# Patient Record
Sex: Female | Born: 1935 | Race: White | Hispanic: No | Smoking: Former smoker
Health system: Southern US, Community
[De-identification: ages and names within clinical notes are randomized; demographics above are authoritative.]

## PROBLEM LIST (undated history)

## (undated) DIAGNOSIS — R42 Dizziness and giddiness: Secondary | ICD-10-CM

## (undated) DIAGNOSIS — B019 Varicella without complication: Secondary | ICD-10-CM

## (undated) DIAGNOSIS — E079 Disorder of thyroid, unspecified: Secondary | ICD-10-CM

## (undated) DIAGNOSIS — M5136 Other intervertebral disc degeneration, lumbar region: Secondary | ICD-10-CM

## (undated) DIAGNOSIS — R011 Cardiac murmur, unspecified: Secondary | ICD-10-CM

## (undated) DIAGNOSIS — M5412 Radiculopathy, cervical region: Secondary | ICD-10-CM

## (undated) DIAGNOSIS — G5600 Carpal tunnel syndrome, unspecified upper limb: Secondary | ICD-10-CM

## (undated) DIAGNOSIS — I00 Rheumatic fever without heart involvement: Secondary | ICD-10-CM

## (undated) DIAGNOSIS — H409 Unspecified glaucoma: Secondary | ICD-10-CM

## (undated) DIAGNOSIS — J189 Pneumonia, unspecified organism: Secondary | ICD-10-CM

## (undated) DIAGNOSIS — Z860100 Personal history of colon polyps, unspecified: Secondary | ICD-10-CM

## (undated) DIAGNOSIS — R55 Syncope and collapse: Secondary | ICD-10-CM

## (undated) DIAGNOSIS — I341 Nonrheumatic mitral (valve) prolapse: Secondary | ICD-10-CM

## (undated) DIAGNOSIS — E039 Hypothyroidism, unspecified: Secondary | ICD-10-CM

## (undated) DIAGNOSIS — M1712 Unilateral primary osteoarthritis, left knee: Secondary | ICD-10-CM

## (undated) DIAGNOSIS — Z9181 History of falling: Secondary | ICD-10-CM

## (undated) DIAGNOSIS — M810 Age-related osteoporosis without current pathological fracture: Secondary | ICD-10-CM

## (undated) DIAGNOSIS — T7840XA Allergy, unspecified, initial encounter: Secondary | ICD-10-CM

## (undated) DIAGNOSIS — M1990 Unspecified osteoarthritis, unspecified site: Secondary | ICD-10-CM

## (undated) DIAGNOSIS — D509 Iron deficiency anemia, unspecified: Secondary | ICD-10-CM

## (undated) DIAGNOSIS — I6529 Occlusion and stenosis of unspecified carotid artery: Secondary | ICD-10-CM

## (undated) DIAGNOSIS — J342 Deviated nasal septum: Secondary | ICD-10-CM

## (undated) DIAGNOSIS — J45909 Unspecified asthma, uncomplicated: Secondary | ICD-10-CM

## (undated) DIAGNOSIS — R32 Unspecified urinary incontinence: Secondary | ICD-10-CM

## (undated) DIAGNOSIS — H3530 Unspecified macular degeneration: Secondary | ICD-10-CM

## (undated) DIAGNOSIS — Z87440 Personal history of urinary (tract) infections: Secondary | ICD-10-CM

## (undated) DIAGNOSIS — Z961 Presence of intraocular lens: Secondary | ICD-10-CM

## (undated) DIAGNOSIS — I739 Peripheral vascular disease, unspecified: Secondary | ICD-10-CM

## (undated) DIAGNOSIS — E559 Vitamin D deficiency, unspecified: Secondary | ICD-10-CM

## (undated) DIAGNOSIS — R7611 Nonspecific reaction to tuberculin skin test without active tuberculosis: Secondary | ICD-10-CM

## (undated) HISTORY — DX: Occlusion and stenosis of unspecified carotid artery: I65.29

## (undated) HISTORY — DX: Vitamin D deficiency, unspecified: E55.9

## (undated) HISTORY — DX: Personal history of colon polyps, unspecified: Z86.0100

## (undated) HISTORY — DX: Unspecified glaucoma: H40.9

## (undated) HISTORY — DX: Iron deficiency anemia, unspecified: D50.9

## (undated) HISTORY — DX: Age-related osteoporosis without current pathological fracture: M81.0

## (undated) HISTORY — DX: Unspecified asthma, uncomplicated: J45.909

## (undated) HISTORY — DX: Hypothyroidism, unspecified: E03.9

## (undated) HISTORY — DX: Deviated nasal septum: J34.2

## (undated) HISTORY — DX: Disorder of thyroid, unspecified: E07.9

## (undated) HISTORY — DX: Nonrheumatic mitral (valve) prolapse: I34.1

## (undated) HISTORY — DX: Cardiac murmur, unspecified: R01.1

## (undated) HISTORY — DX: Allergy, unspecified, initial encounter: T78.40XA

## (undated) HISTORY — DX: Unilateral primary osteoarthritis, left knee: M17.12

## (undated) HISTORY — DX: Unspecified macular degeneration: H35.30

## (undated) HISTORY — DX: Carpal tunnel syndrome, unspecified upper limb: G56.00

## (undated) HISTORY — DX: Pneumonia, unspecified organism: J18.9

## (undated) HISTORY — DX: Syncope and collapse: R55

## (undated) HISTORY — DX: Unspecified osteoarthritis, unspecified site: M19.90

## (undated) HISTORY — DX: Nonspecific reaction to tuberculin skin test without active tuberculosis: R76.11

## (undated) HISTORY — DX: Rheumatic fever without heart involvement: I00

## (undated) HISTORY — DX: History of falling: Z91.81

## (undated) HISTORY — DX: Personal history of urinary (tract) infections: Z87.440

## (undated) HISTORY — DX: Unspecified urinary incontinence: R32

## (undated) HISTORY — DX: Peripheral vascular disease, unspecified: I73.9

## (undated) HISTORY — DX: Varicella without complication: B01.9

## (undated) HISTORY — DX: Radiculopathy, cervical region: M54.12

## (undated) HISTORY — DX: Other intervertebral disc degeneration, lumbar region: M51.36

## (undated) HISTORY — DX: Presence of intraocular lens: Z96.1

## (undated) HISTORY — DX: Personal history of colonic polyps: Z86.010

## (undated) HISTORY — DX: Dizziness and giddiness: R42

---

## 1994-02-22 HISTORY — PX: WRIST FRACTURE SURGERY: SHX121

## 1995-02-23 HISTORY — PX: CATARACT EXTRACTION: SUR2

## 2005-02-22 HISTORY — PX: NASAL SEPTUM SURGERY: SHX37

## 2008-02-23 HISTORY — PX: OTHER SURGICAL HISTORY: SHX169

## 2008-02-23 HISTORY — PX: COLONOSCOPY: SHX174

## 2011-08-27 DIAGNOSIS — I059 Rheumatic mitral valve disease, unspecified: Secondary | ICD-10-CM | POA: Insufficient documentation

## 2011-08-27 DIAGNOSIS — I341 Nonrheumatic mitral (valve) prolapse: Secondary | ICD-10-CM

## 2011-08-27 HISTORY — DX: Nonrheumatic mitral (valve) prolapse: I34.1

## 2012-10-17 DIAGNOSIS — I6529 Occlusion and stenosis of unspecified carotid artery: Secondary | ICD-10-CM | POA: Insufficient documentation

## 2012-10-17 DIAGNOSIS — R011 Cardiac murmur, unspecified: Secondary | ICD-10-CM

## 2012-10-17 HISTORY — DX: Occlusion and stenosis of unspecified carotid artery: I65.29

## 2012-10-17 HISTORY — DX: Cardiac murmur, unspecified: R01.1

## 2015-04-26 DIAGNOSIS — M51369 Other intervertebral disc degeneration, lumbar region without mention of lumbar back pain or lower extremity pain: Secondary | ICD-10-CM

## 2015-04-26 DIAGNOSIS — M5136 Other intervertebral disc degeneration, lumbar region: Secondary | ICD-10-CM

## 2015-04-26 HISTORY — DX: Other intervertebral disc degeneration, lumbar region without mention of lumbar back pain or lower extremity pain: M51.369

## 2015-04-26 HISTORY — DX: Other intervertebral disc degeneration, lumbar region: M51.36

## 2015-05-16 DIAGNOSIS — M5412 Radiculopathy, cervical region: Secondary | ICD-10-CM

## 2015-05-16 HISTORY — DX: Radiculopathy, cervical region: M54.12

## 2016-02-23 DIAGNOSIS — G5600 Carpal tunnel syndrome, unspecified upper limb: Secondary | ICD-10-CM

## 2016-02-23 HISTORY — DX: Carpal tunnel syndrome, unspecified upper limb: G56.00

## 2016-03-08 DIAGNOSIS — J4530 Mild persistent asthma, uncomplicated: Secondary | ICD-10-CM | POA: Diagnosis not present

## 2016-03-08 DIAGNOSIS — J301 Allergic rhinitis due to pollen: Secondary | ICD-10-CM | POA: Diagnosis not present

## 2016-03-11 DIAGNOSIS — J45909 Unspecified asthma, uncomplicated: Secondary | ICD-10-CM | POA: Diagnosis not present

## 2016-03-11 DIAGNOSIS — M1990 Unspecified osteoarthritis, unspecified site: Secondary | ICD-10-CM | POA: Diagnosis not present

## 2016-03-11 DIAGNOSIS — I6523 Occlusion and stenosis of bilateral carotid arteries: Secondary | ICD-10-CM | POA: Diagnosis not present

## 2016-03-11 DIAGNOSIS — R252 Cramp and spasm: Secondary | ICD-10-CM | POA: Diagnosis not present

## 2016-05-04 DIAGNOSIS — R011 Cardiac murmur, unspecified: Secondary | ICD-10-CM | POA: Diagnosis not present

## 2016-05-04 DIAGNOSIS — Z87891 Personal history of nicotine dependence: Secondary | ICD-10-CM | POA: Diagnosis not present

## 2016-05-04 DIAGNOSIS — R42 Dizziness and giddiness: Secondary | ICD-10-CM | POA: Diagnosis not present

## 2016-05-04 DIAGNOSIS — M5412 Radiculopathy, cervical region: Secondary | ICD-10-CM | POA: Diagnosis not present

## 2016-05-04 DIAGNOSIS — Z6826 Body mass index (BMI) 26.0-26.9, adult: Secondary | ICD-10-CM | POA: Diagnosis not present

## 2016-05-11 DIAGNOSIS — H401131 Primary open-angle glaucoma, bilateral, mild stage: Secondary | ICD-10-CM | POA: Diagnosis not present

## 2016-05-11 DIAGNOSIS — H353130 Nonexudative age-related macular degeneration, bilateral, stage unspecified: Secondary | ICD-10-CM | POA: Diagnosis not present

## 2016-05-11 DIAGNOSIS — Z961 Presence of intraocular lens: Secondary | ICD-10-CM | POA: Diagnosis not present

## 2016-05-12 DIAGNOSIS — R209 Unspecified disturbances of skin sensation: Secondary | ICD-10-CM | POA: Diagnosis not present

## 2016-05-12 DIAGNOSIS — R42 Dizziness and giddiness: Secondary | ICD-10-CM | POA: Diagnosis not present

## 2016-05-12 DIAGNOSIS — M5412 Radiculopathy, cervical region: Secondary | ICD-10-CM | POA: Diagnosis not present

## 2016-06-01 DIAGNOSIS — R209 Unspecified disturbances of skin sensation: Secondary | ICD-10-CM | POA: Diagnosis not present

## 2016-06-01 DIAGNOSIS — R42 Dizziness and giddiness: Secondary | ICD-10-CM | POA: Diagnosis not present

## 2016-06-01 DIAGNOSIS — M5412 Radiculopathy, cervical region: Secondary | ICD-10-CM | POA: Diagnosis not present

## 2016-06-09 DIAGNOSIS — N390 Urinary tract infection, site not specified: Secondary | ICD-10-CM | POA: Diagnosis not present

## 2016-06-28 DIAGNOSIS — Z6826 Body mass index (BMI) 26.0-26.9, adult: Secondary | ICD-10-CM | POA: Diagnosis not present

## 2016-06-28 DIAGNOSIS — Z87891 Personal history of nicotine dependence: Secondary | ICD-10-CM | POA: Diagnosis not present

## 2016-06-28 DIAGNOSIS — M5412 Radiculopathy, cervical region: Secondary | ICD-10-CM | POA: Diagnosis not present

## 2016-06-28 DIAGNOSIS — L089 Local infection of the skin and subcutaneous tissue, unspecified: Secondary | ICD-10-CM | POA: Diagnosis not present

## 2016-06-28 DIAGNOSIS — W57XXXA Bitten or stung by nonvenomous insect and other nonvenomous arthropods, initial encounter: Secondary | ICD-10-CM | POA: Diagnosis not present

## 2016-10-04 DIAGNOSIS — N390 Urinary tract infection, site not specified: Secondary | ICD-10-CM | POA: Diagnosis not present

## 2016-10-11 DIAGNOSIS — R69 Illness, unspecified: Secondary | ICD-10-CM | POA: Diagnosis not present

## 2016-10-11 DIAGNOSIS — M654 Radial styloid tenosynovitis [de Quervain]: Secondary | ICD-10-CM | POA: Diagnosis not present

## 2016-10-11 DIAGNOSIS — Z6825 Body mass index (BMI) 25.0-25.9, adult: Secondary | ICD-10-CM | POA: Diagnosis not present

## 2016-10-11 DIAGNOSIS — N390 Urinary tract infection, site not specified: Secondary | ICD-10-CM | POA: Diagnosis not present

## 2016-10-15 DIAGNOSIS — N390 Urinary tract infection, site not specified: Secondary | ICD-10-CM | POA: Diagnosis not present

## 2016-11-18 DIAGNOSIS — G5602 Carpal tunnel syndrome, left upper limb: Secondary | ICD-10-CM | POA: Diagnosis not present

## 2016-12-08 DIAGNOSIS — G5602 Carpal tunnel syndrome, left upper limb: Secondary | ICD-10-CM | POA: Diagnosis not present

## 2016-12-10 DIAGNOSIS — R69 Illness, unspecified: Secondary | ICD-10-CM | POA: Diagnosis not present

## 2016-12-16 DIAGNOSIS — Z4789 Encounter for other orthopedic aftercare: Secondary | ICD-10-CM | POA: Diagnosis not present

## 2016-12-16 DIAGNOSIS — G5602 Carpal tunnel syndrome, left upper limb: Secondary | ICD-10-CM | POA: Diagnosis not present

## 2016-12-16 DIAGNOSIS — M19032 Primary osteoarthritis, left wrist: Secondary | ICD-10-CM | POA: Diagnosis not present

## 2017-01-05 DIAGNOSIS — L909 Atrophic disorder of skin, unspecified: Secondary | ICD-10-CM | POA: Diagnosis not present

## 2017-01-05 DIAGNOSIS — Z87891 Personal history of nicotine dependence: Secondary | ICD-10-CM | POA: Diagnosis not present

## 2017-02-22 HISTORY — PX: CARPAL TUNNEL RELEASE: SHX101

## 2017-03-08 DIAGNOSIS — L570 Actinic keratosis: Secondary | ICD-10-CM | POA: Diagnosis not present

## 2017-04-19 DIAGNOSIS — Z961 Presence of intraocular lens: Secondary | ICD-10-CM | POA: Diagnosis not present

## 2017-04-19 DIAGNOSIS — H401131 Primary open-angle glaucoma, bilateral, mild stage: Secondary | ICD-10-CM | POA: Diagnosis not present

## 2017-04-19 DIAGNOSIS — H353130 Nonexudative age-related macular degeneration, bilateral, stage unspecified: Secondary | ICD-10-CM | POA: Diagnosis not present

## 2017-04-20 DIAGNOSIS — D225 Melanocytic nevi of trunk: Secondary | ICD-10-CM | POA: Diagnosis not present

## 2017-04-20 DIAGNOSIS — L814 Other melanin hyperpigmentation: Secondary | ICD-10-CM | POA: Diagnosis not present

## 2017-04-20 DIAGNOSIS — Z08 Encounter for follow-up examination after completed treatment for malignant neoplasm: Secondary | ICD-10-CM | POA: Diagnosis not present

## 2017-04-20 DIAGNOSIS — Z85828 Personal history of other malignant neoplasm of skin: Secondary | ICD-10-CM | POA: Diagnosis not present

## 2017-04-20 DIAGNOSIS — L570 Actinic keratosis: Secondary | ICD-10-CM | POA: Diagnosis not present

## 2017-05-12 DIAGNOSIS — H04123 Dry eye syndrome of bilateral lacrimal glands: Secondary | ICD-10-CM | POA: Diagnosis not present

## 2017-05-12 DIAGNOSIS — H401131 Primary open-angle glaucoma, bilateral, mild stage: Secondary | ICD-10-CM | POA: Diagnosis not present

## 2017-05-12 DIAGNOSIS — Z961 Presence of intraocular lens: Secondary | ICD-10-CM | POA: Diagnosis not present

## 2017-05-12 DIAGNOSIS — H353130 Nonexudative age-related macular degeneration, bilateral, stage unspecified: Secondary | ICD-10-CM | POA: Diagnosis not present

## 2017-05-17 DIAGNOSIS — R5383 Other fatigue: Secondary | ICD-10-CM | POA: Diagnosis not present

## 2017-05-17 DIAGNOSIS — Z87891 Personal history of nicotine dependence: Secondary | ICD-10-CM | POA: Diagnosis not present

## 2017-05-17 DIAGNOSIS — Z9181 History of falling: Secondary | ICD-10-CM | POA: Diagnosis not present

## 2017-05-17 DIAGNOSIS — R55 Syncope and collapse: Secondary | ICD-10-CM

## 2017-05-17 HISTORY — DX: Syncope and collapse: R55

## 2017-05-19 DIAGNOSIS — I341 Nonrheumatic mitral (valve) prolapse: Secondary | ICD-10-CM | POA: Diagnosis not present

## 2017-05-19 DIAGNOSIS — R7309 Other abnormal glucose: Secondary | ICD-10-CM | POA: Diagnosis not present

## 2017-05-19 DIAGNOSIS — R55 Syncope and collapse: Secondary | ICD-10-CM | POA: Diagnosis not present

## 2017-05-19 DIAGNOSIS — D649 Anemia, unspecified: Secondary | ICD-10-CM | POA: Diagnosis not present

## 2017-05-19 DIAGNOSIS — R5383 Other fatigue: Secondary | ICD-10-CM | POA: Diagnosis not present

## 2017-05-19 DIAGNOSIS — Z136 Encounter for screening for cardiovascular disorders: Secondary | ICD-10-CM | POA: Diagnosis not present

## 2017-05-19 DIAGNOSIS — E559 Vitamin D deficiency, unspecified: Secondary | ICD-10-CM | POA: Diagnosis not present

## 2017-05-19 LAB — CBC AND DIFFERENTIAL
Hemoglobin: 12.1 (ref 12.0–16.0)
Platelets: 196 (ref 150–399)
WBC: 4.3

## 2017-05-19 LAB — BASIC METABOLIC PANEL
BUN: 15 (ref 4–21)
Creatinine: 0.8 (ref <=1.1)
Glucose: 93
Potassium: 4.3 (ref 3.4–5.3)
Sodium: 143 (ref 137–147)

## 2017-05-19 LAB — VITAMIN D 25 HYDROXY (VIT D DEFICIENCY, FRACTURES): Vit D, 25-Hydroxy: 28.4

## 2017-05-19 LAB — IRON,TIBC AND FERRITIN PANEL: Iron: 40

## 2017-05-19 LAB — LIPID PANEL
Cholesterol: 217 — AB (ref 0–200)
HDL: 68 (ref 35–70)
LDL Cholesterol: 131

## 2017-08-08 DIAGNOSIS — D509 Iron deficiency anemia, unspecified: Secondary | ICD-10-CM | POA: Diagnosis not present

## 2017-08-08 DIAGNOSIS — E039 Hypothyroidism, unspecified: Secondary | ICD-10-CM | POA: Diagnosis not present

## 2017-08-08 LAB — TSH: TSH: 4.75 (ref 0.41–5.90)

## 2017-08-08 LAB — HEMOGLOBIN A1C: Hemoglobin A1C: 4.9

## 2017-08-08 LAB — IRON,TIBC AND FERRITIN PANEL: Iron: 58

## 2017-08-17 DIAGNOSIS — E039 Hypothyroidism, unspecified: Secondary | ICD-10-CM | POA: Diagnosis not present

## 2017-08-17 DIAGNOSIS — D509 Iron deficiency anemia, unspecified: Secondary | ICD-10-CM | POA: Diagnosis not present

## 2017-08-17 DIAGNOSIS — R5383 Other fatigue: Secondary | ICD-10-CM | POA: Diagnosis not present

## 2017-08-17 DIAGNOSIS — R011 Cardiac murmur, unspecified: Secondary | ICD-10-CM | POA: Diagnosis not present

## 2017-08-17 DIAGNOSIS — Z1331 Encounter for screening for depression: Secondary | ICD-10-CM | POA: Diagnosis not present

## 2017-08-17 DIAGNOSIS — Z87891 Personal history of nicotine dependence: Secondary | ICD-10-CM | POA: Diagnosis not present

## 2017-11-03 NOTE — Progress Notes (Signed)
[facility] Note  11/04/2017     CHIEF COMPLAINT Patient presents for Retina Evaluation   HISTORY OF PRESENT ILLNESS: [name] is a 84 y.o. female who presents to the clinic today for:   HPI    Retina Evaluation    In both eyes.  This started 4 months ago.  Associated Symptoms Photophobia.  Negative for Flashes, Pain, Trauma, Fever, Floaters, Redness, Scalp Tenderness, Weight Loss, Distortion, Jaw Claudication, Fatigue, Blind Spot, Glare and Shoulder/Hip pain.  Context:  distance vision, mid-range vision and near vision.  Treatments tried include laser and surgery.  Response to treatment was significant improvement.  I, the attending physician,  performed the HPI with the patient and updated documentation appropriately.          Comments    Self referral for retina eval. Patient states she has had fuzzy vision OD  and burning/ dry eyes for appx 4 months , she notices the dryness mostly with her sinus.  Pt reports she had cataract sx 1997 and laser tx ou early 2000's by Dr. Andris Baumann @ [facility] with significant improvement. Pt is using PF PRN, pt states the Dr that gave her her glasses rx is a glaucoma specialist, and states that she was dx with macular degeneration in 2011, but has never seen a retina specialist for it, pt just moved here from [place], she is very unhappy with her glasses rx and states she doesn't see as well as she think she should       Last edited by Bernarda Caffey, MD on 11/04/2017  2:16 PM. (History)    Pt states she recently moved to [place] from [place]; Pt states she found our practice and made an appointment herself; Pt states she "has macular degeneration" but has never seen a retina specialist; Pt states she has had cataract surgery OU ?accompanied with canalaplasty by Dr. Andris Baumann;   Referring physician: No referring provider defined for this encounter.  HISTORICAL INFORMATION:   Selected notes from the medical  record:  Self referral for macular degeneration LEE: 03.21.19 (Dr. Park Liter in [place], [place]) [BCVA: OD: 20/40 OS: 20/40] Ocular Hx-POAG, non-exu ARMD, DES, pseudo, YAG PMH-astham, arthritis,     CURRENT MEDICATIONS: Current Outpatient Medications (Ophthalmic Drugs)  Medication Sig  . prednisoLONE acetate (PRED FORTE) 1 % ophthalmic suspension    No current facility-administered medications for this visit.  (Ophthalmic Drugs)   Current Outpatient Medications (Other)  Medication Sig  . fluticasone (FLONASE) 50 MCG/ACT nasal spray   . levothyroxine (SYNTHROID, LEVOTHROID) 50 MCG tablet   . montelukast (SINGULAIR) 10 MG tablet    No current facility-administered medications for this visit.  (Other)      REVIEW OF SYSTEMS: ROS    Positive for: Eyes   Negative for: Constitutional, Gastrointestinal, Neurological, Skin, Genitourinary, Musculoskeletal, HENT, Endocrine, Cardiovascular, Respiratory, Psychiatric, Allergic/Imm, Heme/Lymph   Last edited by [name], LPN on 05/18/7122  5:80 PM. (History)       ALLERGIES Allergies not on file  PAST MEDICAL HISTORY Past Medical History:  Diagnosis Date  . Heart murmur   . Hypothyroidism    Past Surgical History:  Procedure Laterality Date  . CATARACT EXTRACTION    . EYE SURGERY    . HAND SURGERY    . WRIST FRACTURE SURGERY      FAMILY HISTORY Family History  Problem Relation Age of Onset  . Cancer Father   . Blindness Maternal Grandmother     SOCIAL  HISTORY Social History   Tobacco Use  . Smoking status: Former Research scientist (life sciences)  . Smokeless tobacco: Never Used  Substance Use Topics  . Alcohol use: Yes    Alcohol/week: 1.0 standard drinks    Types: 1 Glasses of wine per week  . Drug use: Never         OPHTHALMIC EXAM:  Base Eye Exam    Visual Acuity (Snellen - Linear)      Right Left   Dist cc 20/50 -1 20/40   Dist ph cc 20/40 20/40 +2   Correction:  Glasses       Tonometry (Applanation, 2:29 PM)      Right  Left   Pressure 15 15  TA done by Dr. Coralyn Pear       Pupils      Dark Light Shape React APD   Right 3 2 Round Brisk None   Left 3 2 Round Brisk None       Visual Fields (Counting fingers)      Left Right    Full Full       Extraocular Movement      Right Left    Full, Ortho Full, Ortho       Neuro/Psych    Oriented x3:  Yes   Mood/Affect:  Normal       Dilation    Both eyes:  1.0% Mydriacyl, 2.5% Phenylephrine @ 1:30 PM        Slit Lamp and Fundus Exam    Slit Lamp Exam      Right Left   Lids/Lashes Dermatochalasis - upper lid, Telangiectasia Dermatochalasis - upper lid, Telangiectasia   Conjunctiva/Sclera Temporal Pinguecula White and quiet   Cornea Arcus, 1-2+ Punctate epithelial erosions Arcus, 1-2+ Punctate epithelial erosions   Anterior Chamber Deep and quiet Deep and quiet   Iris Round and dilated Round and dilated   Lens PC IOL in good position with open PC PC IOL in good position with open PC   Vitreous Vitreous syneresis Vitreous syneresis       Fundus Exam      Right Left   Disc Almost 360 Peripapillary atrophy Peripapillary atrophy, difficult to asess rim   C/D Ratio 0.6 0.4   Macula Flat, Blunted foveal reflex, RPE mottling and clumping, Drusen Flat, Blunted foveal reflex, RPE mottling and clumping, Drusen   Vessels Vascular attenuation Vascular attenuation   Periphery Attached, scattered Reticular degeneration Attached, scattered Reticular degeneration        Refraction    Wearing Rx      Sphere Cylinder Axis Add   Right -0.75 +1.75 172 +3.00   Left -0.50 +2.00 170 +3.00       Manifest Refraction      Sphere Cylinder Axis Dist VA   Right -1.50 +2.75 172 20/40   Left -0.50 +2.00 140 20/60+2          IMAGING AND PROCEDURES  Imaging and Procedures for @TODAY @  OCT, Retina - OU - Both Eyes       Right Eye Quality was good. Central Foveal Thickness: 305. Progression has no prior data. Findings include normal foveal contour, no IRF,  no SRF, retinal drusen , outer retinal atrophy (Trace ERM).   Left Eye Quality was good. Central Foveal Thickness: 305. Progression has no prior data. Findings include normal foveal contour, no IRF, no SRF, retinal drusen , outer retinal atrophy (Trace ERM).   Notes *Images captured and stored on drive  Diagnosis / Impression:  Non-Exudative ARMD OU  Clinical management:  See below  Abbreviations: NFP - Normal foveal profile. CME - cystoid macular edema. PED - pigment epithelial detachment. IRF - intraretinal fluid. SRF - subretinal fluid. EZ - ellipsoid zone. ERM - epiretinal membrane. ORA - outer retinal atrophy. ORT - outer retinal tubulation. SRHM - subretinal hyper-reflective material                  ASSESSMENT/PLAN:    ICD-10-CM   1. Intermediate stage nonexudative age-related macular degeneration of both eyes H35.3132   2. Retinal edema H35.81 OCT, Retina - OU - Both Eyes  3. Pseudophakia of both eyes Z96.1   4. Primary open angle glaucoma (POAG) of both eyes, mild stage H40.1131     1. Age related macular degeneration, non-exudative, both eyes  - mild-intermediate stage  - The incidence, anatomy, and pathology of dry AMD, risk of progression, and the AREDS and AREDS 2 study including smoking risks discussed with patient.  - Recommend amsler grid monitoring  - f/u 3-4 months  2. No retinal edema on exam or OCT  3. Pseudophakia OU  - s/p CE/IOL w/ ?canaloplasty OU by Dr. Robyne Askew at [facility]  - doing well  - monitor  4. History of Glaucoma-  - not currently on gtts -- IOP 15 - ?underwent canaloplasty in conjunction with CEIOL at [facility] - will refer to Dr. Read Drivers for glaucoma eval and MRx (new to the area)  Ophthalmic Meds Ordered this visit:  No orders of the defined types were placed in this encounter.      Return in about 4 months (around 03/06/2018) for F/U Non-Exu AMD, DFE, OCT.  There are no Patient Instructions on file for  this visit.   Explained the diagnoses, plan, and follow up with the patient and they expressed understanding.  Patient expressed understanding of the importance of proper follow up care.   This document serves as a record of services personally performed by Gardiner Sleeper, MD, PhD. It was created on their behalf by Ernest Mallick, OA, an ophthalmic assistant. The creation of this record is the provider's dictation and/or activities during the visit.    Electronically signed by: Ernest Mallick, OA  09.12.2019 12:52 AM   This document serves as a record of services personally performed by Gardiner Sleeper, MD, PhD. It was created on their behalf by Catha Brow, [place], a certified ophthalmic assistant. The creation of this record is the provider's dictation and/or activities during the visit.  Electronically signed by: Catha Brow, [place]  09.13.19 12:52 AM    Gardiner Sleeper, M.D., Ph.D. Diseases & Surgery of the Retina and Vitreous [facility]   I have reviewed the above documentation for accuracy and completeness, and I agree with the above. Gardiner Sleeper, M.D., Ph.D. 11/06/17 12:52 AM    Abbreviations: M myopia (nearsighted); A astigmatism; H hyperopia (farsighted); P presbyopia; Mrx spectacle prescription;  CTL contact lenses; OD right eye; OS left eye; OU both eyes  XT exotropia; ET esotropia; PEK punctate epithelial keratitis; PEE punctate epithelial erosions; DES dry eye syndrome; MGD meibomian gland dysfunction; ATs artificial tears; PFAT's preservative free artificial tears; [place] nuclear sclerotic cataract; PSC posterior subcapsular cataract; ERM epi-retinal membrane; PVD posterior vitreous detachment; RD retinal detachment; DM diabetes mellitus; DR diabetic retinopathy; NPDR non-proliferative diabetic retinopathy; PDR proliferative diabetic retinopathy; CSME clinically significant macular edema; DME diabetic macular edema; dbh dot blot hemorrhages; CWS cotton  wool spot;  POAG primary open angle glaucoma; C/D cup-to-disc ratio; HVF humphrey visual field; GVF goldmann visual field; OCT optical coherence tomography; IOP intraocular pressure; BRVO Branch retinal vein occlusion; CRVO central retinal vein occlusion; CRAO central retinal artery occlusion; BRAO branch retinal artery occlusion; RT retinal tear; SB scleral buckle; PPV pars plana vitrectomy; VH Vitreous hemorrhage; PRP panretinal laser photocoagulation; IVK intravitreal kenalog; VMT vitreomacular traction; MH Macular hole;  NVD neovascularization of the disc; NVE neovascularization elsewhere; AREDS age related eye disease study; ARMD age related macular degeneration; POAG primary open angle glaucoma; EBMD epithelial/anterior basement membrane dystrophy; ACIOL anterior chamber intraocular lens; IOL intraocular lens; PCIOL posterior chamber intraocular lens; Phaco/IOL phacoemulsification with intraocular lens placement; [place] photorefractive keratectomy; LASIK laser assisted in situ keratomileusis; HTN hypertension; DM diabetes mellitus; COPD chronic obstructive pulmonary disease

## 2017-11-04 ENCOUNTER — Encounter (INDEPENDENT_AMBULATORY_CARE_PROVIDER_SITE_OTHER): Payer: Self-pay | Admitting: Ophthalmology

## 2017-11-04 ENCOUNTER — Ambulatory Visit (INDEPENDENT_AMBULATORY_CARE_PROVIDER_SITE_OTHER): Payer: Medicare HMO | Admitting: Ophthalmology

## 2017-11-04 DIAGNOSIS — H3581 Retinal edema: Secondary | ICD-10-CM | POA: Diagnosis not present

## 2017-11-04 DIAGNOSIS — H353132 Nonexudative age-related macular degeneration, bilateral, intermediate dry stage: Secondary | ICD-10-CM | POA: Diagnosis not present

## 2017-11-04 DIAGNOSIS — H401131 Primary open-angle glaucoma, bilateral, mild stage: Secondary | ICD-10-CM | POA: Diagnosis not present

## 2017-11-04 DIAGNOSIS — Z961 Presence of intraocular lens: Secondary | ICD-10-CM | POA: Diagnosis not present

## 2017-11-06 ENCOUNTER — Encounter (INDEPENDENT_AMBULATORY_CARE_PROVIDER_SITE_OTHER): Payer: Self-pay | Admitting: Ophthalmology

## 2017-11-24 DIAGNOSIS — H353132 Nonexudative age-related macular degeneration, bilateral, intermediate dry stage: Secondary | ICD-10-CM | POA: Diagnosis not present

## 2017-11-24 DIAGNOSIS — H40022 Open angle with borderline findings, high risk, left eye: Secondary | ICD-10-CM | POA: Diagnosis not present

## 2017-11-24 DIAGNOSIS — H401111 Primary open-angle glaucoma, right eye, mild stage: Secondary | ICD-10-CM | POA: Diagnosis not present

## 2017-12-14 DIAGNOSIS — R69 Illness, unspecified: Secondary | ICD-10-CM | POA: Diagnosis not present

## 2018-03-06 ENCOUNTER — Encounter (INDEPENDENT_AMBULATORY_CARE_PROVIDER_SITE_OTHER): Payer: Medicare HMO | Admitting: Ophthalmology

## 2018-03-20 ENCOUNTER — Encounter (INDEPENDENT_AMBULATORY_CARE_PROVIDER_SITE_OTHER): Payer: Medicare HMO | Admitting: Ophthalmology

## 2018-03-21 ENCOUNTER — Ambulatory Visit (INDEPENDENT_AMBULATORY_CARE_PROVIDER_SITE_OTHER): Payer: Medicare HMO | Admitting: Ophthalmology

## 2018-03-21 DIAGNOSIS — H3581 Retinal edema: Secondary | ICD-10-CM | POA: Diagnosis not present

## 2018-03-21 DIAGNOSIS — H353132 Nonexudative age-related macular degeneration, bilateral, intermediate dry stage: Secondary | ICD-10-CM

## 2018-03-21 DIAGNOSIS — H401131 Primary open-angle glaucoma, bilateral, mild stage: Secondary | ICD-10-CM

## 2018-03-21 DIAGNOSIS — Z961 Presence of intraocular lens: Secondary | ICD-10-CM | POA: Diagnosis not present

## 2018-03-21 NOTE — Progress Notes (Addendum)
[name] Note  03/21/2018     CHIEF COMPLAINT Patient presents for Retina Follow Up   HISTORY OF PRESENT ILLNESS: Margaret [name] is a 85 y.o. female who presents to the [name] today for:   HPI    Retina Follow Up    Patient presents with  Dry AMD.  In both eyes.  This started 4 months ago.  Severity is moderate.  Duration of 4 months.  Since onset it is stable.  I, the attending physician,  performed the HPI with the patient and updated documentation appropriately.          Comments    Patient here for 4 months follow up for non-exu ARMD. Patient states vision doing so so. No eye pain.        Last edited by Bernarda Caffey, MD on 03/21/2018  3:06 PM. (History)     Patient states eyes feel dry at times. Recommend use AT's as needed for dryness.   Referring physician: No referring provider defined for this encounter.  HISTORICAL INFORMATION:   Selected notes from the MEDICAL RECORD NUMBER Self referral for macular degeneration LEE: 03.21.19 (Dr. Park Liter in [place], [place]) [BCVA: OD: 20/40 OS: 20/40] Ocular Hx-POAG, non-exu ARMD, DES, pseudo, YAG PMH-astham, arthritis,     CURRENT MEDICATIONS: Current Outpatient Medications (Ophthalmic Drugs)  Medication Sig  . prednisoLONE acetate (PRED FORTE) 1 % ophthalmic suspension    No current facility-administered medications for this visit.  (Ophthalmic Drugs)   Current Outpatient Medications (Other)  Medication Sig  . fluticasone (FLONASE) 50 MCG/ACT nasal spray   . levothyroxine (SYNTHROID, LEVOTHROID) 50 MCG tablet   . montelukast (SINGULAIR) 10 MG tablet    No current facility-administered medications for this visit.  (Other)      REVIEW OF SYSTEMS: ROS    Positive for: Eyes   Negative for: Constitutional, Gastrointestinal, Neurological, Skin, Genitourinary, Musculoskeletal, HENT, Endocrine, Cardiovascular, Respiratory, Psychiatric, Allergic/Imm, Heme/Lymph   Last edited by Theodore Demark on 03/21/2018  2:05 PM. (History)       ALLERGIES Not on File  PAST MEDICAL HISTORY Past Medical History:  Diagnosis Date  . Heart murmur   . Hypothyroidism    Past Surgical History:  Procedure Laterality Date  . CATARACT EXTRACTION    . EYE SURGERY    . HAND SURGERY    . WRIST FRACTURE SURGERY      FAMILY HISTORY Family History  Problem Relation Age of Onset  . Cancer Father   . Blindness Maternal Grandmother     SOCIAL HISTORY Social History   Tobacco Use  . Smoking status: Former Research scientist (life sciences)  . Smokeless tobacco: Never Used  Substance Use Topics  . Alcohol use: Yes    Alcohol/week: 1.0 standard drinks    Types: 1 Glasses of wine per week  . Drug use: Never         OPHTHALMIC EXAM:  Base Eye Exam    Visual Acuity (Snellen - Linear)      Right Left   Dist cc 20/30 -1 20/30   Dist ph cc 20/25 -2 20/25 -2   Correction:  Glasses       Tonometry (Tonopen, 2:00 PM)      Right Left   Pressure 14 14       Pupils      Dark Light Shape React APD   Right 3 2 Round Brisk None   Left 3 2 Round Brisk None  Visual Fields (Counting fingers)      Left Right    Full Full       Extraocular Movement      Right Left    Full Full       Neuro/Psych    Oriented x3:  Yes   Mood/Affect:  Normal       Dilation    Both eyes:  1.0% Mydriacyl, 2.5% Phenylephrine @ 2:00 PM        Slit Lamp and Fundus Exam    Slit Lamp Exam      Right Left   Lids/Lashes Dermatochalasis - upper lid, Telangiectasia Dermatochalasis - upper lid, Telangiectasia   Conjunctiva/Sclera Temporal Pinguecula White and quiet   Cornea Arcus, 1-2+ Punctate epithelial erosions Arcus, 1-2+ Punctate epithelial erosions   Anterior Chamber Deep and quiet, narrow temporal angle Deep and quiet   Iris Round and dilated Round and dilated   Lens PC IOL in good position with open PC PC IOL in good position with open PC   Vitreous Vitreous syneresis Vitreous syneresis       Fundus Exam       Right Left   Disc Almost 360 Peripapillary atrophy Peripapillary atrophy, difficult to asess rim   C/D Ratio 0.6 0.4   Macula Flat, Blunted foveal reflex, RPE mottling and clumping, Drusen, no heme or edema Flat, Blunted foveal reflex, RPE mottling and clumping, Drusen, no heme or edema   Vessels Vascular attenuation, Tortuous Vascular attenuation, mildly tortous   Periphery Attached, scattered Reticular degeneration, pigmented cystoid degeneration temporally Attached, scattered Reticular degeneration        Refraction    Wearing Rx      Sphere Cylinder Axis Add   Right -0.75 +2.50 176 +2.50   Left +0.00 +1.25 166 +2.50   Age:  4m          IMAGING AND PROCEDURES  Imaging and Procedures for @TODAY @  OCT, Retina - OU - Both Eyes       Right Eye Quality was good. Central Foveal Thickness: 304. Progression has been stable. Findings include normal foveal contour, no IRF, no SRF, retinal drusen , outer retinal atrophy, epiretinal membrane (ERM).   Left Eye Quality was good. Central Foveal Thickness: 302. Progression has been stable. Findings include normal foveal contour, no IRF, no SRF, retinal drusen , outer retinal atrophy (Trace ERM).   Notes *Images captured and stored on drive  Diagnosis / Impression:  Non-Exudative ARMD OU--no change from prior  Clinical management:  See below  Abbreviations: NFP - Normal foveal profile. CME - cystoid macular edema. PED - pigment epithelial detachment. IRF - intraretinal fluid. SRF - subretinal fluid. EZ - ellipsoid zone. ERM - epiretinal membrane. ORA - outer retinal atrophy. ORT - outer retinal tubulation. SRHM - subretinal hyper-reflective material                  ASSESSMENT/PLAN:    ICD-10-CM   1. Intermediate stage nonexudative age-related macular degeneration of both eyes H35.3132   2. Retinal edema H35.81 OCT, Retina - OU - Both Eyes  3. Pseudophakia of both eyes Z96.1   4. Primary open angle glaucoma (POAG) of  both eyes, mild stage H40.1131     1. Age related macular degeneration, non-exudative, both eyes  - mild-intermediate stage-no change from previous visit  - The incidence, anatomy, and pathology of dry AMD, risk of progression, and the AREDS and AREDS 2 study including smoking risks discussed with patient.  - Recommend  amsler grid monitoring  - f/u 6-8 months-call sooner if changes in amsler grid  2. No retinal edema on exam or OCT  3. Pseudophakia OU  - s/p CE/IOL w/ ?canaloplasty OU by Dr. Robyne Askew at [facility]  - doing well  - monitor  4. History of Glaucoma-  - not currently on gtts -- IOP 14 OU - ?underwent canaloplasty in conjunction with CEIOL at [name] - referred to and now under the expert care of Dr. Kathlen Mody -- initial visit/consult 10.3.2019  5. Dry Eye Syndrome -use AT's tid OU  Ophthalmic Meds Ordered this visit:  No orders of the defined types were placed in this encounter.      Return 6-8 months, for Dilated exam, OCT.  There are no Patient Instructions on file for this visit.   Explained the diagnoses, plan, and follow up with the patient and they expressed understanding.  Patient expressed understanding of the importance of proper follow up care.   This document serves as a record of services personally performed by Gardiner Sleeper, MD, PhD. It was created on their behalf by Ernest Mallick, OA, an ophthalmic assistant. The creation of this record is the provider's dictation and/or activities during the visit.    Electronically signed by: Ernest Mallick, OA  01.28.2020 12:54 PM     Gardiner Sleeper, M.D., Ph.D. Diseases & Surgery of the Retina and Vitreous [facility]   I have reviewed the above documentation for accuracy and completeness, and I agree with the above. Gardiner Sleeper, M.D., Ph.D. 03/22/18 12:56 PM     Abbreviations: M myopia (nearsighted); A astigmatism; H hyperopia (farsighted); P presbyopia; Mrx  spectacle prescription;  CTL contact lenses; OD right eye; OS left eye; OU both eyes  XT exotropia; ET esotropia; PEK punctate epithelial keratitis; PEE punctate epithelial erosions; DES dry eye syndrome; MGD meibomian gland dysfunction; ATs artificial tears; PFAT's preservative free artificial tears; [place] nuclear sclerotic cataract; PSC posterior subcapsular cataract; ERM epi-retinal membrane; PVD posterior vitreous detachment; RD retinal detachment; DM diabetes mellitus; DR diabetic retinopathy; NPDR non-proliferative diabetic retinopathy; PDR proliferative diabetic retinopathy; CSME clinically significant macular edema; DME diabetic macular edema; dbh dot blot hemorrhages; CWS cotton wool spot; POAG primary open angle glaucoma; C/D cup-to-disc ratio; HVF humphrey visual field; GVF goldmann visual field; OCT optical coherence tomography; IOP intraocular pressure; BRVO Branch retinal vein occlusion; CRVO central retinal vein occlusion; CRAO central retinal artery occlusion; BRAO branch retinal artery occlusion; RT retinal tear; SB scleral buckle; PPV pars plana vitrectomy; VH Vitreous hemorrhage; PRP panretinal laser photocoagulation; IVK intravitreal kenalog; VMT vitreomacular traction; MH Macular hole;  NVD neovascularization of the disc; NVE neovascularization elsewhere; AREDS age related eye disease study; ARMD age related macular degeneration; POAG primary open angle glaucoma; EBMD epithelial/anterior basement membrane dystrophy; ACIOL anterior chamber intraocular lens; IOL intraocular lens; PCIOL posterior chamber intraocular lens; Phaco/IOL phacoemulsification with intraocular lens placement; [place] photorefractive keratectomy; LASIK laser assisted in situ keratomileusis; HTN hypertension; DM diabetes mellitus; COPD chronic obstructive pulmonary disease

## 2018-03-22 ENCOUNTER — Encounter (INDEPENDENT_AMBULATORY_CARE_PROVIDER_SITE_OTHER): Payer: Self-pay | Admitting: Ophthalmology

## 2018-03-29 ENCOUNTER — Ambulatory Visit: Payer: Medicare HMO | Admitting: Family Medicine

## 2018-03-29 ENCOUNTER — Encounter: Payer: Self-pay | Admitting: Family Medicine

## 2018-03-29 VITALS — BP 122/78 | HR 70 | Temp 97.8°F | Resp 16 | Ht 60.5 in | Wt 131.2 lb

## 2018-03-29 DIAGNOSIS — M6281 Muscle weakness (generalized): Secondary | ICD-10-CM | POA: Insufficient documentation

## 2018-03-29 DIAGNOSIS — Z0000 Encounter for general adult medical examination without abnormal findings: Secondary | ICD-10-CM

## 2018-03-29 DIAGNOSIS — M6289 Other specified disorders of muscle: Secondary | ICD-10-CM | POA: Diagnosis not present

## 2018-03-29 DIAGNOSIS — J45909 Unspecified asthma, uncomplicated: Secondary | ICD-10-CM | POA: Insufficient documentation

## 2018-03-29 DIAGNOSIS — E039 Hypothyroidism, unspecified: Secondary | ICD-10-CM | POA: Diagnosis not present

## 2018-03-29 DIAGNOSIS — G4762 Sleep related leg cramps: Secondary | ICD-10-CM | POA: Diagnosis not present

## 2018-03-29 DIAGNOSIS — M791 Myalgia, unspecified site: Secondary | ICD-10-CM | POA: Insufficient documentation

## 2018-03-29 DIAGNOSIS — E663 Overweight: Secondary | ICD-10-CM | POA: Diagnosis not present

## 2018-03-29 DIAGNOSIS — J4520 Mild intermittent asthma, uncomplicated: Secondary | ICD-10-CM | POA: Diagnosis not present

## 2018-03-29 DIAGNOSIS — J302 Other seasonal allergic rhinitis: Secondary | ICD-10-CM | POA: Diagnosis not present

## 2018-03-29 LAB — COMPREHENSIVE METABOLIC PANEL
ALT: 16 U/L (ref 0–35)
AST: 19 U/L (ref 0–37)
Albumin: 4.4 g/dL (ref 3.5–5.2)
Alkaline Phosphatase: 97 U/L (ref 39–117)
BUN: 14 mg/dL (ref 6–23)
CO2: 28 mEq/L (ref 19–32)
Calcium: 9.4 mg/dL (ref 8.4–10.5)
Chloride: 105 mEq/L (ref 96–112)
Creatinine, Ser: 0.67 mg/dL (ref 0.40–1.20)
GFR: 83.33 mL/min (ref >60.00)
Glucose, Bld: 89 mg/dL (ref 70–99)
Potassium: 4.5 mEq/L (ref 3.5–5.1)
Sodium: 141 mEq/L (ref 135–145)
Total Bilirubin: 0.7 mg/dL (ref 0.2–1.2)
Total Protein: 6.3 g/dL (ref 6.0–8.3)

## 2018-03-29 LAB — CBC
HCT: 39.1 % (ref 36.0–46.0)
Hemoglobin: 13.2 g/dL (ref 12.0–15.0)
MCHC: 33.7 g/dL (ref 30.0–36.0)
MCV: 90.2 fl (ref 78.0–100.0)
Platelets: 268 10*3/uL (ref 150.0–400.0)
RBC: 4.33 Mil/uL (ref 3.87–5.11)
RDW: 13 % (ref 11.5–15.5)
WBC: 9.3 10*3/uL (ref 4.0–10.5)

## 2018-03-29 LAB — SEDIMENTATION RATE: Sed Rate: 15 mm/hr (ref 0–30)

## 2018-03-29 LAB — T4, FREE: Free T4: 0.92 ng/dL (ref 0.60–1.60)

## 2018-03-29 LAB — TSH: TSH: 2.13 u[IU]/mL (ref 0.35–4.50)

## 2018-03-29 NOTE — Progress Notes (Signed)
Patient ID: Margaret [name], female  DOB: 1929/03/14, 85 y.o.   MRN: [id number] Patient Care Team    Relationship Specialty Notifications Start End  Ma Hillock, DO PCP - General Family Medicine  03/29/18   Bernarda Caffey, MD Consulting Physician Ophthalmology  03/29/18   Konrad Felix, MD Consulting Physician Ophthalmology  03/29/18     Chief Complaint  Patient presents with  . Establish Care    Not fasting. Pt has been having bilateral hip and arm pain x3 months. PCP Dr Larena Sox [facility] [place]     Subjective:  Margaret [name] is a 85 y.o.  female present for new patient establishment. All past medical history, surgical history, allergies, family history, immunizations, medications and social history were updated in the electronic medical record today. All recent labs, ED visits and hospitalizations within the last year were reviewed.  Muscle weakness of proximal extremity/myalgias: Patient reports over the last 2-3 months since December she has noticed development of bilateral proximal arm discomfort and bilateral hip thigh discomfort.  She reports the discomfort is worse after sitting still resting for some time and first thing in the morning.  She states it feels better after she gets up and starts moving approximately 1 to 2 hours after becoming active.  She has used Advil for the discomfort and that has been helpful.  She reports she moved to a new home in September and the cabinets are very high and she has had 2 region stretch more to work in her kitchen.  Otherwise she states she has not changed any activity.  She denies fever, chills or unintentional weight loss.  She reports discomfort is in the muscles, and not in her joints.  She does admit to some increased fatigue, but she thought that was secondary to her move.  She was started on levothyroxine 50 mcg around May, she does not recall any additional testing on her thyroid after starting medication.  He has some  osteoarthritis, but reports she gets around pretty good.  She enjoys gardening and being out in the yard.  She walks daily.  Hypothyroidism, unspecified type Patient reports around May 2019 she was started on levothyroxine 50 mcg daily.  She does not recall having any repeat labs after starting dose.  She moved to [place] in September 2019.  Nocturnal leg cramps Patient reports she has been prescribed Mirapex 0.25 mg nightly by her prior PCP for nocturnal leg cramping.  She feels this has worked well for her.  Seasonal allergies/Mild intermittent asthma, unspecified whether complicated Patient reports she has asthma and allergies.  Since starting the Singulair 10 mg daily by her prior PCP she has not needed the albuterol inhaler.   Depression screen [facility] 2/9 03/29/2018  Decreased Interest 0  Down, Depressed, Hopeless 0  PHQ - 2 Score 0   No flowsheet data found.   Current Exercise Habits: The patient does not participate in regular exercise at present   Fall Risk  03/29/2018  Falls in the past year? 1  Number falls in past yr: 0  Injury with Fall? 0  Risk for fall due to : Impaired vision  Follow up Education provided     Immunization History  Administered Date(s) Administered  . Influenza, High Dose Seasonal PF 12/14/2017  . Zoster 02/22/2005    No exam data present  Past Medical History:  Diagnosis Date  . Allergy   . Arthritis   . Asthma   . Chicken pox   .  Glaucoma    both eyes, mild opened angle  . Heart murmur   . History of colon polyps   . History of frequent urinary tract infections   . Hypothyroidism   . Macular degeneration    h/o retinal edema  . 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 Pseudophakia of both eyes   . Rheumatic fever    85 years old   . Thyroid disease   . Urinary incontinence    Allergies  Allergen Reactions  . Augmentin [Amoxicillin-Pot Clavulanate] Swelling and Rash    Swelling in face   Past Surgical History:  Procedure  Laterality Date  . CARPAL TUNNEL RELEASE Left 2019  . CATARACT EXTRACTION  1997  . colonoscopy   2010  . NASAL SEPTUM SURGERY  2007  . WRIST FRACTURE SURGERY  1996   Family History  Problem Relation Age of Onset  . Arthritis Mother   . COPD Mother   . Cancer Father   . Prostate cancer Father   . Arthritis Father   . Hearing loss Father   . Heart disease Father   . Blindness Maternal Grandmother   . Stroke Maternal Grandmother   . Liver cancer Sister   . Arthritis Brother   . Prostate cancer Brother   . COPD Brother   . Heart disease Brother   . Breast cancer Daughter   . Cancer Paternal Grandmother   . COPD Paternal Grandmother   . Breast cancer Paternal Grandmother    Social History   Social History Narrative  . Not on file    Allergies as of 03/29/2018      Reactions   Augmentin [amoxicillin-pot Clavulanate] Swelling, Rash   Swelling in face      Medication List       Accurate as of March 29, 2018 11:05 AM. Always use your most recent med list.        albuterol 108 (90 Base) MCG/ACT inhaler Commonly known as:  PROVENTIL HFA;VENTOLIN HFA Inhale 1-2 puffs into the lungs every 6 (six) hours as needed.   fluticasone 50 MCG/ACT nasal spray Commonly known as:  FLONASE   levothyroxine 50 MCG tablet Commonly known as:  SYNTHROID, LEVOTHROID   montelukast 10 MG tablet Commonly known as:  SINGULAIR   pramipexole 0.25 MG tablet Commonly known as:  MIRAPEX TK 1 T PO 1 HOUR BEFORE BEDTIME   prednisoLONE acetate 1 % ophthalmic suspension Commonly known as:  PRED FORTE   PRESERVISION AREDS 2 Caps Take by mouth.       All past medical history, surgical history, allergies, family history, immunizations andmedications were updated in the EMR today and reviewed under the history and medication portions of their EMR.    No results found for this or any previous visit (from the past 2160 hour(s)).  Patient was never admitted.   ROS: 14 pt review of systems  performed and negative (unless mentioned in an HPI)  Objective: BP 122/78 (BP Location: Right Arm, Patient Position: Sitting, Cuff Size: Normal)   Pulse 70   Temp 97.8 F (36.6 C) (Oral)   Resp 16   Ht 5' 0.5" (1.537 m)   Wt 131 lb 4 oz (59.5 kg)   SpO2 97%   BMI 25.21 kg/m  Gen: Afebrile. No acute distress. Nontoxic in appearance, well-developed, well-nourished,  Very pleasant caucasian female.  HENT: AT. [place]. MMM, no oral lesions, adequate dentition. Bilateral nares within normal limits. Throat without erythema, ulcerations or exudates.  no Cough on exam, no hoarseness on exam. Eyes:Pupils Equal Round Reactive to light, Extraocular movements intact,  Conjunctiva without redness, discharge or icterus. Neck/lymp/endocrine: Supple,no lymphadenopathy, no thyromegaly CV: RRR 2/6 SM, No edema, +2/4 P posterior tibialis pulses.  Chest: CTAB, no wheeze, rhonchi or crackles.  Abd: Soft. NTND. BS present Skin: no rashes, purpura or petechiae. Warm and well-perfused. Skin intact. Neuro/Msk: Normal gait. PERLA. EOMi. Alert. Oriented x3.  Tender to palpation bilateral deltoid, right tricep, right bicep tendon groove.  No tender to palpation bilateral bony prominence of hips.  Full range of motion in abduction of bilateral arms with bilateral discomfort after 90 degrees.  Negative empty can test, negative Hawkins, negative Yergason.  Cranial nerves II through XII intact. Muscle strength 5/5 upper/lower extremity.  Neurovascularly intact distally. Psych: Normal affect, dress and demeanor. Normal speech. Normal thought content and judgment.   Assessment/plan: Margaret [name] is a 85 y.o. female present for establish care- w ith acute issue.  Hypothyroidism, unspecified type - Uncertain if she has been retested since start of med 06/2017- she does not recall any additional test.  - TSH - T4, free  Myalgia/Muscle weakness of proximal extremity - Her strength is intact, she has mild tenderness with  palpation and ABDuction of bilateral arms after 90 degree- full ROM despite discomfort.  DDX: thyroid, PMR, overuse/tinnitus, arthritis, AA  - TSH - T4, free - Comp Met (CMET) - CBC - Sedimentation rate - C-reactive protein  Nocturnal leg cramps Started mirapex about 1 year ago, doing well.  Refills will be provided after lab results.  Seasonal allergies/Mild intermittent asthma, unspecified whether complicated - well controlled on singular 10 mg and albuterol PRN. She reports she rarely has to use inhaler now that she started Singulair.  Refills will be provided after lab results.   Pt will be called with lab results and further treatment plan made based on those results  Greater than 45 minutes was spent with patient, greater than 50% of that time was spent face-to-face with patient counseling, coordinating care, records review, establishing pt, addressing multiple chronic and acute concerns.     Note is dictated utilizing voice recognition software. Although note has been proof read prior to signing, occasional typographical errors still can be missed. If any questions arise, please do not hesitate to call for verification.  Electronically signed by: Howard Pouch, DO [place]

## 2018-03-29 NOTE — Patient Instructions (Addendum)
It was a pleasure to meet you today.  We will call you with lab results and make a plan based on the results.     Please help [place] help you:  We are honored you have chosen [facility] for your [facility] Care home. Below you will find basic instructions that you may need to access in the future. Please help [place] help you by reading the instructions, which cover many of the frequent questions we experience.   Prescription refills and request:  -In order to allow more efficient response time, please call your pharmacy for all refills. They will forward the request electronically to [place]. This allows for the quickest possible response. Request left on a nurse line can take longer to refill, since these are checked as time allows between office patients and other phone calls.  - refill request can take up to 3-5 working days to complete.  - If request is sent electronically and request is appropiate, it is usually completed in 1-2 business days.  - all patients will need to be seen routinely for all chronic medical conditions requiring prescription medications (see follow-up below). If you are overdue for follow up on your condition, you will be asked to make an appointment and we will call in enough medication to cover you until your appointment (up to 30 days).  - all controlled substances will require a face to face visit to request/refill.  - if you desire your prescriptions to go through a new pharmacy, and have an active script at original pharmacy, you will need to call your pharmacy and have scripts transferred to new pharmacy. This is completed between the pharmacy locations and not by your provider.    Results: If any images or labs were ordered, it can take up to 1 week to get results depending on the test ordered and the lab/facility running and resulting the test. - Normal or stable results, which do not need further discussion, may be released to your mychart immediately with attached note  to you. A call may not be generated for normal results. Please make certain to sign up for mychart. If you have questions on how to activate your mychart you can call the front office.  - If your results need further discussion, our office will attempt to contact you via phone, and if unable to reach you after 2 attempts, we will release your abnormal result to your mychart with instructions.  - All results will be automatically released in mychart after 1 week.  - Your provider will provide you with explanation and instruction on all relevant material in your results. Please keep in mind, results and labs may appear confusing or abnormal to the untrained eye, but it does not mean they are actually abnormal for you personally. If you have any questions about your results that are not covered, or you desire more detailed explanation than what was provided, you should make an appointment with your provider to do so.   Our office handles many outgoing and incoming calls daily. If we have not contacted you within 1 week about your results, please check your mychart to see if there is a message first and if not, then contact our office.  In helping with this matter, you help decrease call volume, and therefore allow [place] to be able to respond to patients needs more efficiently.   Acute office visits (sick visit):  An acute visit is intended for a new problem and are scheduled in  shorter time slots to allow schedule openings for patients with new problems. This is the appropriate visit to discuss a new problem. Problems will not be addressed by phone call or Echart message. Appointment is needed if requesting treatment. In order to provide you with excellent quality medical care with proper time for you to explain your problem, have an exam and receive treatment with instructions, these appointments should be limited to one new problem per visit. If you experience a new problem, in which you desire to be addressed,  please make an acute office visit, we save openings on the schedule to accommodate you. Please do not save your new problem for any other type of visit, let us take care of it properly and quickly for you.   Follow up visits:  Depending on your condition(s) your provider will need to see you routinely in order to provide you with quality care and prescribe medication(s). Most chronic conditions (Example: hypertension, Diabetes, depression/anxiety... etc), require visits a couple times a year. Your provider will instruct you on proper follow up for your personal medical conditions and history. Please make certain to make follow up appointments for your condition as instructed. Failing to do so could result in lapse in your medication treatment/refills. If you request a refill, and are overdue to be seen on a condition, we will always provide you with a 30 day script (once) to allow you time to schedule.    Medicare wellness (well visit): - we have a wonderful Nurse Maudie Mercury), that will meet with you and provide you will yearly medicare wellness visits. These visits should occur yearly (can not be scheduled less than 1 calendar year apart) and cover preventive health, immunizations, advance directives and screenings you are entitled to yearly through your medicare benefits. Do not miss out on your entitled benefits, this is when medicare will pay for these benefits to be ordered for you.  These are strongly encouraged by your provider and is the appropriate type of visit to make certain you are up to date with all preventive health benefits. If you have not had your medicare wellness exam in the last 12 months, please make certain to schedule one by calling the office and schedule your medicare wellness with Maudie Mercury as soon as possible.   Yearly physical (well visit):  - Adults are recommended to be seen yearly for physicals. Check with your insurance and date of your last physical, most insurances require one  calendar year between physicals. Physicals include all preventive health topics, screenings, medical exam and labs that are appropriate for gender/age and history. You may have fasting labs needed at this visit. This is a well visit (not a sick visit), new problems should not be covered during this visit (see acute visit).  - Pediatric patients are seen more frequently when they are younger. Your provider will advise you on well child visit timing that is appropriate for your their age. - This is not a medicare wellness visit. Medicare wellness exams do not have an exam portion to the visit. Some medicare companies allow for a physical, some do not allow a yearly physical. If your medicare allows a yearly physical you can schedule the medicare wellness with our nurse Maudie Mercury and have your physical with your provider after, on the same day. Please check with insurance for your full benefits.   Late Policy/No Shows:  - all new patients should arrive 15-30 minutes earlier than appointment to allow [place] time  to  obtain  all personal demographics,  insurance information and for you to complete office paperwork. - All established patients should arrive 10-15 minutes earlier than appointment time to update all information and be checked in .  - In our best efforts to run on time, if you are late for your appointment you will be asked to either reschedule or if able, we will work you back into the schedule. There will be a wait time to work you back in the schedule,  depending on availability.  - If you are unable to make it to your appointment as scheduled, please call 24 hours ahead of time to allow [place] to fill the time slot with someone else who needs to be seen. If you do not cancel your appointment ahead of time, you may be charged a no show fee.

## 2018-04-03 ENCOUNTER — Other Ambulatory Visit (INDEPENDENT_AMBULATORY_CARE_PROVIDER_SITE_OTHER): Payer: Medicare HMO

## 2018-04-03 ENCOUNTER — Telehealth: Payer: Self-pay | Admitting: Family Medicine

## 2018-04-03 ENCOUNTER — Telehealth: Payer: Self-pay

## 2018-04-03 DIAGNOSIS — M791 Myalgia, unspecified site: Secondary | ICD-10-CM

## 2018-04-03 DIAGNOSIS — M6289 Other specified disorders of muscle: Secondary | ICD-10-CM

## 2018-04-03 DIAGNOSIS — M6281 Muscle weakness (generalized): Secondary | ICD-10-CM

## 2018-04-03 LAB — C-REACTIVE PROTEIN: CRP: <1 mg/dL (ref 0.5–20.0)

## 2018-04-03 MED ORDER — MELOXICAM 7.5 MG PO TABS: 7.5000 mg | ORAL_TABLET | Freq: Every day | ORAL | 0 refills | Status: DC

## 2018-04-03 MED ORDER — MONTELUKAST SODIUM 10 MG PO TABS: 10.0000 mg | ORAL_TABLET | Freq: Every day | ORAL | 3 refills | Status: DC

## 2018-04-03 MED ORDER — LEVOTHYROXINE SODIUM 50 MCG PO TABS: 50.0000 ug | ORAL_TABLET | Freq: Every day | ORAL | 3 refills | Status: DC

## 2018-04-03 MED ORDER — ALBUTEROL SULFATE HFA 108 (90 BASE) MCG/ACT IN AERS: 2.0000 | INHALATION_SPRAY | Freq: Four times a day (QID) | RESPIRATORY_TRACT | 2 refills | Status: DC | PRN

## 2018-04-03 MED ORDER — PRAMIPEXOLE DIHYDROCHLORIDE 0.25 MG PO TABS: ORAL_TABLET | ORAL | 1 refills | Status: DC

## 2018-04-03 NOTE — Addendum Note (Signed)
Addended by: Ralph Dowdy on: 04/03/2018 08:12 AM   Modules accepted: Orders

## 2018-04-03 NOTE — Telephone Encounter (Signed)
Med prescribed.

## 2018-04-03 NOTE — Addendum Note (Signed)
Addended by: Howard Pouch A on: 04/03/2018 04:29 PM   Modules accepted: Orders

## 2018-04-03 NOTE — Telephone Encounter (Signed)
Please inform patient the following information: Labs are all normal.  Please apologize for the delay, it did take quite a few days to get on the labs returned. Her thyroid function is normal. No signs of anemia. Her inflammatory markers are normal. Her liver and kidneys are functioning normal.  I have refilled her Mirapex, Singulair, Synthroid and albuterol inhaler.  If she is still having discomfort we can try a low dose of mobic daily with food. This is an antiinflammatory. Avoid use of advil, naproxen, motrin with use- they are similar meds, but this is an everyday once a day. Please advise of her decision.

## 2018-04-03 NOTE — Telephone Encounter (Signed)
Pt was called and given information. She would like to try low dose Mobic daily. Pt given information on Mobic, to take with food, and to avoid NSAID use. Pt verbalized understanding on all instructions.

## 2018-04-03 NOTE — Telephone Encounter (Signed)
Called and LM for patient to return call.

## 2018-04-03 NOTE — Telephone Encounter (Signed)
Pt returning call , please call back  °

## 2018-04-06 NOTE — Telephone Encounter (Signed)
Pt requesting a call from [place] regarding below.

## 2018-04-06 NOTE — Telephone Encounter (Signed)
Called and spoke with pt. Pt had concerns that the new medication was a scheduled drug/pain killer which she does not want to try. Pt was educated on Meloxicam and that it was not a scheduled drug/steroid but a Anti inflammatory. Pt verbalized understanding and stated she was going to try the medication. Pt to call back with any further issues or concerns

## 2018-04-06 NOTE — Telephone Encounter (Signed)
Tried to call message and LM on VM for patient to return call

## 2018-04-14 ENCOUNTER — Encounter: Payer: Self-pay | Admitting: Family Medicine

## 2018-04-18 LAB — HEPATIC FUNCTION PANEL
ALT: 16 (ref 7–35)
ALT: 17 (ref 7–35)
AST: 19 (ref 13–35)
AST: 19 (ref 13–35)

## 2018-04-18 LAB — CBC AND DIFFERENTIAL
HCT: 39 (ref 36–46)
Hemoglobin: 13.2 (ref 12.0–16.0)
Hemoglobin: 13.2 (ref 12.0–16.0)
Platelets: 268 (ref 150–399)
Platelets: 268 (ref 150–399)
WBC: 9.3

## 2018-04-18 LAB — POCT ERYTHROCYTE SEDIMENTATION RATE, NON-AUTOMATED
Sed Rate: 15
Sed Rate: 15

## 2018-04-18 LAB — BASIC METABOLIC PANEL
Creatinine: 0.7 (ref 0.5–1.1)
Creatinine: 0.7 (ref <=1.1)

## 2018-04-27 ENCOUNTER — Encounter: Payer: Self-pay | Admitting: Family Medicine

## 2018-04-27 DIAGNOSIS — E559 Vitamin D deficiency, unspecified: Secondary | ICD-10-CM | POA: Insufficient documentation

## 2018-04-27 DIAGNOSIS — D509 Iron deficiency anemia, unspecified: Secondary | ICD-10-CM | POA: Insufficient documentation

## 2018-04-27 DIAGNOSIS — I739 Peripheral vascular disease, unspecified: Secondary | ICD-10-CM | POA: Insufficient documentation

## 2018-05-03 ENCOUNTER — Ambulatory Visit: Payer: Medicare HMO | Admitting: Family Medicine

## 2018-05-03 ENCOUNTER — Other Ambulatory Visit: Payer: Self-pay

## 2018-05-03 ENCOUNTER — Encounter: Payer: Self-pay | Admitting: Family Medicine

## 2018-05-03 VITALS — BP 120/72 | HR 75 | Temp 97.9°F | Resp 16 | Ht 61.0 in | Wt 132.2 lb

## 2018-05-03 DIAGNOSIS — M6289 Other specified disorders of muscle: Secondary | ICD-10-CM

## 2018-05-03 DIAGNOSIS — M791 Myalgia, unspecified site: Secondary | ICD-10-CM | POA: Diagnosis not present

## 2018-05-03 DIAGNOSIS — W57XXXA Bitten or stung by nonvenomous insect and other nonvenomous arthropods, initial encounter: Secondary | ICD-10-CM | POA: Diagnosis not present

## 2018-05-03 DIAGNOSIS — M2550 Pain in unspecified joint: Secondary | ICD-10-CM | POA: Insufficient documentation

## 2018-05-03 MED ORDER — MELOXICAM 7.5 MG PO TABS: 7.5000 mg | ORAL_TABLET | Freq: Every day | ORAL | 3 refills | Status: DC

## 2018-05-03 NOTE — Patient Instructions (Addendum)
2 extra strength tylenol every morning will help with arthritis.   You can also continue the mobic  provided before bed.     Arthritis Arthritis means joint pain. It can also mean joint disease. A joint is a place where bones come together. People who have arthritis may have:  Red joints.  Swollen joints.  Stiff joints.  Warm joints.  A fever.  A feeling of being sick. Follow these instructions at home: Pay attention to any changes in your symptoms. Take these actions to help with your pain and swelling. Medicines  Take over-the-counter and prescription medicines only as told by your doctor.  Do not take aspirin for pain if your doctor says that you may have gout. Activity  Rest your joint if your doctor tells you to.  Avoid activities that make the pain worse.  Exercise your joint regularly as told by your doctor. Try doing exercises like: ? Swimming. ? Water aerobics. ? Biking. ? Walking. Joint Care   If your joint is swollen, keep it raised (elevated) if told by your doctor.  If your joint feels stiff in the morning, try taking a warm shower.  If you have diabetes, do not apply heat without asking your doctor.  If told, apply heat to the joint: ? Put a towel between the joint and the hot pack or heating pad. ? Leave the heat on the area for 20-30 minutes.  If told, apply ice to the joint: ? Put ice in a plastic bag. ? Place a towel between your skin and the bag. ? Leave the ice on for 20 minutes, 2-3 times per day.  Keep all follow-up visits as told by your doctor. Contact a doctor if:  The pain gets worse.  You have a fever. Get help right away if:  You have very bad pain in your joint.  You have swelling in your joint.  Your joint is red.  Many joints become painful and swollen.  You have very bad back pain.  Your leg is very weak.  You cannot control your pee (urine) or poop (stool). This information is not intended to replace advice  given to you by your health care provider. Make sure you discuss any questions you have with your health care provider. Document Released: 05/05/2009 Document Revised: 07/17/2015 Document Reviewed: 05/06/2014 Elsevier Interactive Patient Education  2019 [employer].

## 2018-05-03 NOTE — Progress Notes (Signed)
Patient ID: Margaret [name], female  DOB: Jun 29, 1929, 85 y.o.   MRN: [id number] Patient Care Team    Relationship Specialty Notifications Start End  Ma Hillock, DO PCP - General Family Medicine  03/29/18   Bernarda Caffey, MD Consulting Physician Ophthalmology  03/29/18   Konrad Felix, MD Consulting Physician Ophthalmology  03/29/18     Chief Complaint  Patient presents with  . Follow-up    Pt complains of joint and muscle pain x2-3 months. Worse in the mornings when she wakes up and when she is going to bed     Subjective:  Margaret [name] is a 85 y.o.  female present for follow up on muscle complaints.   Muscle weakness of proximal extremity/myalgias:  Pt reports she is still experiencing arthralgias and myalgias of her left hand, upper arms and upper legs. Her CMP. CRP, ESR, TSH and CBC was normal. She was started on low dose mobic which states has been mildly helpful, but her pain still persist. The pain is worse in the morning and in the evening (after her nap). She has arthritis in her family (unknwn type). She also states she has tick exposure 2 years ago, was provided with an abx, but does not recall the type or length of the course. She was not tested later for lyme that she was aware of.  Prior note:  Patient reports over the last 2-3 months since December she has noticed development of bilateral proximal arm discomfort and bilateral hip thigh discomfort.  She reports the discomfort is worse after sitting still resting for some time and first thing in the morning.  She states it feels better after she gets up and starts moving approximately 1 to 2 hours after becoming active.  She has used Advil for the discomfort and that has been helpful.  She reports she moved to a new home in September and the cabinets are very high and she has had 2 region stretch more to work in her kitchen.  Otherwise she states she has not changed any activity.  She denies fever, chills or unintentional  weight loss.  She reports discomfort is in the muscles, and not in her joints.  She does admit to some increased fatigue, but she thought that was secondary to her move.  She was started on levothyroxine 50 mcg around May, she does not recall any additional testing on her thyroid after starting medication.  He has some osteoarthritis, but reports she gets around pretty good.  She enjoys gardening and being out in the yard.  She walks daily.   Depression screen [facility] 2/9 03/29/2018  Decreased Interest 0  Down, Depressed, Hopeless 0  PHQ - 2 Score 0   No flowsheet data found.     Fall Risk  03/29/2018  Falls in the past year? 1  Number falls in past yr: 0  Injury with Fall? 0  Risk for fall due to : Impaired vision  Follow up Education provided    Immunization History  Administered Date(s) Administered  . Influenza, High Dose Seasonal PF 12/14/2017  . Zoster 02/22/2005    No exam data present  Past Medical History:  Diagnosis Date  . Allergy   . Arthritis   . Arthritis of left knee   . Asthma   . Carotid artery stenosis 10/17/2012   stable report in 2018- "moderate on right, minimal on left"  . Carpal tunnel syndrome 2018   left.  . Cervical radiculopathy 05/16/2015  DDD C4-C5, C5-C6, and C6-C7.  Facet arthropathy throughout cervical spine.  Normal foraminal narrowing secondary to uncovertebral  arthropathy is seen bilaterally at C3-C4 and C5/C6  . Chicken pox   . DDD (degenerative disc disease), lumbar 04/26/2015   Mild levoscoliosis, right anterior listhesis of L4 and L5.  DDD at every level.  Facet arthropathy at multiple levels.  . Deviated septum   . Glaucoma    both eyes, mild opened angle  . Heart murmur   . History of colon polyps   . History of fall   . History of frequent urinary tract infections    klebs- pansensitive. c. freundii - resitant to cefazolin and augementin  . Hypothyroidism   . Iron deficiency anemia    prior pcp told her to take Fe 325 QD  .  Macular degeneration    h/o retinal edema  . Mitral valve prolapse 08/27/2011  . Murmur, cardiac 10/17/2012  . Osteoporosis   . 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 Pseudophakia of both eyes   . PVD (peripheral vascular disease) ([place])   . Rheumatic fever    85 years old   . Syncope 05/17/2017  . Thyroid disease   . Urinary incontinence   . Vertigo    had been prescribed antivert  . Vitamin D deficiency    Allergies  Allergen Reactions  . Augmentin [Amoxicillin-Pot Clavulanate] Swelling and Rash    Swelling in face   Past Surgical History:  Procedure Laterality Date  . CARPAL TUNNEL RELEASE Bilateral 2019  . CATARACT EXTRACTION  1997  . colonoscopy   2010  . NASAL SEPTUM SURGERY  2007  . WRIST FRACTURE SURGERY Right 1996   Family History  Problem Relation Age of Onset  . Arthritis Mother   . COPD Mother   . Prostate cancer Father   . Arthritis Father   . Hearing loss Father   . Heart disease Father   . Heart attack Father   . Blindness Maternal Grandmother   . Stroke Maternal Grandmother   . Liver cancer Sister   . Arthritis Brother   . Prostate cancer Brother   . COPD Brother   . Heart disease Brother   . Breast cancer Daughter   . COPD Paternal Grandmother   . Breast cancer Paternal Grandmother    Social History   Social History Narrative   Marital status/children/pets: Widowed.  Moved to [place] from [place] 2019.  She shares a home with 1 of her daughters.   Education/employment: Buyer, retail of arts degree, retired Marine scientist:      -smoke alarm in the home:Yes     - wears seatbelt: Yes     - Feels safe in their relationships: Yes    Allergies as of 05/03/2018      Reactions   Augmentin [amoxicillin-pot Clavulanate] Swelling, Rash   Swelling in face      Medication List       Accurate as of May 03, 2018  9:42 AM. Always use your most recent med list.        albuterol 108 (90 Base) MCG/ACT inhaler Commonly known as:  PROVENTIL  HFA;VENTOLIN HFA Inhale 2 puffs into the lungs every 6 (six) hours as needed for wheezing or shortness of breath.   fluticasone 50 MCG/ACT nasal spray Commonly known as:  FLONASE   levothyroxine 50 MCG tablet Commonly known as:  SYNTHROID, LEVOTHROID Take 1 tablet (50 mcg total) by mouth daily  before breakfast.   meloxicam 7.5 MG tablet Commonly known as:  MOBIC Take 1 tablet (7.5 mg total) by mouth daily.   montelukast 10 MG tablet Commonly known as:  SINGULAIR Take 1 tablet (10 mg total) by mouth at bedtime.   pramipexole 0.25 MG tablet Commonly known as:  MIRAPEX TK 1 T PO 1 HOUR BEFORE BEDTIME   prednisoLONE acetate 1 % ophthalmic suspension Commonly known as:  PRED FORTE   PreserVision AREDS 2 Caps Take by mouth.       All past medical history, surgical history, allergies, family history, immunizations andmedications were updated in the EMR today and reviewed under the history and medication portions of their EMR.     Patient was never admitted.   ROS: 14 pt review of systems performed and negative (unless mentioned in an HPI)  Objective: BP 120/72 (BP Location: Left Arm, Patient Position: Sitting, Cuff Size: Normal)   Pulse 75   Temp 97.9 F (36.6 C) (Oral)   Resp 16   Ht '5\' 1"'  (1.549 m)   Wt 132 lb 4 oz (60 kg)   SpO2 98%   BMI 24.99 kg/m  Gen: Afebrile. No acute distress.  HENT: AT. [place]. MMM. Neuro/MSK: Normal gait. PERLA. EOMi. Alert. Oriented x3. Still Tender to palpation bilateral deltoid, right tricep, right bicep tendon groove.  No tender to palpation bilateral bony prominence of hips.  Still Full range of motion in abduction of bilateral arms with bilateral discomfort after 90 degrees.   Prior exam: Negative empty can test, negative Hawkins, negative Yergason. 5/5 upper/lower extremity.  Bilateral hand DIP joint nodules present, with ulnar deformity left index.    Assessment/plan: Tresha Muzio is a 85 y.o. female present for  Myalgia/Muscle  weakness of proximal extremity - mostly likely OA, however will rule out autoimmune and LYME.  - consider CCP and RF if above normal.  - advised her to tale two extra strength tylenol in the morning daily.  - mobic 7.5 mg at night. Refilled today.  - try water aerobics- keep active and exercise. - F/U PRN   Note is dictated utilizing voice recognition software. Although note has been proof read prior to signing, occasional typographical errors still can be missed. If any questions arise, please do not hesitate to call for verification.  Electronically signed by: Howard Pouch, DO [place]

## 2018-05-03 NOTE — Addendum Note (Signed)
Addended by: Ralph Dowdy on: 05/03/2018 10:24 AM   Modules accepted: Orders

## 2018-05-08 ENCOUNTER — Telehealth: Payer: Self-pay | Admitting: Family Medicine

## 2018-05-08 DIAGNOSIS — M2550 Pain in unspecified joint: Secondary | ICD-10-CM

## 2018-05-08 DIAGNOSIS — M791 Myalgia, unspecified site: Secondary | ICD-10-CM

## 2018-05-08 DIAGNOSIS — R768 Other specified abnormal immunological findings in serum: Secondary | ICD-10-CM | POA: Insufficient documentation

## 2018-05-08 LAB — ANA,IFA RA DIAG PNL W/RFLX TIT/PATN
Anti Nuclear Antibody(ANA): POSITIVE — AB
Cyclic Citrullin Peptide Ab: <16 UNITS
Rheumatoid fact SerPl-aCnc: <14 IU/mL (ref <14)

## 2018-05-08 LAB — B. BURGDORFI ANTIBODIES: B burgdorferi Ab IgG+IgM: <0.9 index

## 2018-05-08 LAB — ANTI-NUCLEAR AB-TITER (ANA TITER)
ANA TITER: 1:640 {titer} — ABNORMAL HIGH
ANA Titer 1: 1:80 {titer} — ABNORMAL HIGH

## 2018-05-08 NOTE — Telephone Encounter (Signed)
Called patient and left detailed message with lab results and asking pt if Rheumatology is something she is interested in seeing. If pt is agreeable, referral needs to be placed.   Okay for PEC to discuss results/PCP recommendations.

## 2018-05-08 NOTE — Telephone Encounter (Signed)
Please inform patient the following information: I am still waiting on her lyme titers. However her autoimmune panel did come back as positive. Next step is to be evaluated by a  Rheumatologist, which is the type of specialist for her condition and positive autoimmune panel.

## 2018-05-09 NOTE — Telephone Encounter (Signed)
Called and left message on patients VM to return call in regards to her lab results and referral.

## 2018-05-10 NOTE — Telephone Encounter (Signed)
Pt was called and given results, per Dr Raoul Pitch Lyme titer was neg and pt was relayed that information, pt verbalized understanding and would like the referral placed.

## 2018-05-31 ENCOUNTER — Telehealth: Payer: Self-pay | Admitting: Family Medicine

## 2018-05-31 NOTE — Telephone Encounter (Signed)
Pt was called and given information and phone number to Dr Estanislado Pandy to call and schedule. Pt states they called her but they could not schedule yet due the virus. Pt stated that was a while ago and she does not have their number. Pt was given number to call them to get appt set up.

## 2018-05-31 NOTE — Telephone Encounter (Signed)
Copied from [place] [id number]. Topic: Quick Communication - See Telephone Encounter >> May 31, 2018  2:12 PM Rutherford Nail, [place] wrote: CRM for notification. See Telephone encounter for: 05/31/18. Patient calling and is requesting a call from [place]. States that the muscle and bone soreness have become worse and she would like her advice.  CB#: [phone]

## 2018-06-06 ENCOUNTER — Encounter: Payer: Self-pay | Admitting: Family Medicine

## 2018-06-06 ENCOUNTER — Telehealth: Payer: Self-pay | Admitting: Family Medicine

## 2018-06-06 ENCOUNTER — Ambulatory Visit (INDEPENDENT_AMBULATORY_CARE_PROVIDER_SITE_OTHER): Payer: Medicare HMO | Admitting: Family Medicine

## 2018-06-06 ENCOUNTER — Other Ambulatory Visit: Payer: Self-pay

## 2018-06-06 VITALS — Temp 96.9°F | Ht 61.0 in

## 2018-06-06 DIAGNOSIS — E559 Vitamin D deficiency, unspecified: Secondary | ICD-10-CM | POA: Diagnosis not present

## 2018-06-06 DIAGNOSIS — R7689 Other specified abnormal immunological findings in serum: Secondary | ICD-10-CM

## 2018-06-06 DIAGNOSIS — M791 Myalgia, unspecified site: Secondary | ICD-10-CM

## 2018-06-06 DIAGNOSIS — R768 Other specified abnormal immunological findings in serum: Secondary | ICD-10-CM | POA: Diagnosis not present

## 2018-06-06 DIAGNOSIS — M2550 Pain in unspecified joint: Secondary | ICD-10-CM | POA: Diagnosis not present

## 2018-06-06 MED ORDER — TRAMADOL HCL 50 MG PO TABS: 50.0000 mg | ORAL_TABLET | Freq: Two times a day (BID) | ORAL | 1 refills | Status: DC | PRN

## 2018-06-06 NOTE — Telephone Encounter (Signed)
Please call Margaret [name] and advise her I went ahead and ordered additional labs to further test for an autoimmune condition since rheumatology had to move her appt back secondary to [place].  At the very least, even if negative- we would have ruled out or in a diagnosis and hopefully rheumatology will be able to narrow it down quicker for her when she does see them.   Please schedule her for lab. Thanks.

## 2018-06-06 NOTE — Progress Notes (Signed)
Virtual Visit via Video   I connected with Margaret [name] on 06/06/18 at  1:40 PM EDT by a video enabled telemedicine application and verified that I am speaking with the correct person using two identifiers. Location patient: Home Location provider: [facility], Office Persons participating in the virtual visit: Patient, Dr. Raoul Pitch and R.Baker, LPN  I discussed the limitations of evaluation and management by telemedicine and the availability of in person appointments. The patient expressed understanding and agreed to proceed.  Subjective:   Chief Complaint  Patient presents with  . Joint Pain    Pt continues to have joint pain. Pt called rheumotology and they cannot see her until June at the earliest. Pt is unable to take vital signs at home.     HPI:  Muscle weakness of proximal extremity/myalgias:  Patient presents today to discuss her arthralgias and myalgias.  Symptoms are worsening.  She reports increased symptoms in her left shoulder her left hand and her left hip.  Her symptoms are bilateral, worse on the left side. Her CMP, CRP, ESR, TSH, CBC and Lyme titers were all normal. Her ANA was positive, 1:640 nuclear homogenous. CCP and RF normal.  She had been referred to rheumatology, however secondary to COVID-19 outbreak her appointment has been pushed back till May and patient is in increased pain.  Patient also has occluded angle glaucoma with macular degeneration. She is taking the Mobic 7.5 mg daily.  Prior note: Pt reports she is still experiencing arthralgias and myalgias of her left hand, upper arms and upper legs. Her CMP. CRP, ESR, TSH and CBC was normal. She was started on low dose mobic which states has been mildly helpful, but her pain still persist. The pain is worse in the morning and in the evening (after her nap). She has arthritis in her family (unknwn type). She also states she has tick exposure 2 years ago, was provided with an abx, but does not recall  the type or length of the course. She was not tested later for lyme that she was aware of.  Prior note:  Patient reports over the last 2-3 months since December she has noticed development of bilateral proximal arm discomfort and bilateral hip thigh discomfort.  She reports the discomfort is worse after sitting still resting for some time and first thing in the morning.  She states it feels better after she gets up and starts moving approximately 1 to 2 hours after becoming active.  She has used Advil for the discomfort and that has been helpful.  She reports she moved to a new home in September and the cabinets are very high and she has had 2 region stretch more to work in her kitchen.  Otherwise she states she has not changed any activity.  She denies fever, chills or unintentional weight loss.  She reports discomfort is in the muscles, and not in her joints.  She does admit to some increased fatigue, but she thought that was secondary to her move.  She was started on levothyroxine 50 mcg around May, she does not recall any additional testing on her thyroid after starting medication.  He has some osteoarthritis, but reports she gets around pretty good.  She enjoys gardening and being out in the yard.  She walks daily.  ROS: See pertinent positives and negatives per HPI.  Patient Active Problem List   Diagnosis Date Noted  . ANA positive 05/08/2018  . Polyarthralgia 05/03/2018  . Tick bite 05/03/2018  .  Vitamin D deficiency   . PVD (peripheral vascular disease) ([place])   . Iron deficiency anemia   . Seasonal allergies 03/29/2018  . Nocturnal leg cramps 03/29/2018  . Myalgia 03/29/2018  . Hypothyroidism 03/29/2018  . Overweight (BMI 25.0-29.9) 03/29/2018  . Asthma   . Cervical radiculopathy 05/16/2015  . DDD (degenerative disc disease), lumbar 04/26/2015  . Carotid artery stenosis 10/17/2012  . Mitral valve prolapse 08/27/2011    Social History   Tobacco Use  . Smoking status: Former Smoker     Packs/day: 0.50    Types: Cigarettes    Start date: 1952    Last attempt to quit: 1965    Years since quitting: 55.3  . Smokeless tobacco: Never Used  Substance Use Topics  . Alcohol use: Yes    Alcohol/week: 1.0 standard drinks    Types: 1 Glasses of wine per week    Comment: 2-3 times weekly     Current Outpatient Medications:  .  albuterol (PROVENTIL HFA;VENTOLIN HFA) 108 (90 Base) MCG/ACT inhaler, Inhale 2 puffs into the lungs every 6 (six) hours as needed for wheezing or shortness of breath., Disp: 1 Inhaler, Rfl: 2 .  fluticasone (FLONASE) 50 MCG/ACT nasal spray, , Disp: , Rfl:  .  levothyroxine (SYNTHROID, LEVOTHROID) 50 MCG tablet, Take 1 tablet (50 mcg total) by mouth daily before breakfast., Disp: 90 tablet, Rfl: 3 .  meloxicam (MOBIC) 7.5 MG tablet, Take 1 tablet (7.5 mg total) by mouth daily., Disp: 90 tablet, Rfl: 3 .  montelukast (SINGULAIR) 10 MG tablet, Take 1 tablet (10 mg total) by mouth at bedtime., Disp: 90 tablet, Rfl: 3 .  Multiple Vitamins-Minerals (PRESERVISION AREDS 2) CAPS, Take by mouth., Disp: , Rfl:  .  pramipexole (MIRAPEX) 0.25 MG tablet, TK 1 T PO 1 HOUR BEFORE BEDTIME, Disp: 90 tablet, Rfl: 1 .  prednisoLONE acetate (PRED FORTE) 1 % ophthalmic suspension, , Disp: , Rfl:   Allergies  Allergen Reactions  . Augmentin [Amoxicillin-Pot Clavulanate] Swelling and Rash    Swelling in face    Objective:  Temp (!) 96.9 F (36.1 C) (Oral)   Ht '5\' 1"'  (1.549 m)   BMI 24.99 kg/m  Gen: No acute distress. Nontoxic in appearance.  HENT: AT. [place].  MMM.  Eyes: Conjunctiva without redness, discharge or icterus. Chest: Cough or shortness of breath not present Skin: no rashes, purpura or petechiae.  Neuro:  Normal gait. Alert. Oriented x3  Psych: Normal affect, dress and demeanor. Normal speech. Normal thought content and judgment.   Assessment and Plan:  Myalgia/Muscle weakness of proximal extremity/ANA positive - ANA +1: 640-nuclear homogenous -  CCP and  RF negative - keep appt for rheumatology for further evaluation .  - continue mobic 7.5 mg at night. - try water aerobics- keep active and exercise. - she will check with her ophthalmologist to see if prednisone use short term would be ok for her with her glaucoma history. If she is able will call in 10-21 day lower dose taper  - provided script for tramadol 50 mg BID PRN for her chronic pain. Failed tylenol and nsaids.  Pt was educated on control substance and sedation precautions.  - future labs: anti-DS DNA, Anti-SM, antiphospholipid, b12, vit d, ck F/u if needed only, sooner if worsening prior to rheumatology appt.   > 25 minutes spent with patient, >50% of time spent face to face counseling     Howard Pouch, DO 06/06/2018

## 2018-06-07 ENCOUNTER — Telehealth: Payer: Self-pay

## 2018-06-07 MED ORDER — PREDNISONE 10 MG PO TABS: ORAL_TABLET | ORAL | 0 refills | Status: DC

## 2018-06-07 NOTE — Telephone Encounter (Signed)
Copied from [place] [id number]. Topic: General - Other >> Jun 07, 2018 10:23 AM Yvette Rack wrote: Reason for CRM: Pt called to inform Dr. Raoul Pitch that she spoke with [facility] and she was told that she could have the Rx for prednisoLONE acetate (PRED FORTE) 1 % ophthalmic suspension filled. Attempted to transfer the pt to the office but there was no answer. Pt requests call back.  Pt was contacted and stated she spoke with eye doctor and it was okay to use steroid. I asked if they were sending over anything in writing and she said they were not. Please let me know if this is enough or if we need something from MD in writing.   Thanks

## 2018-06-07 NOTE — Telephone Encounter (Signed)
Pt was called and given directions. She verbalized understanding and will call with any questions

## 2018-06-07 NOTE — Addendum Note (Signed)
Addended by: Howard Pouch A on: 06/07/2018 03:14 PM   Modules accepted: Orders

## 2018-06-07 NOTE — Telephone Encounter (Signed)
I have prescribed a steroid PILL taper for almost 2 weeks. Take as bottle directs- finish entire taper.  Use the other meds for pain as we discussed prior. We will call her with lab results once available.

## 2018-06-07 NOTE — Telephone Encounter (Signed)
Pt was called and detailed message was left to call back and schedule lab appt. Okay per [facility]

## 2018-06-09 ENCOUNTER — Other Ambulatory Visit (INDEPENDENT_AMBULATORY_CARE_PROVIDER_SITE_OTHER): Payer: Medicare HMO

## 2018-06-09 DIAGNOSIS — M2550 Pain in unspecified joint: Secondary | ICD-10-CM

## 2018-06-09 DIAGNOSIS — R768 Other specified abnormal immunological findings in serum: Secondary | ICD-10-CM

## 2018-06-09 DIAGNOSIS — E559 Vitamin D deficiency, unspecified: Secondary | ICD-10-CM

## 2018-06-09 DIAGNOSIS — M791 Myalgia, unspecified site: Secondary | ICD-10-CM | POA: Diagnosis not present

## 2018-06-09 LAB — VITAMIN D 25 HYDROXY (VIT D DEFICIENCY, FRACTURES): VITD: 26.88 ng/mL — ABNORMAL LOW (ref 30.00–100.00)

## 2018-06-09 LAB — VITAMIN B12: Vitamin B-12: 569 pg/mL (ref 211–911)

## 2018-06-09 LAB — CK: Total CK: 79 U/L (ref 7–177)

## 2018-06-12 LAB — CYCLIC CITRUL PEPTIDE ANTIBODY, IGG: Cyclic Citrullin Peptide Ab: <16 UNITS

## 2018-06-12 LAB — ANTI-DNA ANTIBODY, DOUBLE-STRANDED: ds DNA Ab: <1 IU/mL

## 2018-06-12 LAB — RHEUMATOID FACTOR: Rheumatoid fact SerPl-aCnc: <14 IU/mL (ref <14)

## 2018-06-12 LAB — ANTI-SMOOTH MUSCLE ANTIBODY, IGG: Actin (Smooth Muscle) Antibody (IGG): <20 U (ref <20)

## 2018-06-13 ENCOUNTER — Encounter: Payer: Self-pay | Admitting: Family Medicine

## 2018-06-13 ENCOUNTER — Telehealth: Payer: Self-pay | Admitting: Family Medicine

## 2018-06-13 LAB — ANTIPHOSPHOLOPID AB PANEL
Anticardiolipin IgA: <11 [APL'U] (ref <=11)
Anticardiolipin IgG: <14 [GPL'U] (ref <=14)
Anticardiolipin IgM: <12 [MPL'U] (ref <=12)
Beta-2 Glyco 1 IgA: <9 SAU (ref <=20)
Beta-2 Glyco 1 IgM: <9 SMU (ref <=20)
Beta-2 Glyco I IgG: <9 SGU (ref <=20)
PHOSPHATIDYLSERINE AB  (IGG): <10 U/mL (ref <10)
PHOSPHATIDYLSERINE AB  (IGM): <25 U/mL (ref <25)
PHOSPHATIDYLSERINE AB (IGA): <20 U/mL (ref <20)

## 2018-06-13 MED ORDER — VITAMIN D (ERGOCALCIFEROL) 1.25 MG (50000 UNIT) PO CAPS: 50000.0000 [IU] | ORAL_CAPSULE | ORAL | 0 refills | Status: DC

## 2018-06-13 NOTE — Telephone Encounter (Signed)
Tried to contact patient and there was no answer. Will try again.

## 2018-06-13 NOTE — Telephone Encounter (Signed)
Please inform patient the following information: - all her labs are normal, with an exception of her vit d was mildly low at 26. - I have called in a ONCE A WEEK supplement of vit d to help boost her levels to normal.  - Low vit d can also cause muscle fatigue- but usual at levels much lower than she has.  - After she has competed the once a week supplement she should also make sure to get 1000u vit d  daily. Vit d is also better absorbed if taken with food.

## 2018-06-13 NOTE — Telephone Encounter (Signed)
Pt was called and pt wanted me to speak with daughter to give results. Results were given and daughter verbalized understanding.    Daughter stated pt was doing much better on prednisone and was nervous she would go back to being in pain once completed. She is frustrated that the rheumatologist told them her mom was at the bottom of the list to be seen and the earliest would be June.  Daughter was told to call around and see if other rheumatologist offices could see her sooner but most have a longer than normal wait times for NP. Daughter verbalized understanding.  Daughter was told to call Dr Raoul Pitch if pain restarted or got bad again.

## 2018-06-14 ENCOUNTER — Telehealth: Payer: Self-pay | Admitting: Family Medicine

## 2018-06-14 ENCOUNTER — Encounter: Payer: Self-pay | Admitting: Family Medicine

## 2018-06-14 NOTE — Telephone Encounter (Signed)
My-chart message sent back to patient.  

## 2018-06-14 NOTE — Telephone Encounter (Signed)
Please try to make office to office contact with the rheumatologist she is waiting to be seen by, and inform them the patient is in increasing pain and if possible, could we get her moved up on their list for evaluation.   Please inform me of their decision.

## 2018-06-14 NOTE — Telephone Encounter (Signed)
Pt has appt tomorrow at 1pm with Dr Dossie Der. Please fax paperwork to that office that they need, including labs we have done. Pt was called and aware of appt  Thx

## 2018-06-14 NOTE — Telephone Encounter (Signed)
Pt would not be able to get in until at least June with Dr Estanislado Pandy per receptionist who returned call   Per Diane: Dr Dossie Der is taking new patients. The referral coordinator said if we fax her the referral she can get her in within a week. I checked the patient's insurance & he is in network --> please call Mrs. Dunnam and see if she is willing to go there--> if so please document in phone note and tell Diane so she she can get on this ASAP.   Pt was called and message was left to return call. Daughter was called as well with no answer.

## 2018-06-14 NOTE — Telephone Encounter (Signed)
Rheumatologist was called and message was left for them to return call. Pts information was left on VM and the need for her to have a NP appt sooner than June if possible, callback number was left. My chart message was sent to patient letting her know I have called

## 2018-06-15 DIAGNOSIS — M79642 Pain in left hand: Secondary | ICD-10-CM | POA: Diagnosis not present

## 2018-06-15 DIAGNOSIS — M064 Inflammatory polyarthropathy: Secondary | ICD-10-CM | POA: Diagnosis not present

## 2018-06-15 DIAGNOSIS — M25511 Pain in right shoulder: Secondary | ICD-10-CM | POA: Diagnosis not present

## 2018-06-15 DIAGNOSIS — M19011 Primary osteoarthritis, right shoulder: Secondary | ICD-10-CM | POA: Diagnosis not present

## 2018-06-15 DIAGNOSIS — M359 Systemic involvement of connective tissue, unspecified: Secondary | ICD-10-CM | POA: Diagnosis not present

## 2018-06-15 DIAGNOSIS — M25561 Pain in right knee: Secondary | ICD-10-CM | POA: Diagnosis not present

## 2018-06-15 DIAGNOSIS — M25512 Pain in left shoulder: Secondary | ICD-10-CM | POA: Diagnosis not present

## 2018-06-15 DIAGNOSIS — M19041 Primary osteoarthritis, right hand: Secondary | ICD-10-CM | POA: Diagnosis not present

## 2018-06-15 DIAGNOSIS — M25462 Effusion, left knee: Secondary | ICD-10-CM | POA: Diagnosis not present

## 2018-06-15 DIAGNOSIS — M19012 Primary osteoarthritis, left shoulder: Secondary | ICD-10-CM | POA: Diagnosis not present

## 2018-06-15 DIAGNOSIS — M25461 Effusion, right knee: Secondary | ICD-10-CM | POA: Diagnosis not present

## 2018-06-15 DIAGNOSIS — M810 Age-related osteoporosis without current pathological fracture: Secondary | ICD-10-CM | POA: Diagnosis not present

## 2018-06-15 DIAGNOSIS — M79641 Pain in right hand: Secondary | ICD-10-CM | POA: Diagnosis not present

## 2018-06-15 DIAGNOSIS — R768 Other specified abnormal immunological findings in serum: Secondary | ICD-10-CM | POA: Diagnosis not present

## 2018-06-15 DIAGNOSIS — M19042 Primary osteoarthritis, left hand: Secondary | ICD-10-CM | POA: Diagnosis not present

## 2018-06-15 DIAGNOSIS — M1990 Unspecified osteoarthritis, unspecified site: Secondary | ICD-10-CM | POA: Diagnosis not present

## 2018-06-16 ENCOUNTER — Encounter: Payer: Self-pay | Admitting: Family Medicine

## 2018-06-16 ENCOUNTER — Telehealth (INDEPENDENT_AMBULATORY_CARE_PROVIDER_SITE_OTHER): Payer: Self-pay

## 2018-06-28 DIAGNOSIS — M1990 Unspecified osteoarthritis, unspecified site: Secondary | ICD-10-CM | POA: Diagnosis not present

## 2018-06-28 DIAGNOSIS — E039 Hypothyroidism, unspecified: Secondary | ICD-10-CM | POA: Diagnosis not present

## 2018-06-28 DIAGNOSIS — M064 Inflammatory polyarthropathy: Secondary | ICD-10-CM | POA: Diagnosis not present

## 2018-06-28 DIAGNOSIS — R768 Other specified abnormal immunological findings in serum: Secondary | ICD-10-CM | POA: Diagnosis not present

## 2018-06-28 DIAGNOSIS — M810 Age-related osteoporosis without current pathological fracture: Secondary | ICD-10-CM | POA: Diagnosis not present

## 2018-07-11 ENCOUNTER — Other Ambulatory Visit: Payer: Self-pay

## 2018-07-11 ENCOUNTER — Ambulatory Visit (INDEPENDENT_AMBULATORY_CARE_PROVIDER_SITE_OTHER): Payer: Medicare HMO | Admitting: Ophthalmology

## 2018-07-11 DIAGNOSIS — H401131 Primary open-angle glaucoma, bilateral, mild stage: Secondary | ICD-10-CM | POA: Diagnosis not present

## 2018-07-11 DIAGNOSIS — Z79899 Other long term (current) drug therapy: Secondary | ICD-10-CM

## 2018-07-11 DIAGNOSIS — H3581 Retinal edema: Secondary | ICD-10-CM | POA: Diagnosis not present

## 2018-07-11 DIAGNOSIS — H04123 Dry eye syndrome of bilateral lacrimal glands: Secondary | ICD-10-CM | POA: Diagnosis not present

## 2018-07-11 DIAGNOSIS — Z961 Presence of intraocular lens: Secondary | ICD-10-CM | POA: Diagnosis not present

## 2018-07-11 DIAGNOSIS — H353132 Nonexudative age-related macular degeneration, bilateral, intermediate dry stage: Secondary | ICD-10-CM

## 2018-07-11 NOTE — Progress Notes (Addendum)
[facility] Note  07/11/2018     CHIEF COMPLAINT Patient presents for Retina Evaluation   HISTORY OF PRESENT ILLNESS: Margaret [name] is a 84 y.o. female who presents to the clinic today for:   HPI    Retina Evaluation    In both eyes.  Duration of 4 months.  Associated Symptoms Negative for Flashes, Floaters, Distortion, Blind Spot, Pain, Redness, Photophobia, Glare, Trauma, Scalp Tenderness, Jaw Claudication, Shoulder/Hip pain, Fever, Weight Loss and Fatigue.  Context:  distance vision, mid-range vision and near vision.  Treatments tried include no treatments.  I, the attending physician,  performed the HPI with the patient and updated documentation appropriately.          Comments    Pt here for baseline VF 10-2 due to being put on plaquenil 3-4 weeks ago by her rheumatologist, pt is taking 2 200mg  pills a day       Last edited by Bernarda Caffey, MD on 07/12/2018  8:22 AM. (History)     Patient states her rheumatologist put her on plaquenil 3-4 weeks ago due to being in a lot of pain from RA, pt states she is taking 400 mg a day (2, 200 mg pills a day), pt states she was on prednisone prior to plaquenil and it seemed to help her pain a lot   Referring physician: Valinda Party, MD [place], [place] [address]  HISTORICAL INFORMATION:   Selected notes from the MEDICAL RECORD NUMBER Self referral for macular degeneration LEE: 03.21.19 (Dr. Park Liter in [place], [place]) [BCVA: OD: 20/40 OS: 20/40] Ocular Hx-POAG, non-exu ARMD, DES, pseudo, YAG PMH-astham, arthritis,     CURRENT MEDICATIONS: Current Outpatient Medications (Ophthalmic Drugs)  Medication Sig  . prednisoLONE acetate (PRED FORTE) 1 % ophthalmic suspension    No current facility-administered medications for this visit.  (Ophthalmic Drugs)   Current Outpatient Medications (Other)  Medication Sig  . albuterol (PROVENTIL HFA;VENTOLIN HFA) 108 (90 Base) MCG/ACT  inhaler Inhale 2 puffs into the lungs every 6 (six) hours as needed for wheezing or shortness of breath.  . fluticasone (FLONASE) 50 MCG/ACT nasal spray   . levothyroxine (SYNTHROID, LEVOTHROID) 50 MCG tablet Take 1 tablet (50 mcg total) by mouth daily before breakfast.  . meloxicam (MOBIC) 7.5 MG tablet Take 1 tablet (7.5 mg total) by mouth daily.  . montelukast (SINGULAIR) 10 MG tablet Take 1 tablet (10 mg total) by mouth at bedtime.  . Multiple Vitamins-Minerals (PRESERVISION AREDS 2) CAPS Take by mouth.  . pramipexole (MIRAPEX) 0.25 MG tablet TK 1 T PO 1 HOUR BEFORE BEDTIME  . predniSONE (DELTASONE) 10 MG tablet 20 mg po qdx 3 days, then 10 mg QD x6 days, then 5 mg QD for 4 days  . traMADol (ULTRAM) 50 MG tablet Take 1 tablet (50 mg total) by mouth every 12 (twelve) hours as needed for moderate pain.  . Vitamin D, Ergocalciferol, (DRISDOL) 1.25 MG (50000 UT) CAPS capsule Take 1 capsule (50,000 Units total) by mouth every 7 (seven) days.   No current facility-administered medications for this visit.  (Other)      REVIEW OF SYSTEMS: ROS    Positive for: Musculoskeletal, Endocrine, Eyes   Negative for: Constitutional, Gastrointestinal, Neurological, Skin, Genitourinary, HENT, Cardiovascular, Respiratory, Psychiatric, Allergic/Imm, Heme/Lymph   Last edited by Bernarda Caffey, MD on 07/12/2018  1:42 PM. (History)       ALLERGIES Allergies  Allergen Reactions  . Augmentin [Amoxicillin-Pot Clavulanate] Swelling and  Rash    Swelling in face    PAST MEDICAL HISTORY Past Medical History:  Diagnosis Date  . Allergy   . Arthritis   . Arthritis of left knee   . Asthma   . Carotid artery stenosis 10/17/2012   stable report in 2018- "moderate on right, minimal on left"  . Carpal tunnel syndrome 2018   left.  . Cervical radiculopathy 05/16/2015   DDD C4-C5, C5-C6, and C6-C7.  Facet arthropathy throughout cervical spine.  Normal foraminal narrowing secondary to uncovertebral  arthropathy  is seen bilaterally at C3-C4 and C5/C6  . Chicken pox   . DDD (degenerative disc disease), lumbar 04/26/2015   Mild levoscoliosis, right anterior listhesis of L4 and L5.  DDD at every level.  Facet arthropathy at multiple levels.  . Deviated septum   . Glaucoma    both eyes, mild opened angle  . Heart murmur   . History of colon polyps   . History of fall   . History of frequent urinary tract infections    klebs- pansensitive. c. freundii - resitant to cefazolin and augementin  . Hypothyroidism   . Iron deficiency anemia    prior pcp told her to take Fe 325 QD  . Macular degeneration    h/o retinal edema  . Mitral valve prolapse 08/27/2011  . Murmur, cardiac 10/17/2012  . Osteoporosis   . 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 Pseudophakia of both eyes   . PVD (peripheral vascular disease) ([place])   . Rheumatic fever    85 years old   . Syncope 05/17/2017  . Thyroid disease   . Urinary incontinence   . Vertigo    had been prescribed antivert  . Vitamin D deficiency    Past Surgical History:  Procedure Laterality Date  . CARPAL TUNNEL RELEASE Bilateral 2019  . CATARACT EXTRACTION  1997  . colonoscopy   2010  . NASAL SEPTUM SURGERY  2007  . WRIST FRACTURE SURGERY Right 1996    FAMILY HISTORY Family History  Problem Relation Age of Onset  . Arthritis Mother   . COPD Mother   . Prostate cancer Father   . Arthritis Father   . Hearing loss Father   . Heart disease Father   . Heart attack Father   . Blindness Maternal Grandmother   . Stroke Maternal Grandmother   . Liver cancer Sister   . Arthritis Brother   . Prostate cancer Brother   . COPD Brother   . Heart disease Brother   . Breast cancer Daughter   . COPD Paternal Grandmother   . Breast cancer Paternal Grandmother     SOCIAL HISTORY Social History   Tobacco Use  . Smoking status: Former Smoker    Packs/day: 0.50    Types: Cigarettes    Start date: 1952    Last attempt to quit: 1965    Years since  quitting: 55.4  . Smokeless tobacco: Never Used  Substance Use Topics  . Alcohol use: Yes    Alcohol/week: 1.0 standard drinks    Types: 1 Glasses of wine per week    Comment: 2-3 times weekly   . Drug use: Never         OPHTHALMIC EXAM:  Base Eye Exam    Visual Acuity (Snellen - Linear)      Right Left   Dist cc 20/30 +2 20/20 -2       Tonometry (Tonopen, 1:30 PM)  Right Left   Pressure 18 16       Pupils      Dark Light Shape React APD   Right 4 3 Round Brisk None   Left 4 3 Round Brisk None       Extraocular Movement      Right Left    Full, Ortho Full, Ortho       Neuro/Psych    Oriented x3:  Yes   Mood/Affect:  Normal       Dilation    Both eyes:  1.0% Mydriacyl, 2.5% Phenylephrine @ 1:30 PM        Slit Lamp and Fundus Exam    Slit Lamp Exam      Right Left   Lids/Lashes Dermatochalasis - upper lid, Telangiectasia Dermatochalasis - upper lid, Telangiectasia   Conjunctiva/Sclera Temporal Pinguecula White and quiet   Cornea Arcus, 1-2+ Punctate epithelial erosions, Debris in tear film Arcus, 1-2+ Punctate epithelial erosions   Anterior Chamber Deep and quiet, narrow temporal angle Deep and quiet   Iris Round and dilated Round and dilated   Lens PC IOL in good position with open PC PC IOL in good position with open PC   Vitreous Vitreous syneresis Vitreous syneresis       Fundus Exam      Right Left   Disc 360 Peripapillary atrophy Peripapillary atrophy, difficult to asess rim   C/D Ratio 0.6 0.2   Macula Flat, Blunted foveal reflex, drusen, RPE mottling and clumping, no heme or edema Flat, Blunted foveal reflex, drusen, RPE mottling and clumping, no heme or edema   Vessels Vascular attenuation, Tortuous Vascular attenuation   Periphery Attached, scattered Reticular degeneration, pigmented cystoid degeneration temporally Attached, scattered Reticular degeneration        Refraction    Wearing Rx      Sphere Cylinder Axis Add   Right -0.75  +2.25 175 +2.75   Left -0.25 +1.25 165 +2.75       Manifest Refraction      Sphere Cylinder Axis Dist VA   Right -1.00 +2.75 175 20/25-2   Left              IMAGING AND PROCEDURES  Imaging and Procedures for @TODAY @  OCT, Retina - OU - Both Eyes       Right Eye Quality was good. Central Foveal Thickness: 310. Progression has been stable. Findings include normal foveal contour, no IRF, no SRF, retinal drusen , outer retinal atrophy, epiretinal membrane (Partial PVD).   Left Eye Quality was good. Central Foveal Thickness: 301. Progression has been stable. Findings include normal foveal contour, no IRF, no SRF, retinal drusen , outer retinal atrophy, epiretinal membrane (Trace ERM).   Notes *Images captured and stored on drive  Diagnosis / Impression:  Non-Exudative ARMD OU--no change from prior  Clinical management:  See below  Abbreviations: NFP - Normal foveal profile. CME - cystoid macular edema. PED - pigment epithelial detachment. IRF - intraretinal fluid. SRF - subretinal fluid. EZ - ellipsoid zone. ERM - epiretinal membrane. ORA - outer retinal atrophy. ORT - outer retinal tubulation. SRHM - subretinal hyper-reflective material         Humphrey Visual Field - OU - Both Eyes       Right Eye Reliability was good. Progression has no prior data. Findings include non-specific defects.   Left Eye Reliability was good. Progression has no prior data. Findings include non-specific defects.   Notes HVF 10-2 OU SITA-STD  OD Fixation  losses: 1/17 False positive errors: 0% False negative errors: 3% Findings: mild nonspecific defects  OS Fixation losses: 0/20 False positive errors: 1% False negative errors: 3% Findings: mild nonspecific defects                 ASSESSMENT/PLAN:    ICD-10-CM   1. Intermediate stage nonexudative age-related macular degeneration of both eyes H35.3132   2. Long-term use of Plaquenil Z79.899 Humphrey Visual Field - OU  - Both Eyes  3. Retinal edema H35.81 OCT, Retina - OU - Both Eyes  4. Pseudophakia of both eyes Z96.1   5. Primary open angle glaucoma (POAG) of both eyes, mild stage H40.1131   6. Dry eyes H04.123     1. Age related macular degeneration, non-exudative, both eyes  - mild-intermediate stage-no change from previous visit  - The incidence, anatomy, and pathology of dry AMD, risk of progression, and the AREDS and AREDS 2 study including smoking risks discussed with patient.  - Recommend amsler grid monitoring  - f/u 6-8 months-call sooner if changes in amsler grid  2. Plaquenil use for RA  - baseline testing today for starting plaquenil ~3-4 wks ago (early May 2020) for RA  - HVF 10-2 shows non-specific defects OU -- essentially normal baseline test  - OCT shows baseline retinal abnormalities secondary to ARMD  - taking 400mg  / day --> 6.67 mg/kg body wt per day  - the American Academy of Ophthalmology recommends dosing 5mg /kg per day to reduce risk of retinal toxicity  - recommend consideration of using alternate medication for RA especially given co-morbidity of age-related macular degeneration OU  - if unable to use alternate medication, would recommend decreasing dose to 300 mg or less per day.  - will send notes to Dr. Dossie Der  3. No retinal edema on exam or OCT  4. Pseudophakia OU  - s/p CE/IOL w/ ?canaloplasty OU by Dr[facility] Robyne As[facility]w at [facility]  - doing well  - monitor  5. History of Glaucoma-   - not currently on gtts -- IOP 14 OU  - ?underwent canaloplasty in conjunction with CEIOL at [facility]  - referred to and now under the expert care of Dr. Kathlen Mody -- initial visit/consult 10.3.2019  6. Dry Eye Syndrome  -use AT's tid OU  Ophthalmic Meds Ordered this visit:  No orders of the defined types were placed in this encounter.      Return for 6-8 mos, Dilated Exam, OCT -- f/u nonexu ARMD OU.  There are no Patient Instructions on file for this  visit.   Explained the diagnoses, plan, and follow up with the patient and they expressed understanding.  Patient expressed understanding of the importance of proper follow up care.   This document serves as a record of services personally performed by Gardiner Sleeper, MD, PhD. It was created on their behalf by Ernest Mallick, OA, an ophthalmic assistant. The creation of this record is the provider's dictation and/or activities during the visit.    Electronically signed by: Ernest Mallick, OA  05.19.2020 1:56 PM       Gardiner Sleeper, M.D., Ph.D. Diseases & Surgery of the Retina and Vitreous [facility]  I have reviewed the above documentation for accuracy and completeness, and I agree with the above. Gardiner Sleeper, M.D., Ph.D. 07/12/18 1:56 PM       Abbreviations: M myopia (nearsighted); A astigmatism; H hyperopia (farsighted); P presbyopia; Mrx spectacle prescription;  CTL contact lenses; OD  right eye; OS left eye; OU both eyes  XT exotropia; ET esotropia; PEK punctate epithelial keratitis; PEE punctate epithelial erosions; DES dry eye syndrome; MGD meibomian gland dysfunction; ATs artificial tears; PFAT's preservative free artificial tears; [place] nuclear sclerotic cataract; PSC posterior subcapsular cataract; ERM epi-retinal membrane; PVD posterior vitreous detachment; RD retinal detachment; DM diabetes mellitus; DR diabetic retinopathy; NPDR non-proliferative diabetic retinopathy; PDR proliferative diabetic retinopathy; CSME clinically significant macular edema; DME diabetic macular edema; dbh dot blot hemorrhages; CWS cotton wool spot; POAG primary open angle glaucoma; C/D cup-to-disc ratio; HVF humphrey visual field; GVF goldmann visual field; OCT optical coherence tomography; IOP intraocular pressure; BRVO Branch retinal vein occlusion; CRVO central retinal vein occlusion; CRAO central retinal artery occlusion; BRAO branch retinal artery occlusion; RT retinal tear;  SB scleral buckle; PPV pars plana vitrectomy; VH Vitreous hemorrhage; PRP panretinal laser photocoagulation; IVK intravitreal kenalog; VMT vitreomacular traction; MH Macular hole;  NVD neovascularization of the disc; NVE neovascularization elsewhere; AREDS age related eye disease study; ARMD age related macular degeneration; POAG primary open angle glaucoma; EBMD epithelial/anterior basement membrane dystrophy; ACIOL anterior chamber intraocular lens; IOL intraocular lens; PCIOL posterior chamber intraocular lens; Phaco/IOL phacoemulsification with intraocular lens placement; [place] photorefractive keratectomy; LASIK laser assisted in situ keratomileusis; HTN hypertension; DM diabetes mellitus; COPD chronic obstructive pulmonary disease

## 2018-07-12 ENCOUNTER — Encounter (INDEPENDENT_AMBULATORY_CARE_PROVIDER_SITE_OTHER): Payer: Self-pay | Admitting: Ophthalmology

## 2018-07-20 DIAGNOSIS — H401111 Primary open-angle glaucoma, right eye, mild stage: Secondary | ICD-10-CM | POA: Diagnosis not present

## 2018-07-20 DIAGNOSIS — H40022 Open angle with borderline findings, high risk, left eye: Secondary | ICD-10-CM | POA: Diagnosis not present

## 2018-07-20 DIAGNOSIS — Z79899 Other long term (current) drug therapy: Secondary | ICD-10-CM | POA: Diagnosis not present

## 2018-07-20 DIAGNOSIS — H353132 Nonexudative age-related macular degeneration, bilateral, intermediate dry stage: Secondary | ICD-10-CM | POA: Diagnosis not present

## 2018-07-20 DIAGNOSIS — Z961 Presence of intraocular lens: Secondary | ICD-10-CM | POA: Diagnosis not present

## 2018-07-20 DIAGNOSIS — M069 Rheumatoid arthritis, unspecified: Secondary | ICD-10-CM | POA: Diagnosis not present

## 2018-07-27 DIAGNOSIS — R768 Other specified abnormal immunological findings in serum: Secondary | ICD-10-CM | POA: Diagnosis not present

## 2018-07-27 DIAGNOSIS — E039 Hypothyroidism, unspecified: Secondary | ICD-10-CM | POA: Diagnosis not present

## 2018-07-27 DIAGNOSIS — M064 Inflammatory polyarthropathy: Secondary | ICD-10-CM | POA: Diagnosis not present

## 2018-07-27 DIAGNOSIS — M810 Age-related osteoporosis without current pathological fracture: Secondary | ICD-10-CM | POA: Diagnosis not present

## 2018-07-27 DIAGNOSIS — M1990 Unspecified osteoarthritis, unspecified site: Secondary | ICD-10-CM | POA: Diagnosis not present

## 2018-08-09 DIAGNOSIS — M810 Age-related osteoporosis without current pathological fracture: Secondary | ICD-10-CM | POA: Diagnosis not present

## 2018-09-13 ENCOUNTER — Other Ambulatory Visit: Payer: Self-pay

## 2018-09-13 DIAGNOSIS — M2550 Pain in unspecified joint: Secondary | ICD-10-CM

## 2018-09-13 DIAGNOSIS — G4762 Sleep related leg cramps: Secondary | ICD-10-CM

## 2018-09-13 MED ORDER — PRAMIPEXOLE DIHYDROCHLORIDE 0.25 MG PO TABS: ORAL_TABLET | ORAL | 1 refills | Status: DC

## 2018-10-10 DIAGNOSIS — E039 Hypothyroidism, unspecified: Secondary | ICD-10-CM | POA: Diagnosis not present

## 2018-10-10 DIAGNOSIS — M1990 Unspecified osteoarthritis, unspecified site: Secondary | ICD-10-CM | POA: Diagnosis not present

## 2018-10-10 DIAGNOSIS — M810 Age-related osteoporosis without current pathological fracture: Secondary | ICD-10-CM | POA: Diagnosis not present

## 2018-10-10 DIAGNOSIS — R768 Other specified abnormal immunological findings in serum: Secondary | ICD-10-CM | POA: Diagnosis not present

## 2018-10-10 DIAGNOSIS — M064 Inflammatory polyarthropathy: Secondary | ICD-10-CM | POA: Diagnosis not present

## 2018-10-12 LAB — BASIC METABOLIC PANEL
BUN: 17 (ref 4–21)
Creatinine: 0.7 (ref 0.5–1.1)
Potassium: 5 (ref 3.4–5.3)
Sodium: 144 (ref 137–147)

## 2018-10-12 LAB — CBC AND DIFFERENTIAL
HCT: 40 (ref 36–46)
Hemoglobin: 13.1 (ref 12.0–16.0)
Platelets: 252 (ref 150–399)

## 2018-10-12 LAB — HEPATIC FUNCTION PANEL
ALT: 32 (ref 7–35)
AST: 23 (ref 13–35)

## 2018-10-16 ENCOUNTER — Encounter: Payer: Self-pay | Admitting: Family Medicine

## 2018-10-16 ENCOUNTER — Encounter: Payer: Self-pay | Admitting: Rheumatology

## 2018-10-18 ENCOUNTER — Encounter (INDEPENDENT_AMBULATORY_CARE_PROVIDER_SITE_OTHER): Payer: Medicare HMO | Admitting: Ophthalmology

## 2018-10-20 DIAGNOSIS — R69 Illness, unspecified: Secondary | ICD-10-CM | POA: Diagnosis not present

## 2018-12-11 DIAGNOSIS — M1990 Unspecified osteoarthritis, unspecified site: Secondary | ICD-10-CM | POA: Diagnosis not present

## 2018-12-11 DIAGNOSIS — M2550 Pain in unspecified joint: Secondary | ICD-10-CM | POA: Diagnosis not present

## 2018-12-11 DIAGNOSIS — M0600 Rheumatoid arthritis without rheumatoid factor, unspecified site: Secondary | ICD-10-CM | POA: Diagnosis not present

## 2018-12-11 DIAGNOSIS — M810 Age-related osteoporosis without current pathological fracture: Secondary | ICD-10-CM | POA: Diagnosis not present

## 2018-12-11 DIAGNOSIS — R768 Other specified abnormal immunological findings in serum: Secondary | ICD-10-CM | POA: Diagnosis not present

## 2018-12-11 DIAGNOSIS — E039 Hypothyroidism, unspecified: Secondary | ICD-10-CM | POA: Diagnosis not present

## 2019-01-04 NOTE — Progress Notes (Signed)
[facility] Note  01/11/2019     CHIEF COMPLAINT Patient presents for Retina Follow Up   HISTORY OF PRESENT ILLNESS: Margaret [name] is a 85 y.o. female who presents to the clinic today for:   HPI    Retina Follow Up    Patient presents with  Dry AMD.  In both eyes.  This started weeks ago.  Severity is moderate.  Duration of months.  Since onset it is stable.  I, the attending physician,  performed the HPI with the patient and updated documentation appropriately.          Comments    Pt states her vision is stable in both eyes.  She has some irritation in both eyes but mostly in temporal corner of right eye.  Patient denies any new or worsening floaters or fol OU.       Last edited by Bernarda Caffey, MD on 01/11/2019  8:21 AM. (History)    Patient states she is doing well, she has not seen Dr. Kathlen Mody since her last visit here in May, pt is taking 200mg  of Plaquenil a day and 7.5mg  po prednisone every other day also, pt states her arthritis is doing well on these medications, pt is taking AREDS 2 and checks her amsler grid daily, pt states her right eye feels like it has something in it, but she can't find anything, she uses AT's TID    Referring physician: Valinda Party, MD [place],  [place] [address]   HISTORICAL INFORMATION:   Selected notes from the MEDICAL RECORD NUMBER Self referral for macular degeneration LEE: 03.21.19 (Dr. Park Liter in [place], [place]) [BCVA: OD: 20/40 OS: 20/40] Ocular Hx-POAG, non-exu ARMD, DES, pseudo, YAG PMH-astham, arthritis,     CURRENT MEDICATIONS: Current Outpatient Medications (Ophthalmic Drugs)  Medication Sig  . prednisoLONE acetate (PRED FORTE) 1 % ophthalmic suspension    No current facility-administered medications for this visit.  (Ophthalmic Drugs)   Current Outpatient Medications (Other)  Medication Sig  . albuterol (PROVENTIL HFA;VENTOLIN HFA) 108 (90 Base) MCG/ACT inhaler  Inhale 2 puffs into the lungs every 6 (six) hours as needed for wheezing or shortness of breath.  . fluticasone (FLONASE) 50 MCG/ACT nasal spray   . levothyroxine (SYNTHROID, LEVOTHROID) 50 MCG tablet Take 1 tablet (50 mcg total) by mouth daily before breakfast.  . meloxicam (MOBIC) 7.5 MG tablet Take 1 tablet (7.5 mg total) by mouth daily.  . montelukast (SINGULAIR) 10 MG tablet Take 1 tablet (10 mg total) by mouth at bedtime.  . Multiple Vitamins-Minerals (PRESERVISION AREDS 2) CAPS Take by mouth.  . pramipexole (MIRAPEX) 0.25 MG tablet TK 1 T PO 1 HOUR BEFORE BEDTIME  . predniSONE (DELTASONE) 10 MG tablet 20 mg po qdx 3 days, then 10 mg QD x6 days, then 5 mg QD for 4 days  . traMADol (ULTRAM) 50 MG tablet Take 1 tablet (50 mg total) by mouth every 12 (twelve) hours as needed for moderate pain.  . Vitamin D, Ergocalciferol, (DRISDOL) 1.25 MG (50000 UT) CAPS capsule Take 1 capsule (50,000 Units total) by mouth every 7 (seven) days.   No current facility-administered medications for this visit.  (Other)      REVIEW OF SYSTEMS: ROS    Positive for: Musculoskeletal, Endocrine, Eyes   Negative for: Constitutional, Gastrointestinal, Neurological, Skin, Genitourinary, HENT, Cardiovascular, Respiratory, Psychiatric, Allergic/Imm, Heme/Lymph   Last edited by Doneen Poisson on 01/11/2019  7:54 AM. (History)  ALLERGIES Allergies  Allergen Reactions  . Augmentin [Amoxicillin-Pot Clavulanate] Swelling and Rash    Swelling in face    PAST MEDICAL HISTORY Past Medical History:  Diagnosis Date  . Allergy   . Arthritis   . Arthritis of left knee   . Asthma   . Carotid artery stenosis 10/17/2012   stable report in 2018- "moderate on right, minimal on left"  . Carpal tunnel syndrome 2018   left.  . Cervical radiculopathy 05/16/2015   DDD C4-C5, C5-C6, and C6-C7.  Facet arthropathy throughout cervical spine.  Normal foraminal narrowing secondary to uncovertebral  arthropathy is seen  bilaterally at C3-C4 and C5/C6  . Chicken pox   . DDD (degenerative disc disease), lumbar 04/26/2015   Mild levoscoliosis, right anterior listhesis of L4 and L5.  DDD at every level.  Facet arthropathy at multiple levels.  . Deviated septum   . Glaucoma    both eyes, mild opened angle  . Heart murmur   . History of colon polyps   . History of fall   . History of frequent urinary tract infections    klebs- pansensitive. c. freundii - resitant to cefazolin and augementin  . Hypothyroidism   . Iron deficiency anemia    prior pcp told her to take Fe 325 QD  . Macular degeneration    h/o retinal edema  . Mitral valve prolapse 08/27/2011  . Murmur, cardiac 10/17/2012  . Osteoporosis   . 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 Pseudophakia of both eyes   . PVD (peripheral vascular disease) ([place])   . Rheumatic fever    85 years old   . Syncope 05/17/2017  . Thyroid disease   . Urinary incontinence   . Vertigo    had been prescribed antivert  . Vitamin D deficiency    Past Surgical History:  Procedure Laterality Date  . CARPAL TUNNEL RELEASE Bilateral 2019  . CATARACT EXTRACTION  1997  . colonoscopy   2010  . NASAL SEPTUM SURGERY  2007  . WRIST FRACTURE SURGERY Right 1996    FAMILY HISTORY Family History  Problem Relation Age of Onset  . Arthritis Mother   . COPD Mother   . Prostate cancer Father   . Arthritis Father   . Hearing loss Father   . Heart disease Father   . Heart attack Father   . Blindness Maternal Grandmother   . Stroke Maternal Grandmother   . Liver cancer Sister   . Arthritis Brother   . Prostate cancer Brother   . COPD Brother   . Heart disease Brother   . Breast cancer Daughter   . COPD Paternal Grandmother   . Breast cancer Paternal Grandmother     SOCIAL HISTORY Social History   Tobacco Use  . Smoking status: Former Smoker    Packs/day: 0.50    Types: Cigarettes    Start date: 61    Quit date: 1965    Years since quitting: 55.9  .  Smokeless tobacco: Never Used  Substance Use Topics  . Alcohol use: Yes    Alcohol/week: 1.0 standard drinks    Types: 1 Glasses of wine per week    Comment: 2-3 times weekly   . Drug use: Never         OPHTHALMIC EXAM:  Base Eye Exam    Visual Acuity (Snellen - Linear)      Right Left   Dist cc 20/25 -1 20/25 +1   Dist  ph cc NI NI   Correction: Glasses       Tonometry (Tonopen, 7:56 AM)      Right Left   Pressure 15 15       Pupils      Dark Light Shape React APD   Right 3 2 Round Minimal 0   Left 3 2 Round Minimal 0       Visual Fields      Left Right    Full Full       Extraocular Movement      Right Left    Full Full       Neuro/Psych    Oriented x3: Yes   Mood/Affect: Normal       Dilation    Both eyes: 1.0% Mydriacyl, 2.5% Phenylephrine @ 7:56 AM        Slit Lamp and Fundus Exam    Slit Lamp Exam      Right Left   Lids/Lashes Dermatochalasis - upper lid, Telangiectasia Dermatochalasis - upper lid, Telangiectasia   Conjunctiva/Sclera Temporal Pinguecula White and quiet   Cornea Arcus, 2-3+ Punctate epithelial erosions, Debris in tear film Arcus, 2-3+ Punctate epithelial erosions   Anterior Chamber Deep and quiet, narrow temporal angle Deep and quiet   Iris Round and dilated Round and dilated   Lens PC IOL in good position with open PC PC IOL in good position with open PC   Vitreous Vitreous syneresis Vitreous syneresis       Fundus Exam      Right Left   Disc 360 Peripapillary atrophy Peripapillary atrophy, difficult to assess rim   C/D Ratio 0.6 0.2   Macula Flat, Blunted foveal reflex, drusen, RPE mottling and clumping, no heme or edema Flat, Blunted foveal reflex, drusen, RPE mottling and clumping, no heme or edema   Vessels Vascular attenuation, Tortuous Vascular attenuation   Periphery Attached, scattered Reticular degeneration, pigmented cystoid degeneration temporally, No heme  Attached, scattered Reticular degeneration, No heme          Refraction    Wearing Rx      Sphere Cylinder Axis Add   Right -0.75 +2.25 175 +2.75   Left -0.25 +1.25 165 +2.75          IMAGING AND PROCEDURES  Imaging and Procedures for @TODAY @  OCT, Retina - OU - Both Eyes       Right Eye Quality was good. Central Foveal Thickness: 308. Progression has been stable. Findings include normal foveal contour, no IRF, no SRF, retinal drusen , outer retinal atrophy, epiretinal membrane (Partial PVD).   Left Eye Quality was good. Central Foveal Thickness: 309. Progression has been stable. Findings include normal foveal contour, no IRF, no SRF, retinal drusen , outer retinal atrophy, epiretinal membrane (Trace ERM).   Notes *Images captured and stored on drive  Diagnosis / Impression:  Non-Exudative ARMD OU--no change from prior  Clinical management:  See below  Abbreviations: NFP - Normal foveal profile. CME - cystoid macular edema. PED - pigment epithelial detachment. IRF - intraretinal fluid. SRF - subretinal fluid. EZ - ellipsoid zone. ERM - epiretinal membrane. ORA - outer retinal atrophy. ORT - outer retinal tubulation. SRHM - subretinal hyper-reflective material                  ASSESSMENT/PLAN:    ICD-10-CM   1. Intermediate stage nonexudative age-related macular degeneration of both eyes  H35.3132   2. Long-term use of Plaquenil  Z79.899   3. Retinal edema  H35.81 OCT, Retina - OU - Both Eyes  4. Pseudophakia of both eyes  Z96.1   5. Primary open angle glaucoma (POAG) of both eyes, mild stage  H40.1131   6. Dry eyes  H04.123     1. Age related macular degeneration, non-exudative, both eyes  - mild-intermediate stage-no change from previous visit  - The incidence, anatomy, and pathology of dry AMD, risk of progression, and the AREDS and AREDS 2 study including smoking risks discussed with patient.  - Recommend amsler grid monitoring  - f/u 6-8 months-call sooner if changes in amsler grid  2. Plaquenil use for  RA  - baseline testing done 05.20.20 -- started plaquenil early May 2020 for RA  - HVF 10-2 showed non-specific defects OU -- essentially normal baseline test  - OCT shows stable retinal abnormalities secondary to ARMD -- stable  - med list states taking 300 mg/day, but pt reports only taking 200mg  / day per Dr. Audelia Hives instructions --> 3.29 mg/kg body wt per day  - tapering po prednisone, currently on 7.5mg  every other day  - the American Academy of Ophthalmology recommends dosing 5mg /kg per day to reduce risk of retinal toxicity  - recommend consideration of using alternate medication for RA especially given co-morbidity of age-related macular degeneration OU  - managed by [facility] Dossie [facility]  3. No retinal edema on exam or OCT  4. Pseudophakia OU  - s/p CE/IOL w/ ?canaloplasty OU by Dr. Robyne Askew at [facility]  - doing well  - monitor  5. History of Glaucoma-   - not currently on gtts -- IOP 15 OU  - ?underwent canaloplasty in conjunction with CEIOL at [facility] clinic  - referred to and now under the expert care of Dr. Kathlen Mody -- initial visit/consult 10.3.2019  6. Dry Eye Syndrome  -use AT's tid OU  Ophthalmic Meds Ordered this visit:  No orders of the defined types were placed in this encounter.      Return for f/u 6-9 months, exu ARMD OU, DFE, OCT.  There are no Patient Instructions on file for this visit.   Explained the diagnoses, plan, and follow up with the patient and they expressed understanding.  Patient expressed understanding of the importance of proper follow up care.    This document serves as a record of services personally performed by Gardiner Sleeper, MD, PhD. It was created on their behalf by Leeann Must, [place], a certified ophthalmic assistant. The creation of this record is the provider's dictation and/or activities during the visit.    Electronically signed by: Leeann Must, COA @TODAY @ 1:41 PM   Gardiner Sleeper, M.D.,  Ph.D. Diseases & Surgery of the Retina and Vitreous [facility]  I have reviewed the above documentation for accuracy and completeness, and I agree with the above. Gardiner Sleeper, M.D., Ph.D. 01/11/19 1:41 PM    Abbreviations: M myopia (nearsighted); A astigmatism; H hyperopia (farsighted); P presbyopia; Mrx spectacle prescription;  CTL contact lenses; OD right eye; OS left eye; OU both eyes  XT exotropia; ET esotropia; PEK punctate epithelial keratitis; PEE punctate epithelial erosions; DES dry eye syndrome; MGD meibomian gland dysfunction; ATs artificial tears; PFAT's preservative free artificial tears; [place] nuclear sclerotic cataract; PSC posterior subcapsular cataract; ERM epi-retinal membrane; PVD posterior vitreous detachment; RD retinal detachment; DM diabetes mellitus; DR diabetic retinopathy; NPDR non-proliferative diabetic retinopathy; PDR proliferative diabetic retinopathy; CSME clinically significant macular edema; DME diabetic macular edema; dbh dot blot hemorrhages;  CWS cotton wool spot; POAG primary open angle glaucoma; C/D cup-to-disc ratio; HVF humphrey visual field; GVF goldmann visual field; OCT optical coherence tomography; IOP intraocular pressure; BRVO Branch retinal vein occlusion; CRVO central retinal vein occlusion; CRAO central retinal artery occlusion; BRAO branch retinal artery occlusion; RT retinal tear; SB scleral buckle; PPV pars plana vitrectomy; VH Vitreous hemorrhage; PRP panretinal laser photocoagulation; IVK intravitreal kenalog; VMT vitreomacular traction; MH Macular hole;  NVD neovascularization of the disc; NVE neovascularization elsewhere; AREDS age related eye disease study; ARMD age related macular degeneration; POAG primary open angle glaucoma; EBMD epithelial/anterior basement membrane dystrophy; ACIOL anterior chamber intraocular lens; IOL intraocular lens; PCIOL posterior chamber intraocular lens; Phaco/IOL phacoemulsification with  intraocular lens placement; [place] photorefractive keratectomy; LASIK laser assisted in situ keratomileusis; HTN hypertension; DM diabetes mellitus; COPD chronic obstructive pulmonary disease

## 2019-01-11 ENCOUNTER — Encounter (INDEPENDENT_AMBULATORY_CARE_PROVIDER_SITE_OTHER): Payer: Self-pay | Admitting: Ophthalmology

## 2019-01-11 ENCOUNTER — Ambulatory Visit (INDEPENDENT_AMBULATORY_CARE_PROVIDER_SITE_OTHER): Payer: Medicare HMO | Admitting: Ophthalmology

## 2019-01-11 DIAGNOSIS — Z79899 Other long term (current) drug therapy: Secondary | ICD-10-CM

## 2019-01-11 DIAGNOSIS — H04123 Dry eye syndrome of bilateral lacrimal glands: Secondary | ICD-10-CM

## 2019-01-11 DIAGNOSIS — H353132 Nonexudative age-related macular degeneration, bilateral, intermediate dry stage: Secondary | ICD-10-CM | POA: Diagnosis not present

## 2019-01-11 DIAGNOSIS — Z961 Presence of intraocular lens: Secondary | ICD-10-CM

## 2019-01-11 DIAGNOSIS — H3581 Retinal edema: Secondary | ICD-10-CM

## 2019-01-11 DIAGNOSIS — H401131 Primary open-angle glaucoma, bilateral, mild stage: Secondary | ICD-10-CM | POA: Diagnosis not present

## 2019-02-27 DIAGNOSIS — D485 Neoplasm of uncertain behavior of skin: Secondary | ICD-10-CM | POA: Diagnosis not present

## 2019-02-27 DIAGNOSIS — C441222 Squamous cell carcinoma of skin of right lower eyelid, including canthus: Secondary | ICD-10-CM | POA: Diagnosis not present

## 2019-02-27 DIAGNOSIS — L570 Actinic keratosis: Secondary | ICD-10-CM | POA: Diagnosis not present

## 2019-02-27 DIAGNOSIS — Z23 Encounter for immunization: Secondary | ICD-10-CM | POA: Diagnosis not present

## 2019-02-27 DIAGNOSIS — M810 Age-related osteoporosis without current pathological fracture: Secondary | ICD-10-CM | POA: Diagnosis not present

## 2019-03-25 ENCOUNTER — Ambulatory Visit: Payer: Medicare HMO

## 2019-03-30 ENCOUNTER — Ambulatory Visit: Payer: Medicare HMO

## 2019-03-30 ENCOUNTER — Ambulatory Visit: Payer: Medicare HMO | Attending: Internal Medicine

## 2019-03-30 DIAGNOSIS — Z23 Encounter for immunization: Secondary | ICD-10-CM | POA: Insufficient documentation

## 2019-03-30 NOTE — Progress Notes (Signed)
   Covid-19 Vaccination Clinic  Name:  Shoshanah Amoah    MRN: [id number] DOB: 1930/01/31  03/30/2019  Ms. Seever was observed post Covid-19 immunization for 15 minutes without incidence. She was provided with Vaccine Information Sheet and instruction to access the V-Safe system.   Ms. Alano was instructed to call 911 with any severe reactions post vaccine: Marland Kitchen Difficulty breathing  . Swelling of your face and throat  . A fast heartbeat  . A bad rash all over your body  . Dizziness and weakness    Immunizations Administered    Name Date Dose VIS Date Route   Pfizer COVID-19 Vaccine 03/30/2019 10:36 AM 0.3 mL 02/02/2019 Intramuscular   Manufacturer: [place]   Lot: [id number]   [place]: [id number]

## 2019-04-05 ENCOUNTER — Other Ambulatory Visit: Payer: Self-pay | Admitting: Family Medicine

## 2019-04-10 LAB — HEPATIC FUNCTION PANEL
ALT: 35 (ref 7–35)
AST: 25 (ref 13–35)

## 2019-04-10 LAB — CBC: RBC: 4.38 (ref 3.87–5.11)

## 2019-04-10 LAB — CBC AND DIFFERENTIAL
HCT: 39 (ref 36–46)
Hemoglobin: 13.4 (ref 12.0–16.0)
WBC: 7.4

## 2019-04-10 LAB — BASIC METABOLIC PANEL: BUN: 15 (ref 4–21)

## 2019-04-24 ENCOUNTER — Ambulatory Visit: Payer: Medicare HMO | Attending: Internal Medicine

## 2019-04-24 DIAGNOSIS — Z23 Encounter for immunization: Secondary | ICD-10-CM

## 2019-04-24 NOTE — Progress Notes (Signed)
   Covid-19 Vaccination Clinic  Name:  Margaret [name]    MRN: [id number] DOB: 07-Sep-1929  04/24/2019  [name] was observed post Covid-19 immunization for 15 minutes without incident. She was provided with Vaccine Information Sheet and instruction to access the V-Safe system.   [name] was instructed to call 911 with any severe reactions post vaccine: Marland Kitchen Difficulty breathing  . Swelling of face and throat  . A fast heartbeat  . A bad rash all over body  . Dizziness and weakness   Immunizations Administered    Name Date Dose VIS Date Route   Pfizer COVID-19 Vaccine 04/24/2019 10:32 AM 0.3 mL 02/02/2019 Intramuscular   Manufacturer: [place]   Lot: [id number]   [place]: [id number]

## 2019-05-01 ENCOUNTER — Encounter: Payer: Self-pay | Admitting: Family Medicine

## 2019-05-09 ENCOUNTER — Other Ambulatory Visit: Payer: Self-pay | Admitting: Family Medicine

## 2019-06-07 ENCOUNTER — Other Ambulatory Visit: Payer: Self-pay | Admitting: Family Medicine

## 2019-07-10 LAB — CBC AND DIFFERENTIAL
HCT: 39 (ref 36–46)
Hemoglobin: 13.2 (ref 12.0–16.0)
Platelets: 217 (ref 150–399)
WBC: 6.2

## 2019-07-10 LAB — BASIC METABOLIC PANEL
BUN: 17 (ref 4–21)
CO2: 33 — AB (ref 13–22)
Chloride: 103 (ref 99–108)
Creatinine: 0.8 (ref 0.5–1.1)
Glucose: 95
Potassium: 4.3 (ref 3.4–5.3)
Sodium: 141 (ref 137–147)

## 2019-07-10 LAB — HEPATIC FUNCTION PANEL
ALT: 38 — AB (ref 7–35)
AST: 28 (ref 13–35)
Alkaline Phosphatase: 56 (ref 25–125)
Bilirubin, Total: 0.6

## 2019-07-10 LAB — COMPREHENSIVE METABOLIC PANEL
Albumin: 4.5 (ref 3.5–5.0)
Calcium: 9.6 (ref 8.7–10.7)
GFR calc Af Amer: 86.84
GFR calc non Af Amer: 71.77
Globulin: 2.1

## 2019-07-10 LAB — CBC: RBC: 4.18 (ref 3.87–5.11)

## 2019-07-10 LAB — POCT ERYTHROCYTE SEDIMENTATION RATE, NON-AUTOMATED: Sed Rate: 6

## 2019-07-11 ENCOUNTER — Other Ambulatory Visit: Payer: Self-pay | Admitting: Family Medicine

## 2019-07-12 NOTE — Telephone Encounter (Signed)
Patient contacted to schedule f/u [facility] appt with PCP. Advised a short term supply could be sent until appointment. Patient voiced understanding, appt scheduled.

## 2019-07-30 ENCOUNTER — Encounter: Payer: Self-pay | Admitting: Family Medicine

## 2019-07-30 ENCOUNTER — Other Ambulatory Visit: Payer: Self-pay

## 2019-07-30 ENCOUNTER — Ambulatory Visit: Payer: Medicare HMO | Admitting: Family Medicine

## 2019-07-30 VITALS — BP 138/87 | HR 70 | Temp 97.3°F | Resp 19 | Ht 60.0 in | Wt 131.2 lb

## 2019-07-30 DIAGNOSIS — E039 Hypothyroidism, unspecified: Secondary | ICD-10-CM

## 2019-07-30 DIAGNOSIS — M2550 Pain in unspecified joint: Secondary | ICD-10-CM

## 2019-07-30 DIAGNOSIS — M069 Rheumatoid arthritis, unspecified: Secondary | ICD-10-CM

## 2019-07-30 DIAGNOSIS — E559 Vitamin D deficiency, unspecified: Secondary | ICD-10-CM

## 2019-07-30 DIAGNOSIS — J302 Other seasonal allergic rhinitis: Secondary | ICD-10-CM

## 2019-07-30 DIAGNOSIS — M353 Polymyalgia rheumatica: Secondary | ICD-10-CM

## 2019-07-30 DIAGNOSIS — G4762 Sleep related leg cramps: Secondary | ICD-10-CM

## 2019-07-30 LAB — T4, FREE: Free T4: 1.03 ng/dL (ref 0.60–1.60)

## 2019-07-30 LAB — VITAMIN D 25 HYDROXY (VIT D DEFICIENCY, FRACTURES): VITD: 48.07 ng/mL (ref 30.00–100.00)

## 2019-07-30 LAB — TSH: TSH: 1.92 u[IU]/mL (ref 0.35–4.50)

## 2019-07-30 MED ORDER — MONTELUKAST SODIUM 10 MG PO TABS: 10.0000 mg | ORAL_TABLET | Freq: Every day | ORAL | 1 refills | Status: DC

## 2019-07-30 MED ORDER — PRAMIPEXOLE DIHYDROCHLORIDE 0.25 MG PO TABS: ORAL_TABLET | ORAL | 1 refills | Status: DC

## 2019-07-30 MED ORDER — LEVOTHYROXINE SODIUM 50 MCG PO TABS: 50.0000 ug | ORAL_TABLET | Freq: Every day | ORAL | 3 refills | Status: DC

## 2019-07-30 NOTE — Patient Instructions (Signed)
I am glad you  Are doing better.  I will call you with lab results.  I have refilled your medications.     Polymyalgia Rheumatica Polymyalgia rheumatica (PMR) is an inflammatory disorder that causes the muscles and joints to ache and become stiff. Sometimes, PMR leads to a more dangerous condition that can cause vision loss (temporal arteritis or giant cell arteritis). What are the causes? The exact cause of PMR is not known. What increases the risk? You are more likely to develop this condition if you are:  Female.  26 years of age or older.  Caucasian. What are the signs or symptoms? Pain and stiffness are the main symptoms of PMR. Symptoms may:  Be worse after inactivity and in the morning.  Affect your: ? Hips, buttocks, and thighs. ? Neck, arms, and shoulders. This can make it hard to raise your arms above your head. ? Hands and wrists. Other symptoms include:  Fever.  Tiredness.  Weakness.  Depression.  Decreased appetite. This may lead to weight loss. Symptoms may start slowly or suddenly. How is this diagnosed? This condition is diagnosed with your medical history and a physical exam. You may need to see a health care provider who specializes in diseases of the joints, muscles, and bones (rheumatologist). You may also have tests, including:  Blood tests.  X-rays.  Ultrasound. How is this treated? PMR usually goes away without treatment, but it may take years. Your health care provider may recommend low-dose steroids and other medicines to help manage your symptoms of pain and stiffness. Regular exercise and rest will also help your symptoms. Follow these instructions at home:   Take over-the-counter and prescription medicines only as told by your health care provider.  Make sure to get enough rest and sleep.  Eat a healthy and nutritious diet.  Try to exercise most days of the week. Ask your health care provider what type of exercise is best for  you.  Keep all follow-up visits as told by your health care provider. This is important. Contact a health care provider if:  Your symptoms do not improve with medicine.  You have side effects from steroids. These may include: ? Weight gain. ? Swelling. ? Insomnia. ? Mood changes. ? Bruising. ? High blood sugar readings, if you have diabetes. ? Higher than normal blood pressure readings, if you monitor your blood pressure. Get help right away if:  You develop symptoms of temporal arteritis, such as: ? A change in vision. ? Severe headache. ? Scalp pain. ? Jaw pain. Summary  Polymyalgia rheumatica is an inflammatory disorder that causes aching and stiffness in your muscles and joints.  The exact cause of this condition is not known.  This condition usually goes away without treatment. Your health care provider may give you low-dose steroids to help manage your pain and stiffness.  Rest and regular exercise will help the symptoms. This information is not intended to replace advice given to you by your health care provider. Make sure you discuss any questions you have with your health care provider. Document Revised: 12/15/2017 Document Reviewed: 12/15/2017 Elsevier Patient Education  2020 [employer].

## 2019-07-30 NOTE — Progress Notes (Signed)
This visit occurred during the SARS-CoV-2 public health emergency.  Safety protocols were in place, including screening questions prior to the visit, additional usage of staff PPE, and extensive cleaning of exam room while observing appropriate contact time as indicated for disinfecting solutions.    Margaret [name] , 1929-09-27, 86 y.o., female MRN: [id number] Patient Care Team    Relationship Specialty Notifications Start End  Ma Hillock, DO PCP - General Family Medicine  03/08/19   Bernarda Caffey, MD Consulting Physician Ophthalmology  03/29/18   Konrad Felix, MD Consulting Physician Ophthalmology  03/29/18   Devra Dopp, MD Referring Physician Dermatology  08/02/19   Valinda Party, MD  Rheumatology  08/02/19     Chief Complaint  Patient presents with  . Hypothyroidism    Pt needs refills on meds.      Subjective: Pt presents for an OV for follow up in [facility] PMR/RA: Patient reports she is much improved from when she last saw provider.  She is established with rheumatology.  She is on low-dose prednisone, Plaquenil, folate and methotrexate. Prior note: Patient presents today to discuss her arthralgias and myalgias.  Symptoms are worsening.  She reports increased symptoms in her left shoulder her left hand and her left hip.  Her symptoms are bilateral, worse on the left side. Her CMP, CRP, ESR, TSH, CBC and Lyme titers were all normal. Her ANA was positive, 1:640 nuclear homogenous. CCP and RF normal.  Hypothyroidism, unspecified type Patient reports compliance with levothyroxine 50 mcg daily.  Labs are due today.  Nocturnal leg cramps Patient reports he had forgotten about the Mirapex she had been on.  She would like to restart this medication.  She has been prescribed Mirapex 0.25 mg nightly by her prior PCP for nocturnal leg cramping.  She feels this has worked well for her. Vitamin D insufficiency: Patient had completed the over-the-counter vitamin D after  abnormal labs April 2020.  She has started a daily vitamin D.  Labs have not been rechecked.  Seasonal allergies/Mild intermittent asthma, unspecified whether complicated Patient reports she has asthma and allergies.    Patient reports compliance with Singulair and her symptoms are well controlled.  She also uses Flonase and albuterol as needed. Depression screen [facility] 2/9 03/29/2018  Decreased Interest 0  Down, Depressed, Hopeless 0  PHQ - 2 Score 0    Allergies  Allergen Reactions  . Augmentin [Amoxicillin-Pot Clavulanate] Swelling and Rash    Swelling in face   Social History   Social History Narrative   Marital status/children/pets: Widowed.  Moved to [place] from [place] 2019.  She shares a home with 1 of her daughters.   Education/employment: Buyer, retail of arts degree, retired Marine scientist:      -smoke alarm in the home:Yes     - wears seatbelt: Yes     - Feels safe in their relationships: Yes   Past Medical History:  Diagnosis Date  . Allergy   . Arthritis   . Arthritis of left knee   . Asthma   . Carotid artery stenosis 10/17/2012   stable report in 2018- "moderate on right, minimal on left"  . Carpal tunnel syndrome 2018   left.  . Cervical radiculopathy 05/16/2015   DDD C4-C5, C5-C6, and C6-C7.  Facet arthropathy throughout cervical spine.  Normal foraminal narrowing secondary to uncovertebral  arthropathy is seen bilaterally at C3-C4 and C5/C6  . Chicken pox   . DDD (degenerative disc  disease), lumbar 04/26/2015   Mild levoscoliosis, right anterior listhesis of L4 and L5.  DDD at every level.  Facet arthropathy at multiple levels.  . Deviated septum   . Glaucoma    both eyes, mild opened angle  . Heart murmur   . History of colon polyps   . History of fall   . History of frequent urinary tract infections    klebs- pansensitive. c. freundii - resitant to cefazolin and augementin  . Hypothyroidism   . Iron deficiency anemia    prior pcp told her to take  Fe 325 QD  . Macular degeneration    h/o retinal edema  . Mitral valve prolapse 08/27/2011  . Murmur, cardiac 10/17/2012  . Osteoporosis   . 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 Pseudophakia of both eyes   . PVD (peripheral vascular disease) ([place])   . Rheumatic fever    86 years old   . Syncope 05/17/2017  . Thyroid disease   . Urinary incontinence   . Vertigo    had been prescribed antivert  . Vitamin D deficiency    Past Surgical History:  Procedure Laterality Date  . CARPAL TUNNEL RELEASE Bilateral 2019  . CATARACT EXTRACTION  1997  . colonoscopy   2010  . NASAL SEPTUM SURGERY  2007  . WRIST FRACTURE SURGERY Right 1996   Family History  Problem Relation Age of Onset  . Arthritis Mother   . COPD Mother   . Prostate cancer Father   . Arthritis Father   . Hearing loss Father   . Heart disease Father   . Heart attack Father   . Blindness Maternal Grandmother   . Stroke Maternal Grandmother   . Liver cancer Sister   . Arthritis Brother   . Prostate cancer Brother   . COPD Brother   . Heart disease Brother   . Breast cancer Daughter   . COPD Paternal Grandmother   . Breast cancer Paternal Grandmother    Allergies as of 07/30/2019      Reactions   Augmentin [amoxicillin-pot Clavulanate] Swelling, Rash   Swelling in face      Medication List       Accurate as of July 30, 2019 11:59 PM. If you have any questions, ask your nurse or doctor.        STOP taking these medications   meloxicam 7.5 MG tablet Commonly known as: MOBIC Stopped by: Howard Pouch, DO   traMADol 50 MG tablet Commonly known as: ULTRAM Stopped by: Howard Pouch, DO   Vitamin D (Ergocalciferol) 1.25 MG (50000 UNIT) Caps capsule Commonly known as: DRISDOL Stopped by: Howard Pouch, DO     TAKE these medications   albuterol 108 (90 Base) MCG/ACT inhaler Commonly known as: VENTOLIN HFA Inhale 2 puffs into the lungs every 6 (six) hours as needed for wheezing or shortness of breath.    fluticasone 50 MCG/ACT nasal spray Commonly known as: FLONASE   folic acid 0.5 MG tablet Commonly known as: FOLVITE Take 0.5 mg by mouth daily.   hydroxychloroquine 200 MG tablet Commonly known as: PLAQUENIL Take 300 mg by mouth daily.   levothyroxine 50 MCG tablet Commonly known as: SYNTHROID Take 1 tablet (50 mcg total) by mouth daily before breakfast. What changed: See the new instructions. Changed by: Howard Pouch, DO   methotrexate 2.5 MG tablet Commonly known as: RHEUMATREX Take 10 mg by mouth once a week. 4 tablets once a week  montelukast 10 MG tablet Commonly known as: SINGULAIR Take 1 tablet (10 mg total) by mouth at bedtime.   pramipexole 0.25 MG tablet Commonly known as: MIRAPEX TK 1 T PO 1 HOUR BEFORE BEDTIME   prednisoLONE acetate 1 % ophthalmic suspension Commonly known as: PRED FORTE   predniSONE 5 MG tablet Commonly known as: DELTASONE 5 mg. What changed: Another medication with the same name was removed. Continue taking this medication, and follow the directions you see here. Changed by: Howard Pouch, DO   PreserVision AREDS 2 Caps Take by mouth.       All past medical history, surgical history, allergies, family history, immunizations andmedications were updated in the EMR today and reviewed under the history and medication portions of their EMR.     ROS: Negative, with the exception of above mentioned in HPI   Objective:  BP 138/87 (BP Location: Right Arm, Patient Position: Sitting, Cuff Size: Normal)   Pulse 70   Temp (!) 97.3 F (36.3 C) (Temporal)   Resp 19   Ht 5' (1.524 m)   Wt 131 lb 4 oz (59.5 kg)   SpO2 98%   BMI 25.63 kg/m  Body mass index is 25.63 kg/m. Gen: Afebrile. No acute distress. Nontoxic in appearance, well developed, well nourished.  HENT: AT. .  No cough.  No hoarseness. Eyes:Pupils Equal Round Reactive to light, Extraocular movements intact,  Conjunctiva without redness, discharge or  icterus. Neck/lymp/endocrine: Supple, no lymphadenopathy CV: RRR, no edema Chest: CTAB, no wheeze or crackles. Good air movement, normal resp effort.  Skin: No rashes, purpura or petechiae.  Neuro:  Normal gait. PERLA. EOMi. Alert. Oriented x3  Psych: Normal affect, dress and demeanor. Normal speech. Normal thought content and judgment.  No exam data present No results found. No results found for this or any previous visit (from the past 24 hour(s)).  Assessment/Plan: Lolamae Voisin is a 86 y.o. female present for OV for  Hypothyroidism, unspecified type Has been stable on levothyroxine 50 mcg daily.  We will recheck labs and refill medication once laboratory results received. - TSH - T4, free  Vit d def/osteoporosis:  prolia injections are provided by rheumatology.  Vitamin D levels collected today.  Continue daily supplement.  Rheumatoid arthritis/PMR: - Now established with rheumatology and prescribed prednisone, methotrexate, folate and Plaquenil.  Her symptoms are greatly improved. Continue routine follow-ups with rheumatology  Nocturnal leg cramps Stable.   Continue Mirapex  Seasonal allergies/Mild intermittent asthma, unspecified whether complicated -Stable.   -Continue Singulair  -Continue albuterol as needed  -Continue Flonase   Patient was encouraged to monitor her blood pressure.  Goal is less than 140/90 given age.  She is on prednisone which may be increasing her blood pressure above her normal.  She is aware of blood pressures rise over goal she needs to follow-up sooner.   Reviewed expectations re: course of current medical issues.  Discussed self-management of symptoms.  Outlined signs and symptoms indicating need for more acute intervention.  Patient verbalized understanding and all questions were answered.  Patient received an After-Visit Summary.    Orders Placed This Encounter  Procedures  . TSH  . T4, free  . Vitamin D (25 hydroxy)    Meds ordered this encounter  Medications  . pramipexole (MIRAPEX) 0.25 MG tablet    Sig: TK 1 T PO 1 HOUR BEFORE BEDTIME    Dispense:  90 tablet    Refill:  1  . montelukast (SINGULAIR) 10 MG tablet  Sig: Take 1 tablet (10 mg total) by mouth at bedtime.    Dispense:  90 tablet    Refill:  1  . levothyroxine (SYNTHROID) 50 MCG tablet    Sig: Take 1 tablet (50 mcg total) by mouth daily before breakfast.    Dispense:  90 tablet    Refill:  3   Referral Orders  No referral(s) requested today     Note is dictated utilizing voice recognition software. Although note has been proof read prior to signing, occasional typographical errors still can be missed. If any questions arise, please do not hesitate to call for verification.   electronically signed by:  Howard Pouch, DO  [place]

## 2019-08-02 DIAGNOSIS — M0600 Rheumatoid arthritis without rheumatoid factor, unspecified site: Secondary | ICD-10-CM | POA: Insufficient documentation

## 2019-08-02 DIAGNOSIS — M353 Polymyalgia rheumatica: Secondary | ICD-10-CM | POA: Insufficient documentation

## 2019-09-12 ENCOUNTER — Encounter: Payer: Self-pay | Admitting: Family Medicine

## 2019-10-08 NOTE — Progress Notes (Signed)
[facility] Note  10/11/2019     CHIEF COMPLAINT Patient presents for Retina Follow Up   HISTORY OF PRESENT ILLNESS: Margaret [name] is a 86 y.o. female who presents to the clinic today for:   HPI    Retina Follow Up    Patient presents with  Dry AMD.  In both eyes.  Duration of 9 months.  Since onset it is stable.  I, the attending physician,  performed the HPI with the patient and updated documentation appropriately.          Comments    9 month follow up ARMD OU- Vision appears stable since last visit OU.  Using Systane BID OU. Taking Plaquenil 200mg  qd for a little over 1 year.       Last edited by Bernarda Caffey, MD on 10/11/2019  8:34 AM. (History)    Patient states she is still taking AREDS 2, she is taking methotrexate (10mg  once a week) now along with Plaquenil, she is taking 2.5mg  of oral Prednisone every 3 days, pt states she saw Dr. Kathlen Mody about 2 months ago, they are working on getting her dry eyes under control before giving her a new glasses rx  Referring physician: Hortencia Pilar, MD [place],  [place] [address]   HISTORICAL INFORMATION:   Selected notes from the [facility] referral for macular degeneration LEE: 03.21.19 (Dr. Park Liter in [place], [place]) [BCVA: OD: 20/40 OS: 20/40] Ocular Hx-POAG, non-exu ARMD, DES, pseudo, YAG PMH-astham, arthritis,     CURRENT MEDICATIONS: Current Outpatient Medications (Ophthalmic Drugs)  Medication Sig  . prednisoLONE acetate (PRED FORTE) 1 % ophthalmic suspension    No current facility-administered medications for this visit. (Ophthalmic Drugs)   Current Outpatient Medications (Other)  Medication Sig  . albuterol (PROVENTIL HFA;VENTOLIN HFA) 108 (90 Base) MCG/ACT inhaler Inhale 2 puffs into the lungs every 6 (six) hours as needed for wheezing or shortness of breath.  . fluticasone (FLONASE) 50 MCG/ACT nasal spray   . folic acid (FOLVITE) 0.5 MG tablet  Take 0.5 mg by mouth daily.  . hydroxychloroquine (PLAQUENIL) 200 MG tablet Take 300 mg by mouth daily.  Marland Kitchen levothyroxine (SYNTHROID) 50 MCG tablet Take 1 tablet (50 mcg total) by mouth daily before breakfast.  . methotrexate (RHEUMATREX) 2.5 MG tablet Take 10 mg by mouth once a week. 4 tablets once a week  . montelukast (SINGULAIR) 10 MG tablet Take 1 tablet (10 mg total) by mouth at bedtime.  . Multiple Vitamins-Minerals (PRESERVISION AREDS 2) CAPS Take by mouth.  . pramipexole (MIRAPEX) 0.25 MG tablet TK 1 T PO 1 HOUR BEFORE BEDTIME  . predniSONE (DELTASONE) 5 MG tablet 5 mg.   No current facility-administered medications for this visit. (Other)      REVIEW OF SYSTEMS: ROS    Positive for: Musculoskeletal, Endocrine, Eyes   Negative for: Constitutional, Gastrointestinal, Neurological, Skin, Genitourinary, HENT, Cardiovascular, Respiratory, Psychiatric, Allergic/Imm, Heme/Lymph   Last edited by Leonie Douglas, COA on 10/11/2019  8:07 AM. (History)       ALLERGIES Allergies  Allergen Reactions  . Augmentin [Amoxicillin-Pot Clavulanate] Swelling and Rash    Swelling in face    PAST MEDICAL HISTORY Past Medical History:  Diagnosis Date  . Allergy   . Arthritis   . Arthritis of left knee   . Asthma   . Carotid artery stenosis 10/17/2012   stable report in 2018- "moderate on right, minimal on left"  . Carpal  tunnel syndrome 2018   left.  . Cervical radiculopathy 05/16/2015   DDD C4-C5, C5-C6, and C6-C7.  Facet arthropathy throughout cervical spine.  Normal foraminal narrowing secondary to uncovertebral  arthropathy is seen bilaterally at C3-C4 and C5/C6  . Chicken pox   . DDD (degenerative disc disease), lumbar 04/26/2015   Mild levoscoliosis, right anterior listhesis of L4 and L5.  DDD at every level.  Facet arthropathy at multiple levels.  . Deviated septum   . Glaucoma    both eyes, mild opened angle  . Heart murmur   . History of colon polyps   . History of fall   .  History of frequent urinary tract infections    klebs- pansensitive. c. freundii - resitant to cefazolin and augementin  . Hypothyroidism   . Iron deficiency anemia    prior pcp told her to take Fe 325 QD  . Macular degeneration    h/o retinal edema  . Mitral valve prolapse 08/27/2011  . Murmur, cardiac 10/17/2012  . Osteoporosis   . 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 Pseudophakia of both eyes   . PVD (peripheral vascular disease) ([place])   . Rheumatic fever    86 years old   . Syncope 05/17/2017  . Thyroid disease   . Urinary incontinence   . Vertigo    had been prescribed antivert  . Vitamin D deficiency    Past Surgical History:  Procedure Laterality Date  . CARPAL TUNNEL RELEASE Bilateral 2019  . CATARACT EXTRACTION  1997  . colonoscopy   2010  . NASAL SEPTUM SURGERY  2007  . WRIST FRACTURE SURGERY Right 1996    FAMILY HISTORY Family History  Problem Relation Age of Onset  . Arthritis Mother   . COPD Mother   . Prostate cancer Father   . Arthritis Father   . Hearing loss Father   . Heart disease Father   . Heart attack Father   . Blindness Maternal Grandmother   . Stroke Maternal Grandmother   . Liver cancer Sister   . Arthritis Brother   . Prostate cancer Brother   . COPD Brother   . Heart disease Brother   . Breast cancer Daughter   . COPD Paternal Grandmother   . Breast cancer Paternal Grandmother     SOCIAL HISTORY Social History   Tobacco Use  . Smoking status: Former Smoker    Packs/day: 0.50    Types: Cigarettes    Start date: 41    Quit date: 1965    Years since quitting: 56.6  . Smokeless tobacco: Never Used  Vaping Use  . Vaping Use: Never used  Substance Use Topics  . Alcohol use: Yes    Alcohol/week: 1.0 standard drink    Types: 1 Glasses of wine per week    Comment: 2-3 times weekly   . Drug use: Never         OPHTHALMIC EXAM:  Base Eye Exam    Visual Acuity (Snellen - Linear)      Right Left   Dist cc 20/30 +2 20/30  +2   Dist ph cc NI NI   Correction: Glasses       Tonometry (Tonopen, 8:14 AM)      Right Left   Pressure 15 15       Pupils      Dark Light Shape React APD   Right 3 2 Round Minimal None   Left 3 2 Round Minimal  None       Visual Fields (Counting fingers)      Left Right    Full Full       Extraocular Movement      Right Left    Full Full       Neuro/Psych    Oriented x3: Yes   Mood/Affect: Normal       Dilation    Both eyes: 1.0% Mydriacyl, 2.5% Phenylephrine @ 8:13 AM        Slit Lamp and Fundus Exam    Slit Lamp Exam      Right Left   Lids/Lashes Dermatochalasis - upper lid, Telangiectasia Dermatochalasis - upper lid, mild Meibomian gland dysfunction   Conjunctiva/Sclera Temporal Pinguecula White and quiet   Cornea Arcus, 2+ Punctate epithelial erosions Arcus, 1-2+ inferior Punctate epithelial erosions   Anterior Chamber Deep and quiet, narrow temporal angle Deep and quiet   Iris Round and dilated Round and dilated   Lens PC IOL in good position with open PC PC IOL in good position with open PC   Vitreous Vitreous syneresis Vitreous syneresis       Fundus Exam      Right Left   Disc 360 Peripapillary atrophy, Sharp rim, mild Pallor Peripapillary atrophy, difficult to assess rim, mild Pallor   C/D Ratio 0.6 0.2   Macula Flat, Blunted foveal reflex, drusen, RPE mottling and clumping, no heme or edema Flat, Blunted foveal reflex, drusen, RPE mottling and clumping, no heme or edema   Vessels Vascular attenuation, Tortuous Vascular attenuation   Periphery Attached, scattered Reticular degeneration, pigmented cystoid degeneration temporally, No heme  Attached, scattered Reticular degeneration, No heme         Refraction    Wearing Rx      Sphere Cylinder Axis Add   Right -0.75 +2.25 175 +2.75   Left -0.25 +1.25 165 +2.75          IMAGING AND PROCEDURES  Imaging and Procedures for @TODAY @  OCT, Retina - OU - Both Eyes       Right Eye Quality  was good. Central Foveal Thickness: 306. Progression has been stable. Findings include normal foveal contour, no IRF, no SRF, retinal drusen , outer retinal atrophy, epiretinal membrane (Partial PVD).   Left Eye Quality was good. Central Foveal Thickness: 295. Progression has been stable. Findings include normal foveal contour, no IRF, no SRF, retinal drusen , outer retinal atrophy, epiretinal membrane (Trace ERM).   Notes *Images captured and stored on drive  Diagnosis / Impression:  Non-Exudative ARMD OU--no change from prior Partial PVD OU  Clinical management:  See below  Abbreviations: NFP - Normal foveal profile. CME - cystoid macular edema. PED - pigment epithelial detachment. IRF - intraretinal fluid. SRF - subretinal fluid. EZ - ellipsoid zone. ERM - epiretinal membrane. ORA - outer retinal atrophy. ORT - outer retinal tubulation. SRHM - subretinal hyper-reflective material                  ASSESSMENT/PLAN:    ICD-10-CM   1. Intermediate stage nonexudative age-related macular degeneration of both eyes  H35.3132   2. Long-term use of Plaquenil  Z79.899   3. Retinal edema  H35.81 OCT, Retina - OU - Both Eyes  4. Pseudophakia of both eyes  Z96.1   5. Primary open angle glaucoma (POAG) of both eyes, mild stage  H40.1131   6. Dry eyes  H04.123     1. Age related macular degeneration, non-exudative, both eyes  -  mild-intermediate stage-no change from previous visit  - The incidence, anatomy, and pathology of dry AMD, risk of progression, and the AREDS and AREDS 2 study including smoking risks discussed with patient.  - Recommend amsler grid monitoring  - f/u 12 months, sooner prn -- DFE/OCT  2. Plaquenil use for RA  - baseline testing done 05.20.20 -- started plaquenil early May 2020 for RA  - HVF 10-2 showed non-specific defects OU -- essentially normal baseline test  - OCT shows stable retinal abnormalities secondary to ARMD -- stable  - currently taking 200mg  /  day per Dr. Audelia Hives instructions --> 3.29 mg/kg body wt per day  - tapering po prednisone, currently on 2.5mg  every 3 days  - and is also now on 10mg  po MTX weekly + folic acid  - the American Academy of Ophthalmology recommends dosing 5mg /kg per day to reduce risk of retinal toxicity  - recommend consideration of using alternate medication for RA especially given co-morbidity of age-related macular degeneration OU  - managed by [facility] Dossie Der at [facility]   3. No retinal edema on exam or OCT  4. Pseudophakia OU  - s/p CE/IOL w/ ?canaloplasty OU by Dr. Robyne Askew at [facility]  - doing well  - monitor  5. History of Glaucoma-   - not currently on gtts -- IOP 15 OU  - ?underwent canaloplasty in conjunction with CEIOL at [facility] clinic  - referred to and now under the expert care of Dr. Kathlen Mody -- initial visit/consult 10.3.2019  6. Dry Eye Syndrome  -use AT's tid OU  Ophthalmic Meds Ordered this visit:  No orders of the defined types were placed in this encounter.      Return in about 1 year (around 10/10/2020) for f/u non-exu ARMD OU, DFE, OCT.  There are no Patient Instructions on file for this visit.   Explained the diagnoses, plan, and follow up with the patient and they expressed understanding.  Patient expressed understanding of the importance of proper follow up care.    This document serves as a record of services personally performed by Gardiner Sleeper, MD, PhD. It was created on their behalf by Leonie Douglas, an ophthalmic technician. The creation of this record is the provider's dictation and/or activities during the visit.    Electronically signed by: Leonie Douglas COA, 10/11/19  10:53 PM   This document serves as a record of services personally performed by Gardiner Sleeper, MD, PhD. It was created on their behalf by San Jetty. Owens Shark, OA an ophthalmic technician. The creation of this record is the provider's dictation and/or activities during the  visit.    Electronically signed by: San Jetty. Marguerita Merles 08.19.2021 10:53 PM  Gardiner Sleeper, M.D., Ph.D. Diseases & Surgery of the Retina and Vitreous [facility]  I have reviewed the above documentation for accuracy and completeness, and I agree with the above. Gardiner Sleeper, M.D., Ph.D. 10/11/19 10:58 PM   Abbreviations: M myopia (nearsighted); A astigmatism; H hyperopia (farsighted); P presbyopia; Mrx spectacle prescription;  CTL contact lenses; OD right eye; OS left eye; OU both eyes  XT exotropia; ET esotropia; PEK punctate epithelial keratitis; PEE punctate epithelial erosions; DES dry eye syndrome; MGD meibomian gland dysfunction; ATs artificial tears; PFAT's preservative free artificial tears; [place] nuclear sclerotic cataract; PSC posterior subcapsular cataract; ERM epi-retinal membrane; PVD posterior vitreous detachment; RD retinal detachment; DM diabetes mellitus; DR diabetic retinopathy; NPDR non-proliferative diabetic retinopathy; PDR proliferative diabetic retinopathy; CSME  clinically significant macular edema; DME diabetic macular edema; dbh dot blot hemorrhages; CWS cotton wool spot; POAG primary open angle glaucoma; C/D cup-to-disc ratio; HVF humphrey visual field; GVF goldmann visual field; OCT optical coherence tomography; IOP intraocular pressure; BRVO Branch retinal vein occlusion; CRVO central retinal vein occlusion; CRAO central retinal artery occlusion; BRAO branch retinal artery occlusion; RT retinal tear; SB scleral buckle; PPV pars plana vitrectomy; VH Vitreous hemorrhage; PRP panretinal laser photocoagulation; IVK intravitreal kenalog; VMT vitreomacular traction; MH Macular hole;  NVD neovascularization of the disc; NVE neovascularization elsewhere; AREDS age related eye disease study; ARMD age related macular degeneration; POAG primary open angle glaucoma; EBMD epithelial/anterior basement membrane dystrophy; ACIOL anterior chamber intraocular lens; IOL  intraocular lens; PCIOL posterior chamber intraocular lens; Phaco/IOL phacoemulsification with intraocular lens placement; [place] photorefractive keratectomy; LASIK laser assisted in situ keratomileusis; HTN hypertension; DM diabetes mellitus; COPD chronic obstructive pulmonary disease

## 2019-10-11 ENCOUNTER — Encounter (INDEPENDENT_AMBULATORY_CARE_PROVIDER_SITE_OTHER): Payer: Self-pay | Admitting: Ophthalmology

## 2019-10-11 ENCOUNTER — Other Ambulatory Visit: Payer: Self-pay

## 2019-10-11 ENCOUNTER — Ambulatory Visit (INDEPENDENT_AMBULATORY_CARE_PROVIDER_SITE_OTHER): Payer: Medicare HMO | Admitting: Ophthalmology

## 2019-10-11 DIAGNOSIS — H04123 Dry eye syndrome of bilateral lacrimal glands: Secondary | ICD-10-CM

## 2019-10-11 DIAGNOSIS — H401131 Primary open-angle glaucoma, bilateral, mild stage: Secondary | ICD-10-CM

## 2019-10-11 DIAGNOSIS — H353132 Nonexudative age-related macular degeneration, bilateral, intermediate dry stage: Secondary | ICD-10-CM

## 2019-10-11 DIAGNOSIS — H3581 Retinal edema: Secondary | ICD-10-CM | POA: Diagnosis not present

## 2019-10-11 DIAGNOSIS — Z961 Presence of intraocular lens: Secondary | ICD-10-CM | POA: Diagnosis not present

## 2019-10-11 DIAGNOSIS — Z79899 Other long term (current) drug therapy: Secondary | ICD-10-CM | POA: Diagnosis not present

## 2020-02-05 ENCOUNTER — Telehealth: Payer: Self-pay | Admitting: Family Medicine

## 2020-02-05 DIAGNOSIS — M2550 Pain in unspecified joint: Secondary | ICD-10-CM

## 2020-02-05 DIAGNOSIS — G4762 Sleep related leg cramps: Secondary | ICD-10-CM

## 2020-02-05 MED ORDER — MONTELUKAST SODIUM 10 MG PO TABS: 10.0000 mg | ORAL_TABLET | Freq: Every day | ORAL | 0 refills | Status: DC

## 2020-02-05 MED ORDER — PRAMIPEXOLE DIHYDROCHLORIDE 0.25 MG PO TABS: ORAL_TABLET | ORAL | 0 refills | Status: DC

## 2020-02-05 NOTE — Telephone Encounter (Signed)
Rx sent. MUST HAVE OV FOR FURTHER REFILLS

## 2020-02-05 NOTE — Telephone Encounter (Signed)
Patient requests refills of montelukast and pramipexole. Please send to new pharmacy: [facility]: phone [phone].

## 2020-02-18 ENCOUNTER — Other Ambulatory Visit: Payer: Self-pay | Admitting: Family Medicine

## 2020-02-25 ENCOUNTER — Telehealth: Payer: Self-pay

## 2020-02-25 ENCOUNTER — Telehealth (INDEPENDENT_AMBULATORY_CARE_PROVIDER_SITE_OTHER): Payer: Medicare HMO | Admitting: Family Medicine

## 2020-02-25 DIAGNOSIS — J4520 Mild intermittent asthma, uncomplicated: Secondary | ICD-10-CM | POA: Diagnosis not present

## 2020-02-25 DIAGNOSIS — R059 Cough, unspecified: Secondary | ICD-10-CM

## 2020-02-25 MED ORDER — BENZONATATE 100 MG PO CAPS: 100.0000 mg | ORAL_CAPSULE | Freq: Three times a day (TID) | ORAL | 0 refills | Status: DC | PRN

## 2020-02-25 MED ORDER — ALBUTEROL SULFATE HFA 108 (90 BASE) MCG/ACT IN AERS: 2.0000 | INHALATION_SPRAY | Freq: Four times a day (QID) | RESPIRATORY_TRACT | 1 refills | Status: DC | PRN

## 2020-02-25 NOTE — Progress Notes (Signed)
Virtual Visit via Video Note  I connected with Margaret [name]  on 02/25/20 at  1:20 PM EST by a video enabled telemedicine application and verified that I am speaking with the correct person using two identifiers.  Location patient: home, [place] Location provider:work or home office Persons participating in the virtual visit: patient, provider, daughters  I discussed the limitations of evaluation and management by telemedicine and the availability of in person appointments. The patient expressed understanding and agreed to proceed.   HPI:  Acute telemedicine visit for : -Onset:3 days ago -Symptoms include: nasal congestion, cough, occ feeling of asthma - ? Mild sob at time - but very mild -reports does not feel bad but wanted to get refill of albuterol in case she needed it -family member with similar symptoms and tested negative for covid 3x -Denies: fever, SOB, CP, wheezing, nvd -Has tried: nothing -Pertinent past medical history: asthma, has not required her albuterol in a long time and needs refill, RA on  Plaquenil,mtx, prednisone, heart disease and more - see below -Pertinent medication allergies: augmentin -COVID-19 vaccine status: covid vaccines x3 and had flu shot  ROS: See pertinent positives and negatives per HPI.  Past Medical History:  Diagnosis Date  . Allergy   . Arthritis   . Arthritis of left knee   . Asthma   . Carotid artery stenosis 10/17/2012   stable report in 2018- "moderate on right, minimal on left"  . Carpal tunnel syndrome 2018   left.  . Cervical radiculopathy 05/16/2015   DDD C4-C5, C5-C6, and C6-C7.  Facet arthropathy throughout cervical spine.  Normal foraminal narrowing secondary to uncovertebral  arthropathy is seen bilaterally at C3-C4 and C5/C6  . Chicken pox   . DDD (degenerative disc disease), lumbar 04/26/2015   Mild levoscoliosis, right anterior listhesis of L4 and L5.  DDD at every level.  Facet arthropathy at multiple levels.  . Deviated septum    . Glaucoma    both eyes, mild opened angle  . Heart murmur   . History of colon polyps   . History of fall   . History of frequent urinary tract infections    klebs- pansensitive. c. freundii - resitant to cefazolin and augementin  . Hypothyroidism   . Iron deficiency anemia    prior pcp told her to take Fe 325 QD  . Macular degeneration    h/o retinal edema  . Mitral valve prolapse 08/27/2011  . Murmur, cardiac 10/17/2012  . Osteoporosis   . 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 Pseudophakia of both eyes   . PVD (peripheral vascular disease) ([facility])   . Rheumatic fever    87 years old   . Syncope 05/17/2017  . Thyroid disease   . Urinary incontinence   . Vertigo    had been prescribed antivert  . Vitamin D deficiency     Past Surgical History:  Procedure Laterality Date  . CARPAL TUNNEL RELEASE Bilateral 2019  . CATARACT EXTRACTION  1997  . colonoscopy   2010  . NASAL SEPTUM SURGERY  2007  . WRIST FRACTURE SURGERY Right 1996     Current Outpatient Medications:  .  benzonatate (TESSALON PERLES) 100 MG capsule, Take 1 capsule (100 mg total) by mouth 3 (three) times daily as needed., Disp: 20 capsule, Rfl: 0 .  albuterol (VENTOLIN HFA) 108 (90 Base) MCG/ACT inhaler, Inhale 2 puffs into the lungs every 6 (six) hours as needed for wheezing or shortness of breath., Disp:  1 each, Rfl: 1 .  fluticasone (FLONASE) 50 MCG/ACT nasal spray, , Disp: , Rfl:  .  folic acid (FOLVITE) 0.5 MG tablet, Take 0.5 mg by mouth daily., Disp: , Rfl:  .  hydroxychloroquine (PLAQUENIL) 200 MG tablet, Take 300 mg by mouth daily., Disp: , Rfl:  .  levothyroxine (SYNTHROID) 50 MCG tablet, Take 1 tablet (50 mcg total) by mouth daily before breakfast., Disp: 90 tablet, Rfl: 3 .  methotrexate (RHEUMATREX) 2.5 MG tablet, Take 10 mg by mouth once a week. 4 tablets once a week, Disp: , Rfl:  .  montelukast (SINGULAIR) 10 MG tablet, Take 1 tablet (10 mg total) by mouth at bedtime., Disp: 30 tablet, Rfl: 0 .   Multiple Vitamins-Minerals (PRESERVISION AREDS 2) CAPS, Take by mouth., Disp: , Rfl:  .  pramipexole (MIRAPEX) 0.25 MG tablet, TK 1 T PO 1 HOUR BEFORE BEDTIME, Disp: 30 tablet, Rfl: 0 .  prednisoLONE acetate (PRED FORTE) 1 % ophthalmic suspension, , Disp: , Rfl:  .  predniSONE (DELTASONE) 5 MG tablet, 5 mg., Disp: , Rfl:   EXAM:  VITALS per patient if applicable:  GENERAL: alert, oriented, appears well and in no acute distress  HEENT: atraumatic, conjunttiva clear, no obvious abnormalities on inspection of external nose and ears  NECK: normal movements of the head and neck  LUNGS: on inspection no signs of respiratory distress, breathing rate appears normal, no obvious gross SOB, gasping or wheezing  CV: no obvious cyanosis  MS: moves all visible extremities without noticeable abnormality  PSYCH/NEURO: pleasant and cooperative, no obvious depression or anxiety, speech and thought processing grossly intact  ASSESSMENT AND PLAN:  Discussed the following assessment and plan:  Cough  Mild intermittent asthma without complication  -we discussed possible serious and likely etiologies, options for evaluation and workup, limitations of telemedicine visit vs in person visit, treatment, treatment risks and precautions. Pt prefers to treat via telemedicine empirically rather than in person at this moment.  Query viral upper respiratory illness versus other.  Given her past medical history, did advise Covid testing and discussed options.  She was reluctant to go out for testing, given her daughter tested -3 times and has similar symptoms.  Discussed potential treatment, potential complications, precautions in case this were Covid.  She opted currently for refill of albuterol for her asthma in case she needs it and Tessalon for cough.  Discussed other symptomatic over-the-counter options as well, summarized in patient instructions.  Agrees to schedule follow-up visit with her PCP office or go to  urgent care for positive Covid test, worsening or any concerns.  They also requested the number for the outpatient treatment center for Covid, included in patient instructions.  Advised to seek prompt in person care if worsening, new symptoms arise, or if is not improving with treatment. Discussed options for inperson care if PCP office not available. Did let this patient know that I only do telemedicine on Tuesdays and Thursdays for [place]. Advised to schedule follow up visit with PCP or [facility] if any further questions or concerns to avoid delays in care.   I discussed the assessment and treatment plan with the patient. The patient was provided an opportunity to ask questions and all were answered. The patient agreed with the plan and demonstrated an understanding of the instructions.     Lucretia Kern, DO

## 2020-02-25 NOTE — Telephone Encounter (Signed)
Patient scheduled for VV with Dr. Colin Benton today.    [facility] - Client TELEPHONE ADVICE RECORD [facility] Patient Name: Margaret [name] Gender: Female DOB: [birth year] Age: 87 Y 66 M 12 D Return Phone Number: [phone] (Primary) Address: [place] [address] Client [facility] - Client Client Site [place] - Day Physician Raoul Pitch, [place] Contact Type Call Who Is Calling Patient / Member / Family / Caregiver Call Type Triage / Clinical Relationship To Patient Self Return Phone Number [phone] (Primary) Chief Complaint Cough Reason for Call Symptomatic / Request for [name] states she is experiencing a cough with a runny nose sore throat and congestion. Translation No Nurse Assessment Nurse: Kathi Ludwig, RN, Leana Roe Date/Time (Eastern Time): 02/25/2020 8:17:18 AM Confirm and document reason for call. If symptomatic, describe symptoms. ---Caller states cough, runny nose, sore throat and congestion. started Sat. no fever Does the patient have any new or worsening symptoms? ---Yes Will a triage be completed? ---Yes Related visit to physician within the last 2 weeks? ---No Does the PT have any chronic conditions? (i.e. diabetes, asthma, this includes High risk factors for pregnancy, etc.) ---Yes List chronic conditions. ---asthma Is this a behavioral health or substance abuse call? ---No Guidelines Guideline Title Affirmed Question Affirmed Notes Nurse Date/Time (Eastern Time) COVID-19 - Diagnosed or Suspected HIGH RISK for severe COVID complications (e.g., age > 77 years, obesity with BMI > 81, pregnant, chronic lung disease or other chronic medical condition) (Exception: Already seen by PCP and no new or worsening symptoms.) Kathi Ludwig, RN, Tracie 02/25/2020 8:18:26 AM Disp. Time Eilene Ghazi Time) Disposition Final User 02/25/2020 8:24:48 AM Call PCP Now  Yes Kathi Ludwig, RN, Leana Roe PLEASE NOTE: All timestamps contained within this report are represented as [place] Standard Time. CONFIDENTIALTY NOTICE: This fax transmission is intended only for the addressee. It contains information that is legally privileged, confidential or otherwise protected from use or disclosure. If you are not the intended recipient, you are strictly prohibited from reviewing, disclosing, copying using or disseminating any of this information or taking any action in reliance on or regarding this information. If you have received this fax in error, please notify us immediately by telephone so that we can arrange for its return to [place]. Phone: [phone], Toll-Free: [phone], Fax: [phone] Page: 2 of 2 Call Id: [id number] [place] Disagree/Comply Comply Caller Understands Yes PreDisposition Call Doctor Care Advice Given Per Guideline CALL PCP NOW: * You need to discuss this with your doctor (or NP/PA). HOW TO PROTECT OTHERS - WHEN YOU ARE SICK WITH COVID-19: * STAY HOME A MINIMUM OF 10 DAYS: Home isolation is needed for at least 10 days after the symptoms started. Stay home from school or work if you are sick. Do NOT go to religious services, child care centers, shopping, or other public places. Do NOT use public transportation (e.g., bus, taxis, ride-sharing). Do NOT allow any visitors to your home. Leave the house only if you need to seek urgent medical care. GENERAL CARE ADVICE FOR COVID-19 SYMPTOMS: * The treatment is the same whether you have COVID-19, influenza or some other respiratory virus. * Cough: Use cough drops. * Fever: For fever over 101 F (38.3 C), take acetaminophen every 4 to 6 hours (Adults 650 mg) OR ibuprofen every 6 to 8 hours (Adults 400 mg). Before taking any medicine, read all the instructions on the package. Do not take aspirin unless your doctor has prescribed it for  you. * Feeling dehydrated: Drink extra liquids. If the air in your home is dry,  use a humidifier. * Muscle aches, headache, and other pains: Often this comes and goes with the fever. Take acetaminophen every 4 to 6 hours (Adults 650 mg) OR ibuprofen every 6 to 8 hours (Adults 400 mg). Before taking any medicine, read all the instructions on the package. * Sore throat: Try throat lozenges, hard candy or warm chicken broth. * COUGH SYRUP WITH DEXTROMETHORPHAN: An over-the-counter cough syrup can help your cough. The most common cough suppressant in over-the-counter cough medicines is dextromethorphan. COUGH MEDICINES: * HOME REMEDY - HONEY: This old home remedy has been shown to help decrease coughing at night. The adult dosage is 2 teaspoons (10 ml) at bedtime. Honey should not be given to infants under one year of age. CALL BACK IF: * You become worse CARE ADVICE given per COVID-19 - DIAGNOSED OR SUSPECTED (Adult) guideline. Comments User: Theresa Mulligan, RN Date/Time [name] Time): 02/25/2020 8:27:56 AM spoke with backline. no appt avail at this office. will check other schedules and call patient back. [name] verb understanding

## 2020-02-25 NOTE — Patient Instructions (Addendum)
  HOME CARE TIPS:  -[place] COVID19 testing information: [email or web address] [place] [phone] Most pharmacies also offer testing and home test kits.  -I sent the medication(s) we discussed to your pharmacy: Meds ordered this encounter  Medications  . benzonatate (TESSALON PERLES) 100 MG capsule    Sig: Take 1 capsule (100 mg total) by mouth 3 (three) times daily as needed.    Dispense:  20 capsule    Refill:  0  . albuterol (VENTOLIN HFA) 108 (90 Base) MCG/ACT inhaler    Sig: Inhale 2 puffs into the lungs every 6 (six) hours as needed for wheezing or shortness of breath.    Dispense:  1 each    Refill:  1     -COVID19 outpatient treatment center: [phone] (only call if your Covid test is positive and you are interested in monoclonal antibody treatment which is available to those with risk factors within 10 days of symptom onset)  -can use nasal saline a few times per day if nasal congestion  -stay hydrated, drink plenty of fluids and eat small healthy meals - avoid dairy  -If the Covid test is positive, schedule follow-up visit and check out the [facility] website for more information on home care, transmission and treatment for COVID19  -follow up with your doctor in 2-3 days unless improving and feeling better  -stay home while sick, except to seek medical care, and if you have COVID19 please stay home for a full 10 days since the onset of symptoms PLUS one day of no fever and feeling better.  It was nice to meet you today, and I really hope you are feeling better soon. I help [facility] out with telemedicine visits on Tuesdays and Thursdays and am available for visits on those days. If you have any concerns or questions following this visit please schedule a follow up visit with your Primary Care doctor or seek care at a local urgent care clinic to avoid delays in care.    Seek in person care promptly if your symptoms worsen, new concerns  arise or you are not improving with treatment. Call 911 and/or seek emergency care if you symptoms are severe or life threatening.

## 2020-02-26 ENCOUNTER — Encounter: Payer: Self-pay | Admitting: Family Medicine

## 2020-02-27 ENCOUNTER — Ambulatory Visit (HOSPITAL_BASED_OUTPATIENT_CLINIC_OR_DEPARTMENT_OTHER)
Admission: RE | Admit: 2020-02-27 | Discharge: 2020-02-27 | Disposition: A | Discharge status: Home / Self Care | Payer: Medicare HMO | Source: Ambulatory Visit | Attending: Family Medicine | Admitting: Family Medicine

## 2020-02-27 ENCOUNTER — Encounter: Payer: Self-pay | Admitting: Family Medicine

## 2020-02-27 ENCOUNTER — Ambulatory Visit (INDEPENDENT_AMBULATORY_CARE_PROVIDER_SITE_OTHER): Payer: Medicare HMO | Admitting: Family Medicine

## 2020-02-27 ENCOUNTER — Other Ambulatory Visit: Payer: Self-pay

## 2020-02-27 ENCOUNTER — Other Ambulatory Visit: Payer: Medicare HMO

## 2020-02-27 VITALS — BP 127/70 | HR 80 | Temp 99.0°F | Ht 60.0 in | Wt 126.0 lb

## 2020-02-27 DIAGNOSIS — M359 Systemic involvement of connective tissue, unspecified: Secondary | ICD-10-CM | POA: Insufficient documentation

## 2020-02-27 DIAGNOSIS — M810 Age-related osteoporosis without current pathological fracture: Secondary | ICD-10-CM | POA: Insufficient documentation

## 2020-02-27 DIAGNOSIS — J189 Pneumonia, unspecified organism: Secondary | ICD-10-CM

## 2020-02-27 DIAGNOSIS — M1990 Unspecified osteoarthritis, unspecified site: Secondary | ICD-10-CM | POA: Insufficient documentation

## 2020-02-27 MED ORDER — BENZONATATE 100 MG PO CAPS: 100.0000 mg | ORAL_CAPSULE | Freq: Three times a day (TID) | ORAL | 0 refills | Status: DC | PRN

## 2020-02-27 MED ORDER — LEVOFLOXACIN 750 MG PO TABS: 750.0000 mg | ORAL_TABLET | Freq: Every day | ORAL | 0 refills | Status: DC

## 2020-02-27 NOTE — Progress Notes (Signed)
This visit occurred during the SARS-CoV-2 public health emergency.  Safety protocols were in place, including screening questions prior to the visit, additional usage of staff PPE, and extensive cleaning of exam room while observing appropriate contact time as indicated for disinfecting solutions.    Margaret [name] , 1929/05/25, 87 y.o., female MRN: [id number] Patient Care Team    Relationship Specialty Notifications Start End  Ma Hillock, DO PCP - General Family Medicine  03/08/19   Bernarda Caffey, MD Consulting Physician Ophthalmology  03/29/18   Konrad Felix, MD Consulting Physician Ophthalmology  03/29/18   Devra Dopp, MD Referring Physician Dermatology  08/02/19   Valinda Party, MD  Rheumatology  08/02/19     Chief Complaint  Patient presents with  . Cough    Pt c/o cough, body aches, chills x 1 week;      Subjective: Pt presents for an OV with complaints of cough, body aches and chills of little over 1 week duration.  Was seen by telehealth on 02/25/2020 due to symptoms of nasal congestion and cough that have been present since 02/22/2020.  Patient reports her symptoms have progressed to more head congestion, productive cough, sore throat, wheezing and chills.  She denies fever or GI symptoms.  She is tolerating p.o.  She reports she has not needed her albuterol inhaler but feels "rattling" in her lungs.  She has been taking Tessalon Perles to help with her cough.  She has a family member that has been sick and has tested negative for Covid x3.  [name] has had a negative Covid test 02/26/2020.  She has been vaccinated against Covid.  Depression screen [facility] 2/9 03/29/2018  Decreased Interest 0  Down, Depressed, Hopeless 0  PHQ - 2 Score 0    Allergies  Allergen Reactions  . Augmentin [Amoxicillin-Pot Clavulanate] Swelling and Rash    Swelling in face   Social History   Social History Narrative   Marital status/children/pets: Widowed.  Moved to [place]  from [place] 2019.  She shares a home with 1 of her daughters.   Education/employment: Buyer, retail of arts degree, retired Marine scientist:      -smoke alarm in the home:Yes     - wears seatbelt: Yes     - Feels safe in their relationships: Yes   Past Medical History:  Diagnosis Date  . Allergy   . Arthritis   . Arthritis of left knee   . Asthma   . Carotid artery stenosis 10/17/2012   stable report in 2018- "moderate on right, minimal on left"  . Carpal tunnel syndrome 2018   left.  . Cervical radiculopathy 05/16/2015   DDD C4-C5, C5-C6, and C6-C7.  Facet arthropathy throughout cervical spine.  Normal foraminal narrowing secondary to uncovertebral  arthropathy is seen bilaterally at C3-C4 and C5/C6  . Chicken pox   . DDD (degenerative disc disease), lumbar 04/26/2015   Mild levoscoliosis, right anterior listhesis of L4 and L5.  DDD at every level.  Facet arthropathy at multiple levels.  . Deviated septum   . Glaucoma    both eyes, mild opened angle  . Heart murmur   . History of colon polyps   . History of fall   . History of frequent urinary tract infections    klebs- pansensitive. c. freundii - resitant to cefazolin and augementin  . Hypothyroidism   . Iron deficiency anemia    prior pcp told her to take Fe 325 QD  .  Macular degeneration    h/o retinal edema  . Mitral valve prolapse 08/27/2011  . Murmur, cardiac 10/17/2012  . Osteoporosis   . 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 Pseudophakia of both eyes   . PVD (peripheral vascular disease) ([place])   . Rheumatic fever    87 years old   . Syncope 05/17/2017  . Thyroid disease   . Urinary incontinence   . Vertigo    had been prescribed antivert  . Vitamin D deficiency    Past Surgical History:  Procedure Laterality Date  . CARPAL TUNNEL RELEASE Bilateral 2019  . CATARACT EXTRACTION  1997  . colonoscopy   2010  . NASAL SEPTUM SURGERY  2007  . WRIST FRACTURE SURGERY Right 1996   Family History  Problem Relation Age  of Onset  . Arthritis Mother   . COPD Mother   . Prostate cancer Father   . Arthritis Father   . Hearing loss Father   . Heart disease Father   . Heart attack Father   . Blindness Maternal Grandmother   . Stroke Maternal Grandmother   . Liver cancer Sister   . Arthritis Brother   . Prostate cancer Brother   . COPD Brother   . Heart disease Brother   . Breast cancer Daughter   . COPD Paternal Grandmother   . Breast cancer Paternal Grandmother    Allergies as of 02/27/2020      Reactions   Augmentin [amoxicillin-pot Clavulanate] Swelling, Rash   Swelling in face      Medication List       Accurate as of February 27, 2020  6:54 PM. If you have any questions, ask your nurse or doctor.        albuterol 108 (90 Base) MCG/ACT inhaler Commonly known as: VENTOLIN HFA Inhale 2 puffs into the lungs every 6 (six) hours as needed for wheezing or shortness of breath.   benzonatate 100 MG capsule Commonly known as: TESSALON Take 1 capsule (100 mg total) by mouth 3 (three) times daily as needed for cough. What changed: reasons to take this Changed by: Howard Pouch, DO   fluticasone 50 MCG/ACT nasal spray Commonly known as: FLONASE   folic acid 0.5 MG tablet Commonly known as: FOLVITE Take 0.5 mg by mouth daily.   hydroxychloroquine 200 MG tablet Commonly known as: PLAQUENIL Take 300 mg by mouth daily.   levofloxacin 750 MG tablet Commonly known as: Levaquin Take 1 tablet (750 mg total) by mouth daily. Started by: Howard Pouch, DO   levothyroxine 50 MCG tablet Commonly known as: SYNTHROID Take 1 tablet (50 mcg total) by mouth daily before breakfast.   methotrexate 2.5 MG tablet Commonly known as: RHEUMATREX Take 10 mg by mouth once a week. 4 tablets once a week   montelukast 10 MG tablet Commonly known as: SINGULAIR Take 1 tablet (10 mg total) by mouth at bedtime.   pramipexole 0.25 MG tablet Commonly known as: MIRAPEX TK 1 T PO 1 HOUR BEFORE BEDTIME    prednisoLONE acetate 1 % ophthalmic suspension Commonly known as: PRED FORTE   predniSONE 5 MG tablet Commonly known as: DELTASONE 5 mg.   PreserVision AREDS 2 Caps Take by mouth.   TYLENOL 500 MG tablet Generic drug: acetaminophen 1 tablet as needed   Vitamin D (Ergocalciferol) 1.25 MG (50000 UNIT) Caps capsule Commonly known as: DRISDOL 1 capsule       All past medical history, surgical history, allergies, family history,  immunizations andmedications were updated in the EMR today and reviewed under the history and medication portions of their EMR.     ROS: Negative, with the exception of above mentioned in HPI   Objective:  BP 127/70   Pulse 80   Temp 99 F (37.2 C) (Oral)   Ht 5' (1.524 m)   Wt 126 lb (57.2 kg)   SpO2 100%   BMI 24.61 kg/m  Body mass index is 24.61 kg/m. Gen: Afebrile. No acute distress. Nontoxic in appearance, well developed, well nourished.  Appears fatigued HENT: AT. [place]. Bilateral TM visualized without erythema or bulging. MMM, no oral lesions. Bilateral nares with erythema and drainage, no swelling. Throat without erythema or exudates.  Cough present.  Hoarseness present. Eyes:Pupils Equal Round Reactive to light, Extraocular movements intact,  Conjunctiva without redness, discharge or icterus. Neck/lymp/endocrine: Supple, no lymphadenopathy CV: RRR  Chest: Left lower lobe with mild rhonchi/rales.  No wheezing appreciated.  Good air movement, normal resp effort.  Coughing with deep inspiration Skin: No rashes, purpura or petechiae.  Neuro:  Normal gait. PERLA. EOMi. Alert. Oriented x3  Psych: Normal affect, dress and demeanor. Normal speech. Normal thought content and judgment.  No exam data present No results found. No results found for this or any previous visit (from the past 24 hour(s)).  Assessment/Plan: Para Cossey is a 87 y.o. female present for OV for  Pneumonia due to infectious organism, unspecified laterality, unspecified  part of lung Suspect early pneumonia.  Patient is at risk for hospitalization and complication due to her chronic medical conditions and immunocompromised state. Elected to treat with Levaquin 1 tab x7 days. Rest, hydrate, Mucinex DM. Tessalon Perles prescribed. Chest x-ray will be obtained and patient will be called with results. - DG Chest 2 View; Future - levofloxacin (LEVAQUIN) 750 MG tablet; Take 1 tablet (750 mg total) by mouth daily.  Dispense: 7 tablet; Refill: 0 Close follow-up will be arranged if chest x-ray results with pneumonia. Patient understands if her symptoms are worsening despite antibiotic treatment she needs to be seen immediately.   Reviewed expectations re: course of current medical issues.  Discussed self-management of symptoms.  Outlined signs and symptoms indicating need for more acute intervention.  Patient verbalized understanding and all questions were answered.  Patient received an After-Visit Summary.    Orders Placed This Encounter  Procedures  . DG Chest 2 View   Meds ordered this encounter  Medications  . levofloxacin (LEVAQUIN) 750 MG tablet    Sig: Take 1 tablet (750 mg total) by mouth daily.    Dispense:  7 tablet    Refill:  0  . benzonatate (TESSALON) 100 MG capsule    Sig: Take 1 capsule (100 mg total) by mouth 3 (three) times daily as needed for cough.    Dispense:  21 capsule    Refill:  0   Referral Orders  No referral(s) requested today     Note is dictated utilizing voice recognition software. Although note has been proof read prior to signing, occasional typographical errors still can be missed. If any questions arise, please do not hesitate to call for verification.   electronically signed by:  Felix Pacini, DO  [facility] - [place]

## 2020-02-27 NOTE — Patient Instructions (Addendum)
[address] it will be a right hand turn off of [address] from here.. For xray- check in the front desk and they will take you to xray.    Levaquin prescribed- once daily for 5-7 days. Length dependent on xray results.   Rest and HYDRATE. mucinex DM can be helpful.   Continue tessalon perles.     Community-Acquired Pneumonia, Adult Pneumonia is an infection of the lungs. It causes swelling in the airways of the lungs. Mucus and fluid may also build up inside the airways. One type of pneumonia can happen while a person is in a hospital. A different type can happen when a person is not in a hospital (community-acquired pneumonia).  What are the causes?  This condition is caused by germs (viruses, bacteria, or fungi). Some types of germs can be passed from one person to another. This can happen when you breathe in droplets from the cough or sneeze of an infected person. What increases the risk? You are more likely to develop this condition if you:  Have a long-term (chronic) disease, such as: ? Chronic obstructive pulmonary disease (COPD). ? Asthma. ? Cystic fibrosis. ? Congestive heart failure. ? Diabetes. ? Kidney disease.  Have HIV.  Have sickle cell disease.  Have had your spleen removed.  Do not take good care of your teeth and mouth (poor dental hygiene).  Have a medical condition that increases the risk of breathing in droplets from your own mouth and nose.  Have a weakened body defense system (immune system).  Are a smoker.  Travel to areas where the germs that cause this illness are common.  Are around certain animals or the places they live. What are the signs or symptoms?  A dry cough.  A wet (productive) cough.  Fever.  Sweating.  Chest pain. This often happens when breathing deeply or coughing.  Fast breathing or trouble breathing.  Shortness of breath.  Shaking chills.  Feeling tired (fatigue).  Muscle aches. How is this  treated? Treatment for this condition depends on many things. Most adults can be treated at home. In some cases, treatment must happen in a hospital. Treatment may include:  Medicines given by mouth or through an IV tube.  Being given extra oxygen.  Respiratory therapy. In rare cases, treatment for very bad pneumonia may include:  Using a machine to help you breathe.  Having a procedure to remove fluid from around your lungs. Follow these instructions at home: Medicines  Take over-the-counter and prescription medicines only as told by your doctor. ? Only take cough medicine if you are losing sleep.  If you were prescribed an antibiotic medicine, take it as told by your doctor. Do not stop taking the antibiotic even if you start to feel better. General instructions   Sleep with your head and neck raised (elevated). You can do this by sleeping in a recliner or by putting a few pillows under your head.  Rest as needed. Get at least 8 hours of sleep each night.  Drink enough water to keep your pee (urine) pale yellow.  Eat a healthy diet that includes plenty of vegetables, fruits, whole grains, low-fat dairy products, and lean protein.  Do not use any products that contain nicotine or tobacco. These include cigarettes, e-cigarettes, and chewing tobacco. If you need help quitting, ask your doctor.  Keep all follow-up visits as told by your doctor. This is important. How is this prevented? A shot (vaccine) can help prevent pneumonia. Shots  are often suggested for:  People older than 85 years of age.  People older than 85 years of age who: ? Are having cancer treatment. ? Have long-term (chronic) lung disease. ? Have problems with their body's defense system. You may also prevent pneumonia if you take these actions:  Get the flu (influenza) shot every year.  Go to the dentist as often as told.  Wash your hands often. If you cannot use soap and water, use hand  sanitizer. Contact a doctor if:  You have a fever.  You lose sleep because your cough medicine does not help. Get help right away if:  You are short of breath and it gets worse.  You have more chest pain.  Your sickness gets worse. This is very serious if: ? You are an older adult. ? Your body's defense system is weak.  You cough up blood. Summary  Pneumonia is an infection of the lungs.  Most adults can be treated at home. Some will need treatment in a hospital.  Drink enough water to keep your pee pale yellow.  Get at least 8 hours of sleep each night. This information is not intended to replace advice given to you by your health care provider. Make sure you discuss any questions you have with your health care provider. Document Revised: 05/31/2018 Document Reviewed: 10/06/2017 Elsevier Patient Education  [place].

## 2020-02-27 NOTE — Telephone Encounter (Signed)
Sorry to hear Ms. Bunten is not feeling well.  Grateful that she does not seem to be Covid positive.   Please schedule patient for a visit if desiring further intervention.  Patient has been seen by telehealth Doc for prior visit concerning this illness.   Since she has had a negative Covid test 02/26/2020 her appointment can be in person or virtually.  Appt can be VIDEO if patient desires,  only if they have  mychart, computer or smart phone that is capable of virtual VIDEO visit.   Instructions- They must make  sure at least the DAY BEFORE appt to: 1. Sign up for mychart if not done so already.  2. Install all updates to phone if planning on using smart phone.  3. Turn off any security features that block camera & microphone on phone and computer> Access to microphone and camera is a must.  4. Make sure sound is turned up on device being used.    Thanks

## 2020-02-29 ENCOUNTER — Encounter: Payer: Self-pay | Admitting: Family Medicine

## 2020-03-03 MED ORDER — MONTELUKAST SODIUM 10 MG PO TABS: 10.0000 mg | ORAL_TABLET | Freq: Every day | ORAL | 3 refills | Status: DC

## 2020-03-03 NOTE — Telephone Encounter (Signed)
I had already called in a refill of her tessalon perles at her appt.  They will need to cal lup to pharmacy- it should be a new script up there for her already in my name.

## 2020-03-07 ENCOUNTER — Telehealth: Payer: Self-pay | Admitting: Family Medicine

## 2020-03-07 NOTE — Telephone Encounter (Signed)
Spoke with pt and inform pt of providers recommendations.

## 2020-03-07 NOTE — Telephone Encounter (Signed)
Pt states she had pnuemonia last week and on yesterday she got stomach cramps and she is uncertain if its due to the medication she is taking. Pt wants to know if there is something else she can take for the cramps. Please advise

## 2020-03-07 NOTE — Telephone Encounter (Signed)
Please advise. Pt has f/u on the 26th

## 2020-03-07 NOTE — Telephone Encounter (Signed)
Stomach cramps not likely related to meds prescribed last week without any other symptoms like loose stool etc.  She could try to adding a probiotic to her daily regimen- they are OTC.

## 2020-03-10 ENCOUNTER — Encounter (HOSPITAL_BASED_OUTPATIENT_CLINIC_OR_DEPARTMENT_OTHER): Payer: Self-pay

## 2020-03-10 ENCOUNTER — Telehealth: Payer: Self-pay

## 2020-03-10 ENCOUNTER — Emergency Department (HOSPITAL_BASED_OUTPATIENT_CLINIC_OR_DEPARTMENT_OTHER): Payer: Medicare HMO

## 2020-03-10 ENCOUNTER — Inpatient Hospital Stay (HOSPITAL_BASED_OUTPATIENT_CLINIC_OR_DEPARTMENT_OTHER)
Admission: EM | Admit: 2020-03-10 | Discharge: 2020-03-15 | DRG: 372 | Disposition: A | Discharge status: Home Health | Payer: Medicare HMO | Attending: Internal Medicine | Admitting: Internal Medicine

## 2020-03-10 ENCOUNTER — Encounter: Payer: Self-pay | Admitting: Family Medicine

## 2020-03-10 ENCOUNTER — Other Ambulatory Visit: Payer: Self-pay

## 2020-03-10 DIAGNOSIS — J45909 Unspecified asthma, uncomplicated: Secondary | ICD-10-CM | POA: Diagnosis present

## 2020-03-10 DIAGNOSIS — K651 Peritoneal abscess: Secondary | ICD-10-CM | POA: Diagnosis not present

## 2020-03-10 DIAGNOSIS — E039 Hypothyroidism, unspecified: Secondary | ICD-10-CM | POA: Diagnosis present

## 2020-03-10 DIAGNOSIS — R188 Other ascites: Secondary | ICD-10-CM

## 2020-03-10 DIAGNOSIS — Z79890 Hormone replacement therapy (postmenopausal): Secondary | ICD-10-CM

## 2020-03-10 DIAGNOSIS — Z87891 Personal history of nicotine dependence: Secondary | ICD-10-CM

## 2020-03-10 DIAGNOSIS — Z20822 Contact with and (suspected) exposure to covid-19: Secondary | ICD-10-CM | POA: Diagnosis present

## 2020-03-10 DIAGNOSIS — E861 Hypovolemia: Secondary | ICD-10-CM | POA: Diagnosis present

## 2020-03-10 DIAGNOSIS — Z79899 Other long term (current) drug therapy: Secondary | ICD-10-CM

## 2020-03-10 DIAGNOSIS — M064 Inflammatory polyarthropathy: Secondary | ICD-10-CM | POA: Diagnosis present

## 2020-03-10 DIAGNOSIS — E871 Hypo-osmolality and hyponatremia: Secondary | ICD-10-CM | POA: Diagnosis present

## 2020-03-10 DIAGNOSIS — M0600 Rheumatoid arthritis without rheumatoid factor, unspecified site: Secondary | ICD-10-CM | POA: Diagnosis present

## 2020-03-10 DIAGNOSIS — I739 Peripheral vascular disease, unspecified: Secondary | ICD-10-CM | POA: Diagnosis present

## 2020-03-10 DIAGNOSIS — Z7952 Long term (current) use of systemic steroids: Secondary | ICD-10-CM

## 2020-03-10 HISTORY — DX: Peritoneal abscess: K65.1

## 2020-03-10 LAB — CBC WITH DIFFERENTIAL/PLATELET
Abs Immature Granulocytes: 0.07 10*3/uL (ref 0.00–0.07)
Basophils Absolute: 0 10*3/uL (ref 0.0–0.1)
Basophils Relative: 0 %
Eosinophils Absolute: 0.1 10*3/uL (ref 0.0–0.5)
Eosinophils Relative: 1 %
HCT: 33.6 % — ABNORMAL LOW (ref 36.0–46.0)
Hemoglobin: 11.7 g/dL — ABNORMAL LOW (ref 12.0–15.0)
Immature Granulocytes: 1 %
Lymphocytes Relative: 6 %
Lymphs Abs: 0.7 10*3/uL (ref 0.7–4.0)
MCH: 31.8 pg (ref 26.0–34.0)
MCHC: 34.8 g/dL (ref 30.0–36.0)
MCV: 91.3 fL (ref 80.0–100.0)
Monocytes Absolute: 1.3 10*3/uL — ABNORMAL HIGH (ref 0.1–1.0)
Monocytes Relative: 11 %
Neutro Abs: 9.5 10*3/uL — ABNORMAL HIGH (ref 1.7–7.7)
Neutrophils Relative %: 81 %
Platelets: 313 10*3/uL (ref 150–400)
RBC: 3.68 MIL/uL — ABNORMAL LOW (ref 3.87–5.11)
RDW: 12.7 % (ref 11.5–15.5)
WBC: 11.7 10*3/uL — ABNORMAL HIGH (ref 4.0–10.5)
nRBC: 0 % (ref 0.0–0.2)

## 2020-03-10 LAB — URINALYSIS, ROUTINE W REFLEX MICROSCOPIC
Bilirubin Urine: NEGATIVE
Glucose, UA: NEGATIVE mg/dL
Hgb urine dipstick: NEGATIVE
Ketones, ur: NEGATIVE mg/dL
Leukocytes,Ua: NEGATIVE
Nitrite: NEGATIVE
Protein, ur: NEGATIVE mg/dL
Specific Gravity, Urine: 1.005 (ref 1.005–1.030)
pH: 6 (ref 5.0–8.0)

## 2020-03-10 LAB — COMPREHENSIVE METABOLIC PANEL
ALT: 32 U/L (ref 0–44)
AST: 40 U/L (ref 15–41)
Albumin: 3.5 g/dL (ref 3.5–5.0)
Alkaline Phosphatase: 124 U/L (ref 38–126)
Anion gap: 10 (ref 5–15)
BUN: 10 mg/dL (ref 8–23)
CO2: 24 mmol/L (ref 22–32)
Calcium: 8.4 mg/dL — ABNORMAL LOW (ref 8.9–10.3)
Chloride: 97 mmol/L — ABNORMAL LOW (ref 98–111)
Creatinine, Ser: 0.63 mg/dL (ref 0.44–1.00)
GFR, Estimated: >60 mL/min (ref >60)
Glucose, Bld: 98 mg/dL (ref 70–99)
Potassium: 3.7 mmol/L (ref 3.5–5.1)
Sodium: 131 mmol/L — ABNORMAL LOW (ref 135–145)
Total Bilirubin: 0.8 mg/dL (ref 0.3–1.2)
Total Protein: 6.3 g/dL — ABNORMAL LOW (ref 6.5–8.1)

## 2020-03-10 LAB — LIPASE, BLOOD: Lipase: 31 U/L (ref 11–51)

## 2020-03-10 MED ORDER — CIPROFLOXACIN IN D5W 400 MG/200ML IV SOLN: 400.0000 mg | Freq: Once | INTRAVENOUS | Status: DC

## 2020-03-10 MED ORDER — SODIUM CHLORIDE 0.9 % IV SOLN
2.0000 g | Freq: Two times a day (BID) | INTRAVENOUS | Status: DC
  Administered 2020-03-10 – 2020-03-12 (×5): 2 g via INTRAVENOUS
  Filled 2020-03-10 (×2): qty 2
  Filled 2020-03-10: qty 0.28
  Filled 2020-03-10 (×3): qty 2

## 2020-03-10 MED ORDER — METRONIDAZOLE IN NACL 5-0.79 MG/ML-% IV SOLN
500.0000 mg | Freq: Three times a day (TID) | INTRAVENOUS | Status: DC
  Administered 2020-03-10 – 2020-03-14 (×11): 500 mg via INTRAVENOUS
  Filled 2020-03-10 (×11): qty 100

## 2020-03-10 MED ORDER — SODIUM CHLORIDE 0.9 % IV SOLN: 2.0000 g | Freq: Two times a day (BID) | INTRAVENOUS | Status: DC

## 2020-03-10 MED ORDER — METRONIDAZOLE IN NACL 5-0.79 MG/ML-% IV SOLN: 500.0000 mg | Freq: Once | INTRAVENOUS | Status: DC

## 2020-03-10 MED ORDER — SODIUM CHLORIDE 0.9 % IV SOLN: 2.0000 g | Freq: Three times a day (TID) | INTRAVENOUS | Status: DC

## 2020-03-10 MED ORDER — IOHEXOL 300 MG/ML  SOLN
100.0000 mL | Freq: Once | INTRAMUSCULAR | Status: AC | PRN
  Administered 2020-03-10: 75 mL via INTRAVENOUS

## 2020-03-10 MED ORDER — SODIUM CHLORIDE 0.9 % IV BOLUS
500.0000 mL | Freq: Once | INTRAVENOUS | Status: AC
  Administered 2020-03-10: 500 mL via INTRAVENOUS

## 2020-03-10 NOTE — ED Provider Notes (Signed)
[facility] EMERGENCY DEPARTMENT Provider Note   CSN: [id number] Arrival date & time: 03/10/20  1408     History Chief Complaint  Patient presents with  . Abdominal Pain    Margaret [name] is a 87 y.o. female.  Patient with no past surgical history, h/o RA on Plaquenil, methotrexate, prednisone (low dose) -- presents the emergency department for right-sided and lower abdominal cramping pain ongoing over the past several days.  On around New Year's, patient developed a cough.  She was treated for pneumonia with levofloxacin and Tessalon (Covid testing negative).  The symptoms have thankfully improved.  Over the past several days she has had progressively worsening right lower and right lateral abdominal pain, worse with movement, described as cramping without radiation.  Patient has had decreased appetite without vomiting.  No fever.  She denies diarrhea or constipation.  She has a history of UTI, however no UTI symptoms at the current time.  She is very weak and her symptoms have not improved, prompting emergency department visit today.        Past Medical History:  Diagnosis Date  . Allergy   . Arthritis   . Arthritis of left knee   . Asthma   . Carotid artery stenosis 10/17/2012   stable report in 2018- "moderate on right, minimal on left"  . Carpal tunnel syndrome 2018   left.  . Cervical radiculopathy 05/16/2015   DDD C4-C5, C5-C6, and C6-C7.  Facet arthropathy throughout cervical spine.  Normal foraminal narrowing secondary to uncovertebral  arthropathy is seen bilaterally at C3-C4 and C5/C6  . Chicken pox   . DDD (degenerative disc disease), lumbar 04/26/2015   Mild levoscoliosis, right anterior listhesis of L4 and L5.  DDD at every level.  Facet arthropathy at multiple levels.  . Deviated septum   . Glaucoma    both eyes, mild opened angle  . Heart murmur   . History of colon polyps   . History of fall   . History of frequent urinary tract infections     klebs- pansensitive. c. freundii - resitant to cefazolin and augementin  . Hypothyroidism   . Iron deficiency anemia    prior pcp told her to take Fe 325 QD  . Macular degeneration    h/o retinal edema  . Mitral valve prolapse 08/27/2011  . Murmur, cardiac 10/17/2012  . Osteoporosis   . 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 Pseudophakia of both eyes   . PVD (peripheral vascular disease) ([place])   . Rheumatic fever    87 years old   . Syncope 05/17/2017  . Thyroid disease   . Urinary incontinence   . Vertigo    had been prescribed antivert  . Vitamin D deficiency     Patient Active Problem List   Diagnosis Date Noted  . Connective tissue disease ([place]) 02/27/2020  . Osteoporosis 02/27/2020  . Inflammatory arthritis 02/27/2020  . Seronegative rheumatoid arthritis ([place]) 08/02/2019  . Polymyalgia rheumatica ([place]) 08/02/2019  . ANA positive 05/08/2018  . Vitamin D deficiency   . PVD (peripheral vascular disease) ([place])   . Iron deficiency anemia   . Seasonal allergies 03/29/2018  . Nocturnal leg cramps 03/29/2018  . Myalgia 03/29/2018  . Hypothyroidism 03/29/2018  . Overweight (BMI 25.0-29.9) 03/29/2018  . Asthma   . Cervical radiculopathy 05/16/2015  . DDD (degenerative disc disease), lumbar 04/26/2015  . Carotid artery stenosis 10/17/2012  . Mitral valve prolapse 08/27/2011    Past Surgical  History:  Procedure Laterality Date  . CARPAL TUNNEL RELEASE Bilateral 2019  . CATARACT EXTRACTION  1997  . colonoscopy   2010  . NASAL SEPTUM SURGERY  2007  . WRIST FRACTURE SURGERY Right 1996     OB History    Gravida  3   Para  3   Term      Preterm      AB      Living  3     SAB      IAB      Ectopic      Multiple      Live Births              Family History  Problem Relation Age of Onset  . Arthritis Mother   . COPD Mother   . Prostate cancer Father   . Arthritis Father   . Hearing loss Father   . Heart disease Father   . Heart attack Father    . Blindness Maternal Grandmother   . Stroke Maternal Grandmother   . Liver cancer Sister   . Arthritis Brother   . Prostate cancer Brother   . COPD Brother   . Heart disease Brother   . Breast cancer Daughter   . COPD Paternal Grandmother   . Breast cancer Paternal Grandmother     Social History   Tobacco Use  . Smoking status: Former Smoker    Packs/day: 0.50    Types: Cigarettes    Start date: 3    Quit date: 1965    Years since quitting: 57.0  . Smokeless tobacco: Never Used  Vaping Use  . Vaping Use: Never used  Substance Use Topics  . Alcohol use: Yes    Alcohol/week: 1.0 standard drink    Types: 1 Glasses of wine per week    Comment: 2-3 times weekly   . Drug use: Never    Home Medications Prior to Admission medications   Medication Sig Start Date End Date Taking? Authorizing Provider  acetaminophen (TYLENOL) 500 MG tablet 1 tablet as needed    [provider]  albuterol (VENTOLIN HFA) 108 (90 Base) MCG/ACT inhaler Inhale 2 puffs into the lungs every 6 (six) hours as needed for wheezing or shortness of breath. 02/25/20   Lucretia Kern, DO  benzonatate (TESSALON) 100 MG capsule Take 1 capsule (100 mg total) by mouth 3 (three) times daily as needed for cough. 02/27/20   Raoul Pitch, [name], DO  fluticasone (FLONASE) 50 MCG/ACT nasal spray  08/08/17   [provider]  folic acid (FOLVITE) 0.5 MG tablet Take 0.5 mg by mouth daily.    [provider]  hydroxychloroquine (PLAQUENIL) 200 MG tablet Take 300 mg by mouth daily. 06/04/19   [provider]  levofloxacin (LEVAQUIN) 750 MG tablet Take 1 tablet (750 mg total) by mouth daily. 02/27/20   [name], [name], DO  levothyroxine (SYNTHROID) 50 MCG tablet Take 1 tablet (50 mcg total) by mouth daily before breakfast. 07/30/19   [name], [name], DO  methotrexate (RHEUMATREX) 2.5 MG tablet Take 10 mg by mouth once a week. 4 tablets once a week 07/10/19   [provider]  montelukast  (SINGULAIR) 10 MG tablet Take 1 tablet (10 mg total) by mouth at bedtime. 03/03/20   [name], [name], DO  Multiple Vitamins-Minerals (PRESERVISION AREDS 2) CAPS Take by mouth.    [provider]  pramipexole (MIRAPEX) 0.25 MG tablet TK 1 T PO 1 HOUR BEFORE  BEDTIME 02/05/20   [name], [name], DO  prednisoLONE acetate (PRED FORTE) 1 % ophthalmic suspension  08/22/17   [provider]  predniSONE (DELTASONE) 5 MG tablet 5 mg. 07/10/19   [provider]  Vitamin D, Ergocalciferol, (DRISDOL) 1.25 MG (50000 UNIT) CAPS capsule 1 capsule    [provider]    Allergies    Augmentin [amoxicillin-pot clavulanate]  Review of Systems   Review of Systems  Constitutional: Positive for appetite change. Negative for diaphoresis and fever.  HENT: Negative for rhinorrhea and sore throat.   Eyes: Negative for redness.  Respiratory: Negative for cough.   Cardiovascular: Negative for chest pain.  Gastrointestinal: Positive for abdominal pain. Negative for diarrhea, nausea and vomiting.  Genitourinary: Negative for dysuria, frequency, hematuria and urgency.  Musculoskeletal: Negative for myalgias.  Skin: Negative for rash.  Neurological: Positive for weakness. Negative for headaches.    Physical Exam Updated Vital Signs BP (!) 141/81 (BP Location: Left Arm)   Pulse 84   Temp 98.2 F (36.8 C) (Oral)   Resp 20   Ht 5' (1.524 m)   Wt 57.6 kg   SpO2 100%   BMI 24.80 kg/m   Physical Exam Vitals and nursing note reviewed.  Constitutional:      General: She is not in acute distress.    Appearance: She is well-developed.  HENT:     Head: Normocephalic and atraumatic.     Right Ear: External ear normal.     Left Ear: External ear normal.     Nose: Nose normal.  Eyes:     Conjunctiva/sclera: Conjunctivae normal.  Cardiovascular:     Rate and Rhythm: Normal rate and regular rhythm.     Heart sounds: No murmur heard.   Pulmonary:     Effort: No respiratory  distress.     Breath sounds: No wheezing, rhonchi or rales.  Abdominal:     Palpations: Abdomen is soft.     Tenderness: There is abdominal tenderness in the right lower quadrant, periumbilical area and suprapubic area. There is no guarding or rebound. Negative signs include Murphy's sign and McBurney's sign.  Musculoskeletal:     Cervical back: Normal range of motion and neck supple.     Right lower leg: No edema.     Left lower leg: No edema.  Skin:    General: Skin is warm and dry.     Findings: No rash.  Neurological:     General: No focal deficit present.     Mental Status: She is alert. Mental status is at baseline.     Motor: No weakness.  Psychiatric:        Mood and Affect: Mood normal.     ED Results / Procedures / Treatments   Labs (all labs ordered are listed, but only abnormal results are displayed) Labs Reviewed  COMPREHENSIVE METABOLIC PANEL - Abnormal; Notable for the following components:      Result Value   Sodium 131 (*)    Chloride 97 (*)    Calcium 8.4 (*)    Total Protein 6.3 (*)    All other components within normal limits  CBC WITH DIFFERENTIAL/PLATELET - Abnormal; Notable for the following components:   WBC 11.7 (*)    RBC 3.68 (*)    Hemoglobin 11.7 (*)    HCT 33.6 (*)    Neutro Abs 9.5 (*)    Monocytes Absolute 1.3 (*)    All other components within normal limits  SARS CORONAVIRUS  2 (TAT 6-24 HRS)  URINALYSIS, ROUTINE W REFLEX MICROSCOPIC  LIPASE, BLOOD    EKG None  Radiology CT ABDOMEN PELVIS W CONTRAST  Result Date: 03/10/2020 CLINICAL DATA:  Right lower quadrant pain for 1 month EXAM: CT ABDOMEN AND PELVIS WITH CONTRAST TECHNIQUE: Multidetector CT imaging of the abdomen and pelvis was performed using the standard protocol following bolus administration of intravenous contrast. CONTRAST:  100 mL OMNIPAQUE 300 COMPARISON:  None. FINDINGS: Lower chest: Mild atelectatic changes are noted in the bases bilaterally. Hepatobiliary: No focal  liver abnormality is seen. No gallstones, gallbladder wall thickening, or biliary dilatation. Pancreas: Unremarkable. No pancreatic ductal dilatation or surrounding inflammatory changes. Spleen: Normal in size without focal abnormality. Adrenals/Urinary Tract: Adrenal glands are within normal limits. Kidneys show normal enhancement pattern. No renal calculi or urinary tract obstructive changes are noted. Bladder is well distended. Stomach/Bowel: Colon shows diffuse diverticular change without evidence of diverticulitis. Small bowel is within normal limits. Stomach is unremarkable. The appendix is well visualized centrally and demonstrates a normal enhancement pattern. Adjacent to the tip of the appendix however there is a focal air-fluid collection which measures at least 3.9 x 3.2 x 3.5 cm in greatest transverse, AP and craniocaudad projections. Surrounding inflammatory changes are noted adjacent to the collection. The adjacent small bowel appears mildly inflamed as well likely reactive in nature. Vascular/Lymphatic: Aortic atherosclerosis. No enlarged abdominal or pelvic lymph nodes. Reproductive: Uterus and bilateral adnexa are unremarkable. Mild changes of pelvic varices are noted. Other: No abdominal wall hernia or abnormality. No abdominopelvic ascites. Musculoskeletal: No acute or significant osseous findings. IMPRESSION: Air-fluid collection in the mid pelvis as described which lies adjacent to the tip of a otherwise normal appearing appendix. Given the longstanding symptomatology, this may represent a remote distal appendiceal perforation with associated abscess despite the fact that the appendix appears somewhat normal in appearance. Other consideration would be sigmoid diverticulitis although the sigmoid appears somewhat remote to the air-fluid collection. Additionally, the possibility of contained small bowel perforation with abscess formation should be considered although felt to be less likely Critical  Value/emergent results were called by telephone at the time of interpretation on 03/10/2020 at 5:40 pm to [facility] , who verbally acknowledged these results. Electronically Signed   By: Inez Catalina M.D.   On: 03/10/2020 17:47    Procedures Procedures (including critical care time)  Medications Ordered in ED Medications  metroNIDAZOLE (FLAGYL) IVPB 500 mg (500 mg Intravenous New Bag/Given 03/10/20 1844)  ceFEPIme (MAXIPIME) 2 g in sodium chloride 0.9 % 100 mL IVPB (2 g Intravenous New Bag/Given 03/10/20 1840)  sodium chloride 0.9 % bolus 500 mL (0 mLs Intravenous Stopped 03/10/20 1740)  iohexol (OMNIPAQUE) 300 MG/ML solution 100 mL (75 mLs Intravenous Contrast Given 03/10/20 1704)    ED Course  I have reviewed the triage vital signs and the nursing notes.  Pertinent labs & imaging results that were available during my care of the patient were reviewed by me and considered in my medical decision making (see chart for details).  Patient seen and examined. Work-up initiated. Fluids ordered. WBC/neutrophils are slightly elevated.  Patient is on a low-dose of prednisone, Plaquenil, methotrexate.  Given her symptoms, mildly elevated white blood cell count, immunocompromise state --after discussion with patient and daughter at bedside, agreed to proceed with CT imaging of the abdomen and pelvis to rule out more concerning etiologies of her pain.  Vital signs reviewed and are as follows: BP (!) 141/81 (BP Location: Left Arm)  Pulse 84   Temp 98.2 F (36.8 C) (Oral)   Resp 20   Ht 5' (1.524 m)   Wt 57.6 kg   SpO2 100%   BMI 24.80 kg/m   6:44 PM discussed case earlier with radiologist.  Patient has an intra-abdominal abscess.  Patient will be started on cefepime and Flagyl.  She has an allergy to Augmentin that involves swelling around the eyes only.  Discussed this with pharmacy.  Patient and family have been updated.  Agree with admission.  I spoke with Dr. Jonelle Sidle, [facility]  hospitalist.  Request that I speak with surgery.  They will admit for IV antibiotics.  I spoke with Dr. Donne Hazel of general surgery.  They do not have a preference as to which hospital the patient goes to.  Agrees with antibiotics at this point.  Requests surgical consult at whichever site the patient arrives to.   MDM Rules/Calculators/A&P                          Admit.    Final Clinical Impression(s) / ED Diagnoses Final diagnoses:  Intra-abdominal abscess [facility])    Rx / [place] Orders ED Discharge Orders    None       Carlisle Cater, PA-C 03/10/20 1845    Truddie Hidden, MD 03/10/20 2316

## 2020-03-10 NOTE — ED Notes (Signed)
Patient transported to CT 

## 2020-03-10 NOTE — ED Triage Notes (Addendum)
Pt c/o intermittent abd cramps x 1 month-denies n/v/d-states she spoke with PCP office nurse and advised to come to ED-NAD-slow steady gait-reports recent PNA with abx-abd c/o started prior to dx and abx

## 2020-03-10 NOTE — Progress Notes (Signed)
Pharmacy Antibiotic Note  Margaret [name] is a 87 y.o. female admitted on 03/10/2020 with CT abdomen showing possible IAI.  Pharmacy has been consulted for Cefepime dosing.  Allergy noted, will trial 4th gen cephalosporin with low cross-sensitivity.    Plan: Cefepime 2g IV every 12 hours Monitor renal function, clinical progression and LOT  Height: 5' (152.4 cm) Weight: 57.6 kg (127 lb) IBW/kg (Calculated) : 45.5  Temp (24hrs), Avg:98.5 F (36.9 C), Min:98.2 F (36.8 C), Max:98.8 F (37.1 C)  Recent Labs  Lab 03/10/20 1449  WBC 11.7*  CREATININE 0.63    Estimated Creatinine Clearance: 38.6 mL/min (by C-G formula based on SCr of 0.63 mg/dL).    Allergies  Allergen Reactions  . Augmentin [Amoxicillin-Pot Clavulanate] Swelling and Rash    Swelling in face    Bertis Ruddy, PharmD Clinical Pharmacist ED Pharmacist Phone # [phone] 03/10/2020 5:52 PM

## 2020-03-10 NOTE — Telephone Encounter (Signed)
Patient currently being evaluated/treated in ER.    [facility] - Client TELEPHONE ADVICE RECORD [facility] Patient Name: Margaret [name] Gender: Female DOB: Jan 10, 1930 Age: 87 Y 6 M 26 D Return Phone Number: [phone] (Primary), [phone] (Secondary) Address[place] [address] Client [facility] - Client Client Site [place] - Day Physician Raoul Pitch, [place] Contact Type Call Who Is Calling Patient / Member / Family / Caregiver Call Type Triage / Clinical Caller Name Rolan Bucco Relationship To Patient Daughter Return Phone Number [phone] (Primary) Chief Complaint Abdominal Pain Reason for Call Symptomatic / Request for [place] states that her mom was diagnosed with pneumonia a week and a half ago and has just finished her antibiotics, she is now having abdominal pains and weakness. [place] Not Listed [facility] ED Translation No Nurse Assessment Nurse: Jearld Pies, RN, Lovena Le Date/Time (Eastern Time): 03/10/2020 10:23:54 AM Confirm and document reason for call. If symptomatic, describe symptoms. ---Caller states that her mom was diagnosed with pneumonia a week and a half ago and has just finished her antibiotics, she is now having abdominal pains and weakness. Current pain level 8/10 lower abdomen that started several weeks ago. Drinking fluids and urinating normally. Denies difficulty breathing, chest pain, vd, fever, or any other symptoms at this time. Does the patient have any new or worsening symptoms? ---Yes Will a triage be completed? ---Yes Related visit to physician within the last 2 weeks? ---Yes Does the PT have any chronic conditions? (i.e. diabetes, asthma, this includes High risk factors for pregnancy, etc.) ---No Is this a behavioral health or substance abuse call? ---No Guidelines Guideline Title Affirmed Question Affirmed  Notes Nurse Date/Time Eilene Ghazi Time) Abdominal Pain - Female [1] SEVERE pain AND [2] age > 79 years Jake Bathe 03/10/2020 10:28:14 AM Disp. Time Eilene Ghazi Time) Disposition Final User 03/10/2020 10:30:53 AM Go to ED Now Yes Jearld Pies, RN, Lovena Le PLEASE NOTE: All timestamps contained within this report are represented as [place] Standard Time. CONFIDENTIALTY NOTICE: This fax transmission is intended only for the addressee. It contains information that is legally privileged, confidential or otherwise protected from use or disclosure. If you are not the intended recipient, you are strictly prohibited from reviewing, disclosing, copying using or disseminating any of this information or taking any action in reliance on or regarding this information. If you have received this fax in error, please notify us immediately by telephone so that we can arrange for its return to [place]. Phone: [phone], Toll-Free: [phone], Fax: [phone] Page: 2 of 2 Call Id: [id number] [place] Disagree/Comply Comply Caller Understands Yes PreDisposition InappropriateToAsk Care Advice Given Per Guideline * Leave now. Drive carefully. * Go to the ED at ___________ Hospital. * You need to be seen in the Emergency Department. GO TO ED NOW: ANOTHER ADULT SHOULD DRIVE: * It is better and safer if another adult drives instead of you. Referrals GO TO FACILITY OTHER - SPECIFY

## 2020-03-11 ENCOUNTER — Encounter (HOSPITAL_COMMUNITY): Payer: Self-pay | Admitting: Family Medicine

## 2020-03-11 DIAGNOSIS — E861 Hypovolemia: Secondary | ICD-10-CM | POA: Diagnosis present

## 2020-03-11 DIAGNOSIS — J4520 Mild intermittent asthma, uncomplicated: Secondary | ICD-10-CM

## 2020-03-11 DIAGNOSIS — E039 Hypothyroidism, unspecified: Secondary | ICD-10-CM | POA: Diagnosis present

## 2020-03-11 DIAGNOSIS — I739 Peripheral vascular disease, unspecified: Secondary | ICD-10-CM | POA: Diagnosis present

## 2020-03-11 DIAGNOSIS — M0600 Rheumatoid arthritis without rheumatoid factor, unspecified site: Secondary | ICD-10-CM | POA: Diagnosis present

## 2020-03-11 DIAGNOSIS — E871 Hypo-osmolality and hyponatremia: Secondary | ICD-10-CM

## 2020-03-11 DIAGNOSIS — K651 Peritoneal abscess: Secondary | ICD-10-CM | POA: Diagnosis present

## 2020-03-11 DIAGNOSIS — Z79890 Hormone replacement therapy (postmenopausal): Secondary | ICD-10-CM | POA: Diagnosis not present

## 2020-03-11 DIAGNOSIS — Z7952 Long term (current) use of systemic steroids: Secondary | ICD-10-CM | POA: Diagnosis not present

## 2020-03-11 DIAGNOSIS — M064 Inflammatory polyarthropathy: Secondary | ICD-10-CM | POA: Diagnosis present

## 2020-03-11 DIAGNOSIS — Z87891 Personal history of nicotine dependence: Secondary | ICD-10-CM | POA: Diagnosis not present

## 2020-03-11 DIAGNOSIS — Z20822 Contact with and (suspected) exposure to covid-19: Secondary | ICD-10-CM | POA: Diagnosis present

## 2020-03-11 DIAGNOSIS — Z79899 Other long term (current) drug therapy: Secondary | ICD-10-CM | POA: Diagnosis not present

## 2020-03-11 DIAGNOSIS — J45909 Unspecified asthma, uncomplicated: Secondary | ICD-10-CM | POA: Diagnosis present

## 2020-03-11 LAB — CBC
HCT: 29.8 % — ABNORMAL LOW (ref 36.0–46.0)
Hemoglobin: 10.1 g/dL — ABNORMAL LOW (ref 12.0–15.0)
MCH: 31.4 pg (ref 26.0–34.0)
MCHC: 33.9 g/dL (ref 30.0–36.0)
MCV: 92.5 fL (ref 80.0–100.0)
Platelets: 277 10*3/uL (ref 150–400)
RBC: 3.22 MIL/uL — ABNORMAL LOW (ref 3.87–5.11)
RDW: 12.7 % (ref 11.5–15.5)
WBC: 7.9 10*3/uL (ref 4.0–10.5)
nRBC: 0 % (ref 0.0–0.2)

## 2020-03-11 LAB — BASIC METABOLIC PANEL
Anion gap: 10 (ref 5–15)
BUN: 10 mg/dL (ref 8–23)
CO2: 22 mmol/L (ref 22–32)
Calcium: 8 mg/dL — ABNORMAL LOW (ref 8.9–10.3)
Chloride: 104 mmol/L (ref 98–111)
Creatinine, Ser: 0.73 mg/dL (ref 0.44–1.00)
GFR, Estimated: >60 mL/min (ref >60)
Glucose, Bld: 98 mg/dL (ref 70–99)
Potassium: 3.7 mmol/L (ref 3.5–5.1)
Sodium: 136 mmol/L (ref 135–145)

## 2020-03-11 LAB — SARS CORONAVIRUS 2 (TAT 6-24 HRS): SARS Coronavirus 2: NEGATIVE

## 2020-03-11 MED ORDER — SODIUM CHLORIDE 0.9 % IV SOLN
INTRAVENOUS | Status: DC | PRN
  Administered 2020-03-11: 500 mL via INTRAVENOUS

## 2020-03-11 MED ORDER — BENZONATATE 100 MG PO CAPS: 100.0000 mg | ORAL_CAPSULE | Freq: Three times a day (TID) | ORAL | Status: DC | PRN

## 2020-03-11 MED ORDER — FOLIC ACID 1 MG PO TABS
1.0000 mg | ORAL_TABLET | Freq: Every day | ORAL | Status: DC
Start: 2020-03-11 — End: 2020-03-15
  Administered 2020-03-11 – 2020-03-15 (×4): 1 mg via ORAL
  Filled 2020-03-11 (×4): qty 1

## 2020-03-11 MED ORDER — LEVOTHYROXINE SODIUM 50 MCG PO TABS
50.0000 ug | ORAL_TABLET | Freq: Every day | ORAL | Status: DC
  Administered 2020-03-11 – 2020-03-15 (×5): 50 ug via ORAL
  Filled 2020-03-11 (×5): qty 1

## 2020-03-11 MED ORDER — ACETAMINOPHEN 325 MG PO TABS
650.0000 mg | ORAL_TABLET | Freq: Four times a day (QID) | ORAL | Status: DC | PRN
  Administered 2020-03-11 – 2020-03-12 (×3): 650 mg via ORAL
  Filled 2020-03-11 (×3): qty 2

## 2020-03-11 MED ORDER — ONDANSETRON HCL 4 MG/2ML IJ SOLN
4.0000 mg | Freq: Four times a day (QID) | INTRAMUSCULAR | Status: DC | PRN
  Administered 2020-03-12: 4 mg via INTRAVENOUS
  Filled 2020-03-11: qty 2

## 2020-03-11 MED ORDER — OXYCODONE HCL 5 MG PO TABS: 5.0000 mg | ORAL_TABLET | ORAL | Status: DC | PRN

## 2020-03-11 MED ORDER — PREDNISONE 5 MG PO TABS: 5.0000 mg | ORAL_TABLET | Freq: Every day | ORAL | Status: DC

## 2020-03-11 MED ORDER — ALBUTEROL SULFATE HFA 108 (90 BASE) MCG/ACT IN AERS: 2.0000 | INHALATION_SPRAY | Freq: Four times a day (QID) | RESPIRATORY_TRACT | Status: DC | PRN

## 2020-03-11 MED ORDER — SODIUM CHLORIDE 0.9 % IV SOLN: INTRAVENOUS | Status: AC

## 2020-03-11 MED ORDER — ENOXAPARIN SODIUM 40 MG/0.4ML ~~LOC~~ SOLN
40.0000 mg | SUBCUTANEOUS | Status: DC
  Administered 2020-03-11 – 2020-03-15 (×5): 40 mg via SUBCUTANEOUS
  Filled 2020-03-11 (×5): qty 0.4

## 2020-03-11 MED ORDER — MONTELUKAST SODIUM 10 MG PO TABS
10.0000 mg | ORAL_TABLET | Freq: Every day | ORAL | Status: DC
  Administered 2020-03-11 – 2020-03-14 (×4): 10 mg via ORAL
  Filled 2020-03-11 (×4): qty 1

## 2020-03-11 NOTE — Progress Notes (Signed)
Initial Nutrition Assessment  RD working remotely.  DOCUMENTATION CODES:   Not applicable  INTERVENTION:  - diet advancement as medically feasible. - complete NFPE when feasible.   NUTRITION DIAGNOSIS:   Inadequate oral intake related to inability to eat as evidenced by NPO status.  GOAL:   Patient will meet greater than or equal to 90% of their needs  MONITOR:   Diet advancement,Labs,Weight trends  REASON FOR ASSESSMENT:   Malnutrition Screening Tool  ASSESSMENT:   87 y.o. female with medical history of inflammatory arthritis, hypothyroidism, asthma, and recent PNA. She presented to the ED due to lower abdominal pain. Patient reported developing cramping discomfort in the RLQ in early 12/2019 with intermittent symptoms since then without N/V/D. Pain became more consistent and worsened several days PTA. She has been experiencing night sweats recently.  She has been NPO since admission. She has not been seen by a [facility] RD at any time in the past.   Weight yesterday was 127 lb and PTA the most recently documented weight was on 02/27/20 when she weighed 126 lb. Prior to that, most recent weight was on 07/30/19 when she weighed 131 lb. This indicates 4 lb weight loss (3% body weight) in 7 months; not significant for time frame.   No information documented in the edema section of flow sheet.  Per notes: - intraabdominal abscess - inflammatory arthritis which mainly affects her shoulders, hips, and knees - hyponatremia on admission--now resolved   Labs reviewed; Ca: 8 mg/dl. Medications reviewed; 1 mg folvite/day, 50 mcg oral synthroid/day. IVF; NS @ 100 ml/hr.     NUTRITION - FOCUSED PHYSICAL EXAM:  unable to complete at this time.   Diet Order:   Diet Order            Diet NPO time specified Except for: Sips with Meds, Ice Chips  Diet effective now                 EDUCATION NEEDS:   Not appropriate for education at this time  Skin:  Skin Assessment:  Reviewed RN Assessment  Last BM:  PTA/unknown  Height:   Ht Readings from Last 1 Encounters:  03/10/20 5' (1.524 m)    Weight:   Wt Readings from Last 1 Encounters:  03/10/20 57.6 kg     Estimated Nutritional Needs:  Kcal:  1400-1600 kcal Protein:  70-80 grams Fluid:  >/= 1.6 L/day      Jarome Matin, MS, RD, LDN, CNSC Inpatient Clinical Dietitian RD pager # available in [facility]  After hours/weekend pager # available in [facility]

## 2020-03-11 NOTE — H&P (Signed)
History and Physical    Davan Hark RCV:[id number] DOB: 1929-03-25 DOA: 03/10/2020  PCP: Ma Hillock, DO   Patient coming from: Home   Chief Complaint: Lower abdominal pain   HPI: Margaret [name] is a 87 y.o. female with medical history significant for inflammatory arthritis, hypothyroidism, asthma, and recent pneumonia, presenting to the emergency department for evaluation of lower abdominal pain.  Patient reports that she developed cramping discomfort in the right lower quadrant of her abdomen in early November 2021, experienced intermittent symptoms since then without any nausea, vomiting, or diarrhea, but then had worsening and more consistent pain beginning several days ago.  She has not noticed any fevers or chills but does note that she has had some night sweats recently.  She was worried that this might be appendicitis, prompting her presentation to the ED.  [facility] ED Course: Upon arrival to the ED, patient is found to be afebrile, saturating well on room air, and with stable blood pressure.  Chemistry panel notable for sodium 131 and CBC features a leukocytosis to 11,700.  COVID-19 PCR is negative.  CT of the abdomen and pelvis notable for an air-fluid collection of 3.9 cm in the mid pelvis adjacent to the tip of an otherwise normal-appearing appendix.  Patient was given 500 cc of saline, cefepime, and Flagyl in the ED.  ED PA discussed the case with hospitalist and surgery and patient was transferred to [facility].   Review of Systems:  All other systems reviewed and apart from HPI, are negative.  Past Medical History:  Diagnosis Date  . Allergy   . Arthritis   . Arthritis of left knee   . Asthma   . Carotid artery stenosis 10/17/2012   stable report in 2018- "moderate on right, minimal on left"  . Carpal tunnel syndrome 2018   left.  . Cervical radiculopathy 05/16/2015   DDD C4-C5, C5-C6, and C6-C7.  Facet arthropathy throughout cervical  spine.  Normal foraminal narrowing secondary to uncovertebral  arthropathy is seen bilaterally at C3-C4 and C5/C6  . Chicken pox   . DDD (degenerative disc disease), lumbar 04/26/2015   Mild levoscoliosis, right anterior listhesis of L4 and L5.  DDD at every level.  Facet arthropathy at multiple levels.  . Deviated septum   . Glaucoma    both eyes, mild opened angle  . Heart murmur   . History of colon polyps   . History of fall   . History of frequent urinary tract infections    klebs- pansensitive. c. freundii - resitant to cefazolin and augementin  . Hypothyroidism   . Iron deficiency anemia    prior pcp told her to take Fe 325 QD  . Macular degeneration    h/o retinal edema  . Mitral valve prolapse 08/27/2011  . Murmur, cardiac 10/17/2012  . Osteoporosis   . 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 Pseudophakia of both eyes   . PVD (peripheral vascular disease) ([place])   . Rheumatic fever    87 years old   . Syncope 05/17/2017  . Thyroid disease   . Urinary incontinence   . Vertigo    had been prescribed antivert  . Vitamin D deficiency     Past Surgical History:  Procedure Laterality Date  . CARPAL TUNNEL RELEASE Bilateral 2019  . CATARACT EXTRACTION  1997  . colonoscopy   2010  . NASAL SEPTUM SURGERY  2007  . WRIST FRACTURE SURGERY Right 1996  Social History:   reports that she quit smoking about 57 years ago. Her smoking use included cigarettes. She started smoking about 70 years ago. She smoked 0.50 packs per day. She has never used smokeless tobacco. She reports current alcohol use of about 1.0 standard drink of alcohol per week. She reports that she does not use drugs.  Allergies  Allergen Reactions  . Augmentin [Amoxicillin-Pot Clavulanate] Swelling and Rash    Swelling in face    Family History  Problem Relation Age of Onset  . Arthritis Mother   . COPD Mother   . Prostate cancer Father   . Arthritis Father   . Hearing loss Father   . Heart disease  Father   . Heart attack Father   . Blindness Maternal Grandmother   . Stroke Maternal Grandmother   . Liver cancer Sister   . Arthritis Brother   . Prostate cancer Brother   . COPD Brother   . Heart disease Brother   . Breast cancer Daughter   . COPD Paternal Grandmother   . Breast cancer Paternal Grandmother      Prior to Admission medications   Medication Sig Start Date End Date Taking? Authorizing Provider  acetaminophen (TYLENOL) 500 MG tablet 1 tablet as needed    [provider]  albuterol (VENTOLIN HFA) 108 (90 Base) MCG/ACT inhaler Inhale 2 puffs into the lungs every 6 (six) hours as needed for wheezing or shortness of breath. 02/25/20   Terressa Koyanagi, DO  benzonatate (TESSALON) 100 MG capsule Take 1 capsule (100 mg total) by mouth 3 (three) times daily as needed for cough. 02/27/20   Claiborne Billings, [name], DO  fluticasone (FLONASE) 50 MCG/ACT nasal spray  08/08/17   [provider]  folic acid (FOLVITE) 0.5 MG tablet Take 0.5 mg by mouth daily.    [provider]  hydroxychloroquine (PLAQUENIL) 200 MG tablet Take 300 mg by mouth daily. 06/04/19   [provider]  levofloxacin (LEVAQUIN) 750 MG tablet Take 1 tablet (750 mg total) by mouth daily. 02/27/20   [name], [name], DO  levothyroxine (SYNTHROID) 50 MCG tablet Take 1 tablet (50 mcg total) by mouth daily before breakfast. 07/30/19   [name], [name], DO  methotrexate (RHEUMATREX) 2.5 MG tablet Take 10 mg by mouth once a week. 4 tablets once a week 07/10/19   [provider]  montelukast (SINGULAIR) 10 MG tablet Take 1 tablet (10 mg total) by mouth at bedtime. 03/03/20   [name], [name], DO  Multiple Vitamins-Minerals (PRESERVISION AREDS 2) CAPS Take by mouth.    [provider]  pramipexole (MIRAPEX) 0.25 MG tablet TK 1 T PO 1 HOUR BEFORE BEDTIME 02/05/20   [name], [name], DO  prednisoLONE acetate (PRED FORTE) 1 % ophthalmic suspension  08/22/17   [provider]  predniSONE  (DELTASONE) 5 MG tablet 5 mg. 07/10/19   [provider]  Vitamin D, Ergocalciferol, (DRISDOL) 1.25 MG (50000 UNIT) CAPS capsule 1 capsule    [provider]    Physical Exam: Vitals:   03/10/20 2230 03/10/20 2300 03/11/20 0000 03/11/20 0209  BP: 132/60 117/62  126/68  Pulse: 71 71  63  Resp: 18 16 14  (!) 24  Temp:    98.5 F (36.9 C)  TempSrc:    Oral  SpO2: 98% 98%  97%  Weight:      Height:        Constitutional: NAD, calm  Eyes: PERTLA, lids and conjunctivae normal ENMT:  Mucous membranes are moist. Posterior pharynx clear of any exudate or lesions.   Neck: normal, supple, no masses, no thyromegaly Respiratory: no wheezing, no crackles. No accessory muscle use.  Cardiovascular: S1 & S2 heard, regular rate and rhythm. No extremity edema.   Abdomen: No distension, no tenderness, soft. Bowel sounds active.  Musculoskeletal: no clubbing / cyanosis. No joint deformity upper and lower extremities.   Skin: no significant rashes, lesions, ulcers. Warm, dry, well-perfused. Neurologic: CN 2-12 grossly intact. Sensation intact. Moving all extremities.  Psychiatric: Alert and oriented to person, place, and situation. Very pleasant and cooperative.    Labs and Imaging on Admission: I have personally reviewed following labs and imaging studies  CBC: Recent Labs  Lab 03/10/20 1449  WBC 11.7*  NEUTROABS 9.5*  HGB 11.7*  HCT 33.6*  MCV 91.3  PLT 706   Basic Metabolic Panel: Recent Labs  Lab 03/10/20 1449  NA 131*  K 3.7  CL 97*  CO2 24  GLUCOSE 98  BUN 10  CREATININE 0.63  CALCIUM 8.4*   GFR: Estimated Creatinine Clearance: 38.6 mL/min (by C-G formula based on SCr of 0.63 mg/dL). Liver Function Tests: Recent Labs  Lab 03/10/20 1449  AST 40  ALT 32  ALKPHOS 124  BILITOT 0.8  PROT 6.3*  ALBUMIN 3.5   Recent Labs  Lab 03/10/20 1449  LIPASE 31   No results for input(s): AMMONIA in the last 168 hours. Coagulation Profile: No results for  input(s): INR, PROTIME in the last 168 hours. Cardiac Enzymes: No results for input(s): CKTOTAL, CKMB, CKMBINDEX, TROPONINI in the last 168 hours. BNP (last 3 results) No results for input(s): PROBNP in the last 8760 hours. HbA1C: No results for input(s): HGBA1C in the last 72 hours. CBG: No results for input(s): GLUCAP in the last 168 hours. Lipid Profile: No results for input(s): CHOL, HDL, LDLCALC, TRIG, CHOLHDL, LDLDIRECT in the last 72 hours. Thyroid Function Tests: No results for input(s): TSH, T4TOTAL, FREET4, T3FREE, THYROIDAB in the last 72 hours. Anemia Panel: No results for input(s): VITAMINB12, FOLATE, FERRITIN, TIBC, IRON, RETICCTPCT in the last 72 hours. Urine analysis:    Component Value Date/Time   COLORURINE YELLOW 03/10/2020 [place] 03/10/2020 1606   LABSPEC 1.005 03/10/2020 1606   PHURINE 6.0 03/10/2020 1606   GLUCOSEU NEGATIVE 03/10/2020 1606   HGBUR NEGATIVE 03/10/2020 1606   BILIRUBINUR NEGATIVE 03/10/2020 1606   KETONESUR NEGATIVE 03/10/2020 1606   PROTEINUR NEGATIVE 03/10/2020 1606   NITRITE NEGATIVE 03/10/2020 1606   LEUKOCYTESUR NEGATIVE 03/10/2020 1606   Sepsis Labs: @LABRCNTIP (procalcitonin:4,lacticidven:4) ) Recent Results (from the past 240 hour(s))  SARS CORONAVIRUS 2 (TAT 6-24 HRS) Nasopharyngeal Nasopharyngeal Swab     Status: None   Collection Time: 03/10/20  6:37 PM   Specimen: Nasopharyngeal Swab  Result Value Ref Range Status   SARS Coronavirus 2 NEGATIVE NEGATIVE Final    Comment: (NOTE) SARS-CoV-2 target nucleic acids are NOT DETECTED.  The SARS-CoV-2 RNA is generally detectable in upper and lower respiratory specimens during the acute phase of infection. Negative results do not preclude SARS-CoV-2 infection, do not rule out co-infections with other pathogens, and should not be used as the sole basis for treatment or other patient management decisions. Negative results must be combined with clinical  observations, patient history, and epidemiological information. The expected result is Negative.  Fact Sheet for Patients: SugarRoll.be  Fact Sheet for Healthcare Providers: https://www.woods-mathews.com/  This test is not yet approved or cleared by the [place]  FDA and  has been authorized for detection and/or diagnosis of SARS-CoV-2 by FDA under an Emergency Use Authorization (EUA). This EUA will remain  in effect (meaning this test can be used) for the duration of the COVID-19 declaration under Se ction 564(b)(1) of the Act, 21 U.S.C. section 360bbb-3(b)(1), unless the authorization is terminated or revoked sooner.  Performed at [facility], [place] [address], [place], [place] [address]      Radiological Exams on Admission: CT ABDOMEN PELVIS W CONTRAST  Result Date: 03/10/2020 CLINICAL DATA:  Right lower quadrant pain for 1 month EXAM: CT ABDOMEN AND PELVIS WITH CONTRAST TECHNIQUE: Multidetector CT imaging of the abdomen and pelvis was performed using the standard protocol following bolus administration of intravenous contrast. CONTRAST:  100 mL OMNIPAQUE 300 COMPARISON:  None. FINDINGS: Lower chest: Mild atelectatic changes are noted in the bases bilaterally. Hepatobiliary: No focal liver abnormality is seen. No gallstones, gallbladder wall thickening, or biliary dilatation. Pancreas: Unremarkable. No pancreatic ductal dilatation or surrounding inflammatory changes. Spleen: Normal in size without focal abnormality. Adrenals/Urinary Tract: Adrenal glands are within normal limits. Kidneys show normal enhancement pattern. No renal calculi or urinary tract obstructive changes are noted. Bladder is well distended. Stomach/Bowel: Colon shows diffuse diverticular change without evidence of diverticulitis. Small bowel is within normal limits. Stomach is unremarkable. The appendix is well visualized centrally and demonstrates a normal enhancement  pattern. Adjacent to the tip of the appendix however there is a focal air-fluid collection which measures at least 3.9 x 3.2 x 3.5 cm in greatest transverse, AP and craniocaudad projections. Surrounding inflammatory changes are noted adjacent to the collection. The adjacent small bowel appears mildly inflamed as well likely reactive in nature. Vascular/Lymphatic: Aortic atherosclerosis. No enlarged abdominal or pelvic lymph nodes. Reproductive: Uterus and bilateral adnexa are unremarkable. Mild changes of pelvic varices are noted. Other: No abdominal wall hernia or abnormality. No abdominopelvic ascites. Musculoskeletal: No acute or significant osseous findings. IMPRESSION: Air-fluid collection in the mid pelvis as described which lies adjacent to the tip of a otherwise normal appearing appendix. Given the longstanding symptomatology, this may represent a remote distal appendiceal perforation with associated abscess despite the fact that the appendix appears somewhat normal in appearance. Other consideration would be sigmoid diverticulitis although the sigmoid appears somewhat remote to the air-fluid collection. Additionally, the possibility of contained small bowel perforation with abscess formation should be considered although felt to be less likely Critical Value/emergent results were called by telephone at the time of interpretation on 03/10/2020 at 5:40 pm to [facility], [place] , who verbally acknowledged these results. Electronically Signed   By: Inez Catalina M.D.   On: 03/10/2020 17:47    Assessment/Plan   1. Intraabdominal abscess  - Presents with 2 mos of RLQ pain that became more severe and more constant over the past several days and is found to have 3.9 cm air-fluid collection near the tip of appendix with surrounding inflammatory changes  - She has mild leukocytosis, no SIRS criteria  - Cefepime and Flagyl started in ED  - Continue IV antibiotics, may need percutaneous drainage or surgery if  fails conservative management    2. Inflammatory arthritis  - Affects shoulders, hips, knees  - Stable  - She is no longer taking low-dose prednisone  - Hold DMARDs until infection improving   3. Hyponatremia  - Serum sodium 131 in setting of hypovolemia  - Continue IVF hydration with saline and repeat chem panel    4. Asthma  -  Stable, no cough or wheezing on admission  - Continue Singulair, prn albuterol    5. Hypothyroidism  - Continue Synthroid     DVT prophylaxis: Lovenox Code Status: Full  Family Communication: Discussed with patient  Disposition Plan:  Patient is from: Home  Anticipated d/c is to: TBD Anticipated d/c date is: 03/15/20 Patient currently: Pending improvement with IV abx, may need percutaneous drainage or surgery  Consults called: ED PA discussed with surgery, not formally consulted   Admission status: Inpatient    Vianne Bulls, MD [facility]  03/11/2020, 4:08 AM

## 2020-03-11 NOTE — Consult Note (Addendum)
Margaret [name] May 07, 1929  [id number].    Requesting MD: Dr. Myna Hidalgo Chief Complaint/Reason for Consult: Intra-abdominal fluid collection  HPI: Margaret [name] is a 87 y.o. female with a hx of hypothyroidism who presented to the emergency department for abdominal pain.  Patient reports in November she began having intermittent episodes of abdominal pain.  She describes these as crampy pain in the right lower quadrant that would last for several minutes and recur every 15 minutes.  She reports the pains would occur intermittently for several days and then resolved spontaneously.  She reports no pattern with reoccurrence of her pain.  She reports over the last several days her pain changed from intermittent to constant and became more severe.  She reports associated decreased appetite.  She denies any associated fever, chills, nausea, vomiting, constipation or diarrhea.  She is still passing flatus.  Last bowel movement was yesterday and normal.  She denies history of prior abdominal surgeries.  Her last colonoscopy was 8-10 years ago in [place].  She reports this showed polyps but was otherwise negative.  She denies history of diverticulitis.  She underwent work-up in the ED that showed air and fluid collection in the mid pelvis that could be secondary to remote distal appendiceal perforation with associated abscess versus sigmoid diverticulitis versus contained small bowel perforation with abscess formation.  Patient was admitted to medicine service.  We were asked to see. She reports since admission her abdominal pain has resolved.   ROS: Review of Systems  Constitutional: Negative for chills and fever.  Respiratory: Negative for cough and shortness of breath.   Cardiovascular: Negative for chest pain and leg swelling.  Gastrointestinal: Positive for abdominal pain. Negative for constipation, diarrhea, nausea and vomiting.  Genitourinary: Negative for dysuria.  Musculoskeletal: Negative for back  pain.  Psychiatric/Behavioral: Negative for substance abuse.  All other systems reviewed and are negative.   Family History  Problem Relation Age of Onset  . Arthritis Mother   . COPD Mother   . Prostate cancer Father   . Arthritis Father   . Hearing loss Father   . Heart disease Father   . Heart attack Father   . Blindness Maternal Grandmother   . Stroke Maternal Grandmother   . Liver cancer Sister   . Arthritis Brother   . Prostate cancer Brother   . COPD Brother   . Heart disease Brother   . Breast cancer Daughter   . COPD Paternal Grandmother   . Breast cancer Paternal Grandmother     Past Medical History:  Diagnosis Date  . Allergy   . Arthritis   . Arthritis of left knee   . Asthma   . Carotid artery stenosis 10/17/2012   stable report in 2018- "moderate on right, minimal on left"  . Carpal tunnel syndrome 2018   left.  . Cervical radiculopathy 05/16/2015   DDD C4-C5, C5-C6, and C6-C7.  Facet arthropathy throughout cervical spine.  Normal foraminal narrowing secondary to uncovertebral  arthropathy is seen bilaterally at C3-C4 and C5/C6  . Chicken pox   . DDD (degenerative disc disease), lumbar 04/26/2015   Mild levoscoliosis, right anterior listhesis of L4 and L5.  DDD at every level.  Facet arthropathy at multiple levels.  . Deviated septum   . Glaucoma    both eyes, mild opened angle  . Heart murmur   . History of colon polyps   . History of fall   . History of frequent urinary tract infections  klebs- pansensitive. c. freundii - resitant to cefazolin and augementin  . Hypothyroidism   . Iron deficiency anemia    prior pcp told her to take Fe 325 QD  . Macular degeneration    h/o retinal edema  . Mitral valve prolapse 08/27/2011  . Murmur, cardiac 10/17/2012  . Osteoporosis   . 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 Pseudophakia of both eyes   . PVD (peripheral vascular disease) ([place])   . Rheumatic fever    87 years old   . Syncope 05/17/2017  .  Thyroid disease   . Urinary incontinence   . Vertigo    had been prescribed antivert  . Vitamin D deficiency     Past Surgical History:  Procedure Laterality Date  . CARPAL TUNNEL RELEASE Bilateral 2019  . CATARACT EXTRACTION  1997  . colonoscopy   2010  . NASAL SEPTUM SURGERY  2007  . WRIST FRACTURE SURGERY Right 1996    Social History:  reports that she quit smoking about 57 years ago. Her smoking use included cigarettes. She started smoking about 70 years ago. She smoked 0.50 packs per day. She has never used smokeless tobacco. She reports current alcohol use of about 1.0 standard drink of alcohol per week. She reports that she does not use drugs. Patient reports he drinks 2-3 alcoholic beverages per week.  No tobacco use.  No illicit drug use.  Lives at home with her daughter.  She is retired.  Allergies:  Allergies  Allergen Reactions  . Augmentin [Amoxicillin-Pot Clavulanate] Swelling and Rash    Swelling in face    Medications Prior to Admission  Medication Sig Dispense Refill  . albuterol (VENTOLIN HFA) 108 (90 Base) MCG/ACT inhaler Inhale 2 puffs into the lungs every 6 (six) hours as needed for wheezing or shortness of breath. 1 each 1  . benzonatate (TESSALON) 100 MG capsule Take 1 capsule (100 mg total) by mouth 3 (three) times daily as needed for cough. 21 capsule 0  . CALCIUM PO Take 1 tablet by mouth 2 (two) times daily. Slow release    . folic acid (FOLVITE) 0.5 MG tablet Take 0.5 mg by mouth daily.    Marland Kitchen guaiFENesin (ROBITUSSIN) 100 MG/5ML liquid Take 200 mg by mouth 3 (three) times daily as needed for cough.    . hydrocortisone 1 % ointment Apply 1 application topically 4 (four) times daily as needed for itching (dry skin).    . hydroxychloroquine (PLAQUENIL) 200 MG tablet Take 200 mg by mouth daily.    Marland Kitchen levothyroxine (SYNTHROID) 50 MCG tablet Take 1 tablet (50 mcg total) by mouth daily before breakfast. 90 tablet 3  . methotrexate (RHEUMATREX) 2.5 MG tablet Take  10 mg by mouth once a week.    . montelukast (SINGULAIR) 10 MG tablet Take 1 tablet (10 mg total) by mouth at bedtime. 90 tablet 3  . Multiple Vitamins-Minerals (PRESERVISION AREDS 2) CAPS Take 1 capsule by mouth 2 (two) times daily.    . polyvinyl alcohol (LIQUIFILM TEARS) 1.4 % ophthalmic solution Place 1 drop into both eyes as needed for dry eyes.    . pramipexole (MIRAPEX) 0.25 MG tablet TK 1 T PO 1 HOUR BEFORE BEDTIME (Patient taking differently: Take 0.25 mg by mouth at bedtime.) 30 tablet 0  . predniSONE (DELTASONE) 5 MG tablet Take 5 mg by mouth daily as needed (joint/muscle pain).    . Vitamin D, Ergocalciferol, (DRISDOL) 1.25 MG (50000 UNIT) CAPS capsule Take  50,000 Units by mouth every 7 (seven) days.    Marland Kitchen levofloxacin (LEVAQUIN) 750 MG tablet Take 1 tablet (750 mg total) by mouth daily. 7 tablet 0     Physical Exam: Blood pressure 125/70, pulse 63, temperature 98.1 F (36.7 C), temperature source Oral, resp. rate 16, height 5' (1.524 m), weight 57.6 kg, SpO2 98 %. General: pleasant, WD/WN white female who is laying in bed in NAD HEENT: head is normocephalic, atraumatic.  Sclera are noninjected.  PERRL.  Ears and nose without any masses or lesions.  Mouth is pink and moist. Dentition fair Heart: regular, rate, and rhythm.  Murmur present.  Palpable pedal and radial pulses bilaterally  Lungs: CTAB, no wheezes, rhonchi, or rales noted.  Respiratory effort nonlabored Abd: Soft, ND, mild tenderness of the RLQ without rebound, rigidity or guarding. No peritonitis. +BS, no masses, hernias, or organomegaly. No prior abdominal scars.  MS: no BUE/BLE edema, calves soft and nontender Skin: warm and dry with no masses, lesions, or rashes Psych: A&Ox4 with an appropriate affect Neuro: cranial nerves grossly intact, equal strength in BUE/BLE bilaterally, normal speech, thought process intact. Gait not assessed.   Results for orders placed or performed during the hospital encounter of 03/10/20  (from the past 48 hour(s))  Comprehensive metabolic panel     Status: Abnormal   Collection Time: 03/10/20  2:49 PM  Result Value Ref Range   Sodium 131 (L) 135 - 145 mmol/L   Potassium 3.7 3.5 - 5.1 mmol/L   Chloride 97 (L) 98 - 111 mmol/L   CO2 24 22 - 32 mmol/L   Glucose, Bld 98 70 - 99 mg/dL    Comment: Glucose reference range applies only to samples taken after fasting for at least 8 hours.   BUN 10 8 - 23 mg/dL   Creatinine, Ser 0.63 0.44 - 1.00 mg/dL   Calcium 8.4 (L) 8.9 - 10.3 mg/dL   Total Protein 6.3 (L) 6.5 - 8.1 g/dL   Albumin 3.5 3.5 - 5.0 g/dL   AST 40 15 - 41 U/L   ALT 32 0 - 44 U/L   Alkaline Phosphatase 124 38 - 126 U/L   Total Bilirubin 0.8 0.3 - 1.2 mg/dL   GFR, Estimated >60 >60 mL/min    Comment: (NOTE) Calculated using the CKD-EPI Creatinine Equation (2021)    Anion gap 10 5 - 15    Comment: Performed at [facility], [address] [place], [place] [address]  CBC with Differential     Status: Abnormal   Collection Time: 03/10/20  2:49 PM  Result Value Ref Range   WBC 11.7 (H) 4.0 - 10.5 K/uL   RBC 3.68 (L) 3.87 - 5.11 MIL/uL   Hemoglobin 11.7 (L) 12.0 - 15.0 g/dL   HCT 33.6 (L) 36.0 - 46.0 %   MCV 91.3 80.0 - 100.0 fL   MCH 31.8 26.0 - 34.0 pg   MCHC 34.8 30.0 - 36.0 g/dL   RDW 12.7 11.5 - 15.5 %   Platelets 313 150 - 400 K/uL   nRBC 0.0 0.0 - 0.2 %   Neutrophils Relative % 81 %   Neutro Abs 9.5 (H) 1.7 - 7.7 K/uL   Lymphocytes Relative 6 %   Lymphs Abs 0.7 0.7 - 4.0 K/uL   Monocytes Relative 11 %   Monocytes Absolute 1.3 (H) 0.1 - 1.0 K/uL   Eosinophils Relative 1 %   Eosinophils Absolute 0.1 0.0 - 0.5 K/uL   Basophils Relative  0 %   Basophils Absolute 0.0 0.0 - 0.1 K/uL   Immature Granulocytes 1 %   Abs Immature Granulocytes 0.07 0.00 - 0.07 K/uL    Comment: Performed at [facility], [address] [place], [place] [address]  Lipase, blood     Status: None   Collection Time: 03/10/20  2:49 PM  Result Value Ref  Range   Lipase 31 11 - 51 U/L    Comment: Performed at [facility], [address] [place], [place] [address]  Urinalysis, Routine w reflex microscopic Urine, Clean Catch     Status: None   Collection Time: 03/10/20  4:06 PM  Result Value Ref Range   Color, Urine YELLOW YELLOW   APPearance CLEAR CLEAR   Specific Gravity, Urine 1.005 1.005 - 1.030   pH 6.0 5.0 - 8.0   Glucose, UA NEGATIVE NEGATIVE mg/dL   Hgb urine dipstick NEGATIVE NEGATIVE   Bilirubin Urine NEGATIVE NEGATIVE   Ketones, ur NEGATIVE NEGATIVE mg/dL   Protein, ur NEGATIVE NEGATIVE mg/dL   Nitrite NEGATIVE NEGATIVE   Leukocytes,Ua NEGATIVE NEGATIVE    Comment: Microscopic not done on urines with negative protein, blood, leukocytes, nitrite, or glucose < 500 mg/dL. Performed at [facility], [address] [place], [place] [address]   SARS CORONAVIRUS 2 (TAT 6-24 HRS) Nasopharyngeal Nasopharyngeal Swab     Status: None   Collection Time: 03/10/20  6:37 PM   Specimen: Nasopharyngeal Swab  Result Value Ref Range   SARS Coronavirus 2 NEGATIVE NEGATIVE    Comment: (NOTE) SARS-CoV-2 target nucleic acids are NOT DETECTED.  The SARS-CoV-2 RNA is generally detectable in upper and lower respiratory specimens during the acute phase of infection. Negative results do not preclude SARS-CoV-2 infection, do not rule out co-infections with other pathogens, and should not be used as the sole basis for treatment or other patient management decisions. Negative results must be combined with clinical observations, patient history, and epidemiological information. The expected result is Negative.  Fact Sheet for Patients: SugarRoll.be  Fact Sheet for Healthcare Providers: https://www.woods-mathews.com/  This test is not yet approved or cleared by the [place] FDA and  has been authorized for detection and/or diagnosis of SARS-CoV-2 by FDA under an Emergency Use  Authorization (EUA). This EUA will remain  in effect (meaning this test can be used) for the duration of the COVID-19 declaration under Se ction 564(b)(1) of the Act, 21 U.S.C. section 360bbb-3(b)(1), unless the authorization is terminated or revoked sooner.  Performed at [facility], [address] [place], [place] [id number]   Basic metabolic panel     Status: Abnormal   Collection Time: 03/11/20  6:16 AM  Result Value Ref Range   Sodium 136 135 - 145 mmol/L   Potassium 3.7 3.5 - 5.1 mmol/L   Chloride 104 98 - 111 mmol/L   CO2 22 22 - 32 mmol/L   Glucose, Bld 98 70 - 99 mg/dL    Comment: Glucose reference range applies only to samples taken after fasting for at least 8 hours.   BUN 10 8 - 23 mg/dL   Creatinine, Ser 0.73 0.44 - 1.00 mg/dL   Calcium 8.0 (L) 8.9 - 10.3 mg/dL   GFR, Estimated >60 >60 mL/min    Comment: (NOTE) Calculated using the CKD-EPI Creatinine Equation (2021)    Anion gap 10 5 - 15    Comment: Performed at [facility], [address] [place], [place] [address]  CBC     Status: Abnormal   Collection Time: 03/11/20  6:16 AM  Result Value Ref Range   WBC 7.9 4.0 - 10.5 K/uL   RBC 3.22 (L) 3.87 - 5.11 MIL/uL   Hemoglobin 10.1 (L) 12.0 - 15.0 g/dL   HCT 17.5 (L) 10.2 - 58.5 %   MCV 92.5 80.0 - 100.0 fL   MCH 31.4 26.0 - 34.0 pg   MCHC 33.9 30.0 - 36.0 g/dL   RDW 27.7 82.4 - 23.5 %   Platelets 277 150 - 400 K/uL   nRBC 0.0 0.0 - 0.2 %    Comment: Performed at [facility], [address] [place], [place] [address]   CT ABDOMEN PELVIS W CONTRAST  Result Date: 03/10/2020 CLINICAL DATA:  Right lower quadrant pain for 1 month EXAM: CT ABDOMEN AND PELVIS WITH CONTRAST TECHNIQUE: Multidetector CT imaging of the abdomen and pelvis was performed using the standard protocol following bolus administration of intravenous contrast. CONTRAST:  100 mL OMNIPAQUE 300 COMPARISON:  None. FINDINGS: Lower chest: Mild atelectatic  changes are noted in the bases bilaterally. Hepatobiliary: No focal liver abnormality is seen. No gallstones, gallbladder wall thickening, or biliary dilatation. Pancreas: Unremarkable. No pancreatic ductal dilatation or surrounding inflammatory changes. Spleen: Normal in size without focal abnormality. Adrenals/Urinary Tract: Adrenal glands are within normal limits. Kidneys show normal enhancement pattern. No renal calculi or urinary tract obstructive changes are noted. Bladder is well distended. Stomach/Bowel: Colon shows diffuse diverticular change without evidence of diverticulitis. Small bowel is within normal limits. Stomach is unremarkable. The appendix is well visualized centrally and demonstrates a normal enhancement pattern. Adjacent to the tip of the appendix however there is a focal air-fluid collection which measures at least 3.9 x 3.2 x 3.5 cm in greatest transverse, AP and craniocaudad projections. Surrounding inflammatory changes are noted adjacent to the collection. The adjacent small bowel appears mildly inflamed as well likely reactive in nature. Vascular/Lymphatic: Aortic atherosclerosis. No enlarged abdominal or pelvic lymph nodes. Reproductive: Uterus and bilateral adnexa are unremarkable. Mild changes of pelvic varices are noted. Other: No abdominal wall hernia or abnormality. No abdominopelvic ascites. Musculoskeletal: No acute or significant osseous findings. IMPRESSION: Air-fluid collection in the mid pelvis as described which lies adjacent to the tip of a otherwise normal appearing appendix. Given the longstanding symptomatology, this may represent a remote distal appendiceal perforation with associated abscess despite the fact that the appendix appears somewhat normal in appearance. Other consideration would be sigmoid diverticulitis although the sigmoid appears somewhat remote to the air-fluid collection. Additionally, the possibility of contained small bowel perforation with abscess  formation should be considered although felt to be less likely Critical Value/emergent results were called by telephone at the time of interpretation on 03/10/2020 at 5:40 pm to [facility] , who verbally acknowledged these results. Electronically Signed   By: Alcide Clever M.D.   On: 03/10/2020 17:47   Anti-infectives (From admission, onward)   Start     Dose/Rate Route Frequency Ordered Stop   03/10/20 2200  ceFEPIme (MAXIPIME) 2 g in sodium chloride 0.9 % 100 mL IVPB  Status:  Discontinued        2 g 200 mL/hr over 30 Minutes Intravenous Every 8 hours 03/10/20 1801 03/10/20 1802   03/10/20 2200  ceFEPIme (MAXIPIME) 2 g in sodium chloride 0.9 % 100 mL IVPB  Status:  Discontinued        2 g 200 mL/hr over 30  Minutes Intravenous Every 12 hours 03/10/20 1802 03/10/20 1803   03/10/20 1815  metroNIDAZOLE (FLAGYL) IVPB 500 mg        500 mg 100 mL/hr over 60 Minutes Intravenous Every 8 hours 03/10/20 1801     03/10/20 1815  ceFEPIme (MAXIPIME) 2 g in sodium chloride 0.9 % 100 mL IVPB        2 g 200 mL/hr over 30 Minutes Intravenous Every 12 hours 03/10/20 1803     03/10/20 1800  ciprofloxacin (CIPRO) IVPB 400 mg  Status:  Discontinued       "And" Linked Group Details   400 mg 200 mL/hr over 60 Minutes Intravenous  Once 03/10/20 1748 03/10/20 1930   03/10/20 1800  metroNIDAZOLE (FLAGYL) IVPB 500 mg  Status:  Discontinued       "And" Linked Group Details   500 mg 100 mL/hr over 60 Minutes Intravenous  Once 03/10/20 1748 03/10/20 1931      Assessment/Plan RLQ abdominal pain Intra-abdominal fluid collection of unclear etiology - could be secondary to remote distal appendiceal perforation with associated abscess versus sigmoid diverticulitis versus contained small bowel perforation with abscess formation - CT read with "Air-fluid collection in the mid pelvis as described which lies adjacent to the tip of a otherwise normal appearing appendix. Given the longstanding symptomatology, this may  represent a remote distal appendiceal perforation with associated abscess despite the fact that the appendix appears somewhat normal in appearance. Other consideration would be sigmoid diverticulitis although the sigmoid appears somewhat remote to the air-fluid collection. Additionally, the possibility of contained small bowel perforation with abscess formation should be considered although felt to be less likely".  - Discussed case with MD.  - No indication for emergency surgery - Will ask IR to see and determine if there is a window for drainage - Keep NPO - Cont IV abx. WBC normalized. Repeat labs in AM - Hopefully will improve during admission and be able to avoid surgery.   - If patient does improve with conservative management, would recommend a colonoscopy in 4 to 6 weeks. Her last colonoscopy was 8-10 years ago in [place].  She reports this showed polyps but was otherwise negative.  She denies history of diverticulitis.  - We will follow with you.  FEN - NPO, IVF VTE - SCDs, Lovenox ID - Cefepime/Flagyl  Jillyn Ledger, [facility] Surgery 03/11/2020, 11:28 AM Please see Amion for pager number during day hours 7:00am-4:30pm

## 2020-03-11 NOTE — Progress Notes (Signed)
Patients family would like to discuss plan in the morning prior to IR intervention. Daughter Rolan Bucco [phone]. Daughter stated she was unaware of change of plans and would like an update in am.

## 2020-03-11 NOTE — Progress Notes (Signed)
Patient is 87 year old female with history of rheumatoid arthritis, hypothyroidism, asthma who presented to the emergency department with complaints of lower abdominal pain.  On presentation she was afebrile, hemodynamically stable.  CT abdomen/pelvis showed air-fluid collection of 3.9 cm in the mid pelvis.  General surgery consulted. General surgery recommended IR for possible abdominal drain placement.  Keep n.p.o., continue IV antibiotics.  WBC has normalized. Patient seen and examined at the bedside this morning.  She remains comfortable.  Denies any abdominal pain, nausea or vomiting.  We will continue current management.  Patient seen by Dr. Myna Hidalgo this morning.  I agree with assessment and plan.

## 2020-03-12 LAB — BASIC METABOLIC PANEL
Anion gap: 13 (ref 5–15)
BUN: 18 mg/dL (ref 8–23)
CO2: 15 mmol/L — ABNORMAL LOW (ref 22–32)
Calcium: 7.4 mg/dL — ABNORMAL LOW (ref 8.9–10.3)
Chloride: 112 mmol/L — ABNORMAL HIGH (ref 98–111)
Creatinine, Ser: 0.7 mg/dL (ref 0.44–1.00)
GFR, Estimated: >60 mL/min (ref >60)
Glucose, Bld: 61 mg/dL — ABNORMAL LOW (ref 70–99)
Potassium: 3.5 mmol/L (ref 3.5–5.1)
Sodium: 140 mmol/L (ref 135–145)

## 2020-03-12 LAB — CBC
HCT: 30.4 % — ABNORMAL LOW (ref 36.0–46.0)
Hemoglobin: 10 g/dL — ABNORMAL LOW (ref 12.0–15.0)
MCH: 32.2 pg (ref 26.0–34.0)
MCHC: 32.9 g/dL (ref 30.0–36.0)
MCV: 97.7 fL (ref 80.0–100.0)
Platelets: 264 10*3/uL (ref 150–400)
RBC: 3.11 MIL/uL — ABNORMAL LOW (ref 3.87–5.11)
RDW: 12.8 % (ref 11.5–15.5)
WBC: 7.5 10*3/uL (ref 4.0–10.5)
nRBC: 0 % (ref 0.0–0.2)

## 2020-03-12 LAB — GLUCOSE, CAPILLARY
Glucose-Capillary: 57 mg/dL — ABNORMAL LOW (ref 70–99)
Glucose-Capillary: 68 mg/dL — ABNORMAL LOW (ref 70–99)
Glucose-Capillary: 101 mg/dL — ABNORMAL HIGH (ref 70–99)
Glucose-Capillary: 92 mg/dL (ref 70–99)

## 2020-03-12 MED ORDER — DEXTROSE-NACL 5-0.9 % IV SOLN: INTRAVENOUS | Status: DC

## 2020-03-12 MED ORDER — SODIUM CHLORIDE 0.9 % IV SOLN: INTRAVENOUS | Status: DC

## 2020-03-12 NOTE — Progress Notes (Signed)
Patient ID: Margaret [name], female   DOB: Oct 04, 1929, 87 y.o.   MRN: [id number] Request received for possible abdominal /pelvic fluid collection drainage in pt. Latest imaging studies were reviewed by Dr. Kathlene Cote.  There is currently no percutaneous window for drain placement and at this stage not sure if this actually represents an abscess at all.  Recommend follow-up CT with IV and oral contrast in a few days.

## 2020-03-12 NOTE — Progress Notes (Signed)
[facility] Progress Note     Subjective: CC:  Denies abd pain, fever, or chills - states her pain has resolved and she has very mild soreness that's improving.. Had one episode of emesis this AM a few minutes after taking her a pill. Denies nausea. Having some flatus. Denies BM - last BM was Monday (2 days ago). At home she was having daily BMs that were "normal" and she doesn't use stool softeners/miralax.   Family member at bedside. Objective: Vital signs in last 24 hours: Temp:  [97.5 F (36.4 C)-98.9 F (37.2 C)] 97.6 F (36.4 C) (01/19 0624) Pulse Rate:  [60-65] 60 (01/19 0624) Resp:  [14-16] 16 (01/19 0624) BP: (138-145)/(61-75) 138/61 (01/19 0624) SpO2:  [99 %-100 %] 100 % (01/19 0624) Last BM Date: 03/10/20  Intake/Output from previous day: 01/18 0701 - 01/19 0700 In: 1966.8 [I.V.:1453; IV Piggyback:513.8] Out: -  Intake/Output this shift: Total I/O In: 423.4 [I.V.:211.3; IV Piggyback:212.1] Out: -   PE: Gen:  Alert, NAD, pleasant Card:  Regular rate and rhythm, +murmur, pedal pulses 2+ BL Pulm:  Normal effort, clear to auscultation bilaterally Abd: Soft, non-tender, mild distention, bowel sounds present in all 4 quadrants Skin: warm and dry, no rashes  Psych: A&Ox3   Lab Results:  Recent Labs    03/11/20 0616 03/12/20 0330  WBC 7.9 7.5  HGB 10.1* 10.0*  HCT 29.8* 30.4*  PLT 277 264   BMET Recent Labs    03/11/20 0616 03/12/20 0330  NA 136 140  K 3.7 3.5  CL 104 112*  CO2 22 15*  GLUCOSE 98 61*  BUN 10 18  CREATININE 0.73 0.70  CALCIUM 8.0* 7.4*   PT/INR No results for input(s): LABPROT, INR in the last 72 hours. CMP     Component Value Date/Time   NA 140 03/12/2020 0330   NA 141 07/10/2019 0000   K 3.5 03/12/2020 0330   CL 112 (H) 03/12/2020 0330   CO2 15 (L) 03/12/2020 0330   GLUCOSE 61 (L) 03/12/2020 0330   BUN 18 03/12/2020 0330   BUN 17 07/10/2019 0000   CREATININE 0.70 03/12/2020 0330   CALCIUM 7.4 (L) 03/12/2020  0330   PROT 6.3 (L) 03/10/2020 1449   ALBUMIN 3.5 03/10/2020 1449   AST 40 03/10/2020 1449   ALT 32 03/10/2020 1449   ALKPHOS 124 03/10/2020 1449   BILITOT 0.8 03/10/2020 1449   GFRNONAA >60 03/12/2020 0330   GFRAA 86.84 07/10/2019 0000   Lipase     Component Value Date/Time   LIPASE 31 03/10/2020 1449       Studies/Results: CT ABDOMEN PELVIS W CONTRAST  Result Date: 03/10/2020 CLINICAL DATA:  Right lower quadrant pain for 1 month EXAM: CT ABDOMEN AND PELVIS WITH CONTRAST TECHNIQUE: Multidetector CT imaging of the abdomen and pelvis was performed using the standard protocol following bolus administration of intravenous contrast. CONTRAST:  100 mL OMNIPAQUE 300 COMPARISON:  None. FINDINGS: Lower chest: Mild atelectatic changes are noted in the bases bilaterally. Hepatobiliary: No focal liver abnormality is seen. No gallstones, gallbladder wall thickening, or biliary dilatation. Pancreas: Unremarkable. No pancreatic ductal dilatation or surrounding inflammatory changes. Spleen: Normal in size without focal abnormality. Adrenals/Urinary Tract: Adrenal glands are within normal limits. Kidneys show normal enhancement pattern. No renal calculi or urinary tract obstructive changes are noted. Bladder is well distended. Stomach/Bowel: Colon shows diffuse diverticular change without evidence of diverticulitis. Small bowel is within normal limits. Stomach is unremarkable. The appendix is well visualized centrally  and demonstrates a normal enhancement pattern. Adjacent to the tip of the appendix however there is a focal air-fluid collection which measures at least 3.9 x 3.2 x 3.5 cm in greatest transverse, AP and craniocaudad projections. Surrounding inflammatory changes are noted adjacent to the collection. The adjacent small bowel appears mildly inflamed as well likely reactive in nature. Vascular/Lymphatic: Aortic atherosclerosis. No enlarged abdominal or pelvic lymph nodes. Reproductive: Uterus and  bilateral adnexa are unremarkable. Mild changes of pelvic varices are noted. Other: No abdominal wall hernia or abnormality. No abdominopelvic ascites. Musculoskeletal: No acute or significant osseous findings. IMPRESSION: Air-fluid collection in the mid pelvis as described which lies adjacent to the tip of a otherwise normal appearing appendix. Given the longstanding symptomatology, this may represent a remote distal appendiceal perforation with associated abscess despite the fact that the appendix appears somewhat normal in appearance. Other consideration would be sigmoid diverticulitis although the sigmoid appears somewhat remote to the air-fluid collection. Additionally, the possibility of contained small bowel perforation with abscess formation should be considered although felt to be less likely Critical Value/emergent results were called by telephone at the time of interpretation on 03/10/2020 at 5:40 pm to [facility], [facility] , who verbally acknowledged these results. Electronically Signed   By: Inez Catalina M.D.   On: 03/10/2020 17:47    Anti-infectives: Anti-infectives (From admission, onward)   Start     Dose/Rate Route Frequency Ordered Stop   03/10/20 2200  ceFEPIme (MAXIPIME) 2 g in sodium chloride 0.9 % 100 mL IVPB  Status:  Discontinued        2 g 200 mL/hr over 30 Minutes Intravenous Every 8 hours 03/10/20 1801 03/10/20 1802   03/10/20 2200  ceFEPIme (MAXIPIME) 2 g in sodium chloride 0.9 % 100 mL IVPB  Status:  Discontinued        2 g 200 mL/hr over 30 Minutes Intravenous Every 12 hours 03/10/20 1802 03/10/20 1803   03/10/20 1815  metroNIDAZOLE (FLAGYL) IVPB 500 mg        500 mg 100 mL/hr over 60 Minutes Intravenous Every 8 hours 03/10/20 1801     03/10/20 1815  ceFEPIme (MAXIPIME) 2 g in sodium chloride 0.9 % 100 mL IVPB        2 g 200 mL/hr over 30 Minutes Intravenous Every 12 hours 03/10/20 1803     03/10/20 1800  ciprofloxacin (CIPRO) IVPB 400 mg  Status:  Discontinued        "And" Linked Group Details   400 mg 200 mL/hr over 60 Minutes Intravenous  Once 03/10/20 1748 03/10/20 1930   03/10/20 1800  metroNIDAZOLE (FLAGYL) IVPB 500 mg  Status:  Discontinued       "And" Linked Group Details   500 mg 100 mL/hr over 60 Minutes Intravenous  Once 03/10/20 1748 03/10/20 1931     Assessment/Plan RLQ abdominal pain Intra-abdominal fluid collection of unclear etiology - could be secondary to remote distal appendiceal perforation with associated abscess versus sigmoid diverticulitis versus contained small bowel perforation with abscess formation - CT read with "Air-fluid collection in the mid pelvis as described which lies adjacent to the tip of a otherwise normal appearing appendix. Given the longstanding symptomatology, this may represent a remote distal appendiceal perforation with associated abscess despite the fact that the appendix appears somewhat normal in appearance. Other consideration would be sigmoid diverticulitis although the sigmoid appears somewhat remote to the air-fluid collection. Additionally, the possibility of contained small bowel perforation with abscess formation should be considered  although felt to be less likely".  - Discussed case with MD.  - No indication for emergency surgery, no window for IR drainage - Cont IV abx. WBC normalized. - start CLD and monitor abd exams. PRN analgesics and anti-emetics. - Hopefully will improve during admission and be able to avoid surgery.   - If patient does improve with non-operative management, would recommend a colonoscopy in 4 to 6 weeks. Her last colonoscopy was 8-10 years ago in [place].  She reports this showed polyps but was otherwise negative.  She denies a known history of diverticulitis.  - We will follow with you.  FEN - CLD VTE - SCDs, Lovenox ID - Cefepime/Flagyl   LOS: 1 day    Obie Dredge, [facility] Please see Amion for pager number during day hours  7:00am-4:30pm

## 2020-03-12 NOTE — Progress Notes (Signed)
PROGRESS NOTE    Margaret [name]  [id number] DOB: 1929-03-25 DOA: 03/10/2020 PCP: Ma Hillock, DO   Chief Complain:Abd pain  Brief Narrative: Patient is 87 year old female with history of rheumatoid arthritis, hypothyroidism, asthma who presented to the emergency department with complaints of lower abdominal pain.  On presentation she was afebrile, hemodynamically stable.  CT abdomen/pelvis showed air-fluid collection of 3.9 cm in the mid pelvis.  General surgery consulted.  Started on conservative management.  Assessment & Plan:   Principal Problem:   Intra-abdominal abscess ([place]) Active Problems:   Hypothyroidism   Asthma   Seronegative rheumatoid arthritis ([place])   Hyponatremia   Intra-abdominal abscess: Presents with 99-month history of right lower quadrant pain which became more severe and constant.  Found to have 3.9 cm fluid collection near the tip of appendix with surrounding inflammatory changes.  Suspicion for remote distal appendiceal perforation with associated abscess versus sigmoid diverticulitis.  She had mild leukocytosis but no fever on presentation.  Started on IV antibiotics.  Follow-up blood cultures.  General surgery following.  Currently pain is well controlled, her abdomen is soft and nondistended. IR consulted for possible drainage but as per IR, there is a window for drainage.  Started on clear liquid diet.  Planning for conservative management with antibiotics.  Inflammatory arthritis: Currently stable.  Follows with rheumatology.  Hyponatremia: Currently stable.  Continue gentle IV fluids  Asthma: Currently stable, no cough or wheezing.  Continue Singulair.  Continue bronchodilators as needed  Hypothyroidism: Continue Synthyroid  Nutrition Problem: Inadequate oral intake Etiology: inability to eat      DVT prophylaxis:Lovenox Code Status: Full Family Communication: Daughter on phone on 03/12/2020 Status is: Inpatient  Remains inpatient  appropriate because:Inpatient level of care appropriate due to severity of illness   Dispo: The patient is from: Home              Anticipated d/c is to: Home              Anticipated d/c date is: 2 days              Patient currently is not medically stable to d/c.    Consultants: Surgery  Procedures:None  Antimicrobials:  Anti-infectives (From admission, onward)   Start     Dose/Rate Route Frequency Ordered Stop   03/10/20 2200  ceFEPIme (MAXIPIME) 2 g in sodium chloride 0.9 % 100 mL IVPB  Status:  Discontinued        2 g 200 mL/hr over 30 Minutes Intravenous Every 8 hours 03/10/20 1801 03/10/20 1802   03/10/20 2200  ceFEPIme (MAXIPIME) 2 g in sodium chloride 0.9 % 100 mL IVPB  Status:  Discontinued        2 g 200 mL/hr over 30 Minutes Intravenous Every 12 hours 03/10/20 1802 03/10/20 1803   03/10/20 1815  metroNIDAZOLE (FLAGYL) IVPB 500 mg        500 mg 100 mL/hr over 60 Minutes Intravenous Every 8 hours 03/10/20 1801     03/10/20 1815  ceFEPIme (MAXIPIME) 2 g in sodium chloride 0.9 % 100 mL IVPB        2 g 200 mL/hr over 30 Minutes Intravenous Every 12 hours 03/10/20 1803     03/10/20 1800  ciprofloxacin (CIPRO) IVPB 400 mg  Status:  Discontinued       "And" Linked Group Details   400 mg 200 mL/hr over 60 Minutes Intravenous  Once 03/10/20 1748 03/10/20 1930   03/10/20 1800  metroNIDAZOLE (FLAGYL)  IVPB 500 mg  Status:  Discontinued       "And" Linked Group Details   500 mg 100 mL/hr over 60 Minutes Intravenous  Once 03/10/20 1748 03/10/20 1931      Subjective: Patient seen and examined at bedside this morning.  Hemodynamically stable.  Comfortable.  She had an episode of vomiting after she took a pill.  But otherwise she appears comfortable.  She denies any worsening abdominal pain, nausea or persistent vomiting.  Abdomen is soft and distended . Objective: Vitals:   03/11/20 1021 03/11/20 1332 03/11/20 2151 03/12/20 0624  BP: 125/70 139/75 (!) 145/72 138/61  Pulse:  63 65 61 60  Resp: 16 14 16 16   Temp: 98.1 F (36.7 C) 98.9 F (37.2 C) (!) 97.5 F (36.4 C) 97.6 F (36.4 C)  TempSrc: Oral  Oral Oral  SpO2: 98% 100% 99% 100%  Weight:      Height:        Intake/Output Summary (Last 24 hours) at 03/12/2020 2130 Last data filed at 03/12/2020 0300 Gross per 24 hour  Intake 1966.78 ml  Output -  Net 1966.78 ml   Filed Weights   03/10/20 1425  Weight: 57.6 kg    Examination:  General exam: Appears calm and comfortable ,pleasant elderly female HEENT:PERRL,Oral mucosa moist, Ear/Nose normal on gross exam Respiratory system: Bilateral equal air entry, normal vesicular breath sounds, no wheezes or crackles  Cardiovascular system: S1 & S2 heard, RRR. [place] heard. No pedal edema. Gastrointestinal system: Abdomen is nondistended, soft , mild tenderness in the right lower quadrant. No organomegaly or masses felt. Normal bowel sounds heard. Central nervous system: Alert and oriented. No focal neurological deficits. Extremities: No edema, no clubbing ,no cyanosis Skin: No rashes, lesions or ulcers,no icterus ,no pallor   Data Reviewed: I have personally reviewed following labs and imaging studies  CBC: Recent Labs  Lab 03/10/20 1449 03/11/20 0616 03/12/20 0330  WBC 11.7* 7.9 7.5  NEUTROABS 9.5*  --   --   HGB 11.7* 10.1* 10.0*  HCT 33.6* 29.8* 30.4*  MCV 91.3 92.5 97.7  PLT 313 277 865   Basic Metabolic Panel: Recent Labs  Lab 03/10/20 1449 03/11/20 0616 03/12/20 0330  NA 131* 136 140  K 3.7 3.7 3.5  CL 97* 104 112*  CO2 24 22 15*  GLUCOSE 98 98 61*  BUN 10 10 18   CREATININE 0.63 0.73 0.70  CALCIUM 8.4* 8.0* 7.4*   GFR: Estimated Creatinine Clearance: 38.6 mL/min (by C-G formula based on SCr of 0.7 mg/dL). Liver Function Tests: Recent Labs  Lab 03/10/20 1449  AST 40  ALT 32  ALKPHOS 124  BILITOT 0.8  PROT 6.3*  ALBUMIN 3.5   Recent Labs  Lab 03/10/20 1449  LIPASE 31   No results for input(s): AMMONIA in the last  168 hours. Coagulation Profile: No results for input(s): INR, PROTIME in the last 168 hours. Cardiac Enzymes: No results for input(s): CKTOTAL, CKMB, CKMBINDEX, TROPONINI in the last 168 hours. BNP (last 3 results) No results for input(s): PROBNP in the last 8760 hours. HbA1C: No results for input(s): HGBA1C in the last 72 hours. CBG: No results for input(s): GLUCAP in the last 168 hours. Lipid Profile: No results for input(s): CHOL, HDL, LDLCALC, TRIG, CHOLHDL, LDLDIRECT in the last 72 hours. Thyroid Function Tests: No results for input(s): TSH, T4TOTAL, FREET4, T3FREE, THYROIDAB in the last 72 hours. Anemia Panel: No results for input(s): VITAMINB12, FOLATE, FERRITIN, TIBC, IRON, RETICCTPCT in the last  72 hours. Sepsis Labs: No results for input(s): PROCALCITON, LATICACIDVEN in the last 168 hours.  Recent Results (from the past 240 hour(s))  SARS CORONAVIRUS 2 (TAT 6-24 HRS) Nasopharyngeal Nasopharyngeal Swab     Status: None   Collection Time: 03/10/20  6:37 PM   Specimen: Nasopharyngeal Swab  Result Value Ref Range Status   SARS Coronavirus 2 NEGATIVE NEGATIVE Final    Comment: (NOTE) SARS-CoV-2 target nucleic acids are NOT DETECTED.  The SARS-CoV-2 RNA is generally detectable in upper and lower respiratory specimens during the acute phase of infection. Negative results do not preclude SARS-CoV-2 infection, do not rule out co-infections with other pathogens, and should not be used as the sole basis for treatment or other patient management decisions. Negative results must be combined with clinical observations, patient history, and epidemiological information. The expected result is Negative.  Fact Sheet for Patients: SugarRoll.be  Fact Sheet for Healthcare Providers: https://www.woods-mathews.com/  This test is not yet approved or cleared by the [place] FDA and  has been authorized for detection and/or diagnosis of  SARS-CoV-2 by FDA under an Emergency Use Authorization (EUA). This EUA will remain  in effect (meaning this test can be used) for the duration of the COVID-19 declaration under Se ction 564(b)(1) of the Act, 21 U.S.C. section 360bbb-3(b)(1), unless the authorization is terminated or revoked sooner.  Performed at [facility], [place] [address] [place], [place] [address]   Culture, blood (routine x 2)     Status: None (Preliminary result)   Collection Time: 03/11/20  9:04 AM   Specimen: Right Antecubital; Blood  Result Value Ref Range Status   Specimen Description   Final    RIGHT ANTECUBITAL Performed at [facility] [address], [place], [place] [address]    Special Requests   Final    BOTTLES DRAWN AEROBIC AND ANAEROBIC Blood Culture adequate volume Performed at [facility] [address] [place], [place] [address]    Culture   Final    NO GROWTH < 24 HOURS Performed at [facility] [address], [place], [place] [address]    Report Status PENDING  Incomplete  Culture, blood (routine x 2)     Status: None (Preliminary result)   Collection Time: 03/11/20  9:09 AM   Specimen: BLOOD RIGHT ARM  Result Value Ref Range Status   Specimen Description   Final    BLOOD RIGHT ARM Performed at [facility] [address], [place], [place] [address]    Special Requests   Final    BOTTLES DRAWN AEROBIC AND ANAEROBIC Blood Culture adequate volume Performed at [facility] [address], [place], [place] [address]    Culture   Final    NO GROWTH < 24 HOURS Performed at [facility] [address], [place], [place] [address]    Report Status PENDING  Incomplete         Radiology Studies: CT ABDOMEN PELVIS W CONTRAST  Result Date: 03/10/2020 CLINICAL DATA:  Right lower quadrant pain for 1 month EXAM: CT ABDOMEN AND PELVIS WITH CONTRAST TECHNIQUE: Multidetector CT imaging of  the abdomen and pelvis was performed using the standard protocol following bolus administration of intravenous contrast. CONTRAST:  100 mL OMNIPAQUE 300 COMPARISON:  None. FINDINGS: Lower chest: Mild atelectatic changes are noted in the bases bilaterally. Hepatobiliary: No focal liver abnormality is seen. No gallstones, gallbladder wall thickening, or biliary dilatation. Pancreas: Unremarkable.  No pancreatic ductal dilatation or surrounding inflammatory changes. Spleen: Normal in size without focal abnormality. Adrenals/Urinary Tract: Adrenal glands are within normal limits. Kidneys show normal enhancement pattern. No renal calculi or urinary tract obstructive changes are noted. Bladder is well distended. Stomach/Bowel: Colon shows diffuse diverticular change without evidence of diverticulitis. Small bowel is within normal limits. Stomach is unremarkable. The appendix is well visualized centrally and demonstrates a normal enhancement pattern. Adjacent to the tip of the appendix however there is a focal air-fluid collection which measures at least 3.9 x 3.2 x 3.5 cm in greatest transverse, AP and craniocaudad projections. Surrounding inflammatory changes are noted adjacent to the collection. The adjacent small bowel appears mildly inflamed as well likely reactive in nature. Vascular/Lymphatic: Aortic atherosclerosis. No enlarged abdominal or pelvic lymph nodes. Reproductive: Uterus and bilateral adnexa are unremarkable. Mild changes of pelvic varices are noted. Other: No abdominal wall hernia or abnormality. No abdominopelvic ascites. Musculoskeletal: No acute or significant osseous findings. IMPRESSION: Air-fluid collection in the mid pelvis as described which lies adjacent to the tip of a otherwise normal appearing appendix. Given the longstanding symptomatology, this may represent a remote distal appendiceal perforation with associated abscess despite the fact that the appendix appears somewhat normal in  appearance. Other consideration would be sigmoid diverticulitis although the sigmoid appears somewhat remote to the air-fluid collection. Additionally, the possibility of contained small bowel perforation with abscess formation should be considered although felt to be less likely Critical Value/emergent results were called by telephone at the time of interpretation on 03/10/2020 at 5:40 pm to [facility], [facility] , who verbally acknowledged these results. Electronically Signed   By: Inez Catalina M.D.   On: 03/10/2020 17:47        Scheduled Meds: . enoxaparin (LOVENOX) injection  40 mg Subcutaneous Q24H  . folic acid  1 mg Oral Daily  . levothyroxine  50 mcg Oral Q0600  . montelukast  10 mg Oral QHS   Continuous Infusions: . sodium chloride 500 mL (03/11/20 0252)  . ceFEPime (MAXIPIME) IV 2 g (03/12/20 0806)  . dextrose 5 % and 0.9% NaCl    . metronidazole 500 mg (03/12/20 0250)     LOS: 1 day    Time spent:25 mins. More than 50% of that time was spent in counseling and/or coordination of care.      Shelly Coss, MD [facility] P1/19/2022, 8:21 AM

## 2020-03-12 NOTE — Plan of Care (Signed)
  Problem: Education: Goal: Knowledge of General Education information will improve Description: Including pain rating scale, medication(s)/side effects and non-pharmacologic comfort measures Outcome: Progressing   Problem: Clinical Measurements: Goal: Ability to maintain clinical measurements within normal limits will improve Outcome: Progressing   Problem: Clinical Measurements: Goal: Diagnostic test results will improve Outcome: Progressing   

## 2020-03-13 LAB — GLUCOSE, CAPILLARY
Glucose-Capillary: 103 mg/dL — ABNORMAL HIGH (ref 70–99)
Glucose-Capillary: 108 mg/dL — ABNORMAL HIGH (ref 70–99)
Glucose-Capillary: 109 mg/dL — ABNORMAL HIGH (ref 70–99)
Glucose-Capillary: 110 mg/dL — ABNORMAL HIGH (ref 70–99)
Glucose-Capillary: 112 mg/dL — ABNORMAL HIGH (ref 70–99)
Glucose-Capillary: 124 mg/dL — ABNORMAL HIGH (ref 70–99)
Glucose-Capillary: 87 mg/dL (ref 70–99)

## 2020-03-13 LAB — BASIC METABOLIC PANEL
Anion gap: 8 (ref 5–15)
BUN: 11 mg/dL (ref 8–23)
CO2: 20 mmol/L — ABNORMAL LOW (ref 22–32)
Calcium: 7.4 mg/dL — ABNORMAL LOW (ref 8.9–10.3)
Chloride: 111 mmol/L (ref 98–111)
Creatinine, Ser: 0.69 mg/dL (ref 0.44–1.00)
GFR, Estimated: >60 mL/min (ref >60)
Glucose, Bld: 111 mg/dL — ABNORMAL HIGH (ref 70–99)
Potassium: 3.5 mmol/L (ref 3.5–5.1)
Sodium: 139 mmol/L (ref 135–145)

## 2020-03-13 LAB — CBC
HCT: 31.4 % — ABNORMAL LOW (ref 36.0–46.0)
Hemoglobin: 10.4 g/dL — ABNORMAL LOW (ref 12.0–15.0)
MCH: 31.4 pg (ref 26.0–34.0)
MCHC: 33.1 g/dL (ref 30.0–36.0)
MCV: 94.9 fL (ref 80.0–100.0)
Platelets: 279 10*3/uL (ref 150–400)
RBC: 3.31 MIL/uL — ABNORMAL LOW (ref 3.87–5.11)
RDW: 12.9 % (ref 11.5–15.5)
WBC: 8.1 10*3/uL (ref 4.0–10.5)
nRBC: 0 % (ref 0.0–0.2)

## 2020-03-13 MED ORDER — SODIUM CHLORIDE 0.9 % IV SOLN
2.0000 g | INTRAVENOUS | Status: DC
  Administered 2020-03-13: 2 g via INTRAVENOUS
  Filled 2020-03-13: qty 20

## 2020-03-13 MED ORDER — LACTASE 3000 UNITS PO TABS: 6000.0000 [IU] | ORAL_TABLET | Freq: Three times a day (TID) | ORAL | Status: DC

## 2020-03-13 MED ORDER — LACTASE 3000 UNITS PO TABS
6000.0000 [IU] | ORAL_TABLET | Freq: Three times a day (TID) | ORAL | Status: DC | PRN
  Filled 2020-03-13: qty 2

## 2020-03-13 MED ORDER — LACTASE 3000 UNITS PO TABS: 3000.0000 [IU] | ORAL_TABLET | Freq: Three times a day (TID) | ORAL | Status: DC | PRN

## 2020-03-13 MED ORDER — PREDNISONE 5 MG PO TABS
5.0000 mg | ORAL_TABLET | Freq: Every day | ORAL | Status: DC
  Administered 2020-03-14 – 2020-03-15 (×2): 5 mg via ORAL
  Filled 2020-03-13 (×2): qty 1

## 2020-03-13 NOTE — Plan of Care (Signed)
Plan of care reviewed and discussed with the patient. 

## 2020-03-13 NOTE — Progress Notes (Signed)
Subjective/Chief Complaint: Pain improved.  Tolerating clear liquids.  No nausea or vomiting. 1 bowel movement recorded  Objective: Vital signs in last 24 hours: Temp:  [97.5 F (36.4 C)-98.6 F (37 C)] 97.6 F (36.4 C) (01/20 0508) Pulse Rate:  [60-64] 64 (01/20 0508) Resp:  [15-17] 16 (01/20 0508) BP: (119-150)/(56-75) 150/75 (01/20 0508) SpO2:  [98 %-100 %] 99 % (01/20 0508) Last BM Date: 03/12/20  Intake/Output from previous day: 01/19 0701 - 01/20 0700 In: 2226.5 [P.O.:700; I.V.:1042.5; IV Piggyback:484] Out: -  Intake/Output this shift: No intake/output data recorded.   Gen:  Alert, NAD, pleasant Card:  Regular rate and rhythm, +murmur, pedal pulses 2+ BL Pulm:  Normal effort, clear to auscultation bilaterally Abd: Soft, non-tender, mild distention, bowel sounds present in all 4 quadrants Skin: warm and dry, no rashes  Psych: A&Ox3  Lab Results:  Recent Labs    03/12/20 0330 03/13/20 0338  WBC 7.5 8.1  HGB 10.0* 10.4*  HCT 30.4* 31.4*  PLT 264 279   BMET Recent Labs    03/12/20 0330 03/13/20 0338  NA 140 139  K 3.5 3.5  CL 112* 111  CO2 15* 20*  GLUCOSE 61* 111*  BUN 18 11  CREATININE 0.70 0.69  CALCIUM 7.4* 7.4*   PT/INR No results for input(s): LABPROT, INR in the last 72 hours. ABG No results for input(s): PHART, HCO3 in the last 72 hours.  Invalid input(s): PCO2, PO2  Studies/Results: No results found.  Anti-infectives: Anti-infectives (From admission, onward)   Start     Dose/Rate Route Frequency Ordered Stop   03/10/20 2200  ceFEPIme (MAXIPIME) 2 g in sodium chloride 0.9 % 100 mL IVPB  Status:  Discontinued        2 g 200 mL/hr over 30 Minutes Intravenous Every 8 hours 03/10/20 1801 03/10/20 1802   03/10/20 2200  ceFEPIme (MAXIPIME) 2 g in sodium chloride 0.9 % 100 mL IVPB  Status:  Discontinued        2 g 200 mL/hr over 30 Minutes Intravenous Every 12 hours 03/10/20 1802 03/10/20 1803   03/10/20 1815  metroNIDAZOLE (FLAGYL)  IVPB 500 mg        500 mg 100 mL/hr over 60 Minutes Intravenous Every 8 hours 03/10/20 1801     03/10/20 1815  ceFEPIme (MAXIPIME) 2 g in sodium chloride 0.9 % 100 mL IVPB        2 g 200 mL/hr over 30 Minutes Intravenous Every 12 hours 03/10/20 1803     03/10/20 1800  ciprofloxacin (CIPRO) IVPB 400 mg  Status:  Discontinued       "And" Linked Group Details   400 mg 200 mL/hr over 60 Minutes Intravenous  Once 03/10/20 1748 03/10/20 1930   03/10/20 1800  metroNIDAZOLE (FLAGYL) IVPB 500 mg  Status:  Discontinued       "And" Linked Group Details   500 mg 100 mL/hr over 60 Minutes Intravenous  Once 03/10/20 1748 03/10/20 1931      Assessment/Plan:  RLQ abdominal pain Intra-abdominal fluid collectionof unclear etiology -could besecondary to remote distal appendiceal perforation with associated abscess versus sigmoid diverticulitis versus contained small bowel perforation with abscess formation - CT read with "Air-fluid collection in the mid pelvis as described which lies adjacent to the tip of a otherwise normal appearing appendix. Given the longstanding symptomatology, this may represent a remote distal appendiceal perforation with associated abscess despite the fact that the appendix appears somewhat normal in appearance. Other consideration would be  sigmoid diverticulitis although the sigmoid appears somewhat remote to the air-fluid collection. Additionally, the possibility of contained small bowel perforation with abscess formation should be considered although felt to be less likely".   -No indication for emergency surgery, no window for IR drainage  - FLD  and monitor abd exams. PRN analgesics and anti-emetics. - improving  - If patient does improve with non-operative management, would recommend a colonoscopy in 4 to 6 weeks.Her last colonoscopy was 8-10 years ago in [place]. She reports this showed polyps but was otherwise negative. She denies a known history of diverticulitis. - We  will follow with you.  FEN - FLD VTE -SCDs, Lovenox ID -Cefepime/Flagyl - NEEDS TOTAL OF 10 - 14 DAYS  Can transition to po abx as she improves over the next 2 - 3 days   LOS: 2 days    Margaret [name] A Moksh Loomer 03/13/2020

## 2020-03-13 NOTE — Progress Notes (Signed)
Pharmacy Antibiotic Note  Margaret [name] is a 87 y.o. female admitted on 03/10/2020 with CT abdomen showing possible IAI.  Pharmacy has been consulted for Cefepime dosing.  Allergy to augmentin noted, will trial 4th gen cephalosporin with low cross-sensitivity.  03/13/2020  D#4 cefepime/Flagyl AF, WBC WNL, SCr WNL Improving per [facility] note   Plan: Continue Cefepime 2g IV every 12 hours Continue Flagyl 500 mg IV q8h per [facility]CS Monitor renal function, clinical progression and LOT F/u transition to PO abx  Height: 5' (152.4 cm) Weight: 57.6 kg (127 lb) IBW/kg (Calculated) : 45.5  Temp (24hrs), Avg:97.9 F (36.6 C), Min:97.5 F (36.4 C), Max:98.6 F (37 C)  Recent Labs  Lab 03/10/20 1449 03/11/20 0616 03/12/20 0330 03/13/20 0338  WBC 11.7* 7.9 7.5 8.1  CREATININE 0.63 0.73 0.70 0.69    Estimated Creatinine Clearance: 38.6 mL/min (by C-G formula based on SCr of 0.69 mg/dL).    Allergies  Allergen Reactions  . Augmentin [Amoxicillin-Pot Clavulanate] Swelling and Rash    Swelling in face   1/17 Cefepime >> 1/17 Flagyl >>  1/18 BCx2: ngtd 1/17 covid neg  Eudelia Bunch, Pharm.D 03/13/2020 9:11 AM

## 2020-03-13 NOTE — Plan of Care (Signed)
  Problem: Health Behavior/Discharge Planning: Goal: Ability to manage health-related needs will improve Outcome: Progressing   

## 2020-03-13 NOTE — Progress Notes (Signed)
PROGRESS NOTE    Margaret [name]  [id number] DOB: 07-May-1929 DOA: 03/10/2020 PCP: Ma Hillock, DO   Chief Complain:Abd pain  Brief Narrative: Patient is 87 year old female with history of rheumatoid arthritis, hypothyroidism, asthma who presented to the emergency department with complaints of lower abdominal pain.  On presentation she was afebrile, hemodynamically stable.  CT abdomen/pelvis showed air-fluid collection of 3.9 cm in the mid pelvis.  General surgery consulted.  Started on conservative management.  Overall picture is improving.  Assessment & Plan:   Principal Problem:   Intra-abdominal abscess ([place]) Active Problems:   Hypothyroidism   Asthma   Seronegative rheumatoid arthritis ([place])   Hyponatremia   Intra-abdominal abscess: Presents with 43-month history of right lower quadrant pain which became more severe and constant.  Found to have 3.9 cm fluid collection near the tip of appendix with surrounding inflammatory changes.  Suspicion for remote distal appendiceal perforation with associated abscess versus sigmoid diverticulitis.  She had mild leukocytosis but no fever on presentation.  Started on IV antibiotics.  Follow-up blood cultures.  General surgery following.  Currently pain is well controlled, her abdomen is soft and non distended and nontender IR consulted for possible drainage but as per IR, there is a window for drainage.  Started on full liquid diet.  Planning for conservative management with antibiotics. She will be discharged with oral antibiotics after surgical clearance.  We will aim for total of 3 to 4 weeks of oral antibiotics.  Since she is allergic to Augmentin, she might need Omnicef plus Flagyl ( for gram-negative and anaerobic coverage)on discharge.We dont have culture data so needs to treat empirically.  Inflammatory arthritis: Currently stable.On prednisone  Follows with rheumatology.  Hyponatremia: resolved.  On gentle IV fluids  Asthma:  Currently stable, no cough or wheezing.  Continue Singulair.  Continue bronchodilators as needed  Hypothyroidism: Continue Synthyroid  Nutrition Problem: Inadequate oral intake Etiology: inability to eat      DVT prophylaxis:Lovenox Code Status: Full Family Communication: Daughter on phone on 03/12/2020 Status is: Inpatient  Remains inpatient appropriate because:Inpatient level of care appropriate due to severity of illness   Dispo: The patient is from: Home              Anticipated d/c is to: Home              Anticipated d/c date is:1- 2 days              Patient currently is not medically stable to d/c.  Needs to have general surgery clearance before discharge sodium CKD  Consultants: Surgery  Procedures:None  Antimicrobials:  Anti-infectives (From admission, onward)   Start     Dose/Rate Route Frequency Ordered Stop   03/10/20 2200  ceFEPIme (MAXIPIME) 2 g in sodium chloride 0.9 % 100 mL IVPB  Status:  Discontinued        2 g 200 mL/hr over 30 Minutes Intravenous Every 8 hours 03/10/20 1801 03/10/20 1802   03/10/20 2200  ceFEPIme (MAXIPIME) 2 g in sodium chloride 0.9 % 100 mL IVPB  Status:  Discontinued        2 g 200 mL/hr over 30 Minutes Intravenous Every 12 hours 03/10/20 1802 03/10/20 1803   03/10/20 1815  metroNIDAZOLE (FLAGYL) IVPB 500 mg        500 mg 100 mL/hr over 60 Minutes Intravenous Every 8 hours 03/10/20 1801     03/10/20 1815  ceFEPIme (MAXIPIME) 2 g in sodium chloride 0.9 % 100 mL  IVPB        2 g 200 mL/hr over 30 Minutes Intravenous Every 12 hours 03/10/20 1803     03/10/20 1800  ciprofloxacin (CIPRO) IVPB 400 mg  Status:  Discontinued       "And" Linked Group Details   400 mg 200 mL/hr over 60 Minutes Intravenous  Once 03/10/20 1748 03/10/20 1930   03/10/20 1800  metroNIDAZOLE (FLAGYL) IVPB 500 mg  Status:  Discontinued       "And" Linked Group Details   500 mg 100 mL/hr over 60 Minutes Intravenous  Once 03/10/20 1748 03/10/20 1931       Subjective: Patient seen and examined at bedside this morning.  Hemodynamically stable.  She feels much better today.  Denies any abdominal pain.  Her abdomen is soft, nondistended and nontender.  She had a small bowel movement.  Eager to advance her diet. . Objective: Vitals:   03/12/20 0624 03/12/20 1250 03/12/20 1945 03/13/20 0508  BP: 138/61 (!) 119/56 (!) 141/69 (!) 150/75  Pulse: 60 60 61 64  Resp: 16 15 17 16   Temp: 97.6 F (36.4 C) (!) 97.5 F (36.4 C) 98.6 F (37 C) 97.6 F (36.4 C)  TempSrc: Oral Oral Oral Oral  SpO2: 100% 100% 98% 99%  Weight:      Height:        Intake/Output Summary (Last 24 hours) at 03/13/2020 1610 Last data filed at 03/13/2020 0600 Gross per 24 hour  Intake 2226.52 ml  Output -  Net 2226.52 ml   Filed Weights   03/10/20 1425  Weight: 57.6 kg    Examination:  General exam: Appears calm and comfortable , pleasant elderly female HEENT:PERRL,Oral mucosa moist, Ear/Nose normal on gross exam Respiratory system: Bilateral equal air entry, normal vesicular breath sounds, no wheezes or crackles  Cardiovascular system: S1 & S2 heard, RRR.  Pansystolic murmur gastrointestinal system: Abdomen is nondistended, soft and nontender. No organomegaly or masses felt. Normal bowel sounds heard. Central nervous system: Alert and oriented. No focal neurological deficits. Extremities: No edema, no clubbing ,no cyanosis Skin: No rashes, lesions or ulcers,no icterus ,no pallor    Data Reviewed: I have personally reviewed following labs and imaging studies  CBC: Recent Labs  Lab 03/10/20 1449 03/11/20 0616 03/12/20 0330 03/13/20 0338  WBC 11.7* 7.9 7.5 8.1  NEUTROABS 9.5*  --   --   --   HGB 11.7* 10.1* 10.0* 10.4*  HCT 33.6* 29.8* 30.4* 31.4*  MCV 91.3 92.5 97.7 94.9  PLT 313 277 264 960   Basic Metabolic Panel: Recent Labs  Lab 03/10/20 1449 03/11/20 0616 03/12/20 0330 03/13/20 0338  NA 131* 136 140 139  K 3.7 3.7 3.5 3.5  CL 97* 104  112* 111  CO2 24 22 15* 20*  GLUCOSE 98 98 61* 111*  BUN 10 10 18 11   CREATININE 0.63 0.73 0.70 0.69  CALCIUM 8.4* 8.0* 7.4* 7.4*   GFR: Estimated Creatinine Clearance: 38.6 mL/min (by C-G formula based on SCr of 0.69 mg/dL). Liver Function Tests: Recent Labs  Lab 03/10/20 1449  AST 40  ALT 32  ALKPHOS 124  BILITOT 0.8  PROT 6.3*  ALBUMIN 3.5   Recent Labs  Lab 03/10/20 1449  LIPASE 31   No results for input(s): AMMONIA in the last 168 hours. Coagulation Profile: No results for input(s): INR, PROTIME in the last 168 hours. Cardiac Enzymes: No results for input(s): CKTOTAL, CKMB, CKMBINDEX, TROPONINI in the last 168 hours. BNP (last 3 results)  No results for input(s): PROBNP in the last 8760 hours. HbA1C: No results for input(s): HGBA1C in the last 72 hours. CBG: Recent Labs  Lab 03/12/20 1620 03/12/20 1945 03/12/20 2359 03/13/20 0508 03/13/20 0750  GLUCAP 101* 92 87 110* 112*   Lipid Profile: No results for input(s): CHOL, HDL, LDLCALC, TRIG, CHOLHDL, LDLDIRECT in the last 72 hours. Thyroid Function Tests: No results for input(s): TSH, T4TOTAL, FREET4, T3FREE, THYROIDAB in the last 72 hours. Anemia Panel: No results for input(s): VITAMINB12, FOLATE, FERRITIN, TIBC, IRON, RETICCTPCT in the last 72 hours. Sepsis Labs: No results for input(s): PROCALCITON, LATICACIDVEN in the last 168 hours.  Recent Results (from the past 240 hour(s))  SARS CORONAVIRUS 2 (TAT 6-24 HRS) Nasopharyngeal Nasopharyngeal Swab     Status: None   Collection Time: 03/10/20  6:37 PM   Specimen: Nasopharyngeal Swab  Result Value Ref Range Status   SARS Coronavirus 2 NEGATIVE NEGATIVE Final    Comment: (NOTE) SARS-CoV-2 target nucleic acids are NOT DETECTED.  The SARS-CoV-2 RNA is generally detectable in upper and lower respiratory specimens during the acute phase of infection. Negative results do not preclude SARS-CoV-2 infection, do not rule out co-infections with other pathogens,  and should not be used as the sole basis for treatment or other patient management decisions. Negative results must be combined with clinical observations, patient history, and epidemiological information. The expected result is Negative.  Fact Sheet for Patients: SugarRoll.be  Fact Sheet for Healthcare Providers: https://www.woods-mathews.com/  This test is not yet approved or cleared by the [place] FDA and  has been authorized for detection and/or diagnosis of SARS-CoV-2 by FDA under an Emergency Use Authorization (EUA). This EUA will remain  in effect (meaning this test can be used) for the duration of the COVID-19 declaration under Se ction 564(b)(1) of the Act, 21 U.S.C. section 360bbb-3(b)(1), unless the authorization is terminated or revoked sooner.  Performed at [facility], [place] [address] [place], [place] [address]   Culture, blood (routine x 2)     Status: None (Preliminary result)   Collection Time: 03/11/20  9:04 AM   Specimen: Right Antecubital; Blood  Result Value Ref Range Status   Specimen Description   Final    RIGHT ANTECUBITAL Performed at [facility] [address], [place], [place] [address]    Special Requests   Final    BOTTLES DRAWN AEROBIC AND ANAEROBIC Blood Culture adequate volume Performed at [facility] [address], [place], [place] [address]    Culture   Final    NO GROWTH 2 DAYS Performed at [facility] [address] [place], [place] [address]    Report Status PENDING  Incomplete  Culture, blood (routine x 2)     Status: None (Preliminary result)   Collection Time: 03/11/20  9:09 AM   Specimen: BLOOD RIGHT ARM  Result Value Ref Range Status   Specimen Description   Final    BLOOD RIGHT ARM Performed at [facility] [address], [place], [place] [address]    Special Requests   Final    BOTTLES DRAWN AEROBIC  AND ANAEROBIC Blood Culture adequate volume Performed at [facility] [address] [place], [place] [address]    Culture   Final    NO GROWTH 2 DAYS Performed at [facility] [address], [place],  [address]    Report Status PENDING  Incomplete  Radiology Studies: No results found.      Scheduled Meds: . enoxaparin (LOVENOX) injection  40 mg Subcutaneous Q24H  . folic acid  1 mg Oral Daily  . levothyroxine  50 mcg Oral Q0600  . montelukast  10 mg Oral QHS   Continuous Infusions: . sodium chloride 500 mL (03/11/20 0252)  . ceFEPime (MAXIPIME) IV 2 g (03/12/20 2204)  . dextrose 5 % and 0.9% NaCl 75 mL/hr at 03/13/20 [id number]  . metronidazole 500 mg (03/13/20 0259)     LOS: 2 days    Time spent:25 mins. More than 50% of that time was spent in counseling and/or coordination of care.      Shelly Coss, MD [facility] P1/20/2022, 8:24 AM

## 2020-03-14 LAB — GLUCOSE, CAPILLARY
Glucose-Capillary: 101 mg/dL — ABNORMAL HIGH (ref 70–99)
Glucose-Capillary: 148 mg/dL — ABNORMAL HIGH (ref 70–99)
Glucose-Capillary: 173 mg/dL — ABNORMAL HIGH (ref 70–99)
Glucose-Capillary: 94 mg/dL (ref 70–99)
Glucose-Capillary: 96 mg/dL (ref 70–99)
Glucose-Capillary: 97 mg/dL (ref 70–99)

## 2020-03-14 MED ORDER — CIPROFLOXACIN HCL 500 MG PO TABS
500.0000 mg | ORAL_TABLET | Freq: Two times a day (BID) | ORAL | Status: DC
  Administered 2020-03-14 – 2020-03-15 (×3): 500 mg via ORAL
  Filled 2020-03-14 (×3): qty 1

## 2020-03-14 MED ORDER — METRONIDAZOLE 500 MG PO TABS
500.0000 mg | ORAL_TABLET | Freq: Three times a day (TID) | ORAL | Status: DC
  Administered 2020-03-14 – 2020-03-15 (×4): 500 mg via ORAL
  Filled 2020-03-14 (×4): qty 1

## 2020-03-14 NOTE — Care Management Important Message (Signed)
Important Message  Patient Details IM Letter placed in Patient's room. Name: Margaret [name] MRN: [id number] Date of Birth: [birth year]   Medicare Important Message Given:  Yes     Kerin Salen 03/14/2020, 11:15 AM

## 2020-03-14 NOTE — Plan of Care (Signed)
Plan of care reviewed and discussed with the patient. 

## 2020-03-14 NOTE — Evaluation (Signed)
Physical Therapy Evaluation Patient Details Name: Margaret [name] MRN: [id number] DOB: 02-Dec-1929 Today's Date: 03/14/2020   History of Present Illness  87 year old female with history of rheumatoid arthritis, hypothyroidism, asthma who presented to the emergency department with complaints of lower abdominal pain.CT abdomen/pelvis showed air-fluid collection of 3.9 cm in the mid pelvis.  General surgery consulted.  Started on conservative management.  Clinical Impression  Pt admitted with above diagnosis. Pt motivated to mobilize. amb ~ 250' with RW. Pt feels she may benefit from RW at d/c for  incr stability and safety. PT in agreement. Do not feel she will need f/u PT Pt currently with functional limitations due to the deficits listed below (see PT Problem List). Pt will benefit from skilled PT to increase their independence and safety with mobility to allow discharge to the venue listed below.       Follow Up Recommendations No PT follow up    Equipment Recommendations  Rolling walker with 5" wheels    Recommendations for Other Services       Precautions / Restrictions Precautions Precautions: Fall Restrictions Weight Bearing Restrictions: No      Mobility  Bed Mobility Overal bed mobility: Needs Assistance Bed Mobility: Supine to Sit;Sit to Supine     Supine to sit: Supervision Sit to supine: Supervision   General bed mobility comments: for safety    Transfers Overall transfer level: Needs assistance Equipment used: Rolling walker (2 wheeled) Transfers: Sit to/from Stand Sit to Stand: Min guard         General transfer comment: cues for hand placement  Ambulation/Gait Ambulation/Gait assistance: Min guard Gait Distance (Feet): 250 Feet Assistive device: Rolling walker (2 wheeled) Gait Pattern/deviations: Step-through pattern;Decreased stride length     General Gait Details: cues for RW position, steadying assist initially. stability improved with distance.  able to amb~6' without RW support  Stairs            Wheelchair Mobility    Modified Rankin (Stroke Patients Only)       Balance Overall balance assessment: Needs assistance Sitting-balance support: No upper extremity supported;Feet supported Sitting balance-Leahy Scale: Good       Standing balance-Leahy Scale: Fair                               Pertinent Vitals/Pain Pain Assessment: No/denies pain    Home Living Family/patient expects to be discharged to:: Private residence Living Arrangements: Children (dtr) Available Help at Discharge: Family Type of Home: House       Home Layout: Able to live on main level with bedroom/bathroom Home Equipment: None      Prior Function Level of Independence: Independent               Hand Dominance        Extremity/Trunk Assessment   Upper Extremity Assessment Upper Extremity Assessment: Overall WFL for tasks assessed    Lower Extremity Assessment Lower Extremity Assessment: Overall WFL for tasks assessed       Communication   Communication: No difficulties  Cognition Arousal/Alertness: Awake/alert Behavior During Therapy: WFL for tasks assessed/performed Overall Cognitive Status: Within Functional Limits for tasks assessed                                        General Comments      Exercises  Assessment/Plan    PT Assessment Patient needs continued PT services  PT Problem List Decreased strength;Decreased range of motion;Decreased activity tolerance;Decreased mobility;Decreased knowledge of use of DME       PT Treatment Interventions DME instruction;Therapeutic activities;Gait training;Functional mobility training;Therapeutic exercise;Patient/family education    PT Goals (Current goals can be found in the Care Plan section)  Acute Rehab PT Goals Patient Stated Goal: home PT Goal Formulation: With patient Time For Goal Achievement: 03/28/20 Potential to  Achieve Goals: Good    Frequency Min 3X/week   Barriers to discharge        Co-evaluation               AM-PAC PT "6 Clicks" Mobility  Outcome Measure Help needed turning from your back to your side while in a flat bed without using bedrails?: A Little Help needed moving from lying on your back to sitting on the side of a flat bed without using bedrails?: A Little Help needed moving to and from a bed to a chair (including a wheelchair)?: A Little Help needed standing up from a chair using your arms (e.g., wheelchair or bedside chair)?: A Little Help needed to walk in hospital room?: A Little Help needed climbing 3-5 steps with a railing? : A Little 6 Click Score: 18    End of Session   Activity Tolerance: Patient tolerated treatment well Patient left: in bed;with call bell/phone within reach;with bed alarm set Nurse Communication: Mobility status PT Visit Diagnosis: Difficulty in walking, not elsewhere classified (R26.2)    Time: 1610-9604 PT Time Calculation (min) (ACUTE ONLY): 15 min   Charges:   PT Evaluation $PT Eval Low Complexity: 1 Low          Jaeanna Mccomber, PT  Acute Rehab Dept ([facility]) [phone] Pager [phone]  03/14/2020   [facility] 03/14/2020, 11:34 AM

## 2020-03-14 NOTE — Progress Notes (Addendum)
Subjective/Chief Complaint: Denies pain. Tolerating diet. Does have mild intermittent nausea, no vomiting, and Early satiety. She had a BM this AM that she says was not painful or bloody. Mobilizing to the bathroom and brushing teeth independently.   Objective: Vital signs in last 24 hours: Temp:  [97.7 F (36.5 C)-97.8 F (36.6 C)] 97.7 F (36.5 C) (01/21 0528) Pulse Rate:  [58-66] 58 (01/21 0528) Resp:  [16-18] 16 (01/21 0528) BP: (139-171)/(72-95) 139/72 (01/21 0528) SpO2:  [98 %-100 %] 98 % (01/21 0528) Last BM Date: 03/13/20  Intake/Output from previous day: 01/20 0701 - 01/21 0700 In: 2191 [P.O.:360; I.V.:1631; IV Piggyback:200] Out: -  Intake/Output this shift: No intake/output data recorded.   Gen:  Alert, NAD, pleasant Card:  Regular rate and rhythm, +murmur, pedal pulses 2+ BL Pulm:  Normal effort, clear to auscultation bilaterally Abd: Soft, non-tender, mild distention, bowel sounds present in all 4 quadrants  Skin: warm and dry, no rashes  Psych: A&Ox3  Lab Results:  Recent Labs    03/12/20 0330 03/13/20 0338  WBC 7.5 8.1  HGB 10.0* 10.4*  HCT 30.4* 31.4*  PLT 264 279   BMET Recent Labs    03/12/20 0330 03/13/20 0338  NA 140 139  K 3.5 3.5  CL 112* 111  CO2 15* 20*  GLUCOSE 61* 111*  BUN 18 11  CREATININE 0.70 0.69  CALCIUM 7.4* 7.4*   PT/INR No results for input(s): LABPROT, INR in the last 72 hours. ABG No results for input(s): PHART, HCO3 in the last 72 hours.  Invalid input(s): PCO2, PO2  Studies/Results: No results found.  Anti-infectives: Anti-infectives (From admission, onward)   Start     Dose/Rate Route Frequency Ordered Stop   03/14/20 1200  metroNIDAZOLE (FLAGYL) tablet 500 mg        500 mg Oral Every 8 hours 03/14/20 0739     03/14/20 0830  ciprofloxacin (CIPRO) tablet 500 mg        500 mg Oral 2 times daily 03/14/20 0739     03/13/20 1330  cefTRIAXone (ROCEPHIN) 2 g in sodium chloride 0.9 % 100 mL IVPB  Status:   Discontinued        2 g 200 mL/hr over 30 Minutes Intravenous Every 24 hours 03/13/20 1237 03/14/20 0742   03/10/20 2200  ceFEPIme (MAXIPIME) 2 g in sodium chloride 0.9 % 100 mL IVPB  Status:  Discontinued        2 g 200 mL/hr over 30 Minutes Intravenous Every 8 hours 03/10/20 1801 03/10/20 1802   03/10/20 2200  ceFEPIme (MAXIPIME) 2 g in sodium chloride 0.9 % 100 mL IVPB  Status:  Discontinued        2 g 200 mL/hr over 30 Minutes Intravenous Every 12 hours 03/10/20 1802 03/10/20 1803   03/10/20 1815  metroNIDAZOLE (FLAGYL) IVPB 500 mg  Status:  Discontinued        500 mg 100 mL/hr over 60 Minutes Intravenous Every 8 hours 03/10/20 1801 03/14/20 0741   03/10/20 1815  ceFEPIme (MAXIPIME) 2 g in sodium chloride 0.9 % 100 mL IVPB  Status:  Discontinued        2 g 200 mL/hr over 30 Minutes Intravenous Every 12 hours 03/10/20 1803 03/13/20 1237   03/10/20 1800  ciprofloxacin (CIPRO) IVPB 400 mg  Status:  Discontinued       "And" Linked Group Details   400 mg 200 mL/hr over 60 Minutes Intravenous  Once 03/10/20 1748 03/10/20 1930  03/10/20 1800  metroNIDAZOLE (FLAGYL) IVPB 500 mg  Status:  Discontinued       "And" Linked Group Details   500 mg 100 mL/hr over 60 Minutes Intravenous  Once 03/10/20 1748 03/10/20 1931      Assessment/Plan:  RLQ abdominal pain Intra-abdominal fluid collectionof unclear etiology -could besecondary to remote distal appendiceal perforation with associated abscess versus sigmoid diverticulitis versus contained small bowel perforation with abscess formation - CT read with "Air-fluid collection in the mid pelvis as described which lies adjacent to the tip of a otherwise normal appearing appendix. Given the longstanding symptomatology, this may represent a remote distal appendiceal perforation with associated abscess despite the fact that the appendix appears somewhat normal in appearance. Other consideration would be sigmoid diverticulitis although the sigmoid  appears somewhat remote to the air-fluid collection. Additionally, the possibility of contained small bowel perforation with abscess formation should be considered although felt to be less likely".   -No indication for emergency surgery, no window for IR drainage, clinically improving. - advance to soft diet, PRN analgesics and anti-emetics. - improving  - would recommend a colonoscopy in 4 to 6 weeks.Her last colonoscopy was 8-10 years ago in [place]. She reports this showed polyps but was otherwise negative. She denies a known history of diverticulitis.  FEN - SOFT VTE -SCDs, Lovenox ID -Cefepime/Flagyl, transitioned to PO abx 1/20 (cipro/flagyl) NEEDS TOTAL OF 10 - 14 DAYS   Plan- advance to soft diet. Stable for discharge from surgical perspective. Discharge with 7 more days of abx. Pt is requesting a walker. F/U with GI for colonoscopy   LOS: 3 days    Jill Alexanders 03/14/2020

## 2020-03-14 NOTE — Progress Notes (Signed)
Progress Note    Margaret [name]  [id number] DOB: January 23, 1930  DOA: 03/10/2020 PCP: Ma Hillock, DO    Brief Narrative:   Chief complaint: Abdominal pain  Medical records reviewed and are as summarized below:  Margaret [name] is an 87 y.o. female with a PMH of rheumatoid arthritis, hypothyroidism, asthma who presented to the emergency department with complaints of lower abdominal pain. On presentation she was afebrile, hemodynamically stable. CT abdomen/pelvis showed air-fluid collection of 3.9 cm in the mid pelvis. General surgery consulted.  Started on conservative management.  Overall picture is improving.  Assessment/Plan:   Principle Problem: Intra-abdominal abscess Found to have 3.9 cm fluid collection near the tip of appendix with surrounding inflammatory changes.  Suspicion for remote distal appendiceal perforation with associated abscess versus sigmoid diverticulitis.  Placed on IV antibiotics. General surgery following but no operative indication. IR consulted for possible drainage but as per IR, there is no window for drainage. Continue conservative management with antibiotics. No  culture data so will need to treat empirically (currently on cipro/flagyl). Was nauseated after only eating a few spoonfuls of grits today.  Surgery advanced diet to soft, so will see how she tolerates in hopes of d/c home tomorrow.  Will need a colonoscopy in 4-6 weeks and 7 more days of antibiotics per surgery recommendations.   Active problems: Inflammatory arthritis Currently stable on prednisone  Follows with rheumatology.  Hyponatremia Resolved. On gentle IV fluids.  Asthma Currently stable, no cough or wheezing.  Continue Singulair.  Continue bronchodilators as needed  Hypothyroidism Continue Synthyroid.  Nutritional status Nutrition Problem: Inadequate oral intake Etiology: inability to eat Signs/Symptoms: NPO status Interventions: Refer to RD note for  recommendations  Body mass index is 24.8 kg/m.   Family Communication/Anticipated D/C date and plan/Code Status   DVT prophylaxis: enoxaparin (LOVENOX) injection 40 mg Start: 03/11/20 1000 Current Level of Care:: Level of care: Med-Surg Code Status: Full Code.  Family Communication: Called daughter, but got her answering machine. Disposition Plan: Status is: Inpatient  Remains inpatient appropriate because:Poor oral intake. Given nausea, ensure she can tolerate PO antibiotics prior to d/c.   Dispo: The patient is from: Home              Anticipated d/c is to: Home              Anticipated d/c date is: 1 day              Patient currently is not medically stable to d/c.   Medical Consultants:    Surgery   Anti-Infectives:    Cipro 03/14/20--->  Flagyl 03/14/20--->  Subjective:   Ate a few bites of grits and had nausea and abdominal pain afterward.  Feels weak. No SOB, occasional cough. Bowels moving.  Objective:    Vitals:   03/13/20 0508 03/13/20 1313 03/13/20 2116 03/14/20 0528  BP: (!) 150/75 (!) 171/95 (!) 155/73 139/72  Pulse: 64 65 66 (!) 58  Resp: 16 18 16 16   Temp: 97.6 F (36.4 C)  97.8 F (36.6 C) 97.7 F (36.5 C)  TempSrc: Oral     SpO2: 99% 100% 99% 98%  Weight:      Height:        Intake/Output Summary (Last 24 hours) at 03/14/2020 0821 Last data filed at 03/14/2020 0600 Gross per 24 hour  Intake 2070.99 ml  Output --  Net 2070.99 ml   Filed Weights   03/10/20 1425  Weight: 57.6 kg  Exam: General: No acute distress. Cardiovascular: Heart sounds show a regular rate, and rhythm. No gallops or rubs. IV/VE murmur consisted with aortic stenosis. No JVD. Lungs: Clear to auscultation bilaterally with good air movement. No rales, rhonchi or wheezes. Abdomen: Soft, nontender, nondistended with decreased bowel sounds. No masses. No hepatosplenomegaly. Neurological: Alert and oriented 3. Moves all extremities 4 with equal strength. Cranial  nerves II through XII grossly intact. Skin: Warm and dry. No rashes or lesions. Extremities: No clubbing or cyanosis. Venous stasis changed. No edema. Pedal pulses 2+. Psychiatric: Mood and affect are depressed. Insight and judgment are good.  Data Reviewed:   I have personally reviewed following labs and imaging studies:  Labs: Labs show the following:   Basic Metabolic Panel: Recent Labs  Lab 03/10/20 1449 03/11/20 0616 03/12/20 0330 03/13/20 0338  NA 131* 136 140 139  K 3.7 3.7 3.5 3.5  CL 97* 104 112* 111  CO2 24 22 15* 20*  GLUCOSE 98 98 61* 111*  BUN 10 10 18 11   CREATININE 0.63 0.73 0.70 0.69  CALCIUM 8.4* 8.0* 7.4* 7.4*   GFR Estimated Creatinine Clearance: 38.6 mL/min (by C-G formula based on SCr of 0.69 mg/dL). Liver Function Tests: Recent Labs  Lab 03/10/20 1449  AST 40  ALT 32  ALKPHOS 124  BILITOT 0.8  PROT 6.3*  ALBUMIN 3.5   Recent Labs  Lab 03/10/20 1449  LIPASE 31   CBC: Recent Labs  Lab 03/10/20 1449 03/11/20 0616 03/12/20 0330 03/13/20 0338  WBC 11.7* 7.9 7.5 8.1  NEUTROABS 9.5*  --   --   --   HGB 11.7* 10.1* 10.0* 10.4*  HCT 33.6* 29.8* 30.4* 31.4*  MCV 91.3 92.5 97.7 94.9  PLT 313 277 264 279   CBG: Recent Labs  Lab 03/13/20 1602 03/13/20 2003 03/13/20 2354 03/14/20 0351 03/14/20 0744  GLUCAP 109* 103* 124* 94 97    Microbiology Recent Results (from the past 240 hour(s))  SARS CORONAVIRUS 2 (TAT 6-24 HRS) Nasopharyngeal Nasopharyngeal Swab     Status: None   Collection Time: 03/10/20  6:37 PM   Specimen: Nasopharyngeal Swab  Result Value Ref Range Status   SARS Coronavirus 2 NEGATIVE NEGATIVE Final    Comment: (NOTE) SARS-CoV-2 target nucleic acids are NOT DETECTED.  The SARS-CoV-2 RNA is generally detectable in upper and lower respiratory specimens during the acute phase of infection. Negative results do not preclude SARS-CoV-2 infection, do not rule out co-infections with other pathogens, and should not be  used as the sole basis for treatment or other patient management decisions. Negative results must be combined with clinical observations, patient history, and epidemiological information. The expected result is Negative.  Fact Sheet for Patients: SugarRoll.be  Fact Sheet for Healthcare Providers: https://www.woods-mathews.com/  This test is not yet approved or cleared by the [place] FDA and  has been authorized for detection and/or diagnosis of SARS-CoV-2 by FDA under an Emergency Use Authorization (EUA). This EUA will remain  in effect (meaning this test can be used) for the duration of the COVID-19 declaration under Se ction 564(b)(1) of the Act, 21 U.S.C. section 360bbb-3(b)(1), unless the authorization is terminated or revoked sooner.  Performed at [facility], [place] [address] [place], [place] [address]   Culture, blood (routine x 2)     Status: None (Preliminary result)   Collection Time: 03/11/20  9:04 AM   Specimen: Right Antecubital; Blood  Result Value Ref Range Status   Specimen Description  Final    RIGHT ANTECUBITAL Performed at [facility], [place] [address], [place], [place] [address]    Special Requests   Final    BOTTLES DRAWN AEROBIC AND ANAEROBIC Blood Culture adequate volume Performed at [place] [address] [place], [place] [address]    Culture   Final    NO GROWTH 2 DAYS Performed at [facility] [address] [place], [place] [address]    Report Status PENDING  Incomplete  Culture, blood (routine x 2)     Status: None (Preliminary result)   Collection Time: 03/11/20  9:09 AM   Specimen: BLOOD RIGHT ARM  Result Value Ref Range Status   Specimen Description   Final    BLOOD RIGHT ARM Performed at [facility] [address], [place], [place] [address]    Special Requests   Final    BOTTLES DRAWN AEROBIC AND ANAEROBIC Blood  Culture adequate volume Performed at [facility] [address], [place], [place] [address]    Culture   Final    NO GROWTH 2 DAYS Performed at [facility] [address] [place],  [address]    Report Status PENDING  Incomplete    Procedures and diagnostic studies:  No results found.  Medications:    ciprofloxacin  500 mg Oral BID   enoxaparin (LOVENOX) injection  40 mg Subcutaneous V40G   folic acid  1 mg Oral Daily   levothyroxine  50 mcg Oral Q0600   metroNIDAZOLE  500 mg Oral Q8H   montelukast  10 mg Oral QHS   predniSONE  5 mg Oral Q breakfast   Continuous Infusions:  sodium chloride 500 mL (03/11/20 0252)   dextrose 5 % and 0.9% NaCl 50 mL/hr at 03/14/20 0716     LOS: 3 days   Jacquelynn Cree, MD  [facility]   [facility] How to contact the [facility] Attending or Consulting provider [place] or covering provider during after hours [place], for this patient?  1. Check the care team in [facility] and look for a) attending/consulting TRH provider listed and b) the [facility] team listed 2. Log into [email or web address] and use [place]'s universal password to access. If you do not have the password, please contact the hospital operator. 3. Locate the [facility] provider you are looking for under [facility] and page to a number that you can be directly reached. 4. If you still have difficulty reaching the provider, please page the [facility] (Director on Call) for the Hospitalists listed on amion for assistance.  03/14/2020, 8:21 AM

## 2020-03-15 LAB — GLUCOSE, CAPILLARY
Glucose-Capillary: 98 mg/dL (ref 70–99)
Glucose-Capillary: 109 mg/dL — ABNORMAL HIGH (ref 70–99)
Glucose-Capillary: 106 mg/dL — ABNORMAL HIGH (ref 70–99)

## 2020-03-15 MED ORDER — ONDANSETRON HCL 4 MG PO TABS: 4.0000 mg | ORAL_TABLET | Freq: Three times a day (TID) | ORAL | 0 refills | Status: DC | PRN

## 2020-03-15 MED ORDER — BOOST PLUS PO LIQD: 237.0000 mL | Freq: Three times a day (TID) | ORAL | 0 refills | Status: DC

## 2020-03-15 MED ORDER — METRONIDAZOLE 500 MG PO TABS: 500.0000 mg | ORAL_TABLET | Freq: Three times a day (TID) | ORAL | 0 refills | Status: AC

## 2020-03-15 MED ORDER — LACTASE 3000 UNITS PO TABS: 6000.0000 [IU] | ORAL_TABLET | Freq: Three times a day (TID) | ORAL | 0 refills | Status: DC | PRN

## 2020-03-15 MED ORDER — HYDROXYCHLOROQUINE SULFATE 200 MG PO TABS: 200.0000 mg | ORAL_TABLET | Freq: Every day | ORAL | Status: DC

## 2020-03-15 MED ORDER — BOOST PLUS PO LIQD
237.0000 mL | Freq: Three times a day (TID) | ORAL | Status: DC
  Filled 2020-03-15: qty 237

## 2020-03-15 MED ORDER — CIPROFLOXACIN HCL 500 MG PO TABS: 500.0000 mg | ORAL_TABLET | Freq: Two times a day (BID) | ORAL | 0 refills | Status: AC

## 2020-03-15 MED ORDER — TRAMADOL HCL 50 MG PO TABS: 50.0000 mg | ORAL_TABLET | Freq: Four times a day (QID) | ORAL | 0 refills | Status: DC | PRN

## 2020-03-15 MED ORDER — ENSURE ENLIVE PO LIQD: 237.0000 mL | Freq: Two times a day (BID) | ORAL | Status: DC

## 2020-03-15 NOTE — Progress Notes (Signed)
Pt stable at this time with family at bedside. No needs at time of d/c instructions and education. Rn will continue to monitor until d/c.

## 2020-03-15 NOTE — TOC Initial Note (Signed)
Transition of Care [facility]) - Initial/Assessment Note    Patient Details  Name: Margaret [name] MRN: [id number] Date of Birth: 1929-12-26  Transition of Care (TOC) CM/SW Contact:    Joaquin Courts, RN Phone Number: 03/15/2020, 1:42 PM  Clinical Narrative:                 Rolling walker ordered from [employer], rep [place] given referral.  Expected Discharge Plan: Home/Self Care Barriers to Discharge: No Barriers Identified   Patient Goals and CMS Choice Patient states their goals for this hospitalization and ongoing recovery are:: to go home      Expected Discharge Plan and Services Expected Discharge Plan: Home/Self Care   Discharge Planning Services: CM Consult   Living arrangements for the past 2 months: Single Family Home Expected Discharge Date: 03/15/20               DME Arranged: Gilford Rile rolling DME Agency: [employer] Date DME Agency Contacted: 03/15/20 Time DME Agency Contacted: [phone] Representative spoke with at DME Agency: [place]            Prior Living Arrangements/Services Living arrangements for the past 2 months: Single Family Home                     Activities of Daily Living Home Assistive Devices/Equipment: None ADL Screening (condition at time of admission) Patient's cognitive ability adequate to safely complete daily activities?: Yes Is the patient deaf or have difficulty hearing?: Yes Does the patient have difficulty seeing, even when wearing glasses/contacts?: No Does the patient have difficulty concentrating, remembering, or making decisions?: No Patient able to express need for assistance with ADLs?: Yes Does the patient have difficulty dressing or bathing?: No Independently performs ADLs?: Yes (appropriate for developmental age) Does the patient have difficulty walking or climbing stairs?: No Weakness of Legs: None Weakness of Arms/Hands: None  Permission Sought/Granted                  Emotional Assessment               Admission diagnosis:  Intra-abdominal abscess ([place]) [K65.1] Patient Active Problem List   Diagnosis Date Noted  . Hyponatremia 03/11/2020  . Intra-abdominal abscess ([place]) 03/10/2020  . Connective tissue disease ([place]) 02/27/2020  . Osteoporosis 02/27/2020  . Inflammatory arthritis 02/27/2020  . Seronegative rheumatoid arthritis ([place]) 08/02/2019  . Polymyalgia rheumatica ([place]) 08/02/2019  . ANA positive 05/08/2018  . Vitamin D deficiency   . PVD (peripheral vascular disease) ([place])   . Iron deficiency anemia   . Seasonal allergies 03/29/2018  . Nocturnal leg cramps 03/29/2018  . Myalgia 03/29/2018  . Hypothyroidism 03/29/2018  . Overweight (BMI 25.0-29.9) 03/29/2018  . Asthma   . Cervical radiculopathy 05/16/2015  . DDD (degenerative disc disease), lumbar 04/26/2015  . Carotid artery stenosis 10/17/2012  . Mitral valve prolapse 08/27/2011   PCP:  Ma Hillock, DO Pharmacy:   [facility], [place] - [address] [place]  [address] Phone: [phone] Fax: [phone]  [facility] [place], [place] [address] [place] [address] Phone: [phone] Fax: [phone]     Social Determinants of Health (SDOH) Interventions    Readmission Risk Interventions No flowsheet data found.

## 2020-03-15 NOTE — Progress Notes (Addendum)
Subjective/Chief Complaint: Pt feeling well and eager to go home.  Tolerated soft diet.  No n/v.  Passing gas and having BM.     Objective: Vital signs in last 24 hours: Temp:  [97.8 F (36.6 C)-98 F (36.7 C)] 97.8 F (36.6 C) (01/22 0511) Pulse Rate:  [64-72] 64 (01/22 0511) Resp:  [15-18] 15 (01/22 0511) BP: (131-151)/(71-90) 146/79 (01/22 0511) SpO2:  [97 %-99 %] 98 % (01/22 0511) Last BM Date: 03/13/20  Intake/Output from previous day: 01/21 0701 - 01/22 0700 In: 686.6 [P.O.:240; I.V.:144.2; IV Piggyback:302.4] Out: -  Intake/Output this shift: Total I/O In: 140 [P.O.:140] Out: -    Gen:  Alert, NAD, pleasant Card:  Regular rate,  pedal pulses 2+ BL Pulm:  Normal effort Abd: Soft, non-tender, non distended.    Skin: warm and dry, no rashes  Psych: A&Ox3   Lab Results:  Recent Labs    03/13/20 0338  WBC 8.1  HGB 10.4*  HCT 31.4*  PLT 279   BMET Recent Labs    03/13/20 0338  NA 139  K 3.5  CL 111  CO2 20*  GLUCOSE 111*  BUN 11  CREATININE 0.69  CALCIUM 7.4*   PT/INR No results for input(s): LABPROT, INR in the last 72 hours. ABG No results for input(s): PHART, HCO3 in the last 72 hours.  Invalid input(s): PCO2, PO2  Studies/Results: No results found.  Anti-infectives: Anti-infectives (From admission, onward)   Start     Dose/Rate Route Frequency Ordered Stop   03/21/20 0000  hydroxychloroquine (PLAQUENIL) 200 MG tablet        200 mg Oral Daily 03/15/20 1049     03/15/20 0000  ciprofloxacin (CIPRO) 500 MG tablet        500 mg Oral 2 times daily 03/15/20 1049 03/21/20 2359   03/15/20 0000  metroNIDAZOLE (FLAGYL) 500 MG tablet        500 mg Oral Every 8 hours 03/15/20 1049 03/21/20 2359   03/14/20 1200  metroNIDAZOLE (FLAGYL) tablet 500 mg        500 mg Oral Every 8 hours 03/14/20 0739     03/14/20 0830  ciprofloxacin (CIPRO) tablet 500 mg        500 mg Oral 2 times daily 03/14/20 0739     03/13/20 1330  cefTRIAXone (ROCEPHIN) 2 g in  sodium chloride 0.9 % 100 mL IVPB  Status:  Discontinued        2 g 200 mL/hr over 30 Minutes Intravenous Every 24 hours 03/13/20 1237 03/14/20 0742   03/10/20 2200  ceFEPIme (MAXIPIME) 2 g in sodium chloride 0.9 % 100 mL IVPB  Status:  Discontinued        2 g 200 mL/hr over 30 Minutes Intravenous Every 8 hours 03/10/20 1801 03/10/20 1802   03/10/20 2200  ceFEPIme (MAXIPIME) 2 g in sodium chloride 0.9 % 100 mL IVPB  Status:  Discontinued        2 g 200 mL/hr over 30 Minutes Intravenous Every 12 hours 03/10/20 1802 03/10/20 1803   03/10/20 1815  metroNIDAZOLE (FLAGYL) IVPB 500 mg  Status:  Discontinued        500 mg 100 mL/hr over 60 Minutes Intravenous Every 8 hours 03/10/20 1801 03/14/20 0741   03/10/20 1815  ceFEPIme (MAXIPIME) 2 g in sodium chloride 0.9 % 100 mL IVPB  Status:  Discontinued        2 g 200 mL/hr over 30 Minutes Intravenous Every 12 hours 03/10/20 1803  03/13/20 1237   03/10/20 1800  ciprofloxacin (CIPRO) IVPB 400 mg  Status:  Discontinued       "And" Linked Group Details   400 mg 200 mL/hr over 60 Minutes Intravenous  Once 03/10/20 1748 03/10/20 1930   03/10/20 1800  metroNIDAZOLE (FLAGYL) IVPB 500 mg  Status:  Discontinued       "And" Linked Group Details   500 mg 100 mL/hr over 60 Minutes Intravenous  Once 03/10/20 1748 03/10/20 1931      Assessment/Plan:  RLQ abdominal pain Intra-abdominal fluid collectionof unclear etiology -could besecondary to remote distal appendiceal perforation with associated abscess versus sigmoid diverticulitis versus contained small bowel perforation with abscess formation - CT read with "Air-fluid collection in the mid pelvis as described which lies adjacent to the tip of a otherwise normal appearing appendix. Given the longstanding symptomatology, this may represent a remote distal appendiceal perforation with associated abscess despite the fact that the appendix appears somewhat normal in appearance. Other consideration would be  sigmoid diverticulitis although the sigmoid appears somewhat remote to the air-fluid collection. Additionally, the possibility of contained small bowel perforation with abscess formation should be considered although felt to be less likely".   -No indication for emergency surgery, no window for IR drainage, clinically improving. - improving  - would recommend a colonoscopy in 4 to 6 weeks.Her last colonoscopy was 8-10 years ago in [place]. She reports this showed polyps but was otherwise negative. She denies a known history of diverticulitis.  FEN - SOFT VTE -SCDs, Lovenox ID -Cefepime/Flagyl, transitioned to PO abx 1/20 (cipro/flagyl) NEEDS TOTAL OF 10 - 14 DAYS   Plan- soft diet.  Stable for discharge from surgical perspective. Discharge with 7 more days of abx. Pt is requesting a walker. F/U with GI for colonoscopy   LOS: 4 days    Stark Klein 03/15/2020

## 2020-03-15 NOTE — Discharge Instructions (Signed)
Soft-Food Eating Plan A soft-food eating plan includes foods that are safe and easy to chew and swallow. Your health care provider or dietitian can help you find foods and flavors that fit into this plan. Follow this plan until your health care provider or dietitian says it is safe to start eating other foods and food textures. What are tips for following this plan? General guidelines  Take small bites of food, or cut food into pieces about  inch or smaller. Bite-sized pieces of food are easier to chew and swallow.  Eat moist foods. Avoid overly dry foods.  Avoid foods that: ? Are difficult to swallow, such as dry, chunky, crispy, or sticky foods. ? Are difficult to chew, such as hard, tough, or stringy foods. ? Contain nuts, seeds, or fruits.  Follow instructions from your dietitian about the types of liquids that are safe for you to swallow. You may be allowed to have: ? Thick liquids only. This includes only liquids that are thicker than honey. ? Thin and thick liquids. This includes all beverages and foods that become liquid at room temperature.  To make thick liquids: ? Purchase a commercial liquid thickening powder. These are available at grocery stores and pharmacies. ? Mix the thickener into liquids according to instructions on the label. ? Purchase ready-made thickened liquids. ? Thicken soup by pureeing, straining to remove chunks, and adding flour, potato flakes, or corn starch. ? Add commercial thickener to foods that become liquid at room temperature, such as milk shakes, yogurt, ice cream, gelatin, and sherbet.  Ask your health care provider whether you need to take a fiber supplement.   Cooking  Cook meats so they stay tender and moist. Use methods like braising, stewing, or baking in liquid.  Cook vegetables and fruit until they are soft enough to be mashed with a fork.  Peel soft, fresh fruits such as peaches, nectarines, and melons.  When making soup, make sure  chunks of meat and vegetables are smaller than  inch.  Reheat leftover foods slowly so that a tough crust does not form. What foods are allowed? The items listed below may not be a complete list. Talk with your dietitian about what dietary choices are best for you. Grains Breads, muffins, pancakes, or waffles moistened with syrup, jelly, or butter. Dry cereals well-moistened with milk. Moist, cooked cereals. Well-cooked pasta and rice. Vegetables All soft-cooked vegetables. Shredded lettuce. Fruits All canned and cooked fruits. Soft, peeled fresh fruits. Strawberries. Dairy Milk. Cream. Yogurt. Cottage cheese. Soft cheese without the rind. Meats and other protein foods Tender, moist ground meat, poultry, or fish. Meat cooked in gravy or sauces. Eggs. Sweets and desserts Ice cream. Milk shakes. Sherbet. Pudding. Fats and oils Butter. Margarine. Olive, canola, sunflower, and grapeseed oil. Smooth salad dressing. Smooth cream cheese. Mayonnaise. Gravy. What foods are not allowed? The items listed bemay not be a complete list. Talk with your dietitian about what dietary choices are best for you. Grains Coarse or dry cereals, such as bran, granola, and shredded wheat. Tough or chewy crusty breads, such as French bread or baguettes. Breads with nuts, seeds, or fruit. Vegetables All raw vegetables. Cooked corn. Cooked vegetables that are tough or stringy. Tough, crisp, fried potatoes and potato skins. Fruits Fresh fruits with skins or seeds, or both, such as apples, pears, and grapes. Stringy, high-pulp fruits, such as papaya, pineapple, coconut, and mango. Fruit leather and all dried fruit. Dairy Yogurt with nuts or coconut. Meats and other protein foods   Hard, dry sausages. Dry meat, poultry, or fish. Meats with gristle. Fish with bones. Fried meat or fish. Lunch meat and hotdogs. Nuts and seeds. Chunky peanut butter or other nut butters. Sweets and desserts Cakes or cookies that are very  dry or chewy. Desserts with dried fruit, nuts, or coconut. Fried pastries. Very rich pastries. Fats and oils Cream cheese with fruit or nuts. Salad dressings with seeds or chunks. Summary  A soft-food eating plan includes foods that are safe and easy to swallow. Generally, the foods should be soft enough to be mashed with a fork.  Avoid foods that are dry, hard to chew, crunchy, sticky, stringy, or crispy.  Ask your health care provider whether you need to thicken your liquids and if you need to take a fiber supplement. This information is not intended to replace advice given to you by your health care provider. Make sure you discuss any questions you have with your health care provider. Document Revised: 06/01/2018 Document Reviewed: 04/13/2016 Elsevier Patient Education  2021 Elsevier Inc.  

## 2020-03-15 NOTE — Plan of Care (Signed)
Pt to d/c home with family. No needs at this time.  

## 2020-03-15 NOTE — Discharge Summary (Signed)
[facility] Discharge Summary   Patient: Margaret [name] [id number]  PCP: Margaret Hillock, DO  Date of admission: 03/10/2020   Date of discharge:  03/15/2020     Discharge Diagnoses:  Principal Problem:   Intra-abdominal abscess ([place]) Active Problems:   Hypothyroidism   Asthma   Seronegative rheumatoid arthritis ([place])   Hyponatremia   Admitted From: Home Disposition:  Home with home health  Recommendations for Outpatient Follow-up:  1. PCP: Follow-up with PCP in 1 week 2. Follow up LABS/TEST: Needs GI referral for colonoscopy in 4 to 6 weeks   Follow-up Information    [name], [name], DO. Schedule an appointment as soon as possible for a visit in 1 week(s).   Specialty: Family Medicine Why: would recommend a colonoscopy in 4 to 6 weeks.  Contact information: [address] [place] [address] [phone]              Discharge Instructions    Ambulatory referral to Gastroenterology   Complete by: As directed    DIET SOFT   Complete by: As directed    Discharge instructions   Complete by: As directed    It is important that you read the instructions as well as go over your medication list with RN to help you understand your care after this hospitalization.  Please follow-up with PCP in 1-2 weeks.  Please note that we are unable to authorize any refills for discharge medications, once you are discharged. Thus, it is imperative that you return to your primary care physician (or establish a relationship with a primary care physician if you do not have one) for your care needs. So that they can reassess your need for medications and monitor your lab values.  Please request your primary care physician to go over all Hospital Tests and Procedure/Radiological results at the follow up. Please get all Hospital records sent to your PCP by signing hospital release before you go home.   Do not drive, operating heavy machinery, perform activities at heights, swimming or  participation in water activities or provide baby sitting services while you are on Pain, Sleep and Anxiety Medications; until you have been seen by Primary Care Physician and are cleared to do such activities.  Do not take more than prescribed Pain, Sleep and Anxiety Medications.  You were cared for by a hospitalist during your hospital stay. If you have any questions about your discharge medications or the care you received while you were in the hospital after you are discharged, you can call the hospital unit/floor you were admitted to and ask to speak with the hospitalist who took care of you. Ask for Hospitalist on call, if the hospitalist that took care of you is not available. Once you are discharged, your primary care physician will help you with any further medical issues. You Must read complete instructions/literature along with all the possible adverse reactions/side effects for all the Medicines you take and that have been prescribed to you. Take any new Medicines after you have completely understood and accept all the possible adverse reactions/side effects.   Increase activity slowly   Complete by: As directed       Diet recommendation: Soft diet  Activity: The patient is advised to gradually reintroduce usual activities, as tolerated  Discharge Condition: stable  Code Status: Full code   History of present illness: As per the H and P dictated on admission, "Margaret [name] is a 87 y.o. female with medical history significant for  inflammatory arthritis, hypothyroidism, asthma, and recent pneumonia, presenting to the emergency department for evaluation of lower abdominal pain.  Patient reports that she developed cramping discomfort in the right lower quadrant of her abdomen in early November 2021, experienced intermittent symptoms since then without any nausea, vomiting, or diarrhea, but then had worsening and more consistent pain beginning several days ago.  She has not noticed any  fevers or chills but does note that she has had some night sweats recently.  She was worried that this might be appendicitis, prompting her presentation to the ED."  Hospital Course:  Summary of her active problems in the hospital is as following.   Intra-abdominal abscess Found to have 3.9 cm fluid collection near the tip of appendix with surrounding inflammatory changes.  Suspicion for remote distal appendiceal perforation with associated abscess versus sigmoid diverticulitis.  Placed on IV antibiotics.  General surgery following but no operative indication.  IR consulted for possible drainage but as per IR, there is no window for drainage.  Continue conservative management with antibiotics. Tolerating Cipro Flagyl well. Also tolerating oral diet without any worsening abdominal pain. Will need a colonoscopy in 4-6 weeks and 7 more days of antibiotics per surgery recommendations.   Inflammatory arthritis Currently stable on as needed prednisoneFollows with rheumatology. Methotrexate is actually on hold per patient for a while. Also holding Plaquenil for now.  Hyponatremia Resolved.  Asthma Currently stable, no cough or wheezing. Continue Singulair. Continue bronchodilators as needed  Hypothyroidism Continue Synthyroid.  Patient was seen by physical therapy, who recommended No therapy needed on discharge. On the day of the discharge the patient's vitals were stable, and no other acute medical condition were reported by patient. The patient was felt safe to be discharge at Home with no therapy needed on discharge.  Consultants: General surgery, IR Procedures: None  DISCHARGE MEDICATION: Allergies as of 03/15/2020      Reactions   Augmentin [amoxicillin-pot Clavulanate] Swelling, Rash   Swelling in face   Lactose Intolerance (gi)       Medication List    STOP taking these medications   levofloxacin 750 MG tablet Commonly known as: Levaquin   methotrexate 2.5 MG  tablet Commonly known as: RHEUMATREX     TAKE these medications   albuterol 108 (90 Base) MCG/ACT inhaler Commonly known as: VENTOLIN HFA Inhale 2 puffs into the lungs every 6 (six) hours as needed for wheezing or shortness of breath.   benzonatate 100 MG capsule Commonly known as: TESSALON Take 1 capsule (100 mg total) by mouth 3 (three) times daily as needed for cough.   CALCIUM PO Take 1 tablet by mouth 2 (two) times daily. Slow release   ciprofloxacin 500 MG tablet Commonly known as: CIPRO Take 1 tablet (500 mg total) by mouth 2 (two) times daily for 6 days.   folic acid 0.5 MG tablet Commonly known as: FOLVITE Take 0.5 mg by mouth daily.   guaiFENesin 100 MG/5ML liquid Commonly known as: ROBITUSSIN Take 200 mg by mouth 3 (three) times daily as needed for cough.   hydrocortisone 1 % ointment Apply 1 application topically 4 (four) times daily as needed for itching (dry skin).   hydroxychloroquine 200 MG tablet Commonly known as: PLAQUENIL Take 1 tablet (200 mg total) by mouth daily. Start on 1/28 Start taking on: March 21, 2020 What changed:   additional instructions  These instructions start on March 21, 2020. If you are unsure what to do until then, ask your doctor or  other care provider.   lactase 3000 units tablet Commonly known as: LACTAID Take 2 tablets (6,000 Units total) by mouth 3 (three) times daily as needed (lactose intolerance).   lactose free nutrition Liqd Take 237 mLs by mouth 3 (three) times daily with meals.   levothyroxine 50 MCG tablet Commonly known as: SYNTHROID Take 1 tablet (50 mcg total) by mouth daily before breakfast.   metroNIDAZOLE 500 MG tablet Commonly known as: FLAGYL Take 1 tablet (500 mg total) by mouth every 8 (eight) hours for 6 days.   montelukast 10 MG tablet Commonly known as: SINGULAIR Take 1 tablet (10 mg total) by mouth at bedtime.   ondansetron 4 MG tablet Commonly known as: Zofran Take 1 tablet (4 mg  total) by mouth every 8 (eight) hours as needed for nausea or vomiting.   polyvinyl alcohol 1.4 % ophthalmic solution Commonly known as: LIQUIFILM TEARS Place 1 drop into both eyes as needed for dry eyes.   pramipexole 0.25 MG tablet Commonly known as: MIRAPEX TK 1 T PO 1 HOUR BEFORE BEDTIME What changed:   how much to take  how to take this  when to take this  additional instructions   predniSONE 5 MG tablet Commonly known as: DELTASONE Take 5 mg by mouth daily as needed (joint/muscle pain).   PreserVision AREDS 2 Caps Take 1 capsule by mouth 2 (two) times daily.   traMADol 50 MG tablet Commonly known as: Ultram Take 1 tablet (50 mg total) by mouth every 6 (six) hours as needed for up to 6 days.   Vitamin D (Ergocalciferol) 1.25 MG (50000 UNIT) Caps capsule Commonly known as: DRISDOL Take 50,000 Units by mouth every 7 (seven) days.            Durable Medical Equipment  (From admission, onward)         Start     Ordered   03/15/20 1230  For home use only DME Walker rolling  Once       Question Answer Comment  Walker: With [place]   Patient needs a walker to treat with the following condition Physical deconditioning      03/15/20 1229          Discharge Exam: Filed Weights   03/10/20 1425  Weight: 57.6 kg   Vitals:   03/15/20 0511 03/15/20 1300  BP: (!) 146/79 119/64  Pulse: 64 74  Resp: 15 16  Temp: 97.8 F (36.6 C) 97.7 F (36.5 C)  SpO2: 98% 98%   General: Appear in no distress, no Rash; Oral Mucosa Clear, moist. no Abnormal Neck Mass Or lumps, Conjunctiva normal  Cardiovascular: S1 and S2 Present, aortic systolic  Murmur Respiratory: good respiratory effort, Bilateral Air entry present and CTA, no Crackles, no wheezes Abdomen: Bowel Sound present, Soft and mild tenderness Extremities: no Pedal edema Neurology: alert and oriented to time, place, and person affect appropriate. no new focal deficit  The results of significant  diagnostics from this hospitalization (including imaging, microbiology, ancillary and laboratory) are listed below for reference.    Significant Diagnostic Studies: DG Chest 2 View  Result Date: 02/27/2020 CLINICAL DATA:  Cough and shortness of breath. EXAM: CHEST - 2 VIEW COMPARISON:  None. FINDINGS: Mild to moderate severity diffuse chronic appearing increased interstitial lung markings are seen. Very mild areas of focal scarring, atelectasis and/or early infiltrate are seen within the right apex and medial aspects of the bilateral upper lobes. There is no evidence of a pleural effusion  or pneumothorax. The heart size and mediastinal contours are within normal limits. There is mild calcification of the aortic arch. Degenerative changes seen throughout the thoracic spine. IMPRESSION: Chronic appearing increased interstitial lung markings with mild areas of bilateral upper lobe scarring, atelectasis and/or early infiltrate. Electronically Signed   By: Virgina Norfolk M.D.   On: 02/27/2020 19:39   CT ABDOMEN PELVIS W CONTRAST  Result Date: 03/10/2020 CLINICAL DATA:  Right lower quadrant pain for 1 month EXAM: CT ABDOMEN AND PELVIS WITH CONTRAST TECHNIQUE: Multidetector CT imaging of the abdomen and pelvis was performed using the standard protocol following bolus administration of intravenous contrast. CONTRAST:  100 mL OMNIPAQUE 300 COMPARISON:  None. FINDINGS: Lower chest: Mild atelectatic changes are noted in the bases bilaterally. Hepatobiliary: No focal liver abnormality is seen. No gallstones, gallbladder wall thickening, or biliary dilatation. Pancreas: Unremarkable. No pancreatic ductal dilatation or surrounding inflammatory changes. Spleen: Normal in size without focal abnormality. Adrenals/Urinary Tract: Adrenal glands are within normal limits. Kidneys show normal enhancement pattern. No renal calculi or urinary tract obstructive changes are noted. Bladder is well distended. Stomach/Bowel: Colon  shows diffuse diverticular change without evidence of diverticulitis. Small bowel is within normal limits. Stomach is unremarkable. The appendix is well visualized centrally and demonstrates a normal enhancement pattern. Adjacent to the tip of the appendix however there is a focal air-fluid collection which measures at least 3.9 x 3.2 x 3.5 cm in greatest transverse, AP and craniocaudad projections. Surrounding inflammatory changes are noted adjacent to the collection. The adjacent small bowel appears mildly inflamed as well likely reactive in nature. Vascular/Lymphatic: Aortic atherosclerosis. No enlarged abdominal or pelvic lymph nodes. Reproductive: Uterus and bilateral adnexa are unremarkable. Mild changes of pelvic varices are noted. Other: No abdominal wall hernia or abnormality. No abdominopelvic ascites. Musculoskeletal: No acute or significant osseous findings. IMPRESSION: Air-fluid collection in the mid pelvis as described which lies adjacent to the tip of a otherwise normal appearing appendix. Given the longstanding symptomatology, this may represent a remote distal appendiceal perforation with associated abscess despite the fact that the appendix appears somewhat normal in appearance. Other consideration would be sigmoid diverticulitis although the sigmoid appears somewhat remote to the air-fluid collection. Additionally, the possibility of contained small bowel perforation with abscess formation should be considered although felt to be less likely Critical Value/emergent results were called by telephone at the time of interpretation on 03/10/2020 at 5:40 pm to [facility], [place], [place] , who verbally acknowledged these results. Electronically Signed   By: Inez Catalina M.D.   On: 03/10/2020 17:47    Microbiology: Recent Results (from the past 240 hour(s))  SARS CORONAVIRUS 2 (TAT 6-24 HRS) Nasopharyngeal Nasopharyngeal Swab     Status: None   Collection Time: 03/10/20  6:37 PM   Specimen: Nasopharyngeal  Swab  Result Value Ref Range Status   SARS Coronavirus 2 NEGATIVE NEGATIVE Final    Comment: (NOTE) SARS-CoV-2 target nucleic acids are NOT DETECTED.  The SARS-CoV-2 RNA is generally detectable in upper and lower respiratory specimens during the acute phase of infection. Negative results do not preclude SARS-CoV-2 infection, do not rule out co-infections with other pathogens, and should not be used as the sole basis for treatment or other patient management decisions. Negative results must be combined with clinical observations, patient history, and epidemiological information. The expected result is Negative.  Fact Sheet for Patients: SugarRoll.be  Fact Sheet for Healthcare Providers: https://www.woods-mathews.com/  This test is not yet approved or cleared by the [place]  FDA and  has been authorized for detection and/or diagnosis of SARS-CoV-2 by FDA under an Emergency Use Authorization (EUA). This EUA will remain  in effect (meaning this test can be used) for the duration of the COVID-19 declaration under Se ction 564(b)(1) of the Act, 21 U.S.C. section 360bbb-3(b)(1), unless the authorization is terminated or revoked sooner.  Performed at [facility], [place] [address] [place], [place] [address]   Culture, blood (routine x 2)     Status: None (Preliminary result)   Collection Time: 03/11/20  9:04 AM   Specimen: Right Antecubital; Blood  Result Value Ref Range Status   Specimen Description   Final    RIGHT ANTECUBITAL Performed at [facility] [address], [place], [place] [address]    Special Requests   Final    BOTTLES DRAWN AEROBIC AND ANAEROBIC Blood Culture adequate volume Performed at [facility] [address], [place], [place] [address]    Culture   Final    NO GROWTH 3 DAYS Performed at [facility], [place] [address] [place], [place] [address]    Report Status  PENDING  Incomplete  Culture, blood (routine x 2)     Status: None (Preliminary result)   Collection Time: 03/11/20  9:09 AM   Specimen: BLOOD RIGHT ARM  Result Value Ref Range Status   Specimen Description   Final    BLOOD RIGHT ARM Performed at [facility] [address], [place], [place] [address]    Special Requests   Final    BOTTLES DRAWN AEROBIC AND ANAEROBIC Blood Culture adequate volume Performed at [facility] [address], [place], [place] [address]    Culture   Final    NO GROWTH 3 DAYS Performed at [facility], [place] [address] [place], [place] [address]    Report Status PENDING  Incomplete     Labs: CBC: Recent Labs  Lab 03/10/20 1449 03/11/20 0616 03/12/20 0330 03/13/20 0338  WBC 11.7* 7.9 7.5 8.1  NEUTROABS 9.5*  --   --   --   HGB 11.7* 10.1* 10.0* 10.4*  HCT 33.6* 29.8* 30.4* 31.4*  MCV 91.3 92.5 97.7 94.9  PLT 313 277 264 [id number]   Basic Metabolic Panel: Recent Labs  Lab 03/10/20 1449 03/11/20 0616 03/12/20 0330 03/13/20 0338  NA 131* 136 140 139  K 3.7 3.7 3.5 3.5  CL 97* 104 112* 111  CO2 24 22 15* 20*  GLUCOSE 98 98 61* 111*  BUN 10 10 18 11   CREATININE 0.63 0.73 0.70 0.69  CALCIUM 8.4* 8.0* 7.4* 7.4*   Liver Function Tests: Recent Labs  Lab 03/10/20 1449  AST 40  ALT 32  ALKPHOS 124  BILITOT 0.8  PROT 6.3*  ALBUMIN 3.5   CBG: Recent Labs  Lab 03/14/20 1954 03/14/20 2355 03/15/20 0352 03/15/20 0737 03/15/20 1054  GLUCAP 148* 96 98 109* 106*    Time spent: 35 minutes  Signed:  Berle Mull  [facility]  03/15/2020 2:05 PM

## 2020-03-16 LAB — CULTURE, BLOOD (ROUTINE X 2)
Culture: NO GROWTH
Culture: NO GROWTH
Special Requests: ADEQUATE
Special Requests: ADEQUATE

## 2020-03-17 ENCOUNTER — Telehealth: Payer: Self-pay

## 2020-03-17 NOTE — Telephone Encounter (Signed)
Transition Care Management Follow-up Telephone Call  Date of discharge and from where: 03/15/2020-[place]  How have you been since you were released from the hospital? Stronger than I thought I would be.  Any questions or concerns? No  Items Reviewed:  Did the pt receive and understand the discharge instructions provided? Yes   Medications obtained and verified? Yes   Other? Yes   Any new allergies since your discharge? No   Dietary orders reviewed? Yes  Do you have support at home? Yes   Home Care and Equipment/Supplies: Were home health services ordered? no If so, what is the name of the agency? n/a  Has the agency set up a time to come to the patient's home? n/a Were any new equipment or medical supplies ordered?  No What is the name of the medical supply agency? n/a Were you able to get the supplies/equipment? not applicable Do you have any questions related to the use of the equipment or supplies? n/a  Functional Questionnaire: (I = Independent and D = Dependent) ADLs: I with assistance  Bathing/Dressing- I  Meal Prep- D  Eating- I  Maintaining continence- I  Transferring/Ambulation- I  Managing Meds- I with assistance  Follow up appointments reviewed:   PCP Hospital f/u appt confirmed? Yes  Scheduled to see Dr. Raoul Pitch on 03/19/20 @ 11:30.  [facility] f/u appt confirmed? N/A   Are transportation arrangements needed? No   If their condition worsens, is the pt aware to call PCP or go to the Emergency Dept.? Yes  Was the patient provided with contact information for the PCP's office or ED? Yes  Was to pt encouraged to call back with questions or concerns? Yes

## 2020-03-18 DIAGNOSIS — M1990 Unspecified osteoarthritis, unspecified site: Secondary | ICD-10-CM | POA: Insufficient documentation

## 2020-03-19 ENCOUNTER — Other Ambulatory Visit: Payer: Self-pay

## 2020-03-19 ENCOUNTER — Encounter: Payer: Self-pay | Admitting: Family Medicine

## 2020-03-19 ENCOUNTER — Ambulatory Visit: Payer: Medicare HMO | Admitting: Family Medicine

## 2020-03-19 VITALS — BP 126/71 | HR 69 | Temp 97.6°F | Ht 60.0 in | Wt 131.0 lb

## 2020-03-19 DIAGNOSIS — K651 Peritoneal abscess: Secondary | ICD-10-CM | POA: Diagnosis not present

## 2020-03-19 DIAGNOSIS — Z9289 Personal history of other medical treatment: Secondary | ICD-10-CM

## 2020-03-19 LAB — COMPREHENSIVE METABOLIC PANEL
ALT: 33 U/L (ref 0–35)
AST: 33 U/L (ref 0–37)
Albumin: 3.9 g/dL (ref 3.5–5.2)
Alkaline Phosphatase: 80 U/L (ref 39–117)
BUN: 12 mg/dL (ref 6–23)
CO2: 32 mEq/L (ref 19–32)
Calcium: 10 mg/dL (ref 8.4–10.5)
Chloride: 102 mEq/L (ref 96–112)
Creatinine, Ser: 0.75 mg/dL (ref 0.40–1.20)
GFR: 70.99 mL/min (ref >60.00)
Glucose, Bld: 95 mg/dL (ref 70–99)
Potassium: 3.9 mEq/L (ref 3.5–5.1)
Sodium: 141 mEq/L (ref 135–145)
Total Bilirubin: 0.5 mg/dL (ref 0.2–1.2)
Total Protein: 6.2 g/dL (ref 6.0–8.3)

## 2020-03-19 LAB — CBC WITH DIFFERENTIAL/PLATELET
Basophils Absolute: 0 10*3/uL (ref 0.0–0.1)
Basophils Relative: 0.5 % (ref 0.0–3.0)
Eosinophils Absolute: 0.3 10*3/uL (ref 0.0–0.7)
Eosinophils Relative: 3.8 % (ref 0.0–5.0)
HCT: 37.3 % (ref 36.0–46.0)
Hemoglobin: 12.8 g/dL (ref 12.0–15.0)
Lymphocytes Relative: 8.6 % — ABNORMAL LOW (ref 12.0–46.0)
Lymphs Abs: 0.7 10*3/uL (ref 0.7–4.0)
MCHC: 34.2 g/dL (ref 30.0–36.0)
MCV: 90.7 fl (ref 78.0–100.0)
Monocytes Absolute: 0.8 10*3/uL (ref 0.1–1.0)
Monocytes Relative: 9.6 % (ref 3.0–12.0)
Neutro Abs: 6.5 10*3/uL (ref 1.4–7.7)
Neutrophils Relative %: 77.5 % — ABNORMAL HIGH (ref 43.0–77.0)
Platelets: 355 10*3/uL (ref 150.0–400.0)
RBC: 4.11 Mil/uL (ref 3.87–5.11)
RDW: 13.7 % (ref 11.5–15.5)
WBC: 8.4 10*3/uL (ref 4.0–10.5)

## 2020-03-19 NOTE — Patient Instructions (Signed)
More soft diet.  We have referred you to gastroenterology- they will call you to schedule.      Soft-Food Eating Plan A soft-food eating plan includes foods that are safe and easy to chew and swallow. Your health care provider or dietitian can help you find foods and flavors that fit into this plan. Follow this plan until your health care provider or dietitian says it is safe to start eating other foods and food textures. What are tips for following this plan? General guidelines  Take small bites of food, or cut food into pieces about  inch or smaller. Bite-sized pieces of food are easier to chew and swallow.  Eat moist foods. Avoid overly dry foods.  Avoid foods that: ? Are difficult to swallow, such as dry, chunky, crispy, or sticky foods. ? Are difficult to chew, such as hard, tough, or stringy foods. ? Contain nuts, seeds, or fruits.  Follow instructions from your dietitian about the types of liquids that are safe for you to swallow. You may be allowed to have: ? Thick liquids only. This includes only liquids that are thicker than honey. ? Thin and thick liquids. This includes all beverages and foods that become liquid at room temperature.  To make thick liquids: ? Purchase a commercial liquid thickening powder. These are available at grocery stores and pharmacies. ? Mix the thickener into liquids according to instructions on the label. ? Purchase ready-made thickened liquids. ? Thicken soup by pureeing, straining to remove chunks, and adding flour, potato flakes, or corn starch. ? Add commercial thickener to foods that become liquid at room temperature, such as milk shakes, yogurt, ice cream, gelatin, and sherbet.  Ask your health care provider whether you need to take a fiber supplement.   Cooking  Cook meats so they stay tender and moist. Use methods like braising, stewing, or baking in liquid.  Cook vegetables and fruit until they are soft enough to be mashed with a  fork.  Peel soft, fresh fruits such as peaches, nectarines, and melons.  When making soup, make sure chunks of meat and vegetables are smaller than  inch.  Reheat leftover foods slowly so that a tough crust does not form. What foods are allowed? The items listed below may not be a complete list. Talk with your dietitian about what dietary choices are best for you. Grains Breads, muffins, pancakes, or waffles moistened with syrup, jelly, or butter. Dry cereals well-moistened with milk. Moist, cooked cereals. Well-cooked pasta and rice. Vegetables All soft-cooked vegetables. Shredded lettuce. Fruits All canned and cooked fruits. Soft, peeled fresh fruits. Strawberries. Dairy Milk. Cream. Yogurt. Cottage cheese. Soft cheese without the rind. Meats and other protein foods Tender, moist ground meat, poultry, or fish. Meat cooked in gravy or sauces. Eggs. Sweets and desserts Ice cream. Milk shakes. Sherbet. Pudding. Fats and oils Butter. Margarine. Olive, canola, sunflower, and grapeseed oil. Smooth salad dressing. Smooth cream cheese. Mayonnaise. Gravy. What foods are not allowed? The items listed bemay not be a complete list. Talk with your dietitian about what dietary choices are best for you. Grains Coarse or dry cereals, such as bran, granola, and shredded wheat. Tough or chewy crusty breads, such as [place] bread or baguettes. Breads with nuts, seeds, or fruit. Vegetables All raw vegetables. Cooked corn. Cooked vegetables that are tough or stringy. Tough, crisp, fried potatoes and potato skins. Fruits Fresh fruits with skins or seeds, or both, such as apples, pears, and grapes. Stringy, high-pulp fruits, such as papaya,  pineapple, coconut, and mango. Fruit leather and all dried fruit. Dairy Yogurt with nuts or coconut. Meats and other protein foods Hard, dry sausages. Dry meat, poultry, or fish. Meats with gristle. Fish with bones. Fried meat or fish. Lunch meat and hotdogs. Nuts  and seeds. Chunky peanut butter or other nut butters. Sweets and desserts Cakes or cookies that are very dry or chewy. Desserts with dried fruit, nuts, or coconut. Fried pastries. Very rich pastries. Fats and oils Cream cheese with fruit or nuts. Salad dressings with seeds or chunks. Summary  A soft-food eating plan includes foods that are safe and easy to swallow. Generally, the foods should be soft enough to be mashed with a fork.  Avoid foods that are dry, hard to chew, crunchy, sticky, stringy, or crispy.  Ask your health care provider whether you need to thicken your liquids and if you need to take a fiber supplement. This information is not intended to replace advice given to you by your health care provider. Make sure you discuss any questions you have with your health care provider. Document Revised: 06/01/2018 Document Reviewed: 04/13/2016 Elsevier Patient Education  2021 [employer].

## 2020-03-19 NOTE — Progress Notes (Signed)
Margaret [name] , January 21, 1930, 87 y.o., female MRN: [id number] Patient Care Team    Relationship Specialty Notifications Start End  Ma Hillock, DO PCP - General Family Medicine  03/08/19   Bernarda Caffey, MD Consulting Physician Ophthalmology  03/29/18   Konrad Felix, MD Consulting Physician Ophthalmology  03/29/18   Devra Dopp, MD Referring Physician Dermatology  08/02/19   Valinda Party, MD  Rheumatology  08/02/19     Chief Complaint  Patient presents with  . Hospitalization Follow-up     Subjective:  Margaret [name]  is a 87 y.o. female presents for hospital follow up after recent admission on/17/2022 for primary diagnosis intra-abdominal abscess.  Patient was discharged on 03/15/2020 to home. Patients discharge summary has been reviewed, as well as all labs/image studies obtained during hospitalization.   Patients hospital course: Patient has been seen by this provider 02/27/2020 with cough, body aches, chills and upper respiratory symptoms.  She had a negative Covid test 02/26/2020.  Chest x-ray was completed which showed possible infiltrate and patient was treated with Levaquin 750 mg daily x7 days for pneumonia.  Patient's respiratory symptoms had resolved however she started to have increasingly more abdominal discomfort.  SHe had had intermittent abdominal discomfort as the end of November that had resolved.  Patient was encouraged to seek emergent care in the ED which she underwent CT abdomen showing abdominal abscess near appendix tip.  Etiology abdominal abscess is uncertain whether it is from the appendix or possibly diverticular.  IR had been consulted for possible drainage, however it was felt there was no window for drainage and conservative management with antibiotics was recommended.  She was treated with Cipro Flagyl, pain control and clear liquid diet.  Upon hospital discharge her white blood cell had returned to normal.  Anemia was present, along with  hypocalcemia.  Patient was encouraged to follow-up within 7 days of possible discharge from admitting team. Since hospital discharge patient reports she is feeling better but still sore.  She is present with her daughter today who also gives part of the history.  She is still not eating much in the way of nutrition.  She is attempting to drink the protein drinks at least 1-2 times a day.  She is following a clear liquid/soft diet.  She reports she becomes full very easily.  She denies nausea or vomit.  She states she is having some diarrhea-although she is really only consuming broths at this time.  She denies any fevers or chills since hospital discharge.  She has continued to take the Cipro twice daily.  They do admit Flagyl is more difficult for her to take, and she has missed a few doses and averaging twice daily dosing.  She reports she does feel weak, but is gaining some strength and easing back into her daily routine.  Her daughter has been watching over her closely.  Recent Labs  Lab 03/13/20 0338  HGB 10.4*  HCT 31.4*  WBC 8.1  PLT 279   CMP Latest Ref Rng & Units 03/13/2020 03/12/2020 03/11/2020  Glucose 70 - 99 mg/dL 111(H) 61(L) 98  BUN 8 - 23 mg/dL _0 Creatinine 0.44 - 1.00 mg/dL 0.69 0.70 0.73  Sodium 135 - 145 mmol/L 139 140 136  Potassium 3.5 - 5.1 mmol/L 3.5 3.5 3.7  Chloride 98 - 111 mmol/L 111 112(H) 104  CO2 22 - 32 mmol/L 20(L) 15(L) 22  Calcium 8.9 - 10.3 mg/dL 7.4(L)  7.4(L) 8.0(L)  Total Protein 6.5 - 8.1 g/dL - - -  Total Bilirubin 0.3 - 1.2 mg/dL - - -  Alkaline Phos 38 - 126 U/L - - -  AST 15 - 41 U/L - - -  ALT 0 - 44 U/L - - -      DG Chest 2 View  Result Date: 02/27/2020 CLINICAL DATA:  Cough and shortness of breath. EXAM: CHEST - 2 VIEW COMPARISON:  None. FINDINGS: Mild to moderate severity diffuse chronic appearing increased interstitial lung markings are seen. Very mild areas of focal scarring, atelectasis and/or early infiltrate are seen within the  right apex and medial aspects of the bilateral upper lobes. There is no evidence of a pleural effusion or pneumothorax. The heart size and mediastinal contours are within normal limits. There is mild calcification of the aortic arch. Degenerative changes seen throughout the thoracic spine. IMPRESSION: Chronic appearing increased interstitial lung markings with mild areas of bilateral upper lobe scarring, atelectasis and/or early infiltrate. Electronically Signed   By: Virgina Norfolk M.D.   On: 02/27/2020 19:39   CT ABDOMEN PELVIS W CONTRAST  Result Date: 03/10/2020 CLINICAL DATA:  Right lower quadrant pain for 1 month EXAM: CT ABDOMEN AND PELVIS WITH CONTRAST TECHNIQUE: Multidetector CT imaging of the abdomen and pelvis was performed using the standard protocol following bolus administration of intravenous contrast. CONTRAST:  100 mL OMNIPAQUE 300 COMPARISON:  None. FINDINGS: Lower chest: Mild atelectatic changes are noted in the bases bilaterally. Hepatobiliary: No focal liver abnormality is seen. No gallstones, gallbladder wall thickening, or biliary dilatation. Pancreas: Unremarkable. No pancreatic ductal dilatation or surrounding inflammatory changes. Spleen: Normal in size without focal abnormality. Adrenals/Urinary Tract: Adrenal glands are within normal limits. Kidneys show normal enhancement pattern. No renal calculi or urinary tract obstructive changes are noted. Bladder is well distended. Stomach/Bowel: Colon shows diffuse diverticular change without evidence of diverticulitis. Small bowel is within normal limits. Stomach is unremarkable. The appendix is well visualized centrally and demonstrates a normal enhancement pattern. Adjacent to the tip of the appendix however there is a focal air-fluid collection which measures at least 3.9 x 3.2 x 3.5 cm in greatest transverse, AP and craniocaudad projections. Surrounding inflammatory changes are noted adjacent to the collection. The adjacent small bowel  appears mildly inflamed as well likely reactive in nature. Vascular/Lymphatic: Aortic atherosclerosis. No enlarged abdominal or pelvic lymph nodes. Reproductive: Uterus and bilateral adnexa are unremarkable. Mild changes of pelvic varices are noted. Other: No abdominal wall hernia or abnormality. No abdominopelvic ascites. Musculoskeletal: No acute or significant osseous findings. IMPRESSION: Air-fluid collection in the mid pelvis as described which lies adjacent to the tip of a otherwise normal appearing appendix. Given the longstanding symptomatology, this may represent a remote distal appendiceal perforation with associated abscess despite the fact that the appendix appears somewhat normal in appearance. Other consideration would be sigmoid diverticulitis although the sigmoid appears somewhat remote to the air-fluid collection. Additionally, the possibility of contained small bowel perforation with abscess formation should be considered although felt to be less likely Critical Value/emergent results were called by telephone at the time of interpretation on 03/10/2020 at 5:40 pm to [facility] , who verbally acknowledged these results. Electronically Signed   By: Inez Catalina M.D.   On: 03/10/2020 17:47     Depression screen [facility] [address] 2/9 03/19/2020 03/29/2018  Decreased Interest - 0  Down, Depressed, Hopeless 0 0  PHQ - 2 Score 0 0    Allergies  Allergen Reactions  .  Augmentin [Amoxicillin-Pot Clavulanate] Swelling and Rash    Swelling in face  . Lactose Intolerance (Gi)    Social History   Tobacco Use  . Smoking status: Former Smoker    Packs/day: 0.50    Types: Cigarettes    Start date: 37    Quit date: 1965    Years since quitting: 57.1  . Smokeless tobacco: Never Used  Substance Use Topics  . Alcohol use: Yes    Alcohol/week: 1.0 standard drink    Types: 1 Glasses of wine per week    Comment: 2-3 times weekly    Past Medical History:  Diagnosis Date  . Allergy   . Arthritis    . Arthritis of left knee   . Asthma   . Carotid artery stenosis 10/17/2012   stable report in 2018- "moderate on right, minimal on left"  . Carpal tunnel syndrome 2018   left.  . Cervical radiculopathy 05/16/2015   DDD C4-C5, C5-C6, and C6-C7.  Facet arthropathy throughout cervical spine.  Normal foraminal narrowing secondary to uncovertebral  arthropathy is seen bilaterally at C3-C4 and C5/C6  . Chicken pox   . DDD (degenerative disc disease), lumbar 04/26/2015   Mild levoscoliosis, right anterior listhesis of L4 and L5.  DDD at every level.  Facet arthropathy at multiple levels.  . Deviated septum   . Glaucoma    both eyes, mild opened angle  . Heart murmur   . History of colon polyps   . History of fall   . History of frequent urinary tract infections    klebs- pansensitive. c. freundii - resitant to cefazolin and augementin  . Hypothyroidism   . Iron deficiency anemia    prior pcp told her to take Fe 325 QD  . Macular degeneration    h/o retinal edema  . Mitral valve prolapse 08/27/2011  . Murmur, cardiac 10/17/2012  . Osteoporosis   . 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 Pseudophakia of both eyes   . PVD (peripheral vascular disease) ([place])   . Rheumatic fever    87 years old   . Syncope 05/17/2017  . Thyroid disease   . Urinary incontinence   . Vertigo    had been prescribed antivert  . Vitamin D deficiency    Past Surgical History:  Procedure Laterality Date  . CARPAL TUNNEL RELEASE Bilateral 2019  . CATARACT EXTRACTION  1997  . colonoscopy   2010  . NASAL SEPTUM SURGERY  2007  . WRIST FRACTURE SURGERY Right 1996   Family History  Problem Relation Age of Onset  . Arthritis Mother   . COPD Mother   . Prostate cancer Father   . Arthritis Father   . Hearing loss Father   . Heart disease Father   . Heart attack Father   . Blindness Maternal Grandmother   . Stroke Maternal Grandmother   . Liver cancer Sister   . Arthritis Brother   . Prostate cancer  Brother   . COPD Brother   . Heart disease Brother   . Breast cancer Daughter   . COPD Paternal Grandmother   . Breast cancer Paternal Grandmother    Allergies as of 03/19/2020      Reactions   Augmentin [amoxicillin-pot Clavulanate] Swelling, Rash   Swelling in face   Lactose Intolerance (gi)       Medication List       Accurate as of March 19, 2020 11:33 AM. If you have any questions,  ask your nurse or doctor.        STOP taking these medications   benzonatate 100 MG capsule Commonly known as: TESSALON Stopped by: Howard Pouch, DO   guaiFENesin 100 MG/5ML liquid Commonly known as: ROBITUSSIN Stopped by: Howard Pouch, DO   hydrocortisone 1 % ointment Stopped by: Howard Pouch, DO   ondansetron 4 MG tablet Commonly known as: Zofran Stopped by: Howard Pouch, DO   traMADol 50 MG tablet Commonly known as: Ultram Stopped by: Howard Pouch, DO     TAKE these medications   albuterol 108 (90 Base) MCG/ACT inhaler Commonly known as: VENTOLIN HFA Inhale 2 puffs into the lungs every 6 (six) hours as needed for wheezing or shortness of breath.   CALCIUM PO Take 1 tablet by mouth 2 (two) times daily. Slow release   ciprofloxacin 500 MG tablet Commonly known as: CIPRO Take 1 tablet (500 mg total) by mouth 2 (two) times daily for 6 days.   folic acid 0.5 MG tablet Commonly known as: FOLVITE Take 0.5 mg by mouth daily.   hydroxychloroquine 200 MG tablet Commonly known as: PLAQUENIL Take 1 tablet (200 mg total) by mouth daily. Start on 1/28 Start taking on: March 21, 2020   lactase 3000 units tablet Commonly known as: LACTAID Take 2 tablets (6,000 Units total) by mouth 3 (three) times daily as needed (lactose intolerance).   lactose free nutrition Liqd Take 237 mLs by mouth 3 (three) times daily with meals.   levothyroxine 50 MCG tablet Commonly known as: SYNTHROID Take 1 tablet (50 mcg total) by mouth daily before breakfast.   metroNIDAZOLE 500 MG  tablet Commonly known as: FLAGYL Take 1 tablet (500 mg total) by mouth every 8 (eight) hours for 6 days.   montelukast 10 MG tablet Commonly known as: SINGULAIR Take 1 tablet (10 mg total) by mouth at bedtime.   polyvinyl alcohol 1.4 % ophthalmic solution Commonly known as: LIQUIFILM TEARS Place 1 drop into both eyes as needed for dry eyes.   pramipexole 0.25 MG tablet Commonly known as: MIRAPEX TK 1 T PO 1 HOUR BEFORE BEDTIME What changed:   how much to take  how to take this  when to take this  additional instructions   predniSONE 5 MG tablet Commonly known as: DELTASONE Take 5 mg by mouth daily as needed (joint/muscle pain).   PreserVision AREDS 2 Caps Take 1 capsule by mouth 2 (two) times daily.   Vitamin D (Ergocalciferol) 1.25 MG (50000 UNIT) Caps capsule Commonly known as: DRISDOL Take 50,000 Units by mouth every 7 (seven) days.       All past medical history, surgical history, allergies, family history, immunizations and medications were updated in the EMR today and reviewed under the history and medication portions of their EMR.      ROS: Negative, with the exception of above mentioned in HPI   Objective:  BP 126/71   Pulse 69   Temp 97.6 F (36.4 C) (Oral)   Ht 5' (1.524 m)   Wt 131 lb (59.4 kg)   SpO2 100%   BMI 25.58 kg/m  Body mass index is 25.58 kg/m. Gen: Afebrile. No acute distress. Nontoxic in appearance, well developed, well nourished.  Very pleasant female HENT: AT. [place].  Eyes:Pupils Equal Round Reactive to light, Extraocular movements intact,  Conjunctiva without redness, discharge or icterus. CV: RRR 2/6 systolic murmur, no edema Chest: CTAB, no wheeze or crackles. Good air movement, normal resp effort.  Abd: Soft.  Flat.  Mild tenderness right lower quadrant.  ND. BS present.  No masses palpated. No rebound or guarding.  Skin: No rashes, purpura or petechiae.  Neuro:  Normal gait. PERLA. EOMi. Alert. Oriented x3  Psych: Normal  affect, dress and demeanor. Normal speech. Normal thought content and judgment.   Assessment/Plan: Nari Vannatter is a 87 y.o. female present for OV for Hospital discharge follow up Recent hospitalization/intra-abdominal abscess [facility]) Patient is improving in her symptoms.  I do have concern she is not taking in adequate nutrition.  Encouraged her to increase to a soft bland diet, even if she has to pure proteins and veggies.  Eat small more frequent meals a day. Continue Pedialyte for electrolyte replacement until diet is returned to normal -Continue Cipro Flagyl as prescribed until completion. -Continue to slowly ease into daily routine - Ambulatory referral to Gastroenterology> patient requested [facility]-Dr. Buccini if available.  It was recommended she have colonoscopy 4-6 weeks after hospital discharge. - Comp Met (CMET) - CBC w/Diff Discussed emergent precautions.  She understands if any fevers, chills, nausea, vomiting, bowel habit changes or increase in her abdominal pain she needs to be seen immediately.    Reviewed expectations re: course of current medical issues.  Discussed self-management of symptoms.  Outlined signs and symptoms indicating need for more acute intervention.  Patient verbalized understanding and all questions were answered.  Patient received an After-Visit Summary.  Any changes in medications were reviewed and patient was provided with updated med list with their AVS.      No orders of the defined types were placed in this encounter.    Note is dictated utilizing voice recognition software. Although note has been proof read prior to signing, occasional typographical errors still can be missed. If any questions arise, please do not hesitate to call for verification.   electronically signed by:  Howard Pouch, DO  [place]

## 2020-03-25 ENCOUNTER — Telehealth (INDEPENDENT_AMBULATORY_CARE_PROVIDER_SITE_OTHER): Payer: Medicare HMO | Admitting: Family Medicine

## 2020-03-25 ENCOUNTER — Encounter: Payer: Self-pay | Admitting: Family Medicine

## 2020-03-25 DIAGNOSIS — R109 Unspecified abdominal pain: Secondary | ICD-10-CM

## 2020-03-25 DIAGNOSIS — K651 Peritoneal abscess: Secondary | ICD-10-CM | POA: Diagnosis not present

## 2020-03-25 MED ORDER — DICYCLOMINE HCL 10 MG PO CAPS: ORAL_CAPSULE | ORAL | 2 refills | Status: DC

## 2020-03-25 NOTE — Progress Notes (Signed)
VIRTUAL VISIT VIA VIDEO  I connected with Margaret [name] on 03/25/20 at  3:30 PM EST by elemedicine application and verified that I am speaking with the correct person using two identifiers. Location patient: Home Location provider: [facility], Office Persons participating in the virtual visit: Patient, Dr. Raoul Pitch and Samul Dada, CMA  I discussed the limitations of evaluation and management by telemedicine and the availability of in person appointments. The patient expressed understanding and agreed to proceed.   SUBJECTIVE Chief Complaint  Patient presents with  . Diarrhea    Pt c/o diarrhea x 1.5 weeks; pt states stools are loose and freq; pt goes within a 1/2 hour of eating. She is now 48 hours off of all antibiotics.     HPI: Margaret [name] is an 87 year old female recently hospitalized with intra-abdominal abscess.  She reports she has finally finished all of the antibiotics from her hospital discharge yesterday.  She reports today she is having more abdominal cramping after she eats.  [facility] has contacted her however her appointment is not until later in March.   She reports she is tolerating p.o. now.  No nausea or vomiting.  However after she eats she gets abdominal cramping and then diarrhea.  She denies any fever, chills or blood per rectum.  ROS: See pertinent positives and negatives per HPI.  Patient Active Problem List   Diagnosis Date Noted  . Osteoarthritis 03/18/2020  . Hyponatremia 03/11/2020  . Intra-abdominal abscess ([place]) 03/10/2020  . Connective tissue disease ([place]) 02/27/2020  . Osteoporosis 02/27/2020  . Inflammatory arthritis 02/27/2020  . Seronegative rheumatoid arthritis ([place]) 08/02/2019  . Polymyalgia rheumatica ([place]) 08/02/2019  . ANA positive 05/08/2018  . Vitamin D deficiency   . PVD (peripheral vascular disease) ([place])   . Iron deficiency anemia   . Seasonal allergies 03/29/2018  . Nocturnal leg cramps 03/29/2018  . Myalgia 03/29/2018   . Hypothyroidism 03/29/2018  . Overweight (BMI 25.0-29.9) 03/29/2018  . Asthma   . Cervical radiculopathy 05/16/2015  . DDD (degenerative disc disease), lumbar 04/26/2015  . Carotid artery stenosis 10/17/2012  . Mitral valve prolapse 08/27/2011    Social History   Tobacco Use  . Smoking status: Former Smoker    Packs/day: 0.50    Types: Cigarettes    Start date: 34    Quit date: 1965    Years since quitting: 57.1  . Smokeless tobacco: Never Used  Substance Use Topics  . Alcohol use: Yes    Alcohol/week: 1.0 standard drink    Types: 1 Glasses of wine per week    Comment: 2-3 times weekly     Current Outpatient Medications:  .  albuterol (VENTOLIN HFA) 108 (90 Base) MCG/ACT inhaler, Inhale 2 puffs into the lungs every 6 (six) hours as needed for wheezing or shortness of breath., Disp: 1 each, Rfl: 1 .  CALCIUM PO, Take 1 tablet by mouth 2 (two) times daily. Slow release, Disp: , Rfl:  .  dicyclomine (BENTYL) 10 MG capsule, 1 capsule prior to meal TID PRN, Disp: 90 capsule, Rfl: 2 .  folic acid (FOLVITE) 0.5 MG tablet, Take 0.5 mg by mouth daily., Disp: , Rfl:  .  hydroxychloroquine (PLAQUENIL) 200 MG tablet, Take 1 tablet (200 mg total) by mouth daily. Start on 1/28, Disp: , Rfl:  .  lactase (LACTAID) 3000 units tablet, Take 2 tablets (6,000 Units total) by mouth 3 (three) times daily as needed (lactose intolerance)., Disp: 60 tablet, Rfl: 0 .  lactose free  nutrition (BOOST PLUS) LIQD, Take 237 mLs by mouth 3 (three) times daily with meals., Disp: 10000 mL, Rfl: 0 .  levothyroxine (SYNTHROID) 50 MCG tablet, Take 1 tablet (50 mcg total) by mouth daily before breakfast., Disp: 90 tablet, Rfl: 3 .  montelukast (SINGULAIR) 10 MG tablet, Take 1 tablet (10 mg total) by mouth at bedtime., Disp: 90 tablet, Rfl: 3 .  Multiple Vitamins-Minerals (PRESERVISION AREDS 2) CAPS, Take 1 capsule by mouth 2 (two) times daily., Disp: , Rfl:  .  polyvinyl alcohol (LIQUIFILM TEARS) 1.4 % ophthalmic  solution, Place 1 drop into both eyes as needed for dry eyes., Disp: , Rfl:  .  pramipexole (MIRAPEX) 0.25 MG tablet, TK 1 T PO 1 HOUR BEFORE BEDTIME, Disp: 30 tablet, Rfl: 0 .  predniSONE (DELTASONE) 5 MG tablet, Take 5 mg by mouth daily as needed (joint/muscle pain)., Disp: , Rfl:  .  Vitamin D, Ergocalciferol, (DRISDOL) 1.25 MG (50000 UNIT) CAPS capsule, Take 50,000 Units by mouth every 7 (seven) days., Disp: , Rfl:   Allergies  Allergen Reactions  . Augmentin [Amoxicillin-Pot Clavulanate] Swelling and Rash    Swelling in face  . Lactose Intolerance (Gi)     OBJECTIVE: There were no vitals taken for this visit. Gen: No acute distress. Nontoxic in appearance.  HENT: AT. [place].  MMM.  Eyes:Pupils Equal Round Reactive to light, Extraocular movements intact,  Conjunctiva without redness, discharge or icterus. Neuro: Alert. Oriented x3  Psych: Normal affect and demeanor. Normal speech. Normal thought content and judgment.  ASSESSMENT AND PLAN: Margaret [name] is a 87 y.o. female present for  Intra-abdominal abscess (HCC)/abdominal pain Symptoms seem to be improving with tolerating p.o. and no fevers or chills.  Pain is mostly surrounding digestion of food after meals. -Trial of low-dose Bentyl at least 30 minutes prior to meal.  Patient was cautioned with possible dizziness side effects etc. - Ambulatory referral to Gastroenterology placed again today urgently.  Her appointment is not until later in March.  She needs an appointment urgently to follow-up on her intra-abdominal abscess and abdominal pain.  She needs to establish with the gastroenterologist, have an exam and then discuss further plan and recommendations from hospital GI is to have a colonoscopy in 4 to 6 weeks.  However, she needs an appointment before then for evaluation. Follow-up as needed      Howard Pouch, DO 03/25/2020    Orders Placed This Encounter  Procedures  . Ambulatory referral to Gastroenterology   Meds  ordered this encounter  Medications  . dicyclomine (BENTYL) 10 MG capsule    Sig: 1 capsule prior to meal TID PRN    Dispense:  90 capsule    Refill:  2    Referral Orders     Ambulatory referral to Gastroenterology

## 2020-03-26 ENCOUNTER — Telehealth: Payer: Medicare HMO | Admitting: Family Medicine

## 2020-03-27 ENCOUNTER — Other Ambulatory Visit: Payer: Self-pay

## 2020-03-27 ENCOUNTER — Ambulatory Visit: Payer: Medicare HMO | Admitting: Internal Medicine

## 2020-03-27 ENCOUNTER — Encounter: Payer: Self-pay | Admitting: Internal Medicine

## 2020-03-27 VITALS — BP 128/66 | HR 72 | Ht 59.75 in | Wt 129.0 lb

## 2020-03-27 DIAGNOSIS — R197 Diarrhea, unspecified: Secondary | ICD-10-CM | POA: Diagnosis not present

## 2020-03-27 DIAGNOSIS — R159 Full incontinence of feces: Secondary | ICD-10-CM | POA: Diagnosis not present

## 2020-03-27 DIAGNOSIS — R152 Fecal urgency: Secondary | ICD-10-CM | POA: Diagnosis not present

## 2020-03-27 DIAGNOSIS — K651 Peritoneal abscess: Secondary | ICD-10-CM

## 2020-03-27 MED ORDER — DICYCLOMINE HCL 10 MG PO CAPS: ORAL_CAPSULE | ORAL | 2 refills | Status: DC

## 2020-03-27 NOTE — Patient Instructions (Signed)
You will be contacted by [facility] in the next 2 days to arrange a CT scan.  The number on your caller ID will be [phone], please answer when they call.  If you have not heard from them in 2 days please call [phone] to schedule.    Hold the protein shakes and see if that helps the diarrhea.   You may take 2 dicyclomine at a time per Dr Carlean Purl.   I appreciate the opportunity to care for you. Silvano Rusk, MD, [facility]

## 2020-03-27 NOTE — Progress Notes (Signed)
Margaret [name] 87 y.o. [birth year] [id number]  Assessment & Plan:   Encounter Diagnoses  Name Primary?  . Intra-abdominal abscess ([place]) Yes  . Diarrhea, unspecified type   . Incontinence of feces with fecal urgency     Cause of this abscess is not clear.  I am wondering about the possibility of Crohn's disease leading to this.  The diarrhea started after treatment of the abscess so antibiotic associated diarrhea is in the differential though it is not watery diarrhea so I do not think this is C. Difficile.  I think it is prudent to repeat a CT scan prior to colonoscopy so we will do that.  She had a CBC and a CMET 8 days ago after discharge and those were normal   I have advised that she does not need to continue Pedialyte and she can hold the protein shakes to see if they are contributing to diarrhea though we looked at the ingredients there is some magnesium but not a lot of fiber.  She is lactose intolerant but she is aware of that and manages that.   Adjust dicyclomine upward to see if that improves things. Meds ordered this encounter  Medications  . dicyclomine (BENTYL) 10 MG capsule    Sig: 1-2 capsules prior to meal TID PRN    Dispense:  90 capsule    Refill:  2   Colonoscopy is anticipated after the CT scan unless we find new problems that would preclude it.  Risks benefits and indications of the procedure were explained to the patient and she understands and agrees to proceed.  She did have a history of colon polyps years ago she last had a colonoscopy 8 to 10 years ago it seems.  I appreciate the opportunity to care for this patient. CC: [name], [name], DO Dr. Arlyce Harman Dr. Stark Klein Subjective:   Chief Complaint: Abscess needs colonoscopy  HPI The patient is an 87 year old white woman with a history of PMR, seronegative rheumatoid arthritis, on prednisone chronically, here with her daughter because she has been recommended to have a colonoscopy after being  diagnosed with a right lower quadrant intra-abdominal abscess in January.  She had about a month of right lower quadrant pain and presented to the hospital for CT scanning 03/10/2020 demonstrated the following:  FINDINGS: Lower chest: Mild atelectatic changes are noted in the bases bilaterally.  Hepatobiliary: No focal liver abnormality is seen. No gallstones, gallbladder wall thickening, or biliary dilatation.  Pancreas: Unremarkable. No pancreatic ductal dilatation or surrounding inflammatory changes.  Spleen: Normal in size without focal abnormality.  Adrenals/Urinary Tract: Adrenal glands are within normal limits. Kidneys show normal enhancement pattern. No renal calculi or urinary tract obstructive changes are noted. Bladder is well distended.  Stomach/Bowel: Colon shows diffuse diverticular change without evidence of diverticulitis. Small bowel is within normal limits. Stomach is unremarkable. The appendix is well visualized centrally and demonstrates a normal enhancement pattern. Adjacent to the tip of the appendix however there is a focal air-fluid collection which measures at least 3.9 x 3.2 x 3.5 cm in greatest transverse, AP and craniocaudad projections. Surrounding inflammatory changes are noted adjacent to the collection. The adjacent small bowel appears mildly inflamed as well likely reactive in nature.  Vascular/Lymphatic: Aortic atherosclerosis. No enlarged abdominal or pelvic lymph nodes.  Reproductive: Uterus and bilateral adnexa are unremarkable. Mild changes of pelvic varices are noted.  Other: No abdominal wall hernia or abnormality. No abdominopelvic ascites.  Musculoskeletal: No acute or significant osseous  findings.  IMPRESSION: Air-fluid collection in the mid pelvis as described which lies adjacent to the tip of a otherwise normal appearing appendix. Given the longstanding symptomatology, this may represent a remote distal appendiceal  perforation with associated abscess despite the fact that the appendix appears somewhat normal in appearance. Other consideration would be sigmoid diverticulitis although the sigmoid appears somewhat remote to the air-fluid collection. Additionally, the possibility of contained small bowel perforation with abscess formation should be considered although felt to be less likely   I have personally reviewed the images.  She was treated with intravenous antibiotics, surgery was consulted she was clinically better she was discharged on oral antibiotics.  Surgery recommended a colonoscopy 4 to 6 weeks after hospitalization.  She saw Dr. Raoul Pitch recently and dicyclomine was prescribed for diarrhea and cramps and that is helping some.  She has been having a lot of very soft stools that are urgent and postprandial and she has had fecal urgency and incontinence associated with this.  Sleep is not disturbed.  Wt Readings from Last 3 Encounters:  03/27/20 129 lb (58.5 kg)  03/19/20 131 lb (59.4 kg)  03/10/20 127 lb (57.6 kg)      She has been taking a vegetable protein supplement shakes several times a day.  Appetite is off.  She cannot seem to get beyond a soft diet without having excessive cramping.  She has never had gastrointestinal problems before.  She was diagnosed with rheumatoid arthritis about 2 years ago and is on low-dose prednisone chronically.  Followed by Dr. Dossie Der at [facility] she was treated with 7 days of Cipro and metronidazole orally after discharge from the hospital, where she was in between 03/10/2020 and 03/15/2020.  She has not noted any lightheadedness or dizziness or side effects from 10 mg dicyclomine.  She has not had a follow-up with surgery nor was 1 recommended that I am aware of. Allergies  Allergen Reactions  . Augmentin [Amoxicillin-Pot Clavulanate] Swelling and Rash    Swelling in face  . Lactose Intolerance (Gi)    Current Meds  Medication Sig  . albuterol  (VENTOLIN HFA) 108 (90 Base) MCG/ACT inhaler Inhale 2 puffs into the lungs every 6 (six) hours as needed for wheezing or shortness of breath.  Marland Kitchen CALCIUM PO Take 1 tablet by mouth 2 (two) times daily. Slow release  . folic acid (FOLVITE) 0.5 MG tablet Take 0.5 mg by mouth daily.  . hydroxychloroquine (PLAQUENIL) 200 MG tablet Take 1 tablet (200 mg total) by mouth daily. Start on 1/28  . lactase (LACTAID) 3000 units tablet Take 2 tablets (6,000 Units total) by mouth 3 (three) times daily as needed (lactose intolerance).  . lactose free nutrition (BOOST PLUS) LIQD Take 237 mLs by mouth 3 (three) times daily with meals.  Marland Kitchen levothyroxine (SYNTHROID) 50 MCG tablet Take 1 tablet (50 mcg total) by mouth daily before breakfast.  . montelukast (SINGULAIR) 10 MG tablet Take 1 tablet (10 mg total) by mouth at bedtime.  . Multiple Vitamins-Minerals (PRESERVISION AREDS 2) CAPS Take 1 capsule by mouth 2 (two) times daily.  . polyvinyl alcohol (LIQUIFILM TEARS) 1.4 % ophthalmic solution Place 1 drop into both eyes as needed for dry eyes.  . pramipexole (MIRAPEX) 0.25 MG tablet TK 1 T PO 1 HOUR BEFORE BEDTIME  . predniSONE (DELTASONE) 5 MG tablet Take 5 mg by mouth daily as needed (joint/muscle pain).  . Vitamin D, Ergocalciferol, (DRISDOL) 1.25 MG (50000 UNIT) CAPS capsule Take 50,000 Units by mouth  every 7 (seven) days.  . [DISCONTINUED] dicyclomine (BENTYL) 10 MG capsule 1 capsule prior to meal TID PRN   Past Medical History:  Diagnosis Date  . Allergy   . Arthritis   . Arthritis of left knee   . Asthma   . Carotid artery stenosis 10/17/2012   stable report in 2018- "moderate on right, minimal on left"  . Carpal tunnel syndrome 2018   left.  . Cervical radiculopathy 05/16/2015   DDD C4-C5, C5-C6, and C6-C7.  Facet arthropathy throughout cervical spine.  Normal foraminal narrowing secondary to uncovertebral  arthropathy is seen bilaterally at C3-C4 and C5/C6  . Chicken pox   . DDD (degenerative disc  disease), lumbar 04/26/2015   Mild levoscoliosis, right anterior listhesis of L4 and L5.  DDD at every level.  Facet arthropathy at multiple levels.  . Deviated septum   . Glaucoma    both eyes, mild opened angle  . Heart murmur   . History of colon polyps   . History of fall   . History of frequent urinary tract infections    klebs- pansensitive. c. freundii - resitant to cefazolin and augementin  . Hypothyroidism   . Iron deficiency anemia    prior pcp told her to take Fe 325 QD  . Macular degeneration    h/o retinal edema  . Mitral valve prolapse 08/27/2011  . Murmur, cardiac 10/17/2012  . Osteoporosis   . Pneumonia   . 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 Pseudophakia of both eyes   . PVD (peripheral vascular disease) ([place])   . Rheumatic fever    87 years old   . Syncope 05/17/2017  . Thyroid disease   . Urinary incontinence   . Vertigo    had been prescribed antivert  . Vitamin D deficiency    Past Surgical History:  Procedure Laterality Date  . CARPAL TUNNEL RELEASE Bilateral 2019  . CATARACT EXTRACTION Bilateral 1997  . colonoscopy   2010  . NASAL SEPTUM SURGERY  2007  . WRIST FRACTURE SURGERY Right 1996   Social History   Social History Narrative   Marital status/children/pets: Widowed.  Moved to [place] from [place] area) 2019.  She shares a home with 1 of her daughters.  Has 1 son and 2 daughters   Education/employment: Buyer, retail of arts degree, retired Marine scientist:      -smoke alarm in the home:Yes     - wears seatbelt: Yes     - Feels safe in their relationships: Yes      2 caffeinated beverages a day no alcohol tobacco or drug use   family history includes Arthritis in her brother, father, and mother; Blindness in her maternal grandfather; Breast cancer in her daughter and paternal grandmother; COPD in her brother, mother, and paternal grandmother; Hearing loss in her father; Heart attack in her father; Heart disease in her brother  and father; Liver cancer in her sister; Prostate cancer in her brother and father; Stroke in her maternal grandfather and maternal grandmother.   Review of Systems As per HPI Decreased hearing some urinary incontinence some pedal edema joint pains All other review of systems negative  Objective:   Physical Exam BP 128/66 (BP Location: Left Arm, Patient Position: Sitting, Cuff Size: Normal)   Pulse 72   Ht 4' 11.75" (1.518 m) Comment: height measured without shoes  Wt 129 lb (58.5 kg)   BMI 25.40 kg/m  Elderly white woman NAD  Eyes anicteric  Hands show changes of osteoarthritis but not rheumatoid arthritis  Neck no supraclavicular or cervical adenopathy lungs cta cor 2-3/6 sem at the right and left upper sternal borders abd soft benign with complaints of tenderness to deep palpation in the right lower quadrant though I cannot palpate a mass bowel sounds are present no hepatosplenomegaly She is alert and oriented x3 Appropriate mood and affect

## 2020-04-04 ENCOUNTER — Other Ambulatory Visit: Payer: Self-pay

## 2020-04-04 ENCOUNTER — Encounter (HOSPITAL_COMMUNITY): Payer: Self-pay

## 2020-04-04 ENCOUNTER — Ambulatory Visit (HOSPITAL_COMMUNITY)
Admission: RE | Admit: 2020-04-04 | Discharge: 2020-04-04 | Disposition: A | Discharge status: Home / Self Care | Payer: Medicare HMO | Source: Ambulatory Visit | Attending: Internal Medicine | Admitting: Internal Medicine

## 2020-04-04 DIAGNOSIS — K651 Peritoneal abscess: Secondary | ICD-10-CM | POA: Diagnosis present

## 2020-04-04 MED ORDER — IOHEXOL 300 MG/ML  SOLN
100.0000 mL | Freq: Once | INTRAMUSCULAR | Status: AC | PRN
  Administered 2020-04-04: 100 mL via INTRAVENOUS

## 2020-04-08 ENCOUNTER — Telehealth: Payer: Self-pay | Admitting: Internal Medicine

## 2020-04-08 NOTE — Telephone Encounter (Signed)
Returned call  see results for details

## 2020-04-08 NOTE — Telephone Encounter (Signed)
Inbound call from patient returning your call in regards to her results. 

## 2020-05-22 ENCOUNTER — Other Ambulatory Visit (INDEPENDENT_AMBULATORY_CARE_PROVIDER_SITE_OTHER): Payer: Medicare HMO

## 2020-05-22 ENCOUNTER — Ambulatory Visit: Payer: Medicare HMO | Admitting: Internal Medicine

## 2020-05-22 ENCOUNTER — Encounter: Payer: Self-pay | Admitting: Internal Medicine

## 2020-05-22 VITALS — BP 104/68 | HR 72 | Ht 59.75 in | Wt 128.8 lb

## 2020-05-22 DIAGNOSIS — T3695XA Adverse effect of unspecified systemic antibiotic, initial encounter: Secondary | ICD-10-CM

## 2020-05-22 DIAGNOSIS — K5792 Diverticulitis of intestine, part unspecified, without perforation or abscess without bleeding: Secondary | ICD-10-CM

## 2020-05-22 DIAGNOSIS — K651 Peritoneal abscess: Secondary | ICD-10-CM

## 2020-05-22 DIAGNOSIS — K521 Toxic gastroenteritis and colitis: Secondary | ICD-10-CM

## 2020-05-22 LAB — CREATININE, SERUM: Creatinine, Ser: 0.79 mg/dL (ref 0.40–1.20)

## 2020-05-22 LAB — BUN: BUN: 19 mg/dL (ref 6–23)

## 2020-05-22 NOTE — Progress Notes (Signed)
Margaret [name] 87 y.o. 1929/09/18 [id number]  Assessment & Plan:   Encounter Diagnoses  Name Primary?  . Intra-abdominal abscess ([place]) Yes  . Diverticulitis - small bowel vs large intestine   . Antibiotic-associated diarrhea - resolved    When she was here before I thought maybe she had had Crohn's disease but given the resolution of symptoms and signs and the CT information we had from that visit I suspect she had some sort of perforated diverticulitis with abscess that is better.  We do not know if this was small bowel versus large intestine.  We will plan for CT scan follow-up.  I will contact her with those results.   Orders Placed This Encounter  Procedures  . CT Abdomen Pelvis W Contrast  . BUN  . Creatinine, serum   I appreciate the opportunity to care for this patient. CC: [name], [name], DO     Subjective:   Chief Complaint: Follow-up of abscess  HPI Margaret [name] is an 87 year old white woman that I met in February 2022 after she had been hospitalized with an intra-abdominal abscess thought to have been sigmoid diverticulitis, I had her do a CT scan and follow-up and the thought was maybe it was a small bowel diverticulitis.  She also had a lot of diarrhea at the time but all of that is much better.  She is back to gardening she is eating well without difficulty and her bowel movements are normal.  She is here with her daughter today.  Wt Readings from Last 3 Encounters:  05/22/20 128 lb 12.8 oz (58.4 kg)  03/27/20 129 lb (58.5 kg)  03/19/20 131 lb (59.4 kg)    Allergies  Allergen Reactions  . Augmentin [Amoxicillin-Pot Clavulanate] Swelling and Rash    Swelling in face  . Lactose Intolerance (Gi)    Current Meds  Medication Sig  . albuterol (VENTOLIN HFA) 108 (90 Base) MCG/ACT inhaler Inhale 2 puffs into the lungs every 6 (six) hours as needed for wheezing or shortness of breath.  Marland Kitchen CALCIUM PO Take 1 tablet by mouth 2 (two) times daily. Slow release  .  folic acid (FOLVITE) 0.5 MG tablet Take 0.5 mg by mouth daily.  . hydroxychloroquine (PLAQUENIL) 200 MG tablet Take 1 tablet (200 mg total) by mouth daily. Start on 1/28  . lactase (LACTAID) 3000 units tablet Take 2 tablets (6,000 Units total) by mouth 3 (three) times daily as needed (lactose intolerance).  . lactose free nutrition (BOOST PLUS) LIQD Take 237 mLs by mouth 3 (three) times daily with meals.  Marland Kitchen levothyroxine (SYNTHROID) 50 MCG tablet Take 1 tablet (50 mcg total) by mouth daily before breakfast.  . montelukast (SINGULAIR) 10 MG tablet Take 1 tablet (10 mg total) by mouth at bedtime.  . Multiple Vitamins-Minerals (PRESERVISION AREDS 2) CAPS Take 1 capsule by mouth 2 (two) times daily.  . polyvinyl alcohol (LIQUIFILM TEARS) 1.4 % ophthalmic solution Place 1 drop into both eyes as needed for dry eyes.  . pramipexole (MIRAPEX) 0.25 MG tablet TK 1 T PO 1 HOUR BEFORE BEDTIME  . predniSONE (DELTASONE) 5 MG tablet Take 5 mg by mouth daily as needed (joint/muscle pain).   Past Medical History:  Diagnosis Date  . Allergy   . Arthritis of left knee   . Asthma   . Carotid artery stenosis 10/17/2012   stable report in 2018- "moderate on right, minimal on left"  . Carpal tunnel syndrome 2018   left.  . Cervical radiculopathy 05/16/2015  DDD C4-C5, C5-C6, and C6-C7.  Facet arthropathy throughout cervical spine.  Normal foraminal narrowing secondary to uncovertebral  arthropathy is seen bilaterally at C3-C4 and C5/C6  . Chicken pox   . DDD (degenerative disc disease), lumbar 04/26/2015   Mild levoscoliosis, right anterior listhesis of L4 and L5.  DDD at every level.  Facet arthropathy at multiple levels.  . Deviated septum   . Glaucoma    both eyes, mild opened angle  . Heart murmur   . History of colon polyps   . History of fall   . History of frequent urinary tract infections    klebs- pansensitive. c. freundii - resitant to cefazolin and augementin  . Hypothyroidism   . Iron  deficiency anemia    prior pcp told her to take Fe 325 QD  . Macular degeneration    h/o retinal edema  . Mitral valve prolapse 08/27/2011  . Murmur, cardiac 10/17/2012  . Osteoarthritis   . Osteoporosis   . Pneumonia   . 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 Pseudophakia of both eyes   . PVD (peripheral vascular disease) ([place])   . Rheumatic fever    87 years old   . Syncope 05/17/2017  . Thyroid disease   . Urinary incontinence   . Vertigo    had been prescribed antivert  . Vitamin D deficiency    Past Surgical History:  Procedure Laterality Date  . CARPAL TUNNEL RELEASE Bilateral 2019  . CATARACT EXTRACTION Bilateral 1997  . COLONOSCOPY  2010  . NASAL SEPTUM SURGERY  2007  . WRIST FRACTURE SURGERY Right 1996   Social History   Social History Narrative   Marital status/children/pets: Widowed.  Moved to [place] from [place] area) 2019.  She shares a home with 1 of her daughters.  Has 1 son and 2 daughters   Education/employment: Buyer, retail of arts degree, retired Marine scientist:      -smoke alarm in the home:Yes     - wears seatbelt: Yes     - Feels safe in their relationships: Yes      2 caffeinated beverages a day no alcohol tobacco or drug use   family history includes Arthritis in her brother, father, and mother; Blindness in her maternal grandfather; Breast cancer in her daughter and paternal grandmother; COPD in her brother, mother, and paternal grandmother; Hearing loss in her father; Heart attack in her father; Heart disease in her brother and father; Liver cancer in her sister; Prostate cancer in her brother and father; Stroke in her maternal grandfather and maternal grandmother.   Review of Systems As above  Objective:   Physical Exam BP 104/68   Pulse 72   Ht 4' 11.75" (1.518 m)   Wt 128 lb 12.8 oz (58.4 kg)   SpO2 99%   BMI 25.37 kg/m  Pleasant elderly white woman in no acute distress Abdomen is soft and nontender bowel sounds are  present

## 2020-05-22 NOTE — Patient Instructions (Signed)
Your provider has requested that you go to the basement level for lab work before leaving today. Press "B" on the elevator. The lab is located at the first door on the left as you exit the elevator.  Due to recent changes in healthcare laws, you may see the results of your imaging and laboratory studies on MyChart before your provider has had a chance to review them.  We understand that in some cases there may be results that are confusing or concerning to you. Not all laboratory results come back in the same time frame and the provider may be waiting for multiple results in order to interpret others.  Please give [place] 48 hours in order for your provider to thoroughly review all the results before contacting the office for clarification of your results.   You will be contacted by [facility] in the next 2 days to arrange a CT abdomin/pelvis.  The number on your caller ID will be [phone], please answer when they call.  If you have not heard from them in 2 days please call [phone] to schedule.     I appreciate the opportunity to care for you. Silvano Rusk, MD, [facility]

## 2020-06-12 ENCOUNTER — Other Ambulatory Visit: Payer: Self-pay

## 2020-06-12 ENCOUNTER — Ambulatory Visit (HOSPITAL_COMMUNITY)
Admission: RE | Admit: 2020-06-12 | Discharge: 2020-06-12 | Disposition: A | Discharge status: Home / Self Care | Payer: Medicare HMO | Source: Ambulatory Visit | Attending: Internal Medicine | Admitting: Internal Medicine

## 2020-06-12 DIAGNOSIS — K5792 Diverticulitis of intestine, part unspecified, without perforation or abscess without bleeding: Secondary | ICD-10-CM | POA: Insufficient documentation

## 2020-06-12 DIAGNOSIS — K651 Peritoneal abscess: Secondary | ICD-10-CM | POA: Diagnosis not present

## 2020-06-12 MED ORDER — IOHEXOL 300 MG/ML  SOLN
100.0000 mL | Freq: Once | INTRAMUSCULAR | Status: AC | PRN
  Administered 2020-06-12: 100 mL via INTRAVENOUS

## 2020-08-11 ENCOUNTER — Other Ambulatory Visit: Payer: Self-pay

## 2020-08-11 DIAGNOSIS — E039 Hypothyroidism, unspecified: Secondary | ICD-10-CM

## 2020-08-11 MED ORDER — LEVOTHYROXINE SODIUM 50 MCG PO TABS: 50.0000 ug | ORAL_TABLET | Freq: Every day | ORAL | 0 refills | Status: DC

## 2020-08-13 ENCOUNTER — Other Ambulatory Visit: Payer: Self-pay

## 2020-08-13 DIAGNOSIS — E039 Hypothyroidism, unspecified: Secondary | ICD-10-CM

## 2020-08-19 ENCOUNTER — Other Ambulatory Visit: Payer: Self-pay

## 2020-08-19 DIAGNOSIS — E039 Hypothyroidism, unspecified: Secondary | ICD-10-CM

## 2020-08-21 ENCOUNTER — Other Ambulatory Visit: Payer: Self-pay

## 2020-08-21 DIAGNOSIS — E039 Hypothyroidism, unspecified: Secondary | ICD-10-CM

## 2020-09-01 ENCOUNTER — Other Ambulatory Visit: Payer: Self-pay

## 2020-09-01 DIAGNOSIS — E039 Hypothyroidism, unspecified: Secondary | ICD-10-CM

## 2020-09-01 MED ORDER — LEVOTHYROXINE SODIUM 50 MCG PO TABS: 50.0000 ug | ORAL_TABLET | Freq: Every day | ORAL | 0 refills | Status: DC

## 2020-09-01 NOTE — Telephone Encounter (Signed)
Patient regarding refill of her meds.  She is out of meds and has been trying to get prescription approved. I scheduled her an appt with Dr. Raoul Pitch on Friday 7/15 @2 :30PM.  30 d/s of medication    Closed not no follow up needed at this time.  Clinical assistant had already spoke to patient.

## 2020-09-05 ENCOUNTER — Ambulatory Visit (INDEPENDENT_AMBULATORY_CARE_PROVIDER_SITE_OTHER): Payer: Medicare HMO | Admitting: Family Medicine

## 2020-09-05 ENCOUNTER — Other Ambulatory Visit: Payer: Self-pay

## 2020-09-05 ENCOUNTER — Encounter: Payer: Self-pay | Admitting: Family Medicine

## 2020-09-05 VITALS — BP 125/69 | HR 72 | Temp 98.0°F | Ht 60.0 in | Wt 129.0 lb

## 2020-09-05 DIAGNOSIS — M2550 Pain in unspecified joint: Secondary | ICD-10-CM | POA: Diagnosis not present

## 2020-09-05 DIAGNOSIS — E559 Vitamin D deficiency, unspecified: Secondary | ICD-10-CM

## 2020-09-05 DIAGNOSIS — J302 Other seasonal allergic rhinitis: Secondary | ICD-10-CM

## 2020-09-05 DIAGNOSIS — E039 Hypothyroidism, unspecified: Secondary | ICD-10-CM

## 2020-09-05 DIAGNOSIS — G4762 Sleep related leg cramps: Secondary | ICD-10-CM | POA: Diagnosis not present

## 2020-09-05 MED ORDER — MONTELUKAST SODIUM 10 MG PO TABS: 10.0000 mg | ORAL_TABLET | Freq: Every day | ORAL | 3 refills | Status: DC

## 2020-09-05 NOTE — Progress Notes (Signed)
This visit occurred during the SARS-CoV-2 public health emergency.  Safety protocols were in place, including screening questions prior to the visit, additional usage of staff PPE, and extensive cleaning of exam room while observing appropriate contact time as indicated for disinfecting solutions.    Margaret [name] , 05/25/1929, 87 y.o., female MRN: [id number] Patient Care Team    Relationship Specialty Notifications Start End  Ma Hillock, DO PCP - General Family Medicine  03/08/19   Bernarda Caffey, MD Consulting Physician Ophthalmology  03/29/18   Konrad Felix, MD Consulting Physician Ophthalmology  03/29/18   Devra Dopp, MD Referring Physician Dermatology  08/02/19   Valinda Party, MD  Rheumatology  08/02/19     Chief Complaint  Patient presents with   Hypothyroidism    Dargan; pt is not fasting     Subjective: Pt presents for an OV for follow up in [facility] PMR/RA: Patient reports she is much improved from when she last saw provider.  She is established with rheumatology.  She is on low-dose prednisone as needed, Plaquenil.  She has not been taking methotrexate. Prior note: Patient presents today to discuss her arthralgias and myalgias.  Symptoms are worsening.  She reports increased symptoms in her left shoulder her left hand and her left hip.  Her symptoms are bilateral, worse on the left side. Her CMP, CRP, ESR, TSH, CBC and Lyme titers were all normal. Her ANA was positive, 1:640 nuclear homogenous. CCP and RF normal.  Hypothyroidism, unspecified type Patient reports compliance with levothyroxine 50 mcg daily.  Labs are due today.  Nocturnal leg cramps/RLS Patient reports the leg cramps have improved since she was last seen.  She is having more restless leg symptoms.  She is not taking the Mirapex because she did not feel it was very helpful. Prior note: Patient reports he had forgotten about the Mirapex she had been on.  She would like to restart this medication.   She has been prescribed Mirapex 0.25 mg nightly by her prior PCP for nocturnal leg cramping.  She feels this has worked well for her. Vitamin D insufficiency: Patient had completed the over-the-counter vitamin D after abnormal labs April 2020.  She has started a daily vitamin D.  Labs have not been rechecked.  Seasonal allergies/Mild intermittent asthma, unspecified whether complicated Patient reports she has asthma and allergies.    Patient reports compliance with Singulair and her symptoms are well controlled.  She also uses Flonase and albuterol as needed.  She feels her symptoms are well controlled. Depression screen [facility] 2/9 03/19/2020 03/29/2018  Decreased Interest - 0  Down, Depressed, Hopeless 0 0  PHQ - 2 Score 0 0    Allergies  Allergen Reactions   Augmentin [Amoxicillin-Pot Clavulanate] Swelling and Rash    Swelling in face   Lactose Intolerance (Gi)    Social History   Social History Narrative   Marital status/children/pets: Widowed.  Moved to [place] from [place] area) 2019.  She shares a home with 1 of her daughters.  Has 1 son and 2 daughters   Education/employment: Buyer, retail of arts degree, retired Marine scientist:      -smoke alarm in the home:Yes     - wears seatbelt: Yes     - Feels safe in their relationships: Yes      2 caffeinated beverages a day no alcohol tobacco or drug use   Past Medical History:  Diagnosis Date   Allergy  Arthritis of left knee    Asthma    Carotid artery stenosis 10/17/2012   stable report in 2018- "moderate on right, minimal on left"   Carpal tunnel syndrome 2018   left.   Cervical radiculopathy 05/16/2015   DDD C4-C5, C5-C6, and C6-C7.  Facet arthropathy throughout cervical spine.  Normal foraminal narrowing secondary to uncovertebral  arthropathy is seen bilaterally at C3-C4 and C5/C6   Chicken pox    DDD (degenerative disc disease), lumbar 04/26/2015   Mild levoscoliosis, right anterior listhesis of L4 and L5.   DDD at every level.  Facet arthropathy at multiple levels.   Deviated septum    Glaucoma    both eyes, mild opened angle   Heart murmur    History of colon polyps    History of fall    History of frequent urinary tract infections    klebs- pansensitive. c. 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    Pseudophakia of both eyes    PVD (peripheral vascular disease) ([place])    Rheumatic fever    87 years old    Syncope 05/17/2017   Thyroid disease    Urinary incontinence    Vertigo    had been prescribed antivert   Vitamin D deficiency    Past Surgical History:  Procedure Laterality Date   CARPAL TUNNEL RELEASE Bilateral 2019   CATARACT EXTRACTION Bilateral 1997   COLONOSCOPY  2010   NASAL SEPTUM SURGERY  2007   WRIST FRACTURE SURGERY Right 1996   Family History  Problem Relation Age of Onset   Arthritis Mother    COPD Mother    Prostate cancer Father    Arthritis Father    Hearing loss Father    Heart disease Father    Heart attack Father    Stroke Maternal Grandmother    Liver cancer Sister    Arthritis Brother    Prostate cancer Brother    COPD Brother    Heart disease Brother    Breast cancer Daughter    Blindness Maternal Grandfather    Stroke Maternal Grandfather    COPD Paternal Grandmother    Breast cancer Paternal Grandmother    Allergies as of 09/05/2020       Reactions   Augmentin [amoxicillin-pot Clavulanate] Swelling, Rash   Swelling in face   Lactose Intolerance (gi)         Medication List        Accurate as of September 05, 2020 11:59 PM. If you have any questions, ask your nurse or doctor.          STOP taking these  medications    lactose free nutrition Liqd Stopped by: Howard Pouch, DO       TAKE these medications    albuterol 108 (90 Base) MCG/ACT inhaler Commonly known as: VENTOLIN HFA Inhale 2 puffs into the lungs every 6 (six) hours as needed for wheezing or shortness of breath.   CALCIUM PO Take 1 tablet by mouth 2 (two) times daily. Slow release   folic acid 0.5 MG tablet Commonly known as:  FOLVITE Take 0.5 mg by mouth daily.   hydroxychloroquine 200 MG tablet Commonly known as: PLAQUENIL Take 1 tablet (200 mg total) by mouth daily. Start on 1/28   lactase 3000 units tablet Commonly known as: LACTAID Take 2 tablets (6,000 Units total) by mouth 3 (three) times daily as needed (lactose intolerance).   levothyroxine 50 MCG tablet Commonly known as: SYNTHROID Take 1 tablet (50 mcg total) by mouth daily before breakfast.   montelukast 10 MG tablet Commonly known as: SINGULAIR Take 1 tablet (10 mg total) by mouth at bedtime.   polyvinyl alcohol 1.4 % ophthalmic solution Commonly known as: LIQUIFILM TEARS Place 1 drop into both eyes as needed for dry eyes.   pramipexole 0.25 MG tablet Commonly known as: MIRAPEX TK 1 T PO 1 HOUR BEFORE BEDTIME   predniSONE 5 MG tablet Commonly known as: DELTASONE Take 5 mg by mouth daily as needed (joint/muscle pain).   PreserVision AREDS 2 Caps Take 1 capsule by mouth 2 (two) times daily.        All past medical history, surgical history, allergies, family history, immunizations andmedications were updated in the EMR today and reviewed under the history and medication portions of their EMR.     ROS: Negative, with the exception of above mentioned in HPI   Objective:  BP 125/69   Pulse 72   Temp 98 F (36.7 C) (Oral)   Ht 5' (1.524 m)   Wt 129 lb (58.5 kg)   SpO2 99%   BMI 25.19 kg/m  Body mass index is 25.19 kg/m. Gen: Afebrile. No acute distress. Nontoxic in appearance, well  Gen: Afebrile. No acute distress.  HENT: AT.  [place].  No cough.  No hoarseness. Eyes:Pupils Equal Round Reactive to light, Extraocular movements intact,  Conjunctiva without redness, discharge or icterus. Neck/lymp/endocrine: Supple, no lymphadenopathy, no thyromegaly CV: RRR no murmur, no edema, +2/4 P posterior tibialis pulses Chest: CTAB, no wheeze or crackles Skin: No rashes, purpura or petechiae.  Neuro:  Normal gait. PERLA. EOMi. Alert. Orientedx3 Psych: Normal affect, dress and demeanor. Normal speech. Normal thought content and judgment.   No results found. No results found. No results found for this or any previous visit (from the past 24 hour(s)).  Assessment/Plan: Nour Rodrigues is a 87 y.o. female present for OV for  Hypothyroidism, unspecified type Has been stable on levothyroxine 50 mcg daily.   Labs collected today TSH, T4 free.  Refills will be provided once results are received. LDL levels collected today CBC and CMP collected today  Vit d def/osteoporosis:  prolia injections are provided by rheumatology.  Vitamin D levels collected today. PTH and calcium levels collected today  Rheumatoid arthritis/PMR: - Now established with rheumatology and prescribed prednisone, and Plaquenil.  Her symptoms are greatly improved. Continue routine follow-ups with rheumatology  Nocturnal leg cramps/RLS Restlessness is not well controlled. DC Mirapex. Trial of low-dose Lyrica nightly. Magnesium, Iron, B12 levels collected today.  Seasonal allergies/Mild intermittent asthma, unspecified whether complicated Stable. Continue Singulair  Continue albuterol as needed  -Continue Flonase    Reviewed expectations re: course of current medical issues. Discussed self-management of symptoms. Outlined signs and symptoms indicating need for more acute intervention. Patient verbalized understanding and all questions were answered. Patient received an After-Visit Summary.    Orders Placed This Encounter  Procedures   T4, free    TSH   Direct LDL   Comp Met (CMET)   Magnesium   Vitamin D (25 hydroxy)   Iron, TIBC  and Ferritin Panel   B12   CBC w/Diff   PTH, Intact and Calcium    Meds ordered this encounter  Medications   montelukast (SINGULAIR) 10 MG tablet    Sig: Take 1 tablet (10 mg total) by mouth at bedtime.    Dispense:  90 tablet    Refill:  3    Referral Orders  No referral(s) requested today     Note is dictated utilizing voice recognition software. Although note has been proof read prior to signing, occasional typographical errors still can be missed. If any questions arise, please do not hesitate to call for verification.   electronically signed by:  Howard Pouch, DO  [place]

## 2020-09-05 NOTE — Patient Instructions (Signed)
Muscle Cramps and Spasms Muscle cramps and spasms are when muscles tighten by themselves. They usually get better within minutes. Muscle cramps are painful. They are usually stronger and last longer than muscle spasms. Muscle spasms may or may not be painful.They can last a few seconds or much longer. Cramps and spasms can affect any muscle, but they occur most often in the calf muscles of the leg. They are usually not caused by a serious problem. In many cases, the cause is not known. Some common causes include: Doing more physical work or exercise than your body is ready for. Using the muscles too much (overuse) by repeating certain movements too many times. Staying in a certain position for a long time. Playing a sport or doing an activity without preparing properly. Using bad form or technique while playing a sport or doing an activity. Not having enough water in your body (dehydration). Injury. Side effects of some medicines. Low levels of the salts and minerals in your blood (electrolytes), such as low potassium or calcium. Follow these instructions at home: Managing pain and stiffness     Massage, stretch, and relax the muscle. Do this for many minutes at a time. If told, put heat on tight or tense muscles as often as told by your doctor. Use the heat source that your doctor recommends, such as a moist heat pack or a heating pad. Place a towel between your skin and the heat source. Leave the heat on for 20-30 minutes. Remove the heat if your skin turns bright red. This is very important if you are not able to feel pain, heat, or cold. You may have a greater risk of getting burned. If told, put ice on the affected area. This may help if you are sore or have pain after a cramp or spasm. Put ice in a plastic bag. Place a towel between your skin and the bag. Leave the ice on for 20 minutes, 2-3 times a day. Try taking hot showers or baths to help relax tight muscles. Eating and  drinking Drink enough fluid to keep your pee (urine) pale yellow. Eat a healthy diet to help ensure that your muscles work well. This should include: Fruits and vegetables. Lean protein. Whole grains. Low-fat or nonfat dairy products. General instructions If you are having cramps often, avoid intense exercise for several days. Take over-the-counter and prescription medicines only as told by your doctor. Watch for any changes in your symptoms. Keep all follow-up visits as told by your doctor. This is important. Contact a doctor if: Your cramps or spasms get worse or happen more often. Your cramps or spasms do not get better with time. Summary Muscle cramps and spasms are when muscles tighten by themselves. They usually get better within minutes. Cramps and spasms occur most often in the calf muscles of the leg. Massage, stretch, and relax the muscle. This may help the cramp or spasm go away. Drink enough fluid to keep your pee (urine) pale yellow. This information is not intended to replace advice given to you by your health care provider. Make sure you discuss any questions you have with your healthcare provider. Document Revised: 07/04/2017 Document Reviewed: 07/04/2017 Elsevier Patient Education  [place].

## 2020-09-08 ENCOUNTER — Telehealth: Payer: Self-pay | Admitting: Family Medicine

## 2020-09-08 DIAGNOSIS — E039 Hypothyroidism, unspecified: Secondary | ICD-10-CM

## 2020-09-08 LAB — CBC WITH DIFFERENTIAL/PLATELET
Absolute Monocytes: 686 cells/uL (ref 200–950)
Basophils Absolute: 39 cells/uL (ref 0–200)
Basophils Relative: 0.5 %
Eosinophils Absolute: 156 cells/uL (ref 15–500)
Eosinophils Relative: 2 %
HCT: 40.4 % (ref 35.0–45.0)
Hemoglobin: 14.2 g/dL (ref 11.7–15.5)
Lymphs Abs: 757 cells/uL — ABNORMAL LOW (ref 850–3900)
MCH: 31.3 pg (ref 27.0–33.0)
MCHC: 35.1 g/dL (ref 32.0–36.0)
MCV: 89.2 fL (ref 80.0–100.0)
MPV: 11.6 fL (ref 7.5–12.5)
Monocytes Relative: 8.8 %
Neutro Abs: 6162 cells/uL (ref 1500–7800)
Neutrophils Relative %: 79 %
Platelets: 252 10*3/uL (ref 140–400)
RBC: 4.53 10*6/uL (ref 3.80–5.10)
RDW: 12.6 % (ref 11.0–15.0)
Total Lymphocyte: 9.7 %
WBC: 7.8 10*3/uL (ref 3.8–10.8)

## 2020-09-08 LAB — MAGNESIUM: Magnesium: 2.1 mg/dL (ref 1.5–2.5)

## 2020-09-08 LAB — PTH, INTACT AND CALCIUM
Calcium: 9.8 mg/dL (ref 8.6–10.4)
PTH: 26 pg/mL (ref 16–77)

## 2020-09-08 LAB — COMPREHENSIVE METABOLIC PANEL
AG Ratio: 2.3 (calc) (ref 1.0–2.5)
ALT: 22 U/L (ref 6–29)
AST: 23 U/L (ref 10–35)
Albumin: 4.5 g/dL (ref 3.6–5.1)
Alkaline phosphatase (APISO): 51 U/L (ref 37–153)
BUN: 20 mg/dL (ref 7–25)
CO2: 29 mmol/L (ref 20–32)
Calcium: 9.8 mg/dL (ref 8.6–10.4)
Chloride: 103 mmol/L (ref 98–110)
Creat: 0.91 mg/dL (ref 0.60–0.95)
Globulin: 2 g/dL (calc) (ref 1.9–3.7)
Glucose, Bld: 85 mg/dL (ref 65–99)
Potassium: 4.1 mmol/L (ref 3.5–5.3)
Sodium: 140 mmol/L (ref 135–146)
Total Bilirubin: 0.7 mg/dL (ref 0.2–1.2)
Total Protein: 6.5 g/dL (ref 6.1–8.1)

## 2020-09-08 LAB — IRON,TIBC AND FERRITIN PANEL
%SAT: 44 % (calc) (ref 16–45)
Ferritin: 60 ng/mL (ref 16–288)
Iron: 133 ug/dL (ref 45–160)
TIBC: 303 mcg/dL (calc) (ref 250–450)

## 2020-09-08 LAB — TSH: TSH: 2.31 mIU/L (ref 0.40–4.50)

## 2020-09-08 LAB — VITAMIN D 25 HYDROXY (VIT D DEFICIENCY, FRACTURES): Vit D, 25-Hydroxy: 36 ng/mL (ref 30–100)

## 2020-09-08 LAB — T4, FREE: Free T4: 1.3 ng/dL (ref 0.8–1.8)

## 2020-09-08 LAB — VITAMIN B12: Vitamin B-12: 1106 pg/mL — ABNORMAL HIGH (ref 200–1100)

## 2020-09-08 LAB — LDL CHOLESTEROL, DIRECT: Direct LDL: 128 mg/dL — ABNORMAL HIGH (ref <100)

## 2020-09-08 MED ORDER — PREGABALIN 25 MG PO CAPS: 25.0000 mg | ORAL_CAPSULE | Freq: Every day | ORAL | 1 refills | Status: DC

## 2020-09-08 MED ORDER — LEVOTHYROXINE SODIUM 50 MCG PO TABS
50.0000 ug | ORAL_TABLET | Freq: Every day | ORAL | 3 refills | Status: DC
Start: 2020-09-08 — End: 2021-06-05

## 2020-09-08 NOTE — Addendum Note (Signed)
Addended by: Howard Pouch A on: 09/08/2020 12:18 PM   Modules accepted: Orders

## 2020-09-08 NOTE — Telephone Encounter (Signed)
Spoke with pt regarding labs and instructions. Pt agreed to start medication.

## 2020-09-08 NOTE — Telephone Encounter (Signed)
Please call patient Margaret [name], kidney and thyroid function are normal> I have refilled her thyroid medication.  Doses unchanged. Blood cell counts and electrolytes are normal Iron levels, vitamin D and B12 levels are normal. LDL/bad cholesterol looks good and is at goal for her.  For her restless leg there is a medication called Lyrica that can be taken before bed that may help with the symptoms.  If she is interested, I will call this in for her today to get started.   She elects to start new medication, please schedule her for a follow-up in 3 months.

## 2020-10-01 ENCOUNTER — Telehealth: Payer: Self-pay | Admitting: Family Medicine

## 2020-10-01 NOTE — Telephone Encounter (Signed)
Left message for patient to schedule Annual Wellness Visit.  Please schedule with Nurse Health Advisor Julie Greer, RN at [facility]. Please call [phone] ask for Kathy  

## 2020-10-10 ENCOUNTER — Encounter (INDEPENDENT_AMBULATORY_CARE_PROVIDER_SITE_OTHER): Payer: Medicare HMO | Admitting: Ophthalmology

## 2020-10-10 ENCOUNTER — Encounter (INDEPENDENT_AMBULATORY_CARE_PROVIDER_SITE_OTHER): Payer: Self-pay

## 2020-10-10 DIAGNOSIS — H04123 Dry eye syndrome of bilateral lacrimal glands: Secondary | ICD-10-CM

## 2020-10-10 DIAGNOSIS — Z79899 Other long term (current) drug therapy: Secondary | ICD-10-CM

## 2020-10-10 DIAGNOSIS — H401131 Primary open-angle glaucoma, bilateral, mild stage: Secondary | ICD-10-CM

## 2020-10-10 DIAGNOSIS — H353132 Nonexudative age-related macular degeneration, bilateral, intermediate dry stage: Secondary | ICD-10-CM

## 2020-10-10 DIAGNOSIS — Z961 Presence of intraocular lens: Secondary | ICD-10-CM

## 2020-10-10 DIAGNOSIS — H3581 Retinal edema: Secondary | ICD-10-CM

## 2020-10-20 ENCOUNTER — Encounter (INDEPENDENT_AMBULATORY_CARE_PROVIDER_SITE_OTHER): Payer: Medicare HMO | Admitting: Ophthalmology

## 2020-10-20 ENCOUNTER — Encounter (INDEPENDENT_AMBULATORY_CARE_PROVIDER_SITE_OTHER): Payer: Self-pay

## 2020-10-20 DIAGNOSIS — H04123 Dry eye syndrome of bilateral lacrimal glands: Secondary | ICD-10-CM

## 2020-10-20 DIAGNOSIS — Z79899 Other long term (current) drug therapy: Secondary | ICD-10-CM

## 2020-10-20 DIAGNOSIS — Z961 Presence of intraocular lens: Secondary | ICD-10-CM

## 2020-10-20 DIAGNOSIS — H353132 Nonexudative age-related macular degeneration, bilateral, intermediate dry stage: Secondary | ICD-10-CM

## 2020-10-20 DIAGNOSIS — H401131 Primary open-angle glaucoma, bilateral, mild stage: Secondary | ICD-10-CM

## 2020-10-20 DIAGNOSIS — H3581 Retinal edema: Secondary | ICD-10-CM

## 2020-10-22 ENCOUNTER — Ambulatory Visit (INDEPENDENT_AMBULATORY_CARE_PROVIDER_SITE_OTHER): Payer: Medicare HMO | Admitting: *Deleted

## 2020-10-22 DIAGNOSIS — Z0000 Encounter for general adult medical examination without abnormal findings: Secondary | ICD-10-CM

## 2020-10-22 NOTE — Progress Notes (Addendum)
Subjective:   Margaret [name] is a 87 y.o. female who presents for Medicare Annual (Subsequent) preventive examination.  I connected with  Margaret [name] on 10/22/20 by a telephone enabled telemedicine application and verified that I am speaking with the correct person using two identifiers.   I discussed the limitations of evaluation and management by telemedicine. The patient expressed understanding and agreed to proceed.    Review of Systems    na Cardiac Risk Factors include: advanced age (>74mn, >>70women);obesity (BMI >30kg/m2)     Objective:    Today's Vitals   There is no height or weight on file to calculate BMI.  Advanced Directives 10/22/2020 03/11/2020 03/10/2020  Does Patient Have a Medical Advance Directive? Yes Yes Yes  Type of [place]aramed[place]of [place] will -  Does patient want to make changes to medical advance directive? - No - Patient declined -  Copy of [place] Chart? No - copy requested - -    Current Medications (verified) Outpatient Encounter Medications as of 10/22/2020  Medication Sig   albuterol (VENTOLIN HFA) 108 (90 Base) MCG/ACT inhaler Inhale 2 puffs into the lungs every 6 (six) hours as needed for wheezing or shortness of breath.   CALCIUM PO Take 1 tablet by mouth 2 (two) times daily. Slow release   folic acid (FOLVITE) 0.5 MG tablet Take 0.5 mg by mouth daily.   hydroxychloroquine (PLAQUENIL) 200 MG tablet Take 1 tablet (200 mg total) by mouth daily. Start on 1/28   lactase (LACTAID) 3000 units tablet Take 2 tablets (6,000 Units total) by mouth 3 (three) times daily as needed (lactose intolerance).   levothyroxine (SYNTHROID) 50 MCG tablet Take 1 tablet (50 mcg total) by mouth daily before breakfast.   montelukast (SINGULAIR) 10 MG tablet Take 1 tablet (10 mg total) by mouth at bedtime.   Multiple Vitamins-Minerals (PRESERVISION AREDS 2) CAPS Take 1 capsule by mouth 2  (two) times daily.   polyvinyl alcohol (LIQUIFILM TEARS) 1.4 % ophthalmic solution Place 1 drop into both eyes as needed for dry eyes.   predniSONE (DELTASONE) 5 MG tablet Take 5 mg by mouth daily as needed (joint/muscle pain).   pregabalin (LYRICA) 25 MG capsule Take 1 capsule (25 mg total) by mouth at bedtime.   No facility-administered encounter medications on file as of 10/22/2020.    Allergies (verified) Augmentin [amoxicillin-pot clavulanate] and Lactose intolerance (gi)   History: Past Medical History:  Diagnosis Date   Allergy    Arthritis of left knee    Asthma    Carotid artery stenosis 10/17/2012   stable report in 2018- "moderate on right, minimal on left"   Carpal tunnel syndrome 2018   left.   Cervical radiculopathy 05/16/2015   DDD C4-C5, C5-C6, and C6-C7.  Facet arthropathy throughout cervical spine.  Normal foraminal narrowing secondary to uncovertebral  arthropathy is seen bilaterally at C3-C4 and C5/C6   Chicken pox    DDD (degenerative disc disease), lumbar 04/26/2015   Mild levoscoliosis, right anterior listhesis of L4 and L5.  DDD at every level.  Facet arthropathy at multiple levels.   Deviated septum    Glaucoma    both eyes, mild opened angle   Heart murmur    History of colon polyps    History of fall    History of frequent urinary tract infections    klebs- pansensitive. c. 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    Pseudophakia of both eyes    PVD (peripheral vascular disease) ([place])    Rheumatic fever    87 years old    Syncope 05/17/2017   Thyroid disease    Urinary incontinence    Vertigo    had been prescribed  antivert   Vitamin D deficiency    Past Surgical History:  Procedure Laterality Date   CARPAL TUNNEL RELEASE Bilateral 2019   CATARACT EXTRACTION Bilateral 1997   COLONOSCOPY  2010   NASAL SEPTUM SURGERY  2007   WRIST FRACTURE SURGERY Right 1996   Family History  Problem Relation Age of Onset   Arthritis Mother    COPD Mother    Prostate cancer Father    Arthritis Father    Hearing loss Father    Heart disease Father    Heart attack Father    Stroke Maternal Grandmother    Liver cancer Sister    Arthritis Brother    Prostate cancer Brother    COPD Brother    Heart disease Brother    Breast cancer Daughter    Blindness Maternal Grandfather    Stroke Maternal Grandfather    COPD Paternal Grandmother    Breast cancer Paternal Grandmother    Social History   Socioeconomic History   Marital status: Widowed    Spouse name: Not on file   Number of children: 3   Years of education: Not on file   Highest education level: Not on file  Occupational History   Occupation: retired  Tobacco Use   Smoking status: Former    Packs/day: 0.50    Types: Cigarettes    Start date: 1952    Quit date: 1965    Years since quitting: 57.7   Smokeless tobacco: Never  Vaping Use   Vaping Use: Never used  Substance and Sexual Activity   Alcohol use: Not Currently    Alcohol/week: 5.0 standard drinks    Types: 5 Standard drinks or equivalent per week   Drug use: Never   Sexual activity: Not Currently    Partners: Male  Other Topics Concern   Not on file  Social History Narrative   Marital status/children/pets: Widowed.  Moved to [place] from [place] area) 2019.  She shares a home with 1 of her daughters.  Has 1 son and 2 daughters   Education/employment: Buyer, retail of arts degree, retired Marine scientist:      -smoke alarm in the home:Yes     - wears seatbelt: Yes     - Feels safe in their relationships: Yes      2 caffeinated beverages a day no alcohol tobacco or  drug use   Social Determinants of Radio broadcast assistant Strain: Low Risk    Difficulty of Paying Living Expenses: Not hard at all  Food Insecurity: No Food Insecurity   Worried About Charity fundraiser in the Last Year: Never true   [place] in the Last Year: Never true  Transportation Needs: No Transportation Needs   Lack of Transportation (Medical): No   Lack of  Transportation (Non-Medical): No  Physical Activity: Insufficiently Active   Days of Exercise per Week: 3 days   Minutes of Exercise per Session: 20 min  Stress: No Stress Concern Present   Feeling of Stress : Not at all  Social Connections: Socially Isolated   Frequency of Communication with Friends and Family: Twice a week   Frequency of Social Gatherings with Friends and Family: Three times a week   Attends Religious Services: Never   Active Member of Clubs or Organizations: No   Attends Archivist Meetings: Not on file   Marital Status: Widowed    Tobacco Counseling Counseling given: Not Answered   Clinical Intake:  Pre-visit preparation completed: Yes  Pain : No/denies pain     Nutritional Risks: None Diabetes: No  How often do you need to have someone help you when you read instructions, pamphlets, or other written materials from your doctor or pharmacy?: 1 - Never  Diabetic?no   Interpreter Needed?: No  Information entered by :: Leroy Kennedy LPN   Activities of Daily Living In your present state of health, do you have any difficulty performing the following activities: 10/22/2020 03/11/2020  Hearing? Tempie Donning  Vision? Y N  Difficulty concentrating or making decisions? N N  Walking or climbing stairs? Y N  Dressing or bathing? N N  Doing errands, shopping? Tempie Donning  Preparing Food and eating ? N -  Using the Toilet? N -  In the past six months, have you accidently leaked urine? N -  Do you have problems with loss of bowel control? N -  Managing your Medications? N -  Managing  your Finances? N -  Housekeeping or managing your Housekeeping? N -  Some recent data might be hidden    Patient Care Team: Ma Hillock, DO as PCP - General (Family Medicine) Bernarda Caffey, MD as Consulting Physician (Ophthalmology) Konrad Felix, MD as Consulting Physician (Ophthalmology) [place], Jennefer Bravo, MD as Referring Physician (Dermatology) Valinda Party, MD (Rheumatology)  Indicate any recent Medical Services you may have received from other than [facility] providers in the past year (date may be approximate).     Assessment:   This is a routine wellness examination for [place].  Hearing/Vision screen Hearing Screening - Comments:: Hearing aids in both ears Vision Screening - Comments:: Up to date Dr. Marliss Czar Dr. Kathlen Mody Glaucoma  Dietary issues and exercise activities discussed: Current Exercise Habits: Home exercise routine, Type of exercise: walking (gardening/walk dogs), Time (Minutes): 20, Frequency (Times/Week): 3, Weekly Exercise (Minutes/Week): 60   Goals Addressed             This Visit's Progress    Patient Stated       Maintain current lifestyle       Depression Screen PHQ 2/9 Scores 10/22/2020 03/19/2020 03/29/2018  PHQ - 2 Score 0 0 0    Fall Risk Fall Risk  10/22/2020 03/25/2020 03/29/2018  Falls in the past year? 0 0 1  Number falls in past yr: 0 0 0  Injury with Fall? 0 0 0  Risk for fall due to : - - Impaired vision  Follow up Falls evaluation completed;Falls prevention discussed Falls evaluation completed Education provided    FALL RISK PREVENTION PERTAINING TO THE HOME:  Any stairs in or around the home? Yes  If so, are there any without handrails? No  Home free of loose throw rugs in walkways, pet beds, electrical cords, etc? Yes  Adequate lighting in your  home to reduce risk of falls? Yes   ASSISTIVE DEVICES UTILIZED TO PREVENT FALLS:  Life alert? No  Use of a cane, walker or w/c? No  Grab bars in the bathroom? No  Shower  chair or bench in shower? No  Elevated toilet seat or a handicapped toilet? No   TIMED UP AND GO:  Was the test performed? No .    Cognitive Function:  Normal cognitive status assessed by direct observation by this Nurse Health Advisor. No abnormalities found.          Immunizations Immunization History  Administered Date(s) Administered   IPV 08/09/2018, 02/27/2019, 08/29/2019   Influenza, High Dose Seasonal PF 12/14/2017   Influenza-Unspecified 10/10/2019   PFIZER(Purple Top)SARS-COV-2 Vaccination 03/30/2019, 04/24/2019, 10/10/2019, 08/08/2020   Zoster Recombinat (Shingrix) 11/07/2018, 01/08/2019   Zoster, Live 02/22/2005    TDAP status: Due, Education has been provided regarding the importance of this vaccine. Advised may receive this vaccine at local pharmacy or Health Dept. Aware to provide a copy of the vaccination record if obtained from local pharmacy or Health Dept. Verbalized acceptance and understanding.  Flu Vaccine status: Up to date  Pneumococcal vaccine status: Due, Education has been provided regarding the importance of this vaccine. Advised may receive this vaccine at local pharmacy or Health Dept. Aware to provide a copy of the vaccination record if obtained from local pharmacy or Health Dept. Verbalized acceptance and understanding.  Covid-19 vaccine status: Completed vaccines  Qualifies for Shingles Vaccine? No   Zostavax completed Yes   Shingrix Completed?: Yes  Screening Tests Health Maintenance  Topic Date Due   DEXA SCAN  Never done   PNA vac Low Risk Adult (2 of 2 - PPSV23) 02/23/2016   INFLUENZA VACCINE  09/22/2020   COVID-19 Vaccine (5 - Booster for Pfizer series) 12/08/2020   TETANUS/TDAP  02/23/2024   Zoster Vaccines- Shingrix  Completed   HPV VACCINES  Aged Out    Health Maintenance  Health Maintenance Due  Topic Date Due   DEXA SCAN  Never done   PNA vac Low Risk Adult (2 of 2 - PPSV23) 02/23/2016   INFLUENZA VACCINE  09/22/2020     Colorectal cancer screening: No longer required.   Mammogram status: No longer required due to age.  Bone Density status: Completed patient states . Results reflect: Bone density results: OSTEOPOROSIS. Repeat every 2 years.  Lung Cancer Screening: (Low Dose CT Chest recommended if Age 79-80 years, 30 pack-year currently smoking OR have quit w/in 15years.) does not qualify.   Lung Cancer Screening Referral:   Additional Screening:  Hepatitis C Screening: does not qualify;   Vision Screening: Recommended annual ophthalmology exams for early detection of glaucoma and other disorders of the eye. Is the patient up to date with their annual eye exam?  Yes  Who is the provider or what is the name of the office in which the patient attends annual eye exams? Dr. Kathlen Mody If pt is not established with a provider, would they like to be referred to a provider to establish care? No .   Dental Screening: Recommended annual dental exams for proper oral hygiene  Community Resource Referral / Chronic Care Management: CRR required this visit?  No   CCM required this visit?  No      Plan:     I have personally reviewed and noted the following in the patient's chart:   Medical and social history Use of alcohol, tobacco or illicit drugs  Current medications and  supplements including opioid prescriptions.  Functional ability and status Nutritional status Physical activity Advanced directives List of other physicians Hospitalizations, surgeries, and ER visits in previous 12 months Vitals Screenings to include cognitive, depression, and falls Referrals and appointments  In addition, I have reviewed and discussed with patient certain preventive protocols, quality metrics, and best practice recommendations. A written personalized care plan for preventive services as well as general preventive health recommendations were provided to patient.     Leroy Kennedy, LPN   [id number]   Nurse  Notes:    Medical screening examination/treatment/procedure(s) were performed by non-physician practitioner and as supervising physician I was immediately available for consultation/collaboration.  I agree with above assessment and plan.  Electronically Signed by: Howard Pouch, DO [facility]

## 2020-10-22 NOTE — Patient Instructions (Signed)
[name] , Thank you for taking time to come for your Medicare Wellness Visit. I appreciate your ongoing commitment to your health goals. Please review the following plan we discussed and let me know if I can assist you in the future.   Screening recommendations/referrals: Colonoscopy: no longer required Mammogram: no longer required Bone Density: up to date (per patient) Recommended yearly ophthalmology/optometry visit for glaucoma screening and checkup Recommended yearly dental visit for hygiene and checkup  Vaccinations: Influenza vaccine: up to date Pneumococcal vaccine: Education provided Tdap vaccine: Education provided Shingles vaccine: up to date    Advanced directives: copy requested  Conditions/risks identified:   Next appointment:    Preventive Care 85 Years and Older, Female Preventive care refers to lifestyle choices and visits with your health care provider that can promote health and wellness. What does preventive care include? A yearly physical exam. This is also called an annual well check. Dental exams once or twice a year. Routine eye exams. Ask your health care provider how often you should have your eyes checked. Personal lifestyle choices, including: Daily care of your teeth and gums. Regular physical activity. Eating a healthy diet. Avoiding tobacco and drug use. Limiting alcohol use. Practicing safe sex. Taking low-dose aspirin every day. Taking vitamin and mineral supplements as recommended by your health care provider. What happens during an annual well check? The services and screenings done by your health care provider during your annual well check will depend on your age, overall health, lifestyle risk factors, and family history of disease. Counseling  Your health care provider may ask you questions about your: Alcohol use. Tobacco use. Drug use. Emotional well-being. Home and relationship well-being. Sexual activity. Eating  habits. History of falls. Memory and ability to understand (cognition). Work and work Statistician. Reproductive health. Screening  You may have the following tests or measurements: Height, weight, and BMI. Blood pressure. Lipid and cholesterol levels. These may be checked every 5 years, or more frequently if you are over 85 years old. Skin check. Lung cancer screening. You may have this screening every year starting at age 85 if you have a 30-pack-year history of smoking and currently smoke or have quit within the past 15 years. Fecal occult blood test (FOBT) of the stool. You may have this test every year starting at age 85. Flexible sigmoidoscopy or colonoscopy. You may have a sigmoidoscopy every 5 years or a colonoscopy every 10 years starting at age 85. Hepatitis C blood test. Hepatitis B blood test. Sexually transmitted disease (STD) testing. Diabetes screening. This is done by checking your blood sugar (glucose) after you have not eaten for a while (fasting). You may have this done every 1-3 years. Bone density scan. This is done to screen for osteoporosis. You may have this done starting at age 85. Mammogram. This may be done every 1-2 years. Talk to your health care provider about how often you should have regular mammograms. Talk with your health care provider about your test results, treatment options, and if necessary, the need for more tests. Vaccines  Your health care provider may recommend certain vaccines, such as: Influenza vaccine. This is recommended every year. Tetanus, diphtheria, and acellular pertussis (Tdap, Td) vaccine. You may need a Td booster every 10 years. Zoster vaccine. You may need this after age 85. Pneumococcal 13-valent conjugate (PCV13) vaccine. One dose is recommended after age 85. Pneumococcal polysaccharide (PPSV23) vaccine. One dose is recommended after age 85. Talk to your health care provider about which  screenings and vaccines you need and how  often you need them. This information is not intended to replace advice given to you by your health care provider. Make sure you discuss any questions you have with your health care provider. Document Released: 03/07/2015 Document Revised: 10/29/2015 Document Reviewed: 12/10/2014 Elsevier Interactive Patient Education  2017 [employer] Prevention in the Home Falls can cause injuries. They can happen to people of all ages. There are many things you can do to make your home safe and to help prevent falls. What can I do on the outside of my home? Regularly fix the edges of walkways and driveways and fix any cracks. Remove anything that might make you trip as you walk through a door, such as a raised step or threshold. Trim any bushes or trees on the path to your home. Use bright outdoor lighting. Clear any walking paths of anything that might make someone trip, such as rocks or tools. Regularly check to see if handrails are loose or broken. Make sure that both sides of any steps have handrails. Any raised decks and porches should have guardrails on the edges. Have any leaves, snow, or ice cleared regularly. Use sand or salt on walking paths during winter. Clean up any spills in your garage right away. This includes oil or grease spills. What can I do in the bathroom? Use night lights. Install grab bars by the toilet and in the tub and shower. Do not use towel bars as grab bars. Use non-skid mats or decals in the tub or shower. If you need to sit down in the shower, use a plastic, non-slip stool. Keep the floor dry. Clean up any water that spills on the floor as soon as it happens. Remove soap buildup in the tub or shower regularly. Attach bath mats securely with double-sided non-slip rug tape. Do not have throw rugs and other things on the floor that can make you trip. What can I do in the bedroom? Use night lights. Make sure that you have a light by your bed that is easy to  reach. Do not use any sheets or blankets that are too big for your bed. They should not hang down onto the floor. Have a firm chair that has side arms. You can use this for support while you get dressed. Do not have throw rugs and other things on the floor that can make you trip. What can I do in the kitchen? Clean up any spills right away. Avoid walking on wet floors. Keep items that you use a lot in easy-to-reach places. If you need to reach something above you, use a strong step stool that has a grab bar. Keep electrical cords out of the way. Do not use floor polish or wax that makes floors slippery. If you must use wax, use non-skid floor wax. Do not have throw rugs and other things on the floor that can make you trip. What can I do with my stairs? Do not leave any items on the stairs. Make sure that there are handrails on both sides of the stairs and use them. Fix handrails that are broken or loose. Make sure that handrails are as long as the stairways. Check any carpeting to make sure that it is firmly attached to the stairs. Fix any carpet that is loose or worn. Avoid having throw rugs at the top or bottom of the stairs. If you do have throw rugs, attach them to the floor with carpet  tape. Make sure that you have a light switch at the top of the stairs and the bottom of the stairs. If you do not have them, ask someone to add them for you. What else can I do to help prevent falls? Wear shoes that: Do not have high heels. Have rubber bottoms. Are comfortable and fit you well. Are closed at the toe. Do not wear sandals. If you use a stepladder: Make sure that it is fully opened. Do not climb a closed stepladder. Make sure that both sides of the stepladder are locked into place. Ask someone to hold it for you, if possible. Clearly mark and make sure that you can see: Any grab bars or handrails. First and last steps. Where the edge of each step is. Use tools that help you move  around (mobility aids) if they are needed. These include: Canes. Walkers. Scooters. Crutches. Turn on the lights when you go into a dark area. Replace any light bulbs as soon as they burn out. Set up your furniture so you have a clear path. Avoid moving your furniture around. If any of your floors are uneven, fix them. If there are any pets around you, be aware of where they are. Review your medicines with your doctor. Some medicines can make you feel dizzy. This can increase your chance of falling. Ask your doctor what other things that you can do to help prevent falls. This information is not intended to replace advice given to you by your health care provider. Make sure you discuss any questions you have with your health care provider. Document Released: 12/05/2008 Document Revised: 07/17/2015 Document Reviewed: 03/15/2014 Elsevier Interactive Patient Education  2017 [employer].

## 2020-11-12 NOTE — Progress Notes (Addendum)
[facility] Note  11/14/2020     CHIEF COMPLAINT Patient presents for Retina Follow Up   HISTORY OF PRESENT ILLNESS: Margaret [name] is a 87 y.o. female who presents to the clinic today for:   HPI     Retina Follow Up   Patient presents with  Dry AMD.  In both eyes.  This started weeks ago.  Severity is moderate.  Duration of weeks.  Since onset it is stable.  I, the attending physician,  performed the HPI with the patient and updated documentation appropriately.        Comments   Pt states her vision seems the same OU.  Pt complains of significant irritation and allergy symptoms OU--Pt has been using expired bottle of PF PRN.  Has used twice OU in the last 2 days.      Last edited by Bernarda Caffey, MD on 11/16/2020 11:03 PM.      Referring physician: Ma Hillock, DO [address] [place],  [place] [address]   HISTORICAL INFORMATION:   Selected notes from the MEDICAL RECORD NUMBER Self referral for macular degeneration LEE: 03.21.19 (Dr. Park Liter in [place], [place]) [BCVA: OD: 20/40 OS: 20/40] Ocular Hx-POAG, non-exu ARMD, DES, pseudo, YAG PMH-astham, arthritis,     CURRENT MEDICATIONS: Current Outpatient Medications (Ophthalmic Drugs)  Medication Sig   polyvinyl alcohol (LIQUIFILM TEARS) 1.4 % ophthalmic solution Place 1 drop into both eyes as needed for dry eyes.   No current facility-administered medications for this visit. (Ophthalmic Drugs)   Current Outpatient Medications (Other)  Medication Sig   albuterol (VENTOLIN HFA) 108 (90 Base) MCG/ACT inhaler Inhale 2 puffs into the lungs every 6 (six) hours as needed for wheezing or shortness of breath.   CALCIUM PO Take 1 tablet by mouth 2 (two) times daily. Slow release   folic acid (FOLVITE) 0.5 MG tablet Take 0.5 mg by mouth daily.   hydroxychloroquine (PLAQUENIL) 200 MG tablet Take 1 tablet (200 mg total) by mouth daily. Start on 1/28   lactase (LACTAID) 3000 units tablet Take 2 tablets  (6,000 Units total) by mouth 3 (three) times daily as needed (lactose intolerance).   levothyroxine (SYNTHROID) 50 MCG tablet Take 1 tablet (50 mcg total) by mouth daily before breakfast.   montelukast (SINGULAIR) 10 MG tablet Take 1 tablet (10 mg total) by mouth at bedtime.   Multiple Vitamins-Minerals (PRESERVISION AREDS 2) CAPS Take 1 capsule by mouth 2 (two) times daily.   predniSONE (DELTASONE) 5 MG tablet Take 5 mg by mouth daily as needed (joint/muscle pain).   pregabalin (LYRICA) 25 MG capsule Take 1 capsule (25 mg total) by mouth at bedtime.   No current facility-administered medications for this visit. (Other)   REVIEW OF SYSTEMS: ROS   Positive for: Musculoskeletal, Endocrine, Eyes Negative for: Constitutional, Gastrointestinal, Neurological, Skin, Genitourinary, HENT, Cardiovascular, Respiratory, Psychiatric, Allergic/Imm, Heme/Lymph Last edited by Doneen Poisson on 11/14/2020  1:42 PM.     ALLERGIES Allergies  Allergen Reactions   Augmentin [Amoxicillin-Pot Clavulanate] Swelling and Rash    Swelling in face   Lactose Intolerance (Gi)     PAST MEDICAL HISTORY Past Medical History:  Diagnosis Date   Allergy    Arthritis of left knee    Asthma    Carotid artery stenosis 10/17/2012   stable report in 2018- "moderate on right, minimal on left"   Carpal tunnel syndrome 2018   left.   Cervical radiculopathy 05/16/2015   DDD C4-C5, C5-C6,  and C6-C7.  Facet arthropathy throughout cervical spine.  Normal foraminal narrowing secondary to uncovertebral  arthropathy is seen bilaterally at C3-C4 and C5/C6   Chicken pox    DDD (degenerative disc disease), lumbar 04/26/2015   Mild levoscoliosis, right anterior listhesis of L4 and L5.  DDD at every level.  Facet arthropathy at multiple levels.   Deviated septum    Glaucoma    both eyes, mild opened angle   Heart murmur    History of colon polyps    History of fall    History of frequent urinary tract infections    klebs-  pansensitive. c. freundii - resitant to cefazolin and augementin   Hypothyroidism    Intra-abdominal abscess ([place]) 03/10/2020   Iron deficiency anemia    prior pcp told her to take Fe 325 QD   Macular degeneration    h/o retinal edema   Mitral valve prolapse 08/27/2011   Murmur, cardiac 10/17/2012   Osteoarthritis    Osteoporosis    Pneumonia    Positive TB test    In college    Pseudophakia of both eyes    PVD (peripheral vascular disease) ([place])    Rheumatic fever    87 years old    Syncope 05/17/2017   Thyroid disease    Urinary incontinence    Vertigo    had been prescribed antivert   Vitamin D deficiency    Past Surgical History:  Procedure Laterality Date   CARPAL TUNNEL RELEASE Bilateral 2019   CATARACT EXTRACTION Bilateral 1997   COLONOSCOPY  2010   NASAL SEPTUM SURGERY  2007   WRIST FRACTURE SURGERY Right 1996    FAMILY HISTORY Family History  Problem Relation Age of Onset   Arthritis Mother    COPD Mother    Prostate cancer Father    Arthritis Father    Hearing loss Father    Heart disease Father    Heart attack Father    Stroke Maternal Grandmother    Liver cancer Sister    Arthritis Brother    Prostate cancer Brother    COPD Brother    Heart disease Brother    Breast cancer Daughter    Blindness Maternal Grandfather    Stroke Maternal Grandfather    COPD Paternal Grandmother    Breast cancer Paternal Grandmother     SOCIAL HISTORY Social History   Tobacco Use   Smoking status: Former    Packs/day: 0.50    Types: Cigarettes    Start date: 1952    Quit date: 1965    Years since quitting: 57.7   Smokeless tobacco: Never  Vaping Use   Vaping Use: Never used  Substance Use Topics   Alcohol use: Not Currently    Alcohol/week: 5.0 standard drinks    Types: 5 Standard drinks or equivalent per week   Drug use: Never       OPHTHALMIC EXAM:  Base Eye Exam     Visual Acuity (Snellen - Linear)       Right Left   Dist cc 20/40 -2 20/30  -2   Dist ph cc NI NI    Correction: Glasses         Tonometry (Tonopen, 1:48 PM)       Right Left   Pressure 17 15         Pupils       Dark Light Shape React APD   Right 3 2 Round Brisk 0   Left 3 2  Round Brisk 0         Visual Fields       Left Right    Full Full         Extraocular Movement       Right Left    Full Full         Neuro/Psych     Oriented x3: Yes   Mood/Affect: Normal         Dilation     Both eyes: 1.0% Mydriacyl, 2.5% Phenylephrine @ 1:48 PM           Slit Lamp and Fundus Exam     Slit Lamp Exam       Right Left   Lids/Lashes Dermatochalasis - upper lid, Telangiectasia Dermatochalasis - upper lid, mild Meibomian gland dysfunction   Conjunctiva/Sclera Temporal Pinguecula White and quiet   Cornea Arcus, 1+ Punctate epithelial erosions, mild tear film debris Arcus, 1+ inferior Punctate epithelial erosions, mild tear film debris, decreased TBUT   Anterior Chamber Deep and quiet, narrow temporal angle Deep and quiet   Iris Round and dilated Round and dilated, pigmented mass 0730 behind iris, anterior to IOL   Lens PC IOL in good position with open PC PC IOL in good position with open PC   Vitreous Vitreous syneresis Vitreous syneresis         Fundus Exam       Right Left   Disc 360 Peripapillary atrophy, Sharp rim, mild Pallor Peripapillary atrophy, difficult to assess rim, mild Pallor   C/D Ratio 0.6 0.2   Macula Flat, Blunted foveal reflex, drusen, RPE mottling and clumping, new edema temporal fovea with punctate IRH, no SRH Flat, Blunted foveal reflex, drusen, RPE mottling and clumping, no heme or edema   Vessels Vascular attenuation, Tortuous Vascular attenuation   Periphery Attached, scattered Reticular degeneration, pigmented cystoid degeneration temporally, No heme  Attached, scattered Reticular degeneration, No heme            Refraction     Wearing Rx       Sphere Cylinder Axis Add   Right -0.75 +2.25  175 +2.75   Left -0.25 +1.25 165 +2.75            IMAGING AND PROCEDURES  Imaging and Procedures for @TODAY @  OCT, Retina - OU - Both Eyes       Right Eye Quality was good. Central Foveal Thickness: 401. Progression has worsened. Findings include no IRF, no SRF, retinal drusen , outer retinal atrophy, epiretinal membrane, abnormal foveal contour, intraretinal fluid, intraretinal hyper-reflective material (Interval development of IRF, IRHM temporal fovea, consistent with exu ARMD. Partial PVD).   Left Eye Quality was good. Central Foveal Thickness: 301. Progression has been stable. Findings include normal foveal contour, no IRF, no SRF, retinal drusen , outer retinal atrophy (Trace ERM).   Notes *Images captured and stored on drive  Diagnosis / Impression:  OD: Exudative ARMD - Interval development of IRF, IRHM temporal fovea, consistent with conversion to exu ARMD OS: Nonexudative ARMD OS Partial PVD OU  Clinical management:  See below  Abbreviations: NFP - Normal foveal profile. CME - cystoid macular edema. PED - pigment epithelial detachment. IRF - intraretinal fluid. SRF - subretinal fluid. EZ - ellipsoid zone. ERM - epiretinal membrane. ORA - outer retinal atrophy. ORT - outer retinal tubulation. SRHM - subretinal hyper-reflective material       Intravitreal Injection, Pharmacologic Agent - OD - Right Eye       Time Out  11/14/2020. 2:17 PM. Confirmed correct patient, procedure, site, and patient consented.   Anesthesia Topical anesthesia was used. Anesthetic medications included Lidocaine 2%, Proparacaine 0.5%.   Procedure Preparation included 5% betadine to ocular surface, eyelid speculum. A supplied needle was used.   Injection: 1.25 mg Bevacizumab 1.25mg /0.23ml   Route: Intravitreal, Site: Right Eye   NDC: H061816, Lot: [id number] , Expiration date: 01/07/2021, Waste: 0 mL   Post-op Post injection exam found visual acuity of at least counting fingers.  The patient tolerated the procedure well. There were no complications. The patient received written and verbal post procedure care education. Post injection medications were not given.            ASSESSMENT/PLAN:    ICD-10-CM   1. Exudative age-related macular degeneration of right eye with active choroidal neovascularization (HCC)  H35.3211 Intravitreal Injection, Pharmacologic Agent - OD - Right Eye    Bevacizumab (AVASTIN) SOLN 1.25 mg    2. 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 Long-term use of Plaquenil  Z79.899     4. Retinal edema  H35.81 OCT, Retina - OU - Both Eyes    5. Pseudophakia of both eyes  Z96.1     6. Primary open angle glaucoma (POAG) of both eyes, mild stage  H40.1131     7. Dry eyes  H04.123      1,2. Exudative age related macular degeneration, OS  - conversion from nonexudative ARMD noted on 9.23.22 visit - The incidence pathology and anatomy of wet AMD discussed  - discussed treatment options including observation vs intravitreal anti-VEGF agents such as Avastin, Lucentis, Eylea.   - Risks of endophthalmitis and vascular occlusive events and atrophic changes discussed with patient - BCVA 20/40 from 20/30 - OCT shows OD: Interval development of IRF, IRHM temporal fovea, consistent with exu ARMD. Partial PVD - recommend IVA OD #1 today, 09.23.22. - pt wishes to be treated with IVA - RBA of procedure discussed, questions answered - informed consent obtained and signed - see procedure note - f/u in 4 wks -- DFE/OCT, possible injection   3. Age related macular degeneration, non-exudative OS  - intermediate stage-no change from previous visit  - The incidence, anatomy, and pathology of dry AMD, risk of progression, and the AREDS and AREDS 2 study including smoking risks discussed with patient.   - Recommend amsler grid monitoring  4. Plaquenil use for RA  - baseline testing done 05.20.20 -- started  plaquenil early May 2020 for RA  - HVF 10-2 showed non-specific defects OU -- essentially normal baseline test  - OCT shows no obvious plaquenil-related changes, but interval development of exudative ARMD OD as above  - currently taking 200mg  / day per Dr. June 2020 instructions --> 3.29 mg/kg body wt per day  - tapering po prednisone, currently on 2.5mg  every 3 days  - and is also now on 10mg  po MTX weekly + folic acid  - the American Academy of Ophthalmology recommends dosing 5mg /kg per day to reduce risk of retinal toxicity   - recommend consideration of using alternate medication for RA especially given co-morbidity of age-related macular degeneration OU  - managed by Dr. Audelia Hives at [facility]   5. Pseudophakia OU  - s/p CE/IOL w/ ?canaloplasty OU by [facility] at [facility]  - doing well  - monitor   6. History of Glaucoma-   - not currently on gtts -- IOP 15 OU  - ?underwent  canaloplasty in conjunction with CEIOL at [facility] clinic   - referred to and now under the expert care of Dr. Kathlen Mody -- initial visit/consult 10.3.2019  7. Dry Eye Syndrome  -use AT's TID OU  Ophthalmic Meds Ordered this visit:  Meds ordered this encounter  Medications   Bevacizumab (AVASTIN) SOLN 1.25 mg      Return in about 4 weeks (around 12/12/2020) for 4 weeks exu ARMD OD, DFE, OCT.  There are no Patient Instructions on file for this visit.   Explained the diagnoses, plan, and follow up with the patient and they expressed understanding.  Patient expressed understanding of the importance of proper follow up care.    This document serves as a record of services personally performed by Gardiner Sleeper, MD, PhD. It was created on their behalf by Leonie Douglas, an ophthalmic technician. The creation of this record is the provider's dictation and/or activities during the visit.    Electronically signed by: Leonie Douglas COA, 11/16/20  11:23 PM   Gardiner Sleeper, M.D.,  Ph.D. Diseases & Surgery of the [facility] [place] 11/14/2020  I have reviewed the above documentation for accuracy and completeness, and I agree with the above. Gardiner Sleeper, M.D., Ph.D. 11/16/20 11:31 PM  Abbreviations: M myopia (nearsighted); A astigmatism; H hyperopia (farsighted); P presbyopia; Mrx spectacle prescription;  CTL contact lenses; OD right eye; OS left eye; OU both eyes  XT exotropia; ET esotropia; PEK punctate epithelial keratitis; PEE punctate epithelial erosions; DES dry eye syndrome; MGD meibomian gland dysfunction; ATs artificial tears; PFAT's preservative free artificial tears; [place] nuclear sclerotic cataract; PSC posterior subcapsular cataract; ERM epi-retinal membrane; PVD posterior vitreous detachment; RD retinal detachment; DM diabetes mellitus; DR diabetic retinopathy; NPDR non-proliferative diabetic retinopathy; PDR proliferative diabetic retinopathy; CSME clinically significant macular edema; DME diabetic macular edema; dbh dot blot hemorrhages; CWS cotton wool spot; POAG primary open angle glaucoma; C/D cup-to-disc ratio; HVF humphrey visual field; GVF goldmann visual field; OCT optical coherence tomography; IOP intraocular pressure; BRVO Branch retinal vein occlusion; CRVO central retinal vein occlusion; CRAO central retinal artery occlusion; BRAO branch retinal artery occlusion; RT retinal tear; SB scleral buckle; PPV pars plana vitrectomy; VH Vitreous hemorrhage; PRP panretinal laser photocoagulation; IVK intravitreal kenalog; VMT vitreomacular traction; MH Macular hole;  NVD neovascularization of the disc; NVE neovascularization elsewhere; AREDS age related eye disease study; ARMD age related macular degeneration; POAG primary open angle glaucoma; EBMD epithelial/anterior basement membrane dystrophy; ACIOL anterior chamber intraocular lens; IOL intraocular lens; PCIOL posterior chamber intraocular lens; Phaco/IOL phacoemulsification  with intraocular lens placement; [place] photorefractive keratectomy; LASIK laser assisted in situ keratomileusis; HTN hypertension; DM diabetes mellitus; COPD chronic obstructive pulmonary disease

## 2020-11-14 ENCOUNTER — Ambulatory Visit (INDEPENDENT_AMBULATORY_CARE_PROVIDER_SITE_OTHER): Payer: Medicare HMO | Admitting: Ophthalmology

## 2020-11-14 ENCOUNTER — Other Ambulatory Visit: Payer: Self-pay

## 2020-11-14 DIAGNOSIS — H353122 Nonexudative age-related macular degeneration, left eye, intermediate dry stage: Secondary | ICD-10-CM | POA: Diagnosis not present

## 2020-11-14 DIAGNOSIS — H04123 Dry eye syndrome of bilateral lacrimal glands: Secondary | ICD-10-CM

## 2020-11-14 DIAGNOSIS — H401131 Primary open-angle glaucoma, bilateral, mild stage: Secondary | ICD-10-CM

## 2020-11-14 DIAGNOSIS — H353211 Exudative age-related macular degeneration, right eye, with active choroidal neovascularization: Secondary | ICD-10-CM

## 2020-11-14 DIAGNOSIS — H353132 Nonexudative age-related macular degeneration, bilateral, intermediate dry stage: Secondary | ICD-10-CM

## 2020-11-14 DIAGNOSIS — H3581 Retinal edema: Secondary | ICD-10-CM

## 2020-11-14 DIAGNOSIS — Z79899 Other long term (current) drug therapy: Secondary | ICD-10-CM

## 2020-11-14 DIAGNOSIS — Z961 Presence of intraocular lens: Secondary | ICD-10-CM

## 2020-11-14 DIAGNOSIS — H353210 Exudative age-related macular degeneration, right eye, stage unspecified: Secondary | ICD-10-CM

## 2020-11-16 ENCOUNTER — Encounter (INDEPENDENT_AMBULATORY_CARE_PROVIDER_SITE_OTHER): Payer: Self-pay | Admitting: Ophthalmology

## 2020-11-16 MED ORDER — BEVACIZUMAB CHEMO INJECTION 1.25MG/0.05ML SYRINGE FOR KALEIDOSCOPE
1.2500 mg | INTRAVITREAL | Status: AC | PRN
  Administered 2020-11-14: 1.25 mg via INTRAVITREAL

## 2020-12-09 ENCOUNTER — Other Ambulatory Visit: Payer: Self-pay

## 2020-12-09 ENCOUNTER — Encounter: Payer: Self-pay | Admitting: Family Medicine

## 2020-12-09 ENCOUNTER — Ambulatory Visit (INDEPENDENT_AMBULATORY_CARE_PROVIDER_SITE_OTHER): Payer: Medicare HMO | Admitting: Family Medicine

## 2020-12-09 VITALS — BP 122/67 | HR 73 | Temp 97.8°F | Ht 60.0 in | Wt 133.0 lb

## 2020-12-09 DIAGNOSIS — G4762 Sleep related leg cramps: Secondary | ICD-10-CM

## 2020-12-09 DIAGNOSIS — J302 Other seasonal allergic rhinitis: Secondary | ICD-10-CM

## 2020-12-09 DIAGNOSIS — M069 Rheumatoid arthritis, unspecified: Secondary | ICD-10-CM | POA: Diagnosis not present

## 2020-12-09 DIAGNOSIS — M810 Age-related osteoporosis without current pathological fracture: Secondary | ICD-10-CM | POA: Diagnosis not present

## 2020-12-09 DIAGNOSIS — J4520 Mild intermittent asthma, uncomplicated: Secondary | ICD-10-CM

## 2020-12-09 DIAGNOSIS — Z23 Encounter for immunization: Secondary | ICD-10-CM

## 2020-12-09 DIAGNOSIS — E559 Vitamin D deficiency, unspecified: Secondary | ICD-10-CM

## 2020-12-09 DIAGNOSIS — M353 Polymyalgia rheumatica: Secondary | ICD-10-CM

## 2020-12-09 DIAGNOSIS — E039 Hypothyroidism, unspecified: Secondary | ICD-10-CM | POA: Diagnosis not present

## 2020-12-09 DIAGNOSIS — G2581 Restless legs syndrome: Secondary | ICD-10-CM

## 2020-12-09 MED ORDER — PREGABALIN 25 MG PO CAPS: 25.0000 mg | ORAL_CAPSULE | Freq: Every day | ORAL | 1 refills | Status: DC

## 2020-12-09 NOTE — Progress Notes (Signed)
This visit occurred during the SARS-CoV-2 public health emergency.  Safety protocols were in place, including screening questions prior to the visit, additional usage of staff PPE, and extensive cleaning of exam room while observing appropriate contact time as indicated for disinfecting solutions.    Margaret [name] , 1929/03/23, 87 y.o., female MRN: [id number] Patient Care Team    Relationship Specialty Notifications Start End  Ma Hillock, DO PCP - General Family Medicine  03/08/19   Bernarda Caffey, MD Consulting Physician Ophthalmology  03/29/18   Konrad Felix, MD Consulting Physician Ophthalmology  03/29/18   Devra Dopp, MD Referring Physician Dermatology  08/02/19   Valinda Party, MD  Rheumatology  08/02/19     Chief Complaint  Patient presents with   Muscle Pain    Pt c/o leg ache hs and cramps in the morning has been improving within the last 2 mos;      Subjective: Pt presents for an OV for follow up in [facility] PMR/RA: Patient reports she is much improved from when she last saw provider.  She is established with rheumatology.  She is on low-dose prednisone as needed.  She has not been taking methotrexate. Prior note: Patient presents today to discuss her arthralgias and myalgias.  Symptoms are worsening.  She reports increased symptoms in her left shoulder her left hand and her left hip.  Her symptoms are bilateral, worse on the left side. Her CMP, CRP, ESR, TSH, CBC and Lyme titers were all normal. Her ANA was positive, 1:640 nuclear homogenous. CCP and RF normal.  Hypothyroidism, unspecified type Patient reports compliance with levothyroxine 50 mcg daily.  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 Prior note: Patient reports he had forgotten about the Mirapex she had been on.  She would like to restart this medication.  She has been prescribed Mirapex 0.25 mg  nightly by her prior PCP for nocturnal leg cramping.  She feels this has worked well for her.  Seasonal allergies/Mild intermittent asthma, unspecified whether complicated Patient reports she has asthma and allergies.    Patient reports compliance with Singulair and her symptoms are well controlled.  She also uses Flonase and albuterol as needed.  She feels her symptoms are well controlled. Depression screen [facility] 2/9 12/09/2020 10/22/2020 03/19/2020 03/29/2018  Decreased Interest 0 0 - 0  Down, Depressed, Hopeless 0 0 0 0  PHQ - 2 Score 0 0 0 0    Allergies  Allergen Reactions   Augmentin [Amoxicillin-Pot Clavulanate] Swelling and Rash    Swelling in face   Lactose Intolerance (Gi)    Social History   Social History Narrative   Marital status/children/pets: Widowed.  Moved to [place] from [place] area) 2019.  She shares a home with 1 of her daughters.  Has 1 son and 2 daughters   Education/employment: Buyer, retail of arts degree, retired Marine scientist:      -smoke alarm in the home:Yes     - wears seatbelt: Yes     - Feels safe in their relationships: Yes      2 caffeinated beverages a day no alcohol tobacco or drug use   Past Medical History:  Diagnosis Date   Allergy    Arthritis of left knee    Asthma    Carotid artery stenosis 10/17/2012   stable report in 2018- "moderate on right, minimal  on left"   Carpal tunnel syndrome 2018   left.   Cervical radiculopathy 05/16/2015   DDD C4-C5, C5-C6, and C6-C7.  Facet arthropathy throughout cervical spine.  Normal foraminal narrowing secondary to uncovertebral  arthropathy is seen bilaterally at C3-C4 and C5/C6   Chicken pox    DDD (degenerative disc disease), lumbar 04/26/2015   Mild levoscoliosis, right anterior listhesis of L4 and L5.  DDD at every level.  Facet arthropathy at multiple levels.   Deviated septum    Glaucoma    both eyes, mild opened angle   Heart murmur    History of colon polyps    History of  fall    History of frequent urinary tract infections    klebs- pansensitive. c. freundii - resitant to cefazolin and augementin   Hypothyroidism    Intra-abdominal abscess ([place]) 03/10/2020   Iron deficiency anemia    prior pcp told her to take Fe 325 QD   Macular degeneration    h/o retinal edema   Mitral valve prolapse 08/27/2011   Murmur, cardiac 10/17/2012   Osteoarthritis    Osteoporosis    Pneumonia    Positive TB test    In college    Pseudophakia of both eyes    PVD (peripheral vascular disease) ([place])    Rheumatic fever    87 years old    Syncope 05/17/2017   Thyroid disease    Urinary incontinence    Vertigo    had been prescribed antivert   Vitamin D deficiency    Past Surgical History:  Procedure Laterality Date   CARPAL TUNNEL RELEASE Bilateral 2019   CATARACT EXTRACTION Bilateral 1997   COLONOSCOPY  2010   NASAL SEPTUM SURGERY  2007   WRIST FRACTURE SURGERY Right 1996   Family History  Problem Relation Age of Onset   Arthritis Mother    COPD Mother    Prostate cancer Father    Arthritis Father    Hearing loss Father    Heart disease Father    Heart attack Father    Stroke Maternal Grandmother    Liver cancer Sister    Arthritis Brother    Prostate cancer Brother    COPD Brother    Heart disease Brother    Breast cancer Daughter    Blindness Maternal Grandfather    Stroke Maternal Grandfather    COPD Paternal Grandmother    Breast cancer Paternal Grandmother    Allergies as of 12/09/2020       Reactions   Augmentin [amoxicillin-pot Clavulanate] Swelling, Rash   Swelling in face   Lactose Intolerance (gi)         Medication List        Accurate as of December 09, 2020  4:58 PM. If you have any questions, ask your nurse or doctor.          STOP taking these medications    hydroxychloroquine 200 MG tablet Commonly known as: PLAQUENIL Stopped by: Howard Pouch, DO       TAKE these medications    albuterol 108 (90 Base) MCG/ACT  inhaler Commonly known as: VENTOLIN HFA Inhale 2 puffs into the lungs every 6 (six) hours as needed for wheezing or shortness of breath.   CALCIUM PO Take 1 tablet by mouth 2 (two) times daily. Slow release   folic acid 0.5 MG tablet Commonly known as: FOLVITE Take 0.5 mg by mouth daily.   lactase 3000 units tablet Commonly known as: LACTAID Take 2 tablets (  6,000 Units total) by mouth 3 (three) times daily as needed (lactose intolerance).   levothyroxine 50 MCG tablet Commonly known as: SYNTHROID Take 1 tablet (50 mcg total) by mouth daily before breakfast.   montelukast 10 MG tablet Commonly known as: SINGULAIR Take 1 tablet (10 mg total) by mouth at bedtime.   polyvinyl alcohol 1.4 % ophthalmic solution Commonly known as: LIQUIFILM TEARS Place 1 drop into both eyes as needed for dry eyes.   predniSONE 5 MG tablet Commonly known as: DELTASONE Take 5 mg by mouth daily as needed (joint/muscle pain).   pregabalin 25 MG capsule Commonly known as: Lyrica Take 1 capsule (25 mg total) by mouth at bedtime.   PreserVision AREDS 2 Caps Take 1 capsule by mouth 2 (two) times daily.        All past medical history, surgical history, allergies, family history, immunizations andmedications were updated in the EMR today and reviewed under the history and medication portions of their EMR.     ROS: Negative, with the exception of above mentioned in HPI   Objective:  BP 122/67   Pulse 73   Temp 97.8 F (36.6 C) (Oral)   Ht 5' (1.524 m)   Wt 133 lb (60.3 kg)   SpO2 99%   BMI 25.97 kg/m  Body mass index is 25.97 kg/m. Gen: Afebrile. No acute distress.  Nontoxic, very pleasant female. HENT: AT. [place].  No cough.  No hoarseness. Eyes:Pupils Equal Round Reactive to light, Extraocular movements intact,  Conjunctiva without redness, discharge or icterus. Neck/lymp/endocrine: Supple, no lymphadenopathy, no thyromegaly CV: RRR 1/6 systolic murmur, no edema Chest: CTAB, no wheeze or  crackles Skin: no rashes, purpura or petechiae.  Neuro: Normal gait. PERLA. EOMi. Alert. Oriented x3 Psych: Normal affect, dress and demeanor. Normal speech. Normal thought content and judgment.   No results found. No results found. No results found for this or any previous visit (from the past 24 hour(s)).  Assessment/Plan: Diamond Jentz is a 87 y.o. female present for OV for  Hypothyroidism, unspecified type Stable. Continue levothyroxine 50 mcg daily.   Labs due next visit  Vit d def/osteoporosis:  prolia injections are provided by rheumatology.  Vitamin D levels and PTH and calcium levels UTD  Rheumatoid arthritis/PMR: - Now established with rheumatology and prescribed prednisone. Her symptoms are greatly improved. Continue routine follow-ups with rheumatology  Nocturnal leg cramps/RLS Much improved. Continue Lyrica 25 mg nightly Tried: Mirapex Magnesium, Iron, B12 levels collected today.  Seasonal allergies/Mild intermittent asthma, unspecified whether complicated Stable Continue Singulair  Continue albuterol as needed  Continue Flonase    Reviewed expectations re: course of current medical issues. Discussed self-management of symptoms. Outlined signs and symptoms indicating need for more acute intervention. Patient verbalized understanding and all questions were answered. Patient received an After-Visit Summary.    Orders Placed This Encounter  Procedures   Flu Vaccine QUAD High Dose(Fluad)   Pneumococcal conjugate vaccine 20-valent (Prevnar 20)    Meds ordered this encounter  Medications   pregabalin (LYRICA) 25 MG capsule    Sig: Take 1 capsule (25 mg total) by mouth at bedtime.    Dispense:  90 capsule    Refill:  1     Referral Orders  No referral(s) requested today     Note is dictated utilizing voice recognition software. Although note has been proof read prior to signing, occasional typographical errors still can be missed. If any  questions arise, please do not hesitate to call for verification.  electronically signed by:  Howard Pouch, DO  [place]

## 2020-12-09 NOTE — Patient Instructions (Signed)
  Great to see you today.  I have refilled the medication(s) we provide.   If labs were collected, we will inform you of lab results once received either by echart message or telephone call.   - echart message- for normal results that have been seen by the patient already.   - telephone call: abnormal results or if patient has not viewed results in their echart.   Next appt first week of April.

## 2020-12-10 NOTE — Progress Notes (Signed)
[facility] Note  12/12/2020     CHIEF COMPLAINT Patient presents for Retina Follow Up   HISTORY OF PRESENT ILLNESS: Margaret [name] is a 87 y.o. female who presents to the clinic today for:   HPI     Retina Follow Up   Patient presents with  Wet AMD.  In left eye.  Duration of 4 weeks.  Since onset it is stable.  I, the attending physician,  performed the HPI with the patient and updated documentation appropriately.        Comments   4 week follow up Exu ARMD OS-  Sunlight really bothers her eyes.  Vision appears stable OU.       Last edited by Bernarda Caffey, MD on 12/12/2020  4:11 PM.     Referring physician: Lezlie Lye, MD [place],  [place] [address]  HISTORICAL INFORMATION:  Selected notes from the MEDICAL RECORD NUMBER Self referral for macular degeneration LEE: 03.21.19 (Dr. Park Liter in [place], [place]) [BCVA: OD: 20/40 OS: 20/40] Ocular Hx-POAG, non-exu ARMD, DES, pseudo, YAG PMH-astham, arthritis,    CURRENT MEDICATIONS: Current Outpatient Medications (Ophthalmic Drugs)  Medication Sig   polyvinyl alcohol (LIQUIFILM TEARS) 1.4 % ophthalmic solution Place 1 drop into both eyes as needed for dry eyes.   No current facility-administered medications for this visit. (Ophthalmic Drugs)   Current Outpatient Medications (Other)  Medication Sig   albuterol (VENTOLIN HFA) 108 (90 Base) MCG/ACT inhaler Inhale 2 puffs into the lungs every 6 (six) hours as needed for wheezing or shortness of breath.   CALCIUM PO Take 1 tablet by mouth 2 (two) times daily. Slow release   folic acid (FOLVITE) 0.5 MG tablet Take 0.5 mg by mouth daily.   lactase (LACTAID) 3000 units tablet Take 2 tablets (6,000 Units total) by mouth 3 (three) times daily as needed (lactose intolerance).   levothyroxine (SYNTHROID) 50 MCG tablet Take 1 tablet (50 mcg total) by mouth daily before breakfast.   montelukast (SINGULAIR) 10 MG tablet Take 1 tablet (10 mg total)  by mouth at bedtime.   Multiple Vitamins-Minerals (PRESERVISION AREDS 2) CAPS Take 1 capsule by mouth 2 (two) times daily.   predniSONE (DELTASONE) 5 MG tablet Take 5 mg by mouth daily as needed (joint/muscle pain).   pregabalin (LYRICA) 25 MG capsule Take 1 capsule (25 mg total) by mouth at bedtime.   No current facility-administered medications for this visit. (Other)   REVIEW OF SYSTEMS: ROS   Positive for: Musculoskeletal, Endocrine, Eyes Negative for: Constitutional, Gastrointestinal, Neurological, Skin, Genitourinary, HENT, Cardiovascular, Respiratory, Psychiatric, Allergic/Imm, Heme/Lymph Last edited by Leonie Douglas, COA on 12/12/2020  1:36 PM.    ALLERGIES Allergies  Allergen Reactions   Augmentin [Amoxicillin-Pot Clavulanate] Swelling and Rash    Swelling in face   Lactose Intolerance (Gi)    PAST MEDICAL HISTORY Past Medical History:  Diagnosis Date   Allergy    Arthritis of left knee    Asthma    Carotid artery stenosis 10/17/2012   stable report in 2018- "moderate on right, minimal on left"   Carpal tunnel syndrome 2018   left.   Cervical radiculopathy 05/16/2015   DDD C4-C5, C5-C6, and C6-C7.  Facet arthropathy throughout cervical spine.  Normal foraminal narrowing secondary to uncovertebral  arthropathy is seen bilaterally at C3-C4 and C5/C6   Chicken pox    DDD (degenerative disc disease), lumbar 04/26/2015   Mild levoscoliosis, right anterior listhesis of L4 and L5.  DDD  at every level.  Facet arthropathy at multiple levels.   Deviated septum    Glaucoma    both eyes, mild opened angle   Heart murmur    History of colon polyps    History of fall    History of frequent urinary tract infections    klebs- pansensitive. c. freundii - resitant to cefazolin and augementin   Hypothyroidism    Intra-abdominal abscess ([place]) 03/10/2020   Iron deficiency anemia    prior pcp told her to take Fe 325 QD   Macular degeneration    h/o retinal edema   Mitral valve  prolapse 08/27/2011   Murmur, cardiac 10/17/2012   Osteoarthritis    Osteoporosis    Pneumonia    Positive TB test    In college    Pseudophakia of both eyes    PVD (peripheral vascular disease) ([place])    Rheumatic fever    87 years old    Syncope 05/17/2017   Thyroid disease    Urinary incontinence    Vertigo    had been prescribed antivert   Vitamin D deficiency    Past Surgical History:  Procedure Laterality Date   CARPAL TUNNEL RELEASE Bilateral 2019   CATARACT EXTRACTION Bilateral 1997   COLONOSCOPY  2010   NASAL SEPTUM SURGERY  2007   WRIST FRACTURE SURGERY Right 1996   FAMILY HISTORY Family History  Problem Relation Age of Onset   Arthritis Mother    COPD Mother    Prostate cancer Father    Arthritis Father    Hearing loss Father    Heart disease Father    Heart attack Father    Stroke Maternal Grandmother    Liver cancer Sister    Arthritis Brother    Prostate cancer Brother    COPD Brother    Heart disease Brother    Breast cancer Daughter    Blindness Maternal Grandfather    Stroke Maternal Grandfather    COPD Paternal Grandmother    Breast cancer Paternal Grandmother     SOCIAL HISTORY Social History   Tobacco Use   Smoking status: Former    Packs/day: 0.50    Types: Cigarettes    Start date: 1952    Quit date: 1965    Years since quitting: 57.8   Smokeless tobacco: Never  Vaping Use   Vaping Use: Never used  Substance Use Topics   Alcohol use: Not Currently    Alcohol/week: 5.0 standard drinks    Types: 5 Standard drinks or equivalent per week   Drug use: Never       OPHTHALMIC EXAM:  Base Eye Exam     Visual Acuity (Snellen - Linear)       Right Left   Dist cc 20/40 -2 20/30 -2   Dist ph cc 20/40 +2 NI    Correction: Glasses         Tonometry (Tonopen, 1:43 PM)       Right Left   Pressure 15 14         Pupils       Dark Light Shape React APD   Right 3 2 Round Brisk None   Left 3 2 Round Brisk None          Visual Fields (Counting fingers)       Left Right    Full Full         Extraocular Movement       Right Left    Full  Full         Neuro/Psych     Oriented x3: Yes   Mood/Affect: Normal         Dilation     Both eyes: 1.0% Mydriacyl, 2.5% Phenylephrine @ 1:43 PM           Slit Lamp and Fundus Exam     Slit Lamp Exam       Right Left   Lids/Lashes Dermatochalasis - upper lid, Telangiectasia Dermatochalasis - upper lid, mild Meibomian gland dysfunction   Conjunctiva/Sclera Temporal Pinguecula White and quiet   Cornea Arcus, 1+ Punctate epithelial erosions, mild tear film debris Arcus, 1+ inferior Punctate epithelial erosions, mild tear film debris, fine endo pigment   Anterior Chamber Deep and quiet, narrow temporal angle Deep and quiet   Iris Round and dilated Round and dilated, pigmented mass 0730 behind iris, anterior to IOL, smaller pigmented mass 0300 -- not previously seen    Lens PC IOL in good position with open PC PC IOL in good position with open PC   Vitreous Vitreous syneresis Vitreous syneresis         Fundus Exam       Right Left   Disc 360 Peripapillary atrophy, Sharp rim, mild Pallor Peripapillary atrophy, difficult to assess rim, mild Pallor   C/D Ratio 0.6 0.2   Macula Flat, Blunted foveal reflex, drusen, RPE mottling and clumping, temporal edema -- improved, punctate IRH -- resolved, no SRH,  Early RPE atrophy Flat, Blunted foveal reflex, drusen, RPE mottling and clumping, no heme or edema   Vessels Vascular attenuation, Tortuous Vascular attenuation   Periphery Attached, scattered Reticular degeneration, pigmented cystoid degeneration temporally, No heme  Attached, scattered Reticular degeneration, No heme            Refraction     Wearing Rx       Sphere Cylinder Axis Add   Right -0.75 +2.25 175 +2.75   Left -0.25 +1.25 165 +2.75           IMAGING AND PROCEDURES  Imaging and Procedures for @TODAY @  OCT, Retina - OU  - Both Eyes       Right Eye Quality was good. Central Foveal Thickness: 306. Progression has improved. Findings include no IRF, no SRF, retinal drusen , outer retinal atrophy, epiretinal membrane, intraretinal fluid, intraretinal hyper-reflective material, normal foveal contour (Interval improvement in IRF/IRHM/and foveal contour. Partial PVD).   Left Eye Quality was good. Central Foveal Thickness: 307. Progression has been stable. Findings include normal foveal contour, no IRF, no SRF, retinal drusen , outer retinal atrophy (Trace ERM).   Notes *Images captured and stored on drive  Diagnosis / Impression:  OD: Exudative ARMD - Interval improvement in IRF/IRHM/and foveal contour.   OS: Nonexudative ARMD OS Partial PVD OU  Clinical management:  See below  Abbreviations: NFP - Normal foveal profile. CME - cystoid macular edema. PED - pigment epithelial detachment. IRF - intraretinal fluid. SRF - subretinal fluid. EZ - ellipsoid zone. ERM - epiretinal membrane. ORA - outer retinal atrophy. ORT - outer retinal tubulation. SRHM - subretinal hyper-reflective material       Intravitreal Injection, Pharmacologic Agent - OD - Right Eye       Time Out 12/12/2020. 2:56 PM. Confirmed correct patient, procedure, site, and patient consented.   Anesthesia Topical anesthesia was used. Anesthetic medications included Lidocaine 2%, Proparacaine 0.5%.   Procedure Preparation included 5% betadine to ocular surface, eyelid speculum. A (32g) needle was used.  Injection: 1.25 mg Bevacizumab 1.25mg /0.67ml   Route: Intravitreal, Site: Right Eye   NDC: H061816, Lot: [id number], Expiration date: 02/10/2021, Waste: 0.05 mL   Post-op Post injection exam found visual acuity of at least counting fingers. The patient tolerated the procedure well. There were no complications. The patient received written and verbal post procedure care education. Post injection medications were not given.             ASSESSMENT/PLAN:    ICD-10-CM   1. Exudative age-related macular degeneration of right eye with active choroidal neovascularization (HCC)  H35.3211 Intravitreal Injection, Pharmacologic Agent - OD - Right Eye    Bevacizumab (AVASTIN) SOLN 1.25 mg    2. Retinal edema  H35.81 OCT, Retina - OU - Both Eyes    3. 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 Long-term use of Plaquenil  Z79.899     6. Pseudophakia of both eyes  Z96.1     7. Primary open angle glaucoma (POAG) of both eyes, mild stage  H40.1131     8. Dry eyes  H04.123     1,2. Exudative age related macular degeneration, OD  - conversion from nonexudative ARMD noted on 9.23.22 visit  - s/p IVA OD #1 (09.23.22) - BCVA 20/40+ (stable) - OCT shows [id number] improvement in IRF/IRHM/and foveal contour.  Partial PVD - recommend IVA OD #2 today, 10.21.22. - pt wishes to be treated with IVA - RBA of procedure discussed, questions answered - informed consent obtained and signed - see procedure note - f/u in 4 wks -- DFE/OCT, possible injection   3. Age related macular degeneration, non-exudative OS  - intermediate stage-no change from previous visit  - The incidence, anatomy, and pathology of dry AMD, risk of progression, and the AREDS and AREDS 2 study including smoking risks discussed with patient.   - Recommend amsler grid monitoring   4. Iris/ciliary body mass OS  - pigmented lesion behind iris but anterior to PCIOL at 0730 and 0300  - 0730 lesion spans 0630 to 0830 behind dilated iris and larger than 0300 lesion  - patient asymptomatic  - discussed findings with patient and daughter  - concern for CB melanoma  - referred to Dr. Daralene Milch, Ocular Oncologist, for further evaluation and management  - patient has an appt 12/23/2020 with Dr. Daralene Milch.  5. Plaquenil use for RA  - baseline testing done 05.20.20 -- started plaquenil early May  2020 for RA  - HVF 10-2 showed non-specific defects OU -- essentially normal baseline test  - OCT shows no obvious plaquenil-related changes, but interval development of exudative ARMD OD as above  - currently taking 200mg  / day per Dr. Audelia Hives instructions --> 3.29 mg/kg body wt per day  - po prednisone, currently as needed which is about once a week - and is also now on 10mg  po MTX weekly + folic acid  - the American Academy of Ophthalmology recommends dosing 5mg /kg per day to reduce risk of retinal toxicity   - recommend consideration of using alternate medication for RA especially given co-morbidity of age-related macular degeneration OU  - managed by Dr. Dossie Der at [facility]   6. Pseudophakia OU  - s/p CE/IOL w/ ?canaloplasty OU by Dr. Robyne Askew at [facility]  - doing well  - monitor   7. History of Glaucoma-   - not currently on gtts -- IOP 15, 14  - ?  underwent canaloplasty in conjunction with CEIOL at [facility]    - referred to and now under the expert care of Dr. Kathlen Mody -- initial visit/consult 10.3.2019  8. Dry Eye Syndrome  -use AT's TID OU  Ophthalmic Meds Ordered this visit:  Meds ordered this encounter  Medications   Bevacizumab (AVASTIN) SOLN 1.25 mg     Return for 4 week , DFE, OCT, possible injection.  There are no Patient Instructions on file for this visit.  Explained the diagnoses, plan, and follow up with the patient and they expressed understanding.  Patient expressed understanding of the importance of proper follow up care.   This document serves as a record of services personally performed by Gardiner Sleeper, MD, PhD. It was created on their behalf by Leonie Douglas, an ophthalmic technician. The creation of this record is the provider's dictation and/or activities during the visit.    Electronically signed by: Leonie Douglas COA, 12/12/20  9:37 PM  Gardiner Sleeper, M.D., Ph.D. Diseases & Surgery of the Retina and Vitreous [facility]  [place] 12/12/2020  I have reviewed the above documentation for accuracy and completeness, and I agree with the above. Gardiner Sleeper, M.D., Ph.D. 12/12/20 9:37 PM   Abbreviations: M myopia (nearsighted); A astigmatism; H hyperopia (farsighted); P presbyopia; Mrx spectacle prescription;  CTL contact lenses; OD right eye; OS left eye; OU both eyes  XT exotropia; ET esotropia; PEK punctate epithelial keratitis; PEE punctate epithelial erosions; DES dry eye syndrome; MGD meibomian gland dysfunction; ATs artificial tears; PFAT's preservative free artificial tears; [place] nuclear sclerotic cataract; PSC posterior subcapsular cataract; ERM epi-retinal membrane; PVD posterior vitreous detachment; RD retinal detachment; DM diabetes mellitus; DR diabetic retinopathy; NPDR non-proliferative diabetic retinopathy; PDR proliferative diabetic retinopathy; CSME clinically significant macular edema; DME diabetic macular edema; dbh dot blot hemorrhages; CWS cotton wool spot; POAG primary open angle glaucoma; C/D cup-to-disc ratio; HVF humphrey visual field; GVF goldmann visual field; OCT optical coherence tomography; IOP intraocular pressure; BRVO Branch retinal vein occlusion; CRVO central retinal vein occlusion; CRAO central retinal artery occlusion; BRAO branch retinal artery occlusion; RT retinal tear; SB scleral buckle; PPV pars plana vitrectomy; VH Vitreous hemorrhage; PRP panretinal laser photocoagulation; IVK intravitreal kenalog; VMT vitreomacular traction; MH Macular hole;  NVD neovascularization of the disc; NVE neovascularization elsewhere; AREDS age related eye disease study; ARMD age related macular degeneration; POAG primary open angle glaucoma; EBMD epithelial/anterior basement membrane dystrophy; ACIOL anterior chamber intraocular lens; IOL intraocular lens; PCIOL posterior chamber intraocular lens; Phaco/IOL phacoemulsification with intraocular lens placement; [place] photorefractive  keratectomy; LASIK laser assisted in situ keratomileusis; HTN hypertension; DM diabetes mellitus; COPD chronic obstructive pulmonary disease

## 2020-12-12 ENCOUNTER — Other Ambulatory Visit: Payer: Self-pay

## 2020-12-12 ENCOUNTER — Encounter (INDEPENDENT_AMBULATORY_CARE_PROVIDER_SITE_OTHER): Payer: Self-pay | Admitting: Ophthalmology

## 2020-12-12 ENCOUNTER — Ambulatory Visit (INDEPENDENT_AMBULATORY_CARE_PROVIDER_SITE_OTHER): Payer: Medicare HMO | Admitting: Ophthalmology

## 2020-12-12 DIAGNOSIS — H3581 Retinal edema: Secondary | ICD-10-CM

## 2020-12-12 DIAGNOSIS — D4989 Neoplasm of unspecified behavior of other specified sites: Secondary | ICD-10-CM | POA: Diagnosis not present

## 2020-12-12 DIAGNOSIS — H04123 Dry eye syndrome of bilateral lacrimal glands: Secondary | ICD-10-CM

## 2020-12-12 DIAGNOSIS — H353211 Exudative age-related macular degeneration, right eye, with active choroidal neovascularization: Secondary | ICD-10-CM

## 2020-12-12 DIAGNOSIS — Z961 Presence of intraocular lens: Secondary | ICD-10-CM

## 2020-12-12 DIAGNOSIS — Z79899 Other long term (current) drug therapy: Secondary | ICD-10-CM

## 2020-12-12 DIAGNOSIS — H353122 Nonexudative age-related macular degeneration, left eye, intermediate dry stage: Secondary | ICD-10-CM | POA: Diagnosis not present

## 2020-12-12 DIAGNOSIS — H401131 Primary open-angle glaucoma, bilateral, mild stage: Secondary | ICD-10-CM

## 2020-12-12 MED ORDER — BEVACIZUMAB CHEMO INJECTION 1.25MG/0.05ML SYRINGE FOR KALEIDOSCOPE
1.2500 mg | INTRAVITREAL | Status: AC | PRN
  Administered 2020-12-12: 1.25 mg via INTRAVITREAL

## 2021-01-07 NOTE — Progress Notes (Signed)
[facility] Note  01/09/2021     CHIEF COMPLAINT Patient presents for Retina Follow Up   HISTORY OF PRESENT ILLNESS: Margaret [name] is a 87 y.o. female who presents to the clinic today for:   HPI     Retina Follow Up   Patient presents with  Wet AMD.  In right eye.  This started 4 weeks ago.  I, the attending physician,  performed the HPI with the patient and updated documentation appropriately.        Comments   Patient here for 4 weeks retina follow up for exu ARMD OD. Patient states vision doing the same. There ate times OD becomes blurry but clears up. On occasion has eye pain.      Last edited by Bernarda Caffey, MD on 01/10/2021 12:13 AM.      Referring physician: Ma Hillock, DO [address] [place],  [place] [address]  HISTORICAL INFORMATION:  Selected notes from the MEDICAL RECORD NUMBER Self referral for macular degeneration LEE: 03.21.19 (Dr. Park Liter in [place], [place]) [BCVA: OD: 20/40 OS: 20/40] Ocular Hx-POAG, non-exu ARMD, DES, pseudo, YAG PMH-astham, arthritis,    CURRENT MEDICATIONS: Current Outpatient Medications (Ophthalmic Drugs)  Medication Sig   polyvinyl alcohol (LIQUIFILM TEARS) 1.4 % ophthalmic solution Place 1 drop into both eyes as needed for dry eyes.   No current facility-administered medications for this visit. (Ophthalmic Drugs)   Current Outpatient Medications (Other)  Medication Sig   albuterol (VENTOLIN HFA) 108 (90 Base) MCG/ACT inhaler Inhale 2 puffs into the lungs every 6 (six) hours as needed for wheezing or shortness of breath.   CALCIUM PO Take 1 tablet by mouth 2 (two) times daily. Slow release   folic acid (FOLVITE) 0.5 MG tablet Take 0.5 mg by mouth daily.   lactase (LACTAID) 3000 units tablet Take 2 tablets (6,000 Units total) by mouth 3 (three) times daily as needed (lactose intolerance).   levothyroxine (SYNTHROID) 50 MCG tablet Take 1 tablet (50 mcg total) by mouth daily before breakfast.    montelukast (SINGULAIR) 10 MG tablet Take 1 tablet (10 mg total) by mouth at bedtime.   Multiple Vitamins-Minerals (PRESERVISION AREDS 2) CAPS Take 1 capsule by mouth 2 (two) times daily.   predniSONE (DELTASONE) 5 MG tablet Take 5 mg by mouth daily as needed (joint/muscle pain).   pregabalin (LYRICA) 25 MG capsule Take 1 capsule (25 mg total) by mouth at bedtime.   No current facility-administered medications for this visit. (Other)   REVIEW OF SYSTEMS: ROS   Positive for: Musculoskeletal, Endocrine, Eyes Negative for: Constitutional, Gastrointestinal, Neurological, Skin, Genitourinary, HENT, Cardiovascular, Respiratory, Psychiatric, Allergic/Imm, Heme/Lymph Last edited by Theodore Demark, COA on 01/09/2021  1:41 PM.     ALLERGIES Allergies  Allergen Reactions   Augmentin [Amoxicillin-Pot Clavulanate] Swelling and Rash    Swelling in face   Lactose Intolerance (Gi)    PAST MEDICAL HISTORY Past Medical History:  Diagnosis Date   Allergy    Arthritis of left knee    Asthma    Carotid artery stenosis 10/17/2012   stable report in 2018- "moderate on right, minimal on left"   Carpal tunnel syndrome 2018   left.   Cervical radiculopathy 05/16/2015   DDD C4-C5, C5-C6, and C6-C7.  Facet arthropathy throughout cervical spine.  Normal foraminal narrowing secondary to uncovertebral  arthropathy is seen bilaterally at C3-C4 and C5/C6   Chicken pox    DDD (degenerative disc disease), lumbar 04/26/2015  Mild levoscoliosis, right anterior listhesis of L4 and L5.  DDD at every level.  Facet arthropathy at multiple levels.   Deviated septum    Glaucoma    both eyes, mild opened angle   Heart murmur    History of colon polyps    History of fall    History of frequent urinary tract infections    klebs- pansensitive. c. freundii - resitant to cefazolin and augementin   Hypothyroidism    Intra-abdominal abscess ([place]) 03/10/2020   Iron deficiency anemia    prior pcp told her to take Fe  325 QD   Macular degeneration    h/o retinal edema   Mitral valve prolapse 08/27/2011   Murmur, cardiac 10/17/2012   Osteoarthritis    Osteoporosis    Pneumonia    Positive TB test    In college    Pseudophakia of both eyes    PVD (peripheral vascular disease) ([place])    Rheumatic fever    87 years old    Syncope 05/17/2017   Thyroid disease    Urinary incontinence    Vertigo    had been prescribed antivert   Vitamin D deficiency    Past Surgical History:  Procedure Laterality Date   CARPAL TUNNEL RELEASE Bilateral 2019   CATARACT EXTRACTION Bilateral 1997   COLONOSCOPY  2010   NASAL SEPTUM SURGERY  2007   WRIST FRACTURE SURGERY Right 1996   FAMILY HISTORY Family History  Problem Relation Age of Onset   Arthritis Mother    COPD Mother    Prostate cancer Father    Arthritis Father    Hearing loss Father    Heart disease Father    Heart attack Father    Stroke Maternal Grandmother    Liver cancer Sister    Arthritis Brother    Prostate cancer Brother    COPD Brother    Heart disease Brother    Breast cancer Daughter    Blindness Maternal Grandfather    Stroke Maternal Grandfather    COPD Paternal Grandmother    Breast cancer Paternal Grandmother     SOCIAL HISTORY Social History   Tobacco Use   Smoking status: Former    Packs/day: 0.50    Types: Cigarettes    Start date: 1952    Quit date: 1965    Years since quitting: 57.9   Smokeless tobacco: Never  Vaping Use   Vaping Use: Never used  Substance Use Topics   Alcohol use: Not Currently    Alcohol/week: 5.0 standard drinks    Types: 5 Standard drinks or equivalent per week   Drug use: Never       OPHTHALMIC EXAM:  Base Eye Exam     Visual Acuity (Snellen - Linear)       Right Left   Dist cc 20/40 +2 20/30 -1   Dist ph cc NI NI    Correction: Glasses         Tonometry (Tonopen, 1:38 PM)       Right Left   Pressure 08 08         Pupils       Dark Light Shape React APD    Right 3 2 Round Brisk None   Left 3 2 Round Brisk None         Visual Fields (Counting fingers)       Left Right    Full Full         Extraocular Movement  Right Left    Full, Ortho Full, Ortho         Neuro/Psych     Oriented x3: Yes   Mood/Affect: Normal         Dilation     Both eyes: 1.0% Mydriacyl, 2.5% Phenylephrine @ 1:37 PM           Slit Lamp and Fundus Exam     Slit Lamp Exam       Right Left   Lids/Lashes Dermatochalasis - upper lid, Telangiectasia Dermatochalasis - upper lid, mild Meibomian gland dysfunction   Conjunctiva/Sclera Temporal Pinguecula White and quiet   Cornea Arcus, 1+ Punctate epithelial erosions, mild tear film debris, fine endo pigment Arcus, 1+ inferior Punctate epithelial erosions, mild tear film debris, fine endo pigment   Anterior Chamber Deep and quiet, narrow temporal angle Deep and quiet   Iris Round and dilated Round and dilated, pigmented mass 0730 behind iris, anterior to IOL, smaller pigmented mass 0300 -- stable   Lens PC IOL in good position with open PC PC IOL in good position with open PC   Anterior Vitreous Vitreous syneresis Vitreous syneresis         Fundus Exam       Right Left   Disc 360 Peripapillary atrophy, Sharp rim, mild Pallor Peripapillary atrophy, difficult to assess rim, mild Pallor   C/D Ratio 0.6 0.2   Macula Flat, Blunted foveal reflex, drusen, RPE mottling and clumping, temporal edema -- stably improved, punctate IRH -- resolved, no SRH, Early RPE atrophy Flat, Blunted foveal reflex, drusen, RPE mottling and clumping, no heme or edema   Vessels attenuated, Tortuous attenuated, Tortuous   Periphery Attached, scattered Reticular degeneration, pigmented cystoid degeneration temporally, No heme Attached, scattered Reticular degeneration, No heme           Refraction     Wearing Rx       Sphere Cylinder Axis Add   Right -0.75 +2.25 175 +2.75   Left -0.25 +1.25 165 +2.75            IMAGING AND PROCEDURES  Imaging and Procedures for @TODAY @  OCT, Retina - OU - Both Eyes       Right Eye Quality was good. Central Foveal Thickness: 302. Progression has been stable. Findings include no IRF, no SRF, retinal drusen , outer retinal atrophy, epiretinal membrane, intraretinal fluid, intraretinal hyper-reflective material, normal foveal contour (Stable improvement in IRF/IRHM/and foveal contour. Partial PVD).   Left Eye Quality was good. Central Foveal Thickness: 300. Progression has been stable. Findings include normal foveal contour, no IRF, no SRF, retinal drusen , outer retinal atrophy (Trace ERM).   Notes *Images captured and stored on drive  Diagnosis / Impression:  OD: Exudative ARMD - stable improvement in IRF, IRHM and foveal contour.   OS: Nonexudative ARMD OS Partial PVD OU  Clinical management:  See below  Abbreviations: NFP - Normal foveal profile. CME - cystoid macular edema. PED - pigment epithelial detachment. IRF - intraretinal fluid. SRF - subretinal fluid. EZ - ellipsoid zone. ERM - epiretinal membrane. ORA - outer retinal atrophy. ORT - outer retinal tubulation. SRHM - subretinal hyper-reflective material       Intravitreal Injection, Pharmacologic Agent - OD - Right Eye       Time Out 01/09/2021. 2:23 PM. Confirmed correct patient, procedure, site, and patient consented.   Anesthesia Topical anesthesia was used. Anesthetic medications included Lidocaine 2%, Proparacaine 0.5%.   Procedure Preparation included 5% betadine to ocular surface,  eyelid speculum. A supplied needle was used.   Injection: 1.25 mg Bevacizumab 1.25mg /0.8ml   Route: Intravitreal, Site: Right Eye   NDC: H061816, Lot: [id number] , Expiration date: 03/03/2021, Waste: 0 mL   Post-op Post injection exam found visual acuity of at least counting fingers. The patient tolerated the procedure well. There were no complications. The patient received written and  verbal post procedure care education. Post injection medications were not given.             ASSESSMENT/PLAN:    ICD-10-CM   1. Exudative age-related macular degeneration of right eye with active choroidal neovascularization (HCC)  H35.3211 Intravitreal Injection, Pharmacologic Agent - OD - Right Eye    Bevacizumab (AVASTIN) SOLN 1.25 mg    2. Retinal edema  H35.81 OCT, Retina - OU - Both Eyes    3. 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 Long-term use of Plaquenil  Z79.899     6. Pseudophakia of both eyes  Z96.1     7. Primary open angle glaucoma (POAG) of both eyes, mild stage  H40.1131     8. Dry eyes  H04.123      1,2. Exudative age related macular degeneration, OD  - conversion from nonexudative ARMD noted on 9.23.22 visit  - s/p IVA OD #1 (09.23.22), #2 (10.21.22) - BCVA 20/40+ (stable) - OCT shows OD: stable improvement in IRF/IRHM and foveal contour at 4 weeks - recommend IVA OD #3 today, 11.18.22. with extension to 6 weeks - pt wishes to be treated with IVA - RBA of procedure discussed, questions answered - informed consent obtained and signed - see procedure note - f/u in 6 wks -- DFE/OCT, possible injection   3. Age related macular degeneration, non-exudative OS  - intermediate stage-no change from previous visit  - The incidence, anatomy, and pathology of dry AMD, risk of progression, and the AREDS and AREDS 2 study including smoking risks discussed with patient.   - Recommend amsler grid monitoring   4. Iris/ciliary body mass OS  - pigmented lesion behind iris but anterior to PCIOL at 0730 and 0300  - 0730 lesion spans 0630 to 0830 behind dilated iris and larger than 0300 lesion  - patient asymptomatic  - pt saw Dr. 12.01.22, [facility] on 11.01.22 who dx her with iris margin cysts  5. Plaquenil use for RA  - baseline testing done 05.20.20 -- started  plaquenil early May 2020 for RA  - HVF 10-2 showed non-specific defects OU -- essentially normal baseline test  - OCT shows no obvious plaquenil-related changes, but interval development of exudative ARMD OD as above  - currently taking 200mg  / day per Dr. June 2020 instructions --> 3.29 mg/kg body wt per day  - po prednisone, currently as needed which is about once a week - and is also now on 10mg  po MTX weekly + folic acid  - the American Academy of Ophthalmology recommends dosing 5mg /kg per day to reduce risk of retinal toxicity   - recommend consideration of using alternate medication for RA especially given co-morbidity of age-related macular degeneration OU  - managed by [facility] Audelia H[facility] at [facility]   6. Pseudophakia OU  - s/p CE/IOL w/ ?canaloplasty OU by [facility]  - doing well  - monitor   7. History of Glaucoma-   - not currently on gtts -- IOP 08  OU  - ?underwent canaloplasty in conjunction with CEIOL at [facility] clinic    - referred to and now under the expert care of Dr. Kathlen Mody -- initial visit/consult 10.3.2019  8. Dry Eye Syndrome  -use AT's TID OU  Ophthalmic Meds Ordered this visit:  Meds ordered this encounter  Medications   Bevacizumab (AVASTIN) SOLN 1.25 mg      Return in about 6 weeks (around 02/20/2021) for f/u exu ARMD OD, DFE, OCT.  There are no Patient Instructions on file for this visit.  Explained the diagnoses, plan, and follow up with the patient and they expressed understanding.  Patient expressed understanding of the importance of proper follow up care.   This document serves as a record of services personally performed by Gardiner Sleeper, MD, PhD. It was created on their behalf by San Jetty. Owens Shark, OA an ophthalmic technician. The creation of this record is the provider's dictation and/or activities during the visit.    Electronically signed by: San Jetty. Owens Shark, [place] 11.16.2022 12:15 AM   Gardiner Sleeper, M.D.,  Ph.D. Diseases & Surgery of the Retina and Vitreous [facility]  I have reviewed the above documentation for accuracy and completeness, and I agree with the above. Gardiner Sleeper, M.D., Ph.D. 01/10/21 12:19 AM   Abbreviations: M myopia (nearsighted); A astigmatism; H hyperopia (farsighted); P presbyopia; Mrx spectacle prescription;  CTL contact lenses; OD right eye; OS left eye; OU both eyes  XT exotropia; ET esotropia; PEK punctate epithelial keratitis; PEE punctate epithelial erosions; DES dry eye syndrome; MGD meibomian gland dysfunction; ATs artificial tears; PFAT's preservative free artificial tears; [place] nuclear sclerotic cataract; PSC posterior subcapsular cataract; ERM epi-retinal membrane; PVD posterior vitreous detachment; RD retinal detachment; DM diabetes mellitus; DR diabetic retinopathy; NPDR non-proliferative diabetic retinopathy; PDR proliferative diabetic retinopathy; CSME clinically significant macular edema; DME diabetic macular edema; dbh dot blot hemorrhages; CWS cotton wool spot; POAG primary open angle glaucoma; C/D cup-to-disc ratio; HVF humphrey visual field; GVF goldmann visual field; OCT optical coherence tomography; IOP intraocular pressure; BRVO Branch retinal vein occlusion; CRVO central retinal vein occlusion; CRAO central retinal artery occlusion; BRAO branch retinal artery occlusion; RT retinal tear; SB scleral buckle; PPV pars plana vitrectomy; VH Vitreous hemorrhage; PRP panretinal laser photocoagulation; IVK intravitreal kenalog; VMT vitreomacular traction; MH Macular hole;  NVD neovascularization of the disc; NVE neovascularization elsewhere; AREDS age related eye disease study; ARMD age related macular degeneration; POAG primary open angle glaucoma; EBMD epithelial/anterior basement membrane dystrophy; ACIOL anterior chamber intraocular lens; IOL intraocular lens; PCIOL posterior chamber intraocular lens; Phaco/IOL phacoemulsification with  intraocular lens placement; [place] photorefractive keratectomy; LASIK laser assisted in situ keratomileusis; HTN hypertension; DM diabetes mellitus; COPD chronic obstructive pulmonary disease

## 2021-01-09 ENCOUNTER — Encounter (INDEPENDENT_AMBULATORY_CARE_PROVIDER_SITE_OTHER): Payer: Self-pay | Admitting: Ophthalmology

## 2021-01-09 ENCOUNTER — Other Ambulatory Visit: Payer: Self-pay

## 2021-01-09 ENCOUNTER — Ambulatory Visit (INDEPENDENT_AMBULATORY_CARE_PROVIDER_SITE_OTHER): Payer: Medicare HMO | Admitting: Ophthalmology

## 2021-01-09 DIAGNOSIS — D4989 Neoplasm of unspecified behavior of other specified sites: Secondary | ICD-10-CM

## 2021-01-09 DIAGNOSIS — H353122 Nonexudative age-related macular degeneration, left eye, intermediate dry stage: Secondary | ICD-10-CM | POA: Diagnosis not present

## 2021-01-09 DIAGNOSIS — H353211 Exudative age-related macular degeneration, right eye, with active choroidal neovascularization: Secondary | ICD-10-CM

## 2021-01-09 DIAGNOSIS — Z961 Presence of intraocular lens: Secondary | ICD-10-CM

## 2021-01-09 DIAGNOSIS — H3581 Retinal edema: Secondary | ICD-10-CM

## 2021-01-09 DIAGNOSIS — H401131 Primary open-angle glaucoma, bilateral, mild stage: Secondary | ICD-10-CM

## 2021-01-09 DIAGNOSIS — Z79899 Other long term (current) drug therapy: Secondary | ICD-10-CM | POA: Diagnosis not present

## 2021-01-09 DIAGNOSIS — H04123 Dry eye syndrome of bilateral lacrimal glands: Secondary | ICD-10-CM

## 2021-01-10 ENCOUNTER — Encounter (INDEPENDENT_AMBULATORY_CARE_PROVIDER_SITE_OTHER): Payer: Self-pay | Admitting: Ophthalmology

## 2021-01-10 DIAGNOSIS — H353211 Exudative age-related macular degeneration, right eye, with active choroidal neovascularization: Secondary | ICD-10-CM | POA: Diagnosis not present

## 2021-01-10 MED ORDER — BEVACIZUMAB CHEMO INJECTION 1.25MG/0.05ML SYRINGE FOR KALEIDOSCOPE
1.2500 mg | INTRAVITREAL | Status: AC | PRN
Start: 2021-01-10 — End: 2021-01-10
  Administered 2021-01-10: 1.25 mg via INTRAVITREAL

## 2021-02-11 NOTE — Progress Notes (Signed)
[facility] Note  02/20/2021     CHIEF COMPLAINT Patient presents for Retina Follow Up    HISTORY OF PRESENT ILLNESS: Margaret [name] is a 87 y.o. female who presents to the clinic today for:   HPI     Retina Follow Up   Patient presents with  Wet AMD.  In right eye.  Severity is moderate.  Duration of 6 weeks.  Since onset it is stable.  I, the attending physician,  performed the HPI with the patient and updated documentation appropriately.        Comments   Pt here for 6 wk ret f/u exu ARMD OD. Pt states vision is about the same, fluctuating vision between morning and evening. No major changes noted.       Last edited by Bernarda Caffey, MD on 02/20/2021 12:31 PM.       Referring physician: Ma Hillock, DO [address] [place],   [id number]  HISTORICAL INFORMATION:  Selected notes from the MEDICAL RECORD NUMBER Self referral for macular degeneration LEE: 03.21.19 (Dr. Park Liter in [place], [place]) [BCVA: OD: 20/40 OS: 20/40] Ocular Hx-POAG, non-exu ARMD, DES, pseudo, YAG PMH-astham, arthritis,    CURRENT MEDICATIONS: Current Outpatient Medications (Ophthalmic Drugs)  Medication Sig   polyvinyl alcohol (LIQUIFILM TEARS) 1.4 % ophthalmic solution Place 1 drop into both eyes as needed for dry eyes.   No current facility-administered medications for this visit. (Ophthalmic Drugs)   Current Outpatient Medications (Other)  Medication Sig   albuterol (VENTOLIN HFA) 108 (90 Base) MCG/ACT inhaler Inhale 2 puffs into the lungs every 6 (six) hours as needed for wheezing or shortness of breath.   CALCIUM PO Take 1 tablet by mouth 2 (two) times daily. Slow release   folic acid (FOLVITE) 0.5 MG tablet Take 0.5 mg by mouth daily.   lactase (LACTAID) 3000 units tablet Take 2 tablets (6,000 Units total) by mouth 3 (three) times daily as needed (lactose intolerance).   levothyroxine (SYNTHROID) 50 MCG tablet Take 1 tablet (50 mcg total) by mouth daily  before breakfast.   montelukast (SINGULAIR) 10 MG tablet Take 1 tablet (10 mg total) by mouth at bedtime.   Multiple Vitamins-Minerals (PRESERVISION AREDS 2) CAPS Take 1 capsule by mouth 2 (two) times daily.   predniSONE (DELTASONE) 5 MG tablet Take 5 mg by mouth daily as needed (joint/muscle pain).   pregabalin (LYRICA) 25 MG capsule Take 1 capsule (25 mg total) by mouth at bedtime.   No current facility-administered medications for this visit. (Other)   REVIEW OF SYSTEMS: ROS   Positive for: Musculoskeletal, Endocrine, Eyes Negative for: Constitutional, Gastrointestinal, Neurological, Skin, Genitourinary, HENT, Cardiovascular, Respiratory, Psychiatric, Allergic/Imm, Heme/Lymph Last edited by Kingsley Spittle, COT on 02/20/2021 10:32 AM.      ALLERGIES Allergies  Allergen Reactions   Augmentin [Amoxicillin-Pot Clavulanate] Swelling and Rash    Swelling in face   Lactose Intolerance (Gi)    PAST MEDICAL HISTORY Past Medical History:  Diagnosis Date   Allergy    Arthritis of left knee    Asthma    Carotid artery stenosis 10/17/2012   stable report in 2018- "moderate on right, minimal on left"   Carpal tunnel syndrome 2018   left.   Cervical radiculopathy 05/16/2015   DDD C4-C5, C5-C6, and C6-C7.  Facet arthropathy throughout cervical spine.  Normal foraminal narrowing secondary to uncovertebral  arthropathy is seen bilaterally at C3-C4 and C5/C6   Chicken pox    DDD (  degenerative disc disease), lumbar 04/26/2015   Mild levoscoliosis, right anterior listhesis of L4 and L5.  DDD at every level.  Facet arthropathy at multiple levels.   Deviated septum    Glaucoma    both eyes, mild opened angle   Heart murmur    History of colon polyps    History of fall    History of frequent urinary tract infections    klebs- pansensitive. c. freundii - resitant to cefazolin and augementin   Hypothyroidism    Intra-abdominal abscess ([place]) 03/10/2020   Iron deficiency anemia    prior  pcp told her to take Fe 325 QD   Macular degeneration    h/o retinal edema   Mitral valve prolapse 08/27/2011   Murmur, cardiac 10/17/2012   Osteoarthritis    Osteoporosis    Pneumonia    Positive TB test    In college    Pseudophakia of both eyes    PVD (peripheral vascular disease) ([place])    Rheumatic fever    87 years old    Syncope 05/17/2017   Thyroid disease    Urinary incontinence    Vertigo    had been prescribed antivert   Vitamin D deficiency    Past Surgical History:  Procedure Laterality Date   CARPAL TUNNEL RELEASE Bilateral 2019   CATARACT EXTRACTION Bilateral 1997   COLONOSCOPY  2010   NASAL SEPTUM SURGERY  2007   WRIST FRACTURE SURGERY Right 1996   FAMILY HISTORY Family History  Problem Relation Age of Onset   Arthritis Mother    COPD Mother    Prostate cancer Father    Arthritis Father    Hearing loss Father    Heart disease Father    Heart attack Father    Stroke Maternal Grandmother    Liver cancer Sister    Arthritis Brother    Prostate cancer Brother    COPD Brother    Heart disease Brother    Breast cancer Daughter    Blindness Maternal Grandfather    Stroke Maternal Grandfather    COPD Paternal Grandmother    Breast cancer Paternal Grandmother     SOCIAL HISTORY Social History   Tobacco Use   Smoking status: Former    Packs/day: 0.50    Types: Cigarettes    Start date: 1952    Quit date: 1965    Years since quitting: 58.0   Smokeless tobacco: Never  Vaping Use   Vaping Use: Never used  Substance Use Topics   Alcohol use: Not Currently    Alcohol/week: 5.0 standard drinks    Types: 5 Standard drinks or equivalent per week   Drug use: Never       OPHTHALMIC EXAM:  Base Eye Exam     Visual Acuity (Snellen - Linear)       Right Left   Dist cc 20/30 -1 20/30 -1   Dist ph cc NI NI    Correction: Glasses         Tonometry (Tonopen, 10:39 AM)       Right Left   Pressure 15 15         Pupils       Dark  Light Shape React APD   Right 3 2 Round Brisk None   Left 3 2 Round Brisk None         Visual Fields (Counting fingers)       Left Right    Full Full  Extraocular Movement       Right Left    Full, Ortho Full, Ortho         Neuro/Psych     Oriented x3: Yes   Mood/Affect: Normal         Dilation     Both eyes: 1.0% Mydriacyl, 2.5% Phenylephrine @ 10:40 AM           Slit Lamp and Fundus Exam     Slit Lamp Exam       Right Left   Lids/Lashes Dermatochalasis - upper lid, Telangiectasia Dermatochalasis - upper lid, mild Meibomian gland dysfunction   Conjunctiva/Sclera Temporal Pinguecula White and quiet   Cornea Arcus, 1+ Punctate epithelial erosions, mild tear film debris, fine endo pigment Arcus, 1+ inferior Punctate epithelial erosions, mild tear film debris, fine endo pigment   Anterior Chamber Deep and quiet, narrow temporal angle Deep and quiet   Iris Round and dilated Round and dilated, pigmented mass 0730 behind iris, anterior to IOL, smaller pigmented mass 0300 -- stable   Lens PC IOL in good position with open PC PC IOL in good position with open PC   Anterior Vitreous Vitreous syneresis Vitreous syneresis         Fundus Exam       Right Left   Disc 360 Peripapillary atrophy, Sharp rim, mild Pallor Peripapillary atrophy, difficult to assess rim, mild Pallor   C/D Ratio 0.6 0.2   Macula Flat, Blunted foveal reflex, drusen, RPE mottling and clumping, temporal edema -- stably improved, punctate IRH -- resolved, no SRH, Early RPE atrophy Flat, Blunted foveal reflex, drusen, RPE mottling and clumping, no heme or edema   Vessels attenuated, Tortuous attenuated, Tortuous   Periphery Attached, scattered Reticular degeneration, pigmented cystoid degeneration temporally, No heme Attached, scattered Reticular degeneration, No heme           Refraction     Wearing Rx       Sphere Cylinder Axis Add   Right -0.75 +2.25 175 +2.75   Left -0.25  +1.25 165 +2.75           IMAGING AND PROCEDURES  Imaging and Procedures for _0 @  OCT, Retina - OU - Both Eyes       Right Eye Quality was good. Central Foveal Thickness: 301. Progression has been stable. Findings include no IRF, no SRF, retinal drusen , outer retinal atrophy, epiretinal membrane, intraretinal fluid, intraretinal hyper-reflective material, normal foveal contour (Stable improvement in IRF/IRHM/and foveal contour. Partial PVD).   Left Eye Quality was good. Central Foveal Thickness: 295. Progression has been stable. Findings include normal foveal contour, no IRF, no SRF, retinal drusen , outer retinal atrophy (Trace ERM. Partial PVD.).   Notes *Images captured and stored on drive  Diagnosis / Impression:  OD: Exudative ARMD - stable improvement in IRF, IRHM and foveal contour.   OS: Nonexudative ARMD OS Partial PVD OU  Clinical management:  See below  Abbreviations: NFP - Normal foveal profile. CME - cystoid macular edema. PED - pigment epithelial detachment. IRF - intraretinal fluid. SRF - subretinal fluid. EZ - ellipsoid zone. ERM - epiretinal membrane. ORA - outer retinal atrophy. ORT - outer retinal tubulation. SRHM - subretinal hyper-reflective material       Intravitreal Injection, Pharmacologic Agent - OD - Right Eye       Time Out 02/20/2021. 11:17 AM. Confirmed correct patient, procedure, site, and patient consented.   Anesthesia Topical anesthesia was used. Anesthetic medications included Lidocaine 2%, Proparacaine 0.5%.  Procedure Preparation included 5% betadine to ocular surface, eyelid speculum. A supplied needle was used.   Injection: 1.25 mg Bevacizumab 1.35m/0.05ml   Route: Intravitreal, Site: Right Eye   NDC:: 10258-527-78 Lot: [id number] , Expiration date: 03/31/2021, Waste: 0 mL   Post-op Post injection exam found visual acuity of at least counting fingers. The patient tolerated the procedure well. There were no complications.  The patient received written and verbal post procedure care education. Post injection medications were not given.     \       ASSESSMENT/PLAN:    ICD-10-CM   1. Exudative age-related macular degeneration of right eye with active choroidal neovascularization (HCC)  H35.3211 OCT, Retina - OU - Both Eyes    Intravitreal Injection, Pharmacologic Agent - OD - Right Eye    Bevacizumab (AVASTIN) SOLN 1.25 mg    2. Retinal edema  H35.81     3. 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 Long-term use of Plaquenil  Z79.899     6. Pseudophakia of both eyes  Z96.1     7. Primary open angle glaucoma (POAG) of both eyes, mild stage  H40.1131     8. Dry eyes  H04.123       1,2. Exudative age related macular degeneration, OD  - conversion from nonexudative ARMD noted on 9.23.22 visit  - s/p IVA OD #1 (09.23.22), #2 (10.21.22), #3 (11.18.22) - BCVA improved to 20/30 from 20/40 - OCT shows OD: stable improvement in IRF/IRHM and foveal contour at 6 weeks - recommend IVA OD #4 today, 12.30.22. continue with 6 week f/u - pt wishes to be treated with IVA - RBA of procedure discussed, questions answered - informed consent obtained and signed - see procedure note - f/u in 6 wks -- DFE/OCT, possible injection   3. Age related macular degeneration, non-exudative OS  - intermediate stage-no change from previous visit  - The incidence, anatomy, and pathology of dry AMD, risk of progression, and the AREDS and AREDS 2 study including smoking risks discussed with patient.   - Recommend amsler grid monitoring   4. Iris/ciliary body mass OS  - pigmented lesion behind iris but anterior to PCIOL at 0730 and 0300  - 0730 lesion spans 0630 to 0830 behind dilated iris and larger than 0300 lesion  - patient asymptomatic  - pt [facility] who dx her with  iris margin cyst  5. Plaquenil use for RA  - baseline testing done 05.20.20 -- started plaquenil early May 2020 for RA  - HVF 10-2 showed non-specific defects OU -- essentially normal baseline test  - OCT shows no obvious plaquenil-related changes, but interval development of exudative ARMD OD as above  - currently taking 2052m/ day per Dr. SyAudelia Hivesnstructions --> 3.29 mg/kg body wt per day  - po prednisone, currently as needed which is about once a week - and is also now on 106mo MTX weekly + folic acid  - the American Academy of Ophthalmology recommends dosing <5mg54m per day to reduce risk of retinal toxicity   - recommend consideration of using alternate medication for RA especially given co-morbidity of age-related macular degeneration OU  - [facility]. Pseudophakia OU  - s/p CE/IOL w/ ?[facility]  doing well  - monitor   7. History of Glaucoma   - not currently on gtts -- IOP 15 OU  - ?underwent canaloplasty in conjunction with CEIOL at [facility] clinic    - referred to and now under the expert care of Dr. Kathlen Mody -- initial visit/consult 10.3.2019  8. Dry Eye Syndrome  -use AT's TID OU  Ophthalmic Meds Ordered this visit:  Meds ordered this encounter  Medications   Bevacizumab (AVASTIN) SOLN 1.25 mg      Return in about 6 weeks (around 04/03/2021) for DFE, OCT, possible injection.  There are no Patient Instructions on file for this visit.  Explained the diagnoses, plan, and follow up with the patient and they expressed understanding.  Patient expressed understanding of the importance of proper follow up care.   This document serves as a record of services personally performed by Gardiner Sleeper, MD, PhD. It was created on their behalf by Leonie Douglas, an ophthalmic technician. The creation of this record is the provider's dictation and/or activities during the visit.     Electronically signed by: Leonie Douglas COA, 02/20/21  12:45 PM  Gardiner Sleeper, M.D., Ph.D. Diseases & Surgery of the Retina and [place] 02/20/2021  I have reviewed the above documentation for accuracy and completeness, and I agree with the above. Gardiner Sleeper, M.D., Ph.D. 02/20/21 12:45 PM  Abbreviations: M myopia (nearsighted); A astigmatism; H hyperopia (farsighted); P presbyopia; Mrx spectacle prescription;  CTL contact lenses; OD right eye; OS left eye; OU both eyes  XT exotropia; ET esotropia; PEK punctate epithelial keratitis; PEE punctate epithelial erosions; DES dry eye syndrome; MGD meibomian gland dysfunction; ATs artificial tears; PFAT's preservative free artificial tears; [place] nuclear sclerotic cataract; PSC posterior subcapsular cataract; ERM epi-retinal membrane; PVD posterior vitreous detachment; RD retinal detachment; DM diabetes mellitus; DR diabetic retinopathy; NPDR non-proliferative diabetic retinopathy; PDR proliferative diabetic retinopathy; CSME clinically significant macular edema; DME diabetic macular edema; dbh dot blot hemorrhages; CWS cotton wool spot; POAG primary open angle glaucoma; C/D cup-to-disc ratio; HVF humphrey visual field; GVF goldmann visual field; OCT optical coherence tomography; IOP intraocular pressure; BRVO Branch retinal vein occlusion; CRVO central retinal vein occlusion; CRAO central retinal artery occlusion; BRAO branch retinal artery occlusion; RT retinal tear; SB scleral buckle; PPV pars plana vitrectomy; VH Vitreous hemorrhage; PRP panretinal laser photocoagulation; IVK intravitreal kenalog; VMT vitreomacular traction; MH Macular hole;  NVD neovascularization of the disc; NVE neovascularization elsewhere; AREDS age related eye disease study; ARMD age related macular degeneration; POAG primary open angle glaucoma; EBMD epithelial/anterior basement membrane dystrophy; ACIOL anterior chamber intraocular lens; IOL  intraocular lens; PCIOL posterior chamber intraocular lens; Phaco/IOL phacoemulsification with intraocular lens placement; [place] photorefractive keratectomy; LASIK laser assisted in situ keratomileusis; HTN hypertension; DM diabetes mellitus; COPD chronic obstructive pulmonary disease

## 2021-02-13 ENCOUNTER — Encounter (INDEPENDENT_AMBULATORY_CARE_PROVIDER_SITE_OTHER): Payer: Medicare HMO | Admitting: Ophthalmology

## 2021-02-20 ENCOUNTER — Other Ambulatory Visit: Payer: Self-pay

## 2021-02-20 ENCOUNTER — Encounter (INDEPENDENT_AMBULATORY_CARE_PROVIDER_SITE_OTHER): Payer: Self-pay | Admitting: Ophthalmology

## 2021-02-20 ENCOUNTER — Encounter (INDEPENDENT_AMBULATORY_CARE_PROVIDER_SITE_OTHER): Payer: Medicare HMO | Admitting: Ophthalmology

## 2021-02-20 ENCOUNTER — Ambulatory Visit (INDEPENDENT_AMBULATORY_CARE_PROVIDER_SITE_OTHER): Payer: Medicare HMO | Admitting: Ophthalmology

## 2021-02-20 DIAGNOSIS — D4989 Neoplasm of unspecified behavior of other specified sites: Secondary | ICD-10-CM | POA: Diagnosis not present

## 2021-02-20 DIAGNOSIS — H401131 Primary open-angle glaucoma, bilateral, mild stage: Secondary | ICD-10-CM

## 2021-02-20 DIAGNOSIS — H04123 Dry eye syndrome of bilateral lacrimal glands: Secondary | ICD-10-CM

## 2021-02-20 DIAGNOSIS — H353211 Exudative age-related macular degeneration, right eye, with active choroidal neovascularization: Secondary | ICD-10-CM | POA: Diagnosis not present

## 2021-02-20 DIAGNOSIS — H353122 Nonexudative age-related macular degeneration, left eye, intermediate dry stage: Secondary | ICD-10-CM | POA: Diagnosis not present

## 2021-02-20 DIAGNOSIS — Z961 Presence of intraocular lens: Secondary | ICD-10-CM

## 2021-02-20 DIAGNOSIS — H3581 Retinal edema: Secondary | ICD-10-CM

## 2021-02-20 DIAGNOSIS — Z79899 Other long term (current) drug therapy: Secondary | ICD-10-CM

## 2021-02-20 MED ORDER — BEVACIZUMAB CHEMO INJECTION 1.25MG/0.05ML SYRINGE FOR KALEIDOSCOPE
1.2500 mg | INTRAVITREAL | Status: AC | PRN
  Administered 2021-02-20: 13:00:00 1.25 mg via INTRAVITREAL

## 2021-03-06 ENCOUNTER — Other Ambulatory Visit: Payer: Self-pay | Admitting: Family Medicine

## 2021-03-12 ENCOUNTER — Telehealth: Payer: Self-pay

## 2021-03-12 NOTE — Telephone Encounter (Signed)
Patient refill request. [facility] - [place]  pregabalin (LYRICA) 25 MG capsule [[id number]]   levothyroxine (SYNTHROID) 50 MCG tablet [[id number]]

## 2021-03-12 NOTE — Telephone Encounter (Signed)
Levothyroxine refilled 08/2020(90,3) equalling 1 year supply and Pregablin 12/09/20(90,1) 6 month supply. Pt should still have refills available at [facility]. LM for pt to return call

## 2021-03-13 NOTE — Telephone Encounter (Signed)
Spoke with patient regarding results/recommendations.  

## 2021-04-02 NOTE — Progress Notes (Signed)
[facility] Note  04/06/2021     CHIEF COMPLAINT Patient presents for Retina Follow Up    HISTORY OF PRESENT ILLNESS: Margaret [name] is a 88 y.o. female who presents to the clinic today for:   HPI     Retina Follow Up   Patient presents with  Wet AMD.  In right eye.  Severity is moderate.  Duration of 6 weeks.  Since onset it is stable.  I, the attending physician,  performed the HPI with the patient and updated documentation appropriately.        Comments   Pt here for 6 wk ret f/u for exu ARMD OD. Pt states no VA change. She did mention needing to get her rx specs updated, previous patient of Dr. Kathlen Mody. Shes wanting to know if its a good time to make an appointment.       Last edited by Bernarda Caffey, MD on 04/06/2021 12:36 PM.    Pt states no change in vision  Referring physician: Howard Pouch A, DO [address] [place],  [place] [address]  HISTORICAL INFORMATION:  Selected notes from the MEDICAL RECORD NUMBER Self referral for macular degeneration LEE: 03.21.19 (Dr. Park Liter in [place], [place]) [BCVA: OD: 20/40 OS: 20/40] Ocular Hx-POAG, non-exu ARMD, DES, pseudo, YAG PMH-astham, arthritis,    CURRENT MEDICATIONS: Current Outpatient Medications (Ophthalmic Drugs)  Medication Sig   polyvinyl alcohol (LIQUIFILM TEARS) 1.4 % ophthalmic solution Place 1 drop into both eyes as needed for dry eyes.   No current facility-administered medications for this visit. (Ophthalmic Drugs)   Current Outpatient Medications (Other)  Medication Sig   albuterol (VENTOLIN HFA) 108 (90 Base) MCG/ACT inhaler Inhale 2 puffs into the lungs every 6 (six) hours as needed for wheezing or shortness of breath.   CALCIUM PO Take 1 tablet by mouth 2 (two) times daily. Slow release   folic acid (FOLVITE) 0.5 MG tablet Take 0.5 mg by mouth daily.   lactase (LACTAID) 3000 units tablet Take 2 tablets (6,000 Units total) by mouth 3 (three) times daily as needed (lactose  intolerance).   levothyroxine (SYNTHROID) 50 MCG tablet Take 1 tablet (50 mcg total) by mouth daily before breakfast.   montelukast (SINGULAIR) 10 MG tablet Take 1 tablet (10 mg total) by mouth at bedtime.   Multiple Vitamins-Minerals (PRESERVISION AREDS 2) CAPS Take 1 capsule by mouth 2 (two) times daily.   predniSONE (DELTASONE) 5 MG tablet Take 5 mg by mouth daily as needed (joint/muscle pain).   pregabalin (LYRICA) 25 MG capsule Take 1 capsule (25 mg total) by mouth at bedtime.   No current facility-administered medications for this visit. (Other)   REVIEW OF SYSTEMS: ROS   Positive for: Musculoskeletal, Endocrine, Eyes Negative for: Constitutional, Gastrointestinal, Neurological, Skin, Genitourinary, HENT, Cardiovascular, Respiratory, Psychiatric, Allergic/Imm, Heme/Lymph Last edited by Kingsley Spittle, COT on 04/06/2021  9:38 AM.     ALLERGIES Allergies  Allergen Reactions   Augmentin [Amoxicillin-Pot Clavulanate] Swelling and Rash    Swelling in face   Lactose Intolerance (Gi)    PAST MEDICAL HISTORY Past Medical History:  Diagnosis Date   Allergy    Arthritis of left knee    Asthma    Carotid artery stenosis 10/17/2012   stable report in 2018- "moderate on right, minimal on left"   Carpal tunnel syndrome 2018   left.   Cervical radiculopathy 05/16/2015   DDD C4-C5, C5-C6, and C6-C7.  Facet arthropathy throughout cervical spine.  Normal  foraminal narrowing secondary to uncovertebral  arthropathy is seen bilaterally at C3-C4 and C5/C6   Chicken pox    DDD (degenerative disc disease), lumbar 04/26/2015   Mild levoscoliosis, right anterior listhesis of L4 and L5.  DDD at every level.  Facet arthropathy at multiple levels.   Deviated septum    Glaucoma    both eyes, mild opened angle   Heart murmur    History of colon polyps    History of fall    History of frequent urinary tract infections    klebs- pansensitive. c. 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    Pseudophakia of both eyes    PVD (peripheral vascular disease) ([place])    Rheumatic fever    88 years old    Syncope 05/17/2017   Thyroid disease    Urinary incontinence    Vertigo    had been prescribed antivert   Vitamin D deficiency    Past Surgical History:  Procedure Laterality Date   CARPAL TUNNEL RELEASE Bilateral 2019   CATARACT EXTRACTION Bilateral 1997   COLONOSCOPY  2010   NASAL SEPTUM SURGERY  2007   WRIST FRACTURE SURGERY Right 1996   FAMILY HISTORY Family History  Problem Relation Age of Onset   Arthritis Mother    COPD Mother    Prostate cancer Father    Arthritis Father    Hearing loss Father    Heart disease Father    Heart attack Father    Stroke Maternal Grandmother    Liver cancer Sister    Arthritis Brother    Prostate cancer Brother    COPD Brother    Heart disease Brother    Breast cancer Daughter    Blindness Maternal Grandfather    Stroke Maternal Grandfather    COPD Paternal Grandmother    Breast cancer Paternal Grandmother     SOCIAL HISTORY Social History   Tobacco Use   Smoking status: Former    Packs/day: 0.50    Types: Cigarettes    Start date: 1952    Quit date: 1965    Years since quitting: 58.1   Smokeless tobacco: Never  Vaping Use   Vaping Use: Never used  Substance Use Topics   Alcohol use: Not Currently    Alcohol/week: 5.0 standard drinks    Types: 5 Standard drinks or equivalent per week   Drug use: Never       OPHTHALMIC EXAM:  Base Eye Exam     Visual Acuity (Snellen - Linear)       Right Left   Dist cc 20/50 +1 20/30 +2   Dist ph cc 20/30 -2 NI    Correction: Glasses          Tonometry (Tonopen, 9:44 AM)       Right Left   Pressure 16 15         Pupils       Dark Light Shape React APD   Right 3 2 Round Brisk None   Left 3 2 Round Brisk None  Visual Fields (Counting fingers)       Left Right    Full Full         Extraocular Movement       Right Left    Full, Ortho Full, Ortho         Neuro/Psych     Oriented x3: Yes   Mood/Affect: Normal         Dilation     Both eyes: 1.0% Mydriacyl, 2.5% Phenylephrine @ 9:45 AM           Slit Lamp and Fundus Exam     Slit Lamp Exam       Right Left   Lids/Lashes Dermatochalasis - upper lid, Telangiectasia Dermatochalasis - upper lid, mild Meibomian gland dysfunction   Conjunctiva/Sclera Temporal Pinguecula White and quiet   Cornea Arcus, 1+ Punctate epithelial erosions, mild tear film debris, fine endo pigment Arcus, 1+ inferior Punctate epithelial erosions, mild tear film debris, fine endo pigment   Anterior Chamber Deep and quiet, narrow temporal angle Deep and quiet   Iris Round and dilated Round and dilated, pigmented mass 0730 behind iris, anterior to IOL, smaller pigmented mass 0300 -- not visible today   Lens PC IOL in good position with open PC PC IOL in good position with open PC   Anterior Vitreous Vitreous syneresis Vitreous syneresis, Posterior vitreous detachment         Fundus Exam       Right Left   Disc 360 Peripapillary atrophy, Sharp rim, mild Pallor Peripapillary atrophy, difficult to assess rim, mild Pallor   C/D Ratio 0.6 0.3   Macula Flat, Blunted foveal reflex, drusen, RPE mottling and clumping, temporal edema -- stably improved, punctate IRH -- resolved, no SRH, Early RPE atrophy Flat, Blunted foveal reflex, drusen, RPE mottling and clumping, no heme or edema   Vessels attenuated, Tortuous attenuated, Tortuous   Periphery Attached, scattered Reticular degeneration, pigmented cystoid degeneration temporally, No heme Attached, scattered Reticular  degeneration, No heme           Refraction     Wearing Rx       Sphere Cylinder Axis Add   Right -0.75 +2.25 175 +2.75   Left -0.25 +1.25 165 +2.75           IMAGING AND PROCEDURES  Imaging and Procedures for _0 @  OCT, Retina - OU - Both Eyes       Right Eye Quality was good. Central Foveal Thickness: 305. Progression has been stable. Findings include no SRF, retinal drusen , outer retinal atrophy, epiretinal membrane, intraretinal fluid, intraretinal hyper-reflective material, normal foveal contour (Stable improvement in IRHM/and foveal contour, trace persistent cystic changes, Partial PVD).   Left Eye Quality was good. Central Foveal Thickness: 297. Progression has been stable. Findings include normal foveal contour, no IRF, no SRF, retinal drusen , outer retinal atrophy (Trace ERM. Partial PVD.).   Notes *Images captured and stored on drive  Diagnosis / Impression:  OD: Exudative ARMD - Stable improvement in IRHM/and foveal contour, trace persistent cystic changes OS: Nonexudative ARMD OS Partial PVD OU  Clinical management:  See below  Abbreviations: NFP - Normal foveal profile. CME - cystoid macular edema. PED - pigment epithelial detachment. IRF - intraretinal fluid. SRF - subretinal fluid. EZ - ellipsoid zone. ERM - epiretinal membrane. ORA - outer retinal atrophy. ORT - outer retinal tubulation. SRHM - subretinal hyper-reflective material       Intravitreal Injection, Pharmacologic Agent - OD - Right Eye  Time Out 04/06/2021. 10:18 AM. Confirmed correct patient, procedure, site, and patient consented.   Anesthesia Topical anesthesia was used. Anesthetic medications included Lidocaine 2%, Proparacaine 0.5%.   Procedure Preparation included 5% betadine to ocular surface, eyelid speculum. A supplied needle was used.   Injection: 1.25 mg Bevacizumab 1.39m/0.05ml   Route: Intravitreal, Site: Right Eye   NDC:: 90240-973-53 Lot: [id number] ,  Expiration date: 05/06/2021   Post-op Post injection exam found visual acuity of at least counting fingers. The patient tolerated the procedure well. There were no complications. The patient received written and verbal post procedure care education. Post injection medications were not given.            ASSESSMENT/PLAN:    ICD-10-CM   1. Exudative age-related macular degeneration of right eye with active choroidal neovascularization (HCC)  H35.3211 OCT, Retina - OU - Both Eyes    Intravitreal Injection, Pharmacologic Agent - OD - Right Eye    Bevacizumab (AVASTIN) SOLN 1.25 mg    2. 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 Long-term use of Plaquenil  Z79.899     5. Pseudophakia of both eyes  Z96.1     6. Primary open angle glaucoma (POAG) of both eyes, mild stage  H40.1131     7. Dry eyes  H04.123       1. Exudative age related macular degeneration, OD  - conversion from nonexudative ARMD noted on 9.23.22 visit  - s/p IVA OD #1 (09.23.22), #2 (10.21.22), #3 (11.18.22), #4 (12.30.22) - BCVA stably improved to 20/30 from 20/40 - OCT shows OD: stable improvement in IRHM/and foveal contour, trace persistent cystic changes, Partial PVD at 6 weeks - recommend IVA OD #5 today, 02.13.23 continue with 6 week f/u - pt wishes to be treated with IVA - RBA of procedure discussed, questions answered - informed consent obtained and signed - see procedure note - f/u in 6 wks -- DFE/OCT, possible injection   2. Age related macular degeneration, non-exudative OS  - intermediate stage-no change from previous visit  - The incidence, anatomy, and pathology of dry AMD, risk of progression, and the AREDS and AREDS 2 study including smoking risks discussed with patient.   - recommend [place] grid monitoring  3. Iris/ciliary body mass OS  - pigmented lesion behind iris but anterior to PCIOL at 0730 and 0300  - 0730  lesion spans 0630 to 0830 behind dilated iris and larger than 0300 lesion  - patient asymptomatic  - 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 Plaquenil use for RA  - baseline testing done 05.20.20 -- started plaquenil early May 2020 for RA  - HVF 10-2 showed non-specific defects OU -- essentially normal baseline test  - OCT shows no obvious plaquenil-related changes, but interval development of exudative ARMD OD as above  - currently taking 2017m/ day per Dr. SyAudelia Hivesnstructions --> 3.29 mg/kg body wt per day  - po prednisone, currently as needed which is about once a week - and is also now on 1025mo MTX weekly + folic acid  - the American Academy of Ophthalmology recommends dosing <5mg91m per day to reduce risk of retinal toxicity   - recommend consideration of using alternate medication for RA especially given co-morbidity of age-related macular degeneration OU  - [facility]  5. Pseudophakia OU  - s/p CE/IOL w/ ?canaloplasty OU by Dr[facility]yne [facility]w at [facility]  - doing well  - monitor  6. History of Glaucoma   - not currently on gtts -- IOP 15 OU  - ?underwent canaloplasty in conjunction with CEIOL at [facility] clinic    - referred to and now under the expert care of Dr. Kathlen Mody -- initial visit/consult 10.3.2019  7. Dry Eye Syndrome  - use AT's TID OU  Ophthalmic Meds Ordered this visit:  Meds ordered this encounter  Medications   Bevacizumab (AVASTIN) SOLN 1.25 mg      Return in about 6 weeks (around 05/18/2021) for f/u exu ARMD OD, DFE, OCT.  There are no Patient Instructions on file for this visit.  Explained the diagnoses, plan, and follow up with the patient and they expressed understanding.  Patient expressed understanding of the importance of proper follow up care.   This document serves as a record of services personally performed by Gardiner Sleeper, MD, PhD. It was  created on their behalf by Roselee Nova, COMT. The creation of this record is the provider's dictation and/or activities during the visit.  Electronically signed by: Roselee Nova, COMT 04/06/21 12:38 PM  This document serves as a record of services personally performed by Gardiner Sleeper, MD, PhD. It was created on their behalf by San Jetty. Owens Shark, OA an ophthalmic technician. The creation of this record is the provider's dictation and/or activities during the visit.    Electronically signed by: San Jetty. Marguerita Merles 02.13.2023 12:38 PM  Gardiner Sleeper, M.D., Ph.D. Diseases & Surgery of the Retina and Vitreous [facility]  I have reviewed the above documentation for accuracy and completeness, and I agree with the above. Gardiner Sleeper, M.D., Ph.D. 04/06/21 12:41 PM   Abbreviations: M myopia (nearsighted); A astigmatism; H hyperopia (farsighted); P presbyopia; Mrx spectacle prescription;  CTL contact lenses; OD right eye; OS left eye; OU both eyes  XT exotropia; ET esotropia; PEK punctate epithelial keratitis; PEE punctate epithelial erosions; DES dry eye syndrome; MGD meibomian gland dysfunction; ATs artificial tears; PFAT's preservative free artificial tears; [place] nuclear sclerotic cataract; PSC posterior subcapsular cataract; ERM epi-retinal membrane; PVD posterior vitreous detachment; RD retinal detachment; DM diabetes mellitus; DR diabetic retinopathy; NPDR non-proliferative diabetic retinopathy; PDR proliferative diabetic retinopathy; CSME clinically significant macular edema; DME diabetic macular edema; dbh dot blot hemorrhages; CWS cotton wool spot; POAG primary open angle glaucoma; C/D cup-to-disc ratio; HVF humphrey visual field; GVF goldmann visual field; OCT optical coherence tomography; IOP intraocular pressure; BRVO Branch retinal vein occlusion; CRVO central retinal vein occlusion; CRAO central retinal artery occlusion; BRAO branch retinal artery occlusion; RT  retinal tear; SB scleral buckle; PPV pars plana vitrectomy; VH Vitreous hemorrhage; PRP panretinal laser photocoagulation; IVK intravitreal kenalog; VMT vitreomacular traction; MH Macular hole;  NVD neovascularization of the disc; NVE neovascularization elsewhere; AREDS age related eye disease study; ARMD age related macular degeneration; POAG primary open angle glaucoma; EBMD epithelial/anterior basement membrane dystrophy; ACIOL anterior chamber intraocular lens; IOL intraocular lens; PCIOL posterior chamber intraocular lens; Phaco/IOL phacoemulsification with intraocular lens placement; [place] photorefractive keratectomy; LASIK laser assisted in situ keratomileusis; HTN hypertension; DM diabetes mellitus; COPD chronic obstructive pulmonary disease

## 2021-04-03 ENCOUNTER — Encounter (INDEPENDENT_AMBULATORY_CARE_PROVIDER_SITE_OTHER): Payer: Medicare HMO | Admitting: Ophthalmology

## 2021-04-06 ENCOUNTER — Ambulatory Visit (INDEPENDENT_AMBULATORY_CARE_PROVIDER_SITE_OTHER): Payer: Medicare HMO | Admitting: Ophthalmology

## 2021-04-06 ENCOUNTER — Other Ambulatory Visit: Payer: Self-pay

## 2021-04-06 ENCOUNTER — Encounter (INDEPENDENT_AMBULATORY_CARE_PROVIDER_SITE_OTHER): Payer: Self-pay | Admitting: Ophthalmology

## 2021-04-06 DIAGNOSIS — Z961 Presence of intraocular lens: Secondary | ICD-10-CM

## 2021-04-06 DIAGNOSIS — Z79899 Other long term (current) drug therapy: Secondary | ICD-10-CM

## 2021-04-06 DIAGNOSIS — H353211 Exudative age-related macular degeneration, right eye, with active choroidal neovascularization: Secondary | ICD-10-CM | POA: Diagnosis not present

## 2021-04-06 DIAGNOSIS — H353122 Nonexudative age-related macular degeneration, left eye, intermediate dry stage: Secondary | ICD-10-CM

## 2021-04-06 DIAGNOSIS — D4989 Neoplasm of unspecified behavior of other specified sites: Secondary | ICD-10-CM | POA: Diagnosis not present

## 2021-04-06 DIAGNOSIS — H04123 Dry eye syndrome of bilateral lacrimal glands: Secondary | ICD-10-CM

## 2021-04-06 DIAGNOSIS — H401131 Primary open-angle glaucoma, bilateral, mild stage: Secondary | ICD-10-CM

## 2021-04-06 MED ORDER — BEVACIZUMAB CHEMO INJECTION 1.25MG/0.05ML SYRINGE FOR KALEIDOSCOPE
1.2500 mg | INTRAVITREAL | Status: AC | PRN
  Administered 2021-04-06: 1.25 mg via INTRAVITREAL

## 2021-05-12 NOTE — Progress Notes (Signed)
?[facility] Note ? ?05/15/2021 ? ?  ? ?CHIEF COMPLAINT ?Patient presents for Retina Follow Up ? ? ?HISTORY OF PRESENT ILLNESS: ?Margaret [name] is a 88 y.o. female who presents to the clinic today for:  ? ?HPI   ? ? Retina Follow Up   ?Patient presents with  Wet AMD.  In right eye.  Duration of 5 weeks.  Since onset it is stable.  I, the attending physician,  performed the HPI with the patient and updated documentation appropriately. ? ?  ?  ? ? Comments   ?5 1/2 week follow up Exu ARMD OD- Really bad irritation OU, at times worse than others.  She does take allergy meds that will give it some relief.  It has been so bad that she will go to sleep.  She is still on Prednisone q3 days. ?She has been off Plaquenil since Sept 2022.  Once OD became "wet" she stopped taking it. ? ? ?  ?  ?Last edited by Bernarda Caffey, MD on 05/15/2021 11:00 PM.  ?  ? ?Pt states Dr. Lucianne Lei has put her on Restasis for dry eyes ? ?Referring physician: ?[name], [name], DO ?[address] [place],  [place] [address] ? ?HISTORICAL INFORMATION:  ?Selected notes from the [facility] ?Self referral for macular degeneration ?LEE: 03.21.19 (Dr. Park Liter in [place], [place]) [place]: OD: 20/40 OS: 20/40] ?Ocular Hx-POAG, non-exu ARMD, DES, pseudo, YAG ?PMH-astham, arthritis,   ? ?CURRENT MEDICATIONS: ?Current Outpatient Medications (Ophthalmic Drugs)  ?Medication Sig  ? polyvinyl alcohol (LIQUIFILM TEARS) 1.4 % ophthalmic solution Place 1 drop into both eyes as needed for dry eyes.  ? ?No current facility-administered medications for this visit. (Ophthalmic Drugs)  ? ?Current Outpatient Medications (Other)  ?Medication Sig  ? albuterol (VENTOLIN HFA) 108 (90 Base) MCG/ACT inhaler Inhale 2 puffs into the lungs every 6 (six) hours as needed for wheezing or shortness of breath.  ? CALCIUM PO Take 1 tablet by mouth 2 (two) times daily. Slow release  ? folic acid (FOLVITE) 0.5 MG tablet Take 0.5 mg by mouth daily.  ? lactase (LACTAID)  3000 units tablet Take 2 tablets (6,000 Units total) by mouth 3 (three) times daily as needed (lactose intolerance).  ? levothyroxine (SYNTHROID) 50 MCG tablet Take 1 tablet (50 mcg total) by mouth daily before breakfast.  ? montelukast (SINGULAIR) 10 MG tablet Take 1 tablet (10 mg total) by mouth at bedtime.  ? Multiple Vitamins-Minerals (PRESERVISION AREDS 2) CAPS Take 1 capsule by mouth 2 (two) times daily.  ? predniSONE (DELTASONE) 5 MG tablet Take 5 mg by mouth daily as needed (joint/muscle pain).  ? pregabalin (LYRICA) 25 MG capsule Take 1 capsule (25 mg total) by mouth at bedtime.  ? ?No current facility-administered medications for this visit. (Other)  ? ?REVIEW OF SYSTEMS: ?ROS   ?Positive for: Musculoskeletal, Endocrine, Eyes ?Negative for: Constitutional, Gastrointestinal, Neurological, Skin, Genitourinary, HENT, Cardiovascular, Respiratory, Psychiatric, Allergic/Imm, Heme/Lymph ?Last edited by Leonie Douglas, [place] on 05/15/2021  1:15 PM.  ?  ? ? ?ALLERGIES ?Allergies  ?Allergen Reactions  ? Augmentin [Amoxicillin-Pot Clavulanate] Swelling and Rash  ?  Swelling in face  ? Lactose Intolerance (Gi)   ? ?PAST MEDICAL HISTORY ?Past Medical History:  ?Diagnosis Date  ? Allergy   ? Arthritis of left knee   ? Asthma   ? Carotid artery stenosis 10/17/2012  ? stable report in 2018- "moderate on right, minimal on left"  ? Carpal tunnel syndrome 2018  ?  left.  ? Cervical radiculopathy 05/16/2015  ? DDD C4-C5, C5-C6, and C6-C7.  Facet arthropathy throughout cervical spine.  Normal foraminal narrowing secondary to uncovertebral  arthropathy is seen bilaterally at C3-C4 and C5/C6  ? Chicken pox   ? DDD (degenerative disc disease), lumbar 04/26/2015  ? Mild levoscoliosis, right anterior listhesis of L4 and L5.  DDD at every level.  Facet arthropathy at multiple levels.  ? Deviated septum   ? Glaucoma   ? both eyes, mild opened angle  ? Heart murmur   ? History of colon polyps   ? History of fall   ? History of frequent  urinary tract infections   ? klebs- pansensitive. c. freundii - resitant to cefazolin and augementin  ? Hypothyroidism   ? Intra-abdominal abscess ([place]) 03/10/2020  ? Iron deficiency anemia   ? prior pcp told her to take Fe 325 QD  ? Macular degeneration   ? h/o retinal edema  ? Mitral valve prolapse 08/27/2011  ? Murmur, cardiac 10/17/2012  ? Osteoarthritis   ? Osteoporosis   ? Pneumonia   ? 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 Pseudophakia of both eyes   ? PVD (peripheral vascular disease) ([place])   ? Rheumatic fever   ? 88 years old   ? Syncope 05/17/2017  ? Thyroid disease   ? Urinary incontinence   ? Vertigo   ? had been prescribed antivert  ? Vitamin D deficiency   ? ?Past Surgical History:  ?Procedure Laterality Date  ? CARPAL TUNNEL RELEASE Bilateral 2019  ? CATARACT EXTRACTION Bilateral 1997  ? COLONOSCOPY  2010  ? NASAL SEPTUM SURGERY  2007  ? WRIST FRACTURE SURGERY Right 1996  ? ?FAMILY HISTORY ?Family History  ?Problem Relation Age of Onset  ? Arthritis Mother   ? COPD Mother   ? Prostate cancer Father   ? Arthritis Father   ? Hearing loss Father   ? Heart disease Father   ? Heart attack Father   ? Stroke Maternal Grandmother   ? Liver cancer Sister   ? Arthritis Brother   ? Prostate cancer Brother   ? COPD Brother   ? Heart disease Brother   ? Breast cancer Daughter   ? Blindness Maternal Grandfather   ? Stroke Maternal Grandfather   ? COPD Paternal Grandmother   ? Breast cancer Paternal Grandmother   ? ? ?SOCIAL HISTORY ?Social History  ? ?Tobacco Use  ? Smoking status: Former  ?  Packs/day: 0.50  ?  Types: Cigarettes  ?  Start date: 79  ?  Quit date: 70  ?  Years since quitting: 58.2  ? Smokeless tobacco: Never  ?Vaping Use  ? Vaping Use: Never used  ?Substance Use Topics  ? Alcohol use: Not Currently  ?  Alcohol/week: 5.0 standard drinks  ?  Types: 5 Standard drinks or equivalent per week  ? Drug use: Never  ?  ? ?  ?OPHTHALMIC EXAM: ? ?Base Eye Exam   ? ? Visual Acuity (Snellen - Linear)   ? ?    Right Left  ? Dist cc 20/40 +1 20/30  ? Dist ph cc NI NI  ? ? Correction: Glasses  ? ?  ?  ? ? Tonometry (Tonopen, 1:22 PM)   ? ?   Right Left  ? Pressure 15 14  ? ?  ?  ? ? Pupils   ? ?   Dark Light Shape React APD  ? Right 3  2 Round Brisk None  ? Left 3 2 Round Brisk None  ? ?  ?  ? ? Visual Fields (Counting fingers)   ? ?   Left Right  ?  Full Full  ? ?  ?  ? ? Extraocular Movement   ? ?   Right Left  ?  Full Full  ? ?  ?  ? ? Neuro/Psych   ? ? Oriented x3: Yes  ? Mood/Affect: Normal  ? ?  ?  ? ? Dilation   ? ? Both eyes: 1.0% Mydriacyl, 2.5% Phenylephrine @ 1:22 PM  ? ?  ?  ? ?  ? ?Slit Lamp and Fundus Exam   ? ? Slit Lamp Exam   ? ?   Right Left  ? Lids/Lashes Dermatochalasis - upper lid, Telangiectasia Dermatochalasis - upper lid, mild Meibomian gland dysfunction  ? Conjunctiva/Sclera Temporal Pinguecula White and quiet  ? Cornea Arcus, 1+ Punctate epithelial erosions, mild tear film debris, fine endo pigment Arcus, 1-2+ inferior Punctate epithelial erosions, mild tear film debris, fine endo pigment  ? Anterior Chamber Deep and quiet, narrow temporal angle Deep and quiet  ? Iris Round and dilated Round and dilated, pigmented mass 0730 behind iris, anterior to IOL, smaller pigmented mass 0300 -- not visible today  ? Lens PC IOL in good position with open PC PC IOL in good position with open PC  ? Anterior Vitreous Vitreous syneresis Vitreous syneresis, Posterior vitreous detachment  ? ?  ?  ? ? Fundus Exam   ? ?   Right Left  ? Disc 360 Peripapillary atrophy, Sharp rim, mild Pallor Peripapillary atrophy, sharp rim, temporal PPA, mild Pallor  ? C/D Ratio 0.6 0.3  ? Macula Flat, Blunted foveal reflex, drusen, RPE mottling and clumping, temporal edema -- stably improved, punctate IRH -- resolved, no SRH, Early RPE atrophy Flat, Blunted foveal reflex, drusen, RPE mottling and clumping, no heme or edema  ? Vessels attenuated, Tortuous attenuated, Tortuous  ? Periphery Attached, scattered Reticular degeneration,  pigmented cystoid degeneration temporally, No heme Attached, scattered Reticular degeneration, No heme  ? ?  ?  ? ?  ? ?Refraction   ? ? Wearing Rx   ? ?   Sphere Cylinder Axis Add  ? Right -0.75 +2.25 175 +2.75

## 2021-05-15 ENCOUNTER — Other Ambulatory Visit: Payer: Self-pay

## 2021-05-15 ENCOUNTER — Ambulatory Visit (INDEPENDENT_AMBULATORY_CARE_PROVIDER_SITE_OTHER): Payer: Medicare HMO | Admitting: Ophthalmology

## 2021-05-15 ENCOUNTER — Encounter (INDEPENDENT_AMBULATORY_CARE_PROVIDER_SITE_OTHER): Payer: Self-pay | Admitting: Ophthalmology

## 2021-05-15 DIAGNOSIS — D4989 Neoplasm of unspecified behavior of other specified sites: Secondary | ICD-10-CM | POA: Diagnosis not present

## 2021-05-15 DIAGNOSIS — H353211 Exudative age-related macular degeneration, right eye, with active choroidal neovascularization: Secondary | ICD-10-CM | POA: Diagnosis not present

## 2021-05-15 DIAGNOSIS — H401131 Primary open-angle glaucoma, bilateral, mild stage: Secondary | ICD-10-CM

## 2021-05-15 DIAGNOSIS — Z79899 Other long term (current) drug therapy: Secondary | ICD-10-CM

## 2021-05-15 DIAGNOSIS — H04123 Dry eye syndrome of bilateral lacrimal glands: Secondary | ICD-10-CM

## 2021-05-15 DIAGNOSIS — H353122 Nonexudative age-related macular degeneration, left eye, intermediate dry stage: Secondary | ICD-10-CM

## 2021-05-15 DIAGNOSIS — Z961 Presence of intraocular lens: Secondary | ICD-10-CM

## 2021-05-15 MED ORDER — BEVACIZUMAB CHEMO INJECTION 1.25MG/0.05ML SYRINGE FOR KALEIDOSCOPE
1.2500 mg | INTRAVITREAL | Status: AC | PRN
  Administered 2021-05-15: 1.25 mg via INTRAVITREAL

## 2021-06-05 ENCOUNTER — Telehealth: Payer: Self-pay | Admitting: Family Medicine

## 2021-06-05 ENCOUNTER — Encounter: Payer: Self-pay | Admitting: Family Medicine

## 2021-06-05 ENCOUNTER — Ambulatory Visit (INDEPENDENT_AMBULATORY_CARE_PROVIDER_SITE_OTHER): Payer: Medicare HMO | Admitting: Family Medicine

## 2021-06-05 ENCOUNTER — Other Ambulatory Visit: Payer: Self-pay | Admitting: Family Medicine

## 2021-06-05 VITALS — BP 128/72 | HR 66 | Temp 97.4°F | Ht 60.0 in | Wt 132.0 lb

## 2021-06-05 DIAGNOSIS — G4762 Sleep related leg cramps: Secondary | ICD-10-CM

## 2021-06-05 DIAGNOSIS — M0600 Rheumatoid arthritis without rheumatoid factor, unspecified site: Secondary | ICD-10-CM | POA: Diagnosis not present

## 2021-06-05 DIAGNOSIS — I739 Peripheral vascular disease, unspecified: Secondary | ICD-10-CM

## 2021-06-05 DIAGNOSIS — J302 Other seasonal allergic rhinitis: Secondary | ICD-10-CM

## 2021-06-05 DIAGNOSIS — M359 Systemic involvement of connective tissue, unspecified: Secondary | ICD-10-CM

## 2021-06-05 DIAGNOSIS — M5412 Radiculopathy, cervical region: Secondary | ICD-10-CM

## 2021-06-05 DIAGNOSIS — Z79899 Other long term (current) drug therapy: Secondary | ICD-10-CM | POA: Diagnosis not present

## 2021-06-05 DIAGNOSIS — M353 Polymyalgia rheumatica: Secondary | ICD-10-CM

## 2021-06-05 DIAGNOSIS — E039 Hypothyroidism, unspecified: Secondary | ICD-10-CM

## 2021-06-05 DIAGNOSIS — M810 Age-related osteoporosis without current pathological fracture: Secondary | ICD-10-CM

## 2021-06-05 DIAGNOSIS — E559 Vitamin D deficiency, unspecified: Secondary | ICD-10-CM

## 2021-06-05 LAB — BASIC METABOLIC PANEL
BUN: 16 mg/dL (ref 6–23)
CO2: 29 mEq/L (ref 19–32)
Calcium: 9.6 mg/dL (ref 8.4–10.5)
Chloride: 101 mEq/L (ref 96–112)
Creatinine, Ser: 0.7 mg/dL (ref 0.40–1.20)
GFR: 76.47 mL/min (ref >60.00)
Glucose, Bld: 82 mg/dL (ref 70–99)
Potassium: 3.9 mEq/L (ref 3.5–5.1)
Sodium: 138 mEq/L (ref 135–145)

## 2021-06-05 LAB — TSH: TSH: 3.05 u[IU]/mL (ref 0.35–5.50)

## 2021-06-05 MED ORDER — MONTELUKAST SODIUM 10 MG PO TABS: 10.0000 mg | ORAL_TABLET | Freq: Every day | ORAL | 3 refills | Status: DC

## 2021-06-05 MED ORDER — ALBUTEROL SULFATE HFA 108 (90 BASE) MCG/ACT IN AERS: 2.0000 | INHALATION_SPRAY | Freq: Four times a day (QID) | RESPIRATORY_TRACT | 3 refills | Status: DC | PRN

## 2021-06-05 MED ORDER — PREGABALIN 25 MG PO CAPS: 25.0000 mg | ORAL_CAPSULE | Freq: Every day | ORAL | 1 refills | Status: DC

## 2021-06-05 MED ORDER — LEVOTHYROXINE SODIUM 50 MCG PO TABS: 50.0000 ug | ORAL_TABLET | Freq: Every day | ORAL | 3 refills | Status: DC

## 2021-06-05 NOTE — Progress Notes (Signed)
Refilled lyrica.  

## 2021-06-05 NOTE — Telephone Encounter (Signed)
Please inform patient ?Her kidney function and electrolytes are normal. ?Thyroid panel was normal.  I have refilled her medication. ?

## 2021-06-05 NOTE — Patient Instructions (Signed)
Great to see you today.  I have refilled the medication(s) we provide.   If labs were collected, we will inform you of lab results once received either by echart message or telephone call.   - echart message- for normal results that have been seen by the patient already.   - telephone call: abnormal results or if patient has not viewed results in their echart.  

## 2021-06-05 NOTE — Telephone Encounter (Signed)
LM for pt to return call to discuss.  

## 2021-06-05 NOTE — Progress Notes (Signed)
? ? ? ?This visit occurred during the SARS-CoV-2 public health emergency.  Safety protocols were in place, including screening questions prior to the visit, additional usage of staff PPE, and extensive cleaning of exam room while observing appropriate contact time as indicated for disinfecting solutions.  ? ? ?Margaret [name] , October 12, 1929, 88 y.o., female ?MRN: [id number] ?Patient Care Team  ?  Relationship Specialty Notifications Start End  ?Ma Hillock, DO PCP - General Family Medicine  03/08/19   ?Bernarda Caffey, MD Consulting Physician Ophthalmology  03/29/18   ?Konrad Felix, MD Consulting Physician Ophthalmology  03/29/18   ?Haverstock, Jennefer Bravo, MD Referring Physician Dermatology  08/02/19   ?Valinda Party, MD  Rheumatology  08/02/19   ? ? ?Chief Complaint  ?Patient presents with  ? Hypothyroidism  ?  Cmc; pt is not fasting  ? ?  ?Subjective: Pt presents for an OV for follow up in [facility] ?PMR/RA: Patient reports she is much improved from when she last saw provider.  She is established with rheumatology.  She is on low-dose prednisone as needed, and had been prescribed MTX and  Plaquenil at different times in the past. ?Prior note: ?Patient presents today to discuss her arthralgias and myalgias.  Symptoms are worsening.  She reports increased symptoms in her left shoulder her left hand and her left hip.  Her symptoms are bilateral, worse on the left side. ?Her CMP, CRP, ESR, TSH, CBC and Lyme titers were all normal. ?Her ANA was positive, 1:640 nuclear homogenous. CCP and RF normal. ? ?Hypothyroidism, unspecified type ?Patient reports compliance with levothyroxine 50 mcg daily.   ? ?Nocturnal leg cramps/RLS ?Patient reports the leg cramps have improved with the use of Lyrica.. She is compliant with Lyrica.  ?Prior note: ?Patient reports he had forgotten about the Mirapex she had been on.  She would like to restart this medication.  She has been prescribed Mirapex 0.25 mg nightly by her prior PCP for nocturnal leg  cramping.  She feels this has worked well for her. ?Vitamin D insufficiency: pt is supplementing.  ? ?Seasonal allergies/Mild intermittent asthma, unspecified whether complicated ?Patient reports she has asthma and allergies.    Patient reports compliance  with Singulair and her symptoms are well controlled.  She also uses Flonase and albuterol as needed.   ? ?  12/09/2020  ?  9:53 AM 10/22/2020  ?  1:13 PM 03/19/2020  ? 11:32 AM 03/29/2018  ? 10:20 AM  ?Depression screen PHQ 2/9  ?Decreased Interest 0 0  0  ?Down, Depressed, Hopeless 0 0 0 0  ?PHQ - 2 Score 0 0 0 0  ? ? ?Allergies  ?Allergen Reactions  ? Augmentin [Amoxicillin-Pot Clavulanate] Swelling and Rash  ?  Swelling in face  ? Lactose Intolerance (Gi)   ? ?Social History  ? ?Social History Narrative  ? Marital status/children/pets: Widowed.  Moved to [place] from [place] area) 2019.  She shares a home with 1 of her daughters.  Has 1 son and 2 daughters  ? Education/employment: Buyer, retail of arts degree, retired Pharmacist, hospital  ? Safety:   ?   -smoke alarm in the home:Yes  ?   - wears seatbelt: Yes  ?   - Feels safe in their relationships: Yes  ?   ? 2 caffeinated beverages a day no alcohol tobacco or drug use  ? ?Past Medical History:  ?Diagnosis Date  ? Allergy   ? Arthritis of left knee   ? Asthma   ?  Carotid artery stenosis 10/17/2012  ? stable report in 2018- "moderate on right, minimal on left"  ? Carpal tunnel syndrome 2018  ? left.  ? Cervical radiculopathy 05/16/2015  ? DDD C4-C5, C5-C6, and C6-C7.  Facet arthropathy throughout cervical spine.  Normal foraminal narrowing secondary to uncovertebral  arthropathy is seen bilaterally at C3-C4 and C5/C6  ? Chicken pox   ? DDD (degenerative disc disease), lumbar 04/26/2015  ? Mild levoscoliosis, right anterior listhesis of L4 and L5.  DDD at every level.  Facet arthropathy at multiple levels.  ? Deviated septum   ? Glaucoma   ? both eyes, mild opened angle  ? Heart murmur   ? History of colon polyps    ? History of fall   ? History of frequent urinary tract infections   ? klebs- pansensitive. c. freundii - resitant to cefazolin and augementin  ? Hypothyroidism   ? Intra-abdominal abscess ([place]) 03/10/2020  ? Iron deficiency anemia   ? prior pcp told her to take Fe 325 QD  ? Macular degeneration   ? h/o retinal edema  ? Mitral valve prolapse 08/27/2011  ? Murmur, cardiac 10/17/2012  ? Osteoarthritis   ? Osteoporosis   ? Pneumonia   ? 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 Pseudophakia of both eyes   ? PVD (peripheral vascular disease) ([place])   ? Rheumatic fever   ? 88 years old   ? Syncope 05/17/2017  ? Thyroid disease   ? Urinary incontinence   ? Vertigo   ? had been prescribed antivert  ? Vitamin D deficiency   ? ?Past Surgical History:  ?Procedure Laterality Date  ? CARPAL TUNNEL RELEASE Bilateral 2019  ? CATARACT EXTRACTION Bilateral 1997  ? COLONOSCOPY  2010  ? NASAL SEPTUM SURGERY  2007  ? WRIST FRACTURE SURGERY Right 1996  ? ?Family History  ?Problem Relation Age of Onset  ? Arthritis Mother   ? COPD Mother   ? Prostate cancer Father   ? Arthritis Father   ? Hearing loss Father   ? Heart disease Father   ? Heart attack Father   ? Stroke Maternal Grandmother   ? Liver cancer Sister   ? Arthritis Brother   ? Prostate cancer Brother   ? COPD Brother   ? Heart disease Brother   ? Breast cancer Daughter   ? Blindness Maternal Grandfather   ? Stroke Maternal Grandfather   ? COPD Paternal Grandmother   ? Breast cancer Paternal Grandmother   ? ?Allergies as of 06/05/2021   ? ?   Reactions  ? Augmentin [amoxicillin-pot Clavulanate] Swelling, Rash  ? Swelling in face  ? Lactose Intolerance (gi)   ? ?  ? ?  ?Medication List  ?  ? ?  ? Accurate as of June 05, 2021  5:09 PM. If you have any questions, ask your nurse or doctor.  ?  ?  ? ?  ? ?albuterol 108 (90 Base) MCG/ACT inhaler ?Commonly known as: VENTOLIN HFA ?Inhale 2 puffs into the lungs every 6 (six) hours as needed for wheezing or shortness of breath. ?  ?CALCIUM  PO ?Take 1 tablet by mouth 2 (two) times daily. Slow release ?  ?folic acid 0.5 MG tablet ?Commonly known as: FOLVITE ?Take 0.5 mg by mouth daily. ?  ?lactase 3000 units tablet ?Commonly known as: LACTAID ?Take 2 tablets (6,000 Units total) by mouth 3 (three) times daily as needed (lactose intolerance). ?  ?levothyroxine 50 MCG  tablet ?Commonly known as: SYNTHROID ?Take 1 tablet (50 mcg total) by mouth daily before breakfast. ?  ?montelukast 10 MG tablet ?Commonly known as: SINGULAIR ?Take 1 tablet (10 mg total) by mouth at bedtime. ?  ?polyvinyl alcohol 1.4 % ophthalmic solution ?Commonly known as: LIQUIFILM TEARS ?Place 1 drop into both eyes as needed for dry eyes. ?  ?predniSONE 5 MG tablet ?Commonly known as: DELTASONE ?Take 5 mg by mouth daily as needed (joint/muscle pain). ?  ?pregabalin 25 MG capsule ?Commonly known as: Lyrica ?Take 1 capsule (25 mg total) by mouth at bedtime. ?  ?PreserVision AREDS 2 Caps ?Take 1 capsule by mouth 2 (two) times daily. ?  ? ?  ? ? ?All past medical history, surgical history, allergies, family history, immunizations andmedications were updated in the EMR today and reviewed under the history and medication portions of their EMR.    ? ?ROS: Negative, with the exception of above mentioned in HPI ? ?Objective:  ?BP 128/72   Pulse 66   Temp (!) 97.4 ?F (36.3 ?C) (Oral)   Ht 5' (1.524 m)   Wt 132 lb (59.9 kg)   SpO2 98%   BMI 25.78 kg/m?  ?Body mass index is 25.78 kg/m?Marland Kitchen ?Physical Exam ?Vitals and nursing note reviewed.  ?Constitutional:   ?   General: She is not in acute distress. ?   Appearance: Normal appearance. She is not ill-appearing, toxic-appearing or diaphoretic.  ?HENT:  ?   Head: Normocephalic and atraumatic.  ?   Mouth/Throat:  ?   Mouth: Mucous membranes are moist.  ?Eyes:  ?   General: No scleral icterus.    ?   Right eye: No discharge.     ?   Left eye: No discharge.  ?   Extraocular Movements: Extraocular movements intact.  ?   Conjunctiva/sclera: Conjunctivae  normal.  ?   Pupils: Pupils are equal, round, and reactive to light.  ?Cardiovascular:  ?   Rate and Rhythm: Normal rate and regular rhythm.  ?Pulmonary:  ?   Effort: Pulmonary effort is normal. No respiratory

## 2021-06-08 ENCOUNTER — Telehealth: Payer: Self-pay

## 2021-06-08 NOTE — Telephone Encounter (Signed)
Prescription was given to patient on Friday 4/14. Wants to clarify if she was to receive 3 inhalers. ? ?Please call [phone] ?

## 2021-06-08 NOTE — Telephone Encounter (Signed)
Informed pt that we sent 1 inhaler with 3 refills ?

## 2021-06-08 NOTE — Telephone Encounter (Signed)
Spoke with pt regarding labs and instructions.   

## 2021-06-23 NOTE — Progress Notes (Incomplete)
?[facility] Note ? ?06/29/2021 ? ?  ? ?CHIEF COMPLAINT ?Patient presents for No chief complaint on file. ? ? ?HISTORY OF PRESENT ILLNESS: ?Margaret [name] is a 88 y.o. female who presents to the clinic today for:  ? ? ? ?Pt states Dr. Lucianne Lei has put her on Restasis for dry eyes ? ?Referring physician: ?[name], [name], DO ?[address] [place],  [place] [address] ? ?HISTORICAL INFORMATION:  ?Selected notes from the [facility] ?Self referral for macular degeneration ?LEE: 03.21.19 (Dr. Park Liter in [place], [place]) [place]: OD: 20/40 OS: 20/40] ?Ocular Hx-POAG, non-exu ARMD, DES, pseudo, YAG ?PMH-astham, arthritis,   ? ?CURRENT MEDICATIONS: ?Current Outpatient Medications (Ophthalmic Drugs)  ?Medication Sig  ? polyvinyl alcohol (LIQUIFILM TEARS) 1.4 % ophthalmic solution Place 1 drop into both eyes as needed for dry eyes.  ? ?No current facility-administered medications for this visit. (Ophthalmic Drugs)  ? ?Current Outpatient Medications (Other)  ?Medication Sig  ? albuterol (VENTOLIN HFA) 108 (90 Base) MCG/ACT inhaler Inhale 2 puffs into the lungs every 6 (six) hours as needed for wheezing or shortness of breath.  ? CALCIUM PO Take 1 tablet by mouth 2 (two) times daily. Slow release  ? folic acid (FOLVITE) 0.5 MG tablet Take 0.5 mg by mouth daily.  ? lactase (LACTAID) 3000 units tablet Take 2 tablets (6,000 Units total) by mouth 3 (three) times daily as needed (lactose intolerance).  ? levothyroxine (SYNTHROID) 50 MCG tablet Take 1 tablet (50 mcg total) by mouth daily before breakfast.  ? montelukast (SINGULAIR) 10 MG tablet Take 1 tablet (10 mg total) by mouth at bedtime.  ? Multiple Vitamins-Minerals (PRESERVISION AREDS 2) CAPS Take 1 capsule by mouth 2 (two) times daily.  ? predniSONE (DELTASONE) 5 MG tablet Take 5 mg by mouth daily as needed (joint/muscle pain).  ? pregabalin (LYRICA) 25 MG capsule Take 1 capsule (25 mg total) by mouth at bedtime.  ? ?No current facility-administered  medications for this visit. (Other)  ? ?REVIEW OF SYSTEMS: ? ? ? ?ALLERGIES ?Allergies  ?Allergen Reactions  ? Augmentin [Amoxicillin-Pot Clavulanate] Swelling and Rash  ?  Swelling in face  ? Lactose Intolerance (Gi)   ? ?PAST MEDICAL HISTORY ?Past Medical History:  ?Diagnosis Date  ? Allergy   ? Arthritis of left knee   ? Asthma   ? Carotid artery stenosis 10/17/2012  ? stable report in 2018- "moderate on right, minimal on left"  ? Carpal tunnel syndrome 2018  ? left.  ? Cervical radiculopathy 05/16/2015  ? DDD C4-C5, C5-C6, and C6-C7.  Facet arthropathy throughout cervical spine.  Normal foraminal narrowing secondary to uncovertebral  arthropathy is seen bilaterally at C3-C4 and C5/C6  ? Chicken pox   ? DDD (degenerative disc disease), lumbar 04/26/2015  ? Mild levoscoliosis, right anterior listhesis of L4 and L5.  DDD at every level.  Facet arthropathy at multiple levels.  ? Deviated septum   ? Glaucoma   ? both eyes, mild opened angle  ? Heart murmur   ? History of colon polyps   ? History of fall   ? History of frequent urinary tract infections   ? klebs- pansensitive. c. freundii - resitant to cefazolin and augementin  ? Hypothyroidism   ? Intra-abdominal abscess ([place]) 03/10/2020  ? Iron deficiency anemia   ? prior pcp told her to take Fe 325 QD  ? Macular degeneration   ? h/o retinal edema  ? Mitral valve prolapse 08/27/2011  ? Murmur,  cardiac 10/17/2012  ? Osteoarthritis   ? Osteoporosis   ? Pneumonia   ? 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 Pseudophakia of both eyes   ? PVD (peripheral vascular disease) ([place])   ? Rheumatic fever   ? 88 years old   ? Syncope 05/17/2017  ? Thyroid disease   ? Urinary incontinence   ? Vertigo   ? had been prescribed antivert  ? Vitamin D deficiency   ? ?Past Surgical History:  ?Procedure Laterality Date  ? CARPAL TUNNEL RELEASE Bilateral 2019  ? CATARACT EXTRACTION Bilateral 1997  ? COLONOSCOPY  2010  ? NASAL SEPTUM SURGERY  2007  ? WRIST FRACTURE SURGERY Right 1996  ? ?FAMILY  HISTORY ?Family History  ?Problem Relation Age of Onset  ? Arthritis Mother   ? COPD Mother   ? Prostate cancer Father   ? Arthritis Father   ? Hearing loss Father   ? Heart disease Father   ? Heart attack Father   ? Stroke Maternal Grandmother   ? Liver cancer Sister   ? Arthritis Brother   ? Prostate cancer Brother   ? COPD Brother   ? Heart disease Brother   ? Breast cancer Daughter   ? Blindness Maternal Grandfather   ? Stroke Maternal Grandfather   ? COPD Paternal Grandmother   ? Breast cancer Paternal Grandmother   ? ? ?SOCIAL HISTORY ?Social History  ? ?Tobacco Use  ? Smoking status: Former  ?  Packs/day: 0.50  ?  Types: Cigarettes  ?  Start date: 45  ?  Quit date: 32  ?  Years since quitting: 58.3  ? Smokeless tobacco: Never  ?Vaping Use  ? Vaping Use: Never used  ?Substance Use Topics  ? Alcohol use: Not Currently  ?  Alcohol/week: 5.0 standard drinks  ?  Types: 5 Standard drinks or equivalent per week  ? Drug use: Never  ?  ? ?  ?OPHTHALMIC EXAM: ? ?Not recorded ?  ? ?IMAGING AND PROCEDURES  ?Imaging and Procedures for '@TODAY'$ @ ? ? ?  ?  ? ?  ?ASSESSMENT/PLAN: ? ?  ICD-10-CM   ?1. Exudative age-related macular degeneration of right eye with active choroidal neovascularization ([place])  H35.3211   ?  ?2. Intermediate stage nonexudative age-related macular degeneration of left eye  H35.3122   ?  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  ?4. Long-term use of Plaquenil  Z79.899   ?  ?5. Pseudophakia of both eyes  Z96.1   ?  ?6. Primary open angle glaucoma (POAG) of both eyes, mild stage  H40.1131   ?  ?7. Dry eyes  H04.123   ?  ? ? ?1. Exudative age related macular degeneration, OD ? - conversion from nonexudative ARMD noted on 9.23.22 visit ? - s/p IVA OD #1 (09.23.22), #2 (10.21.22), #3 (11.18.22), #4 (12.30.22), #5 (02.13.23), #6 (03.24.23) ?- BCVA 20/40 ?- OCT shows OD: mild improvement in cystic changes, Partial PVD at 6 weeks ?- recommend IVA OD #7 today, 05.08.23 continue with 6 week f/u ?- pt wishes to be treated  with IVA ?- RBA of procedure discussed, questions answered ?- informed consent obtained and signed ?- see procedure note ?- f/u in 6 wks -- DFE/OCT, possible injection  ? ?2. Age related macular degeneration, non-exudative OS ? - intermediate stage-no change from previous visit ? - The incidence, anatomy, and pathology of dry AMD, risk of progression, and the AREDS and AREDS 2 study including  smoking risks discussed with patient.  ? - recommended Amsler Grid monitoring ? ?3. Iris/ciliary body mass OS ? - pigmented lesion behind iris but anterior to PCIOL at 0730 and 0300 ? - 0730 lesion spans 0630 to 0830 behind dilated iris and larger than 0300 lesion ? - patient asymptomatic ? - pt saw Dr. Dow Adolph, [facility] on 11.01.22 who dx her with iris margin cyst ? ?4. Plaquenil use for RA ? - baseline testing done 05.20.20 -- started plaquenil early May 2020 for RA ? - HVF 10-2 showed non-specific defects OU -- essentially normal baseline test ? - OCT shows no obvious plaquenil-related changes, but interval development of exudative ARMD OD as above ? - currently taking '200mg'$  / day per Dr. Audelia Hives instructions --> 3.29 mg/kg body wt per day ? - po prednisone, currently as needed which is about once a week ?- and is also now on '10mg'$  po MTX weekly + folic acid ? - the American Academy of Ophthalmology recommends dosing '5mg'$ /kg per day to reduce risk of retinal toxicity  ? - recommend consideration of using alternate medication for RA especially given co-morbidity of age-related macular degeneration OU ? - managed by Dr. Dossie Der at [facility] ? ?5. Pseudophakia OU ? - s/p CE/IOL w/ ?canaloplasty OU by Dr. Robyne Askew at [facility] ? - doing well ? - monitor ? ?6. History of Glaucoma  ? - not currently on gtts -- IOP 15 OU ? - ?underwent canaloplasty in conjunction with CEIOL at [facility]  ? - referred to and now under the expert care of Dr. Kathlen Mody -- initial visit/consult  10.3.2019 ? ?7. Dry Eye Syndrome ? - use AT's OU as needed ? ?Ophthalmic Meds Ordered this visit:  ?No orders of the defined types were placed in this encounter. ? ?  ? ?No follow-ups on file. ? ?There are no Patient Instr

## 2021-06-29 ENCOUNTER — Encounter (INDEPENDENT_AMBULATORY_CARE_PROVIDER_SITE_OTHER): Payer: Self-pay | Admitting: Ophthalmology

## 2021-06-29 ENCOUNTER — Ambulatory Visit (INDEPENDENT_AMBULATORY_CARE_PROVIDER_SITE_OTHER): Payer: Medicare HMO | Admitting: Ophthalmology

## 2021-06-29 DIAGNOSIS — D4989 Neoplasm of unspecified behavior of other specified sites: Secondary | ICD-10-CM | POA: Diagnosis not present

## 2021-06-29 DIAGNOSIS — Z961 Presence of intraocular lens: Secondary | ICD-10-CM

## 2021-06-29 DIAGNOSIS — H04123 Dry eye syndrome of bilateral lacrimal glands: Secondary | ICD-10-CM

## 2021-06-29 DIAGNOSIS — Z79899 Other long term (current) drug therapy: Secondary | ICD-10-CM | POA: Diagnosis not present

## 2021-06-29 DIAGNOSIS — H353122 Nonexudative age-related macular degeneration, left eye, intermediate dry stage: Secondary | ICD-10-CM | POA: Diagnosis not present

## 2021-06-29 DIAGNOSIS — H401131 Primary open-angle glaucoma, bilateral, mild stage: Secondary | ICD-10-CM

## 2021-06-29 DIAGNOSIS — H353211 Exudative age-related macular degeneration, right eye, with active choroidal neovascularization: Secondary | ICD-10-CM | POA: Diagnosis not present

## 2021-06-29 NOTE — Progress Notes (Addendum)
?[facility] Note ? ?06/29/2021 ? ?  ? ?CHIEF COMPLAINT ?Patient presents for Retina Follow Up ? ? ?HISTORY OF PRESENT ILLNESS: ?Margaret [name] is a 88 y.o. female who presents to the clinic today for:  ? ?HPI   ? ? Retina Follow Up   ?Patient presents with  Wet AMD (IVA OD 03.24.23).  In right eye.  This started years ago.  Duration of 6 weeks.  Since onset it is stable.  I, the attending physician,  performed the HPI with the patient and updated documentation appropriately. ? ?  ?  ? ? Comments   ?Patient feels like the vision is not getting better. The eyes are watering and burn. She is using AT's OU PRN and Zaditor.  ? ?  ?  ?Last edited by Bernarda Caffey, MD on 06/30/2021  4:06 PM.  ?  ? ?Referring physician: ?[name], [name], DO ?[address] [place],  [place] [address] ? ?HISTORICAL INFORMATION:  ?Selected notes from the [facility] ?Self referral for macular degeneration ?LEE: 03.21.19 (Dr. Park Liter in [place], [place]) [place]: OD: 20/40 OS: 20/40] ?Ocular Hx-POAG, non-exu ARMD, DES, pseudo, YAG ?PMH-astham, arthritis,   ? ?CURRENT MEDICATIONS: ?Current Outpatient Medications (Ophthalmic Drugs)  ?Medication Sig  ? polyvinyl alcohol (LIQUIFILM TEARS) 1.4 % ophthalmic solution Place 1 drop into both eyes as needed for dry eyes.  ? ?No current facility-administered medications for this visit. (Ophthalmic Drugs)  ? ?Current Outpatient Medications (Other)  ?Medication Sig  ? albuterol (VENTOLIN HFA) 108 (90 Base) MCG/ACT inhaler Inhale 2 puffs into the lungs every 6 (six) hours as needed for wheezing or shortness of breath.  ? CALCIUM PO Take 1 tablet by mouth 2 (two) times daily. Slow release  ? folic acid (FOLVITE) 0.5 MG tablet Take 0.5 mg by mouth daily.  ? lactase (LACTAID) 3000 units tablet Take 2 tablets (6,000 Units total) by mouth 3 (three) times daily as needed (lactose intolerance).  ? levothyroxine (SYNTHROID) 50 MCG tablet Take 1 tablet (50 mcg total) by mouth daily before  breakfast.  ? montelukast (SINGULAIR) 10 MG tablet Take 1 tablet (10 mg total) by mouth at bedtime.  ? Multiple Vitamins-Minerals (PRESERVISION AREDS 2) CAPS Take 1 capsule by mouth 2 (two) times daily.  ? predniSONE (DELTASONE) 5 MG tablet Take 5 mg by mouth daily as needed (joint/muscle pain).  ? pregabalin (LYRICA) 25 MG capsule Take 1 capsule (25 mg total) by mouth at bedtime.  ? ?No current facility-administered medications for this visit. (Other)  ? ?REVIEW OF SYSTEMS: ?ROS   ?Positive for: Musculoskeletal, Endocrine, Eyes ?Negative for: Constitutional, Gastrointestinal, Neurological, Skin, Genitourinary, HENT, Cardiovascular, Respiratory, Psychiatric, Allergic/Imm, Heme/Lymph ?Last edited by Annie Paras, COT on 06/29/2021  1:17 PM.  ?  ? ?ALLERGIES ?Allergies  ?Allergen Reactions  ? Augmentin [Amoxicillin-Pot Clavulanate] Swelling and Rash  ?  Swelling in face  ? Lactose Intolerance (Gi)   ? ?PAST MEDICAL HISTORY ?Past Medical History:  ?Diagnosis Date  ? Allergy   ? Arthritis of left knee   ? Asthma   ? Carotid artery stenosis 10/17/2012  ? stable report in 2018- "moderate on right, minimal on left"  ? Carpal tunnel syndrome 2018  ? left.  ? Cervical radiculopathy 05/16/2015  ? DDD C4-C5, C5-C6, and C6-C7.  Facet arthropathy throughout cervical spine.  Normal foraminal narrowing secondary to uncovertebral  arthropathy is seen bilaterally at C3-C4 and C5/C6  ? Chicken pox   ? DDD (degenerative disc  disease), lumbar 04/26/2015  ? Mild levoscoliosis, right anterior listhesis of L4 and L5.  DDD at every level.  Facet arthropathy at multiple levels.  ? Deviated septum   ? Glaucoma   ? both eyes, mild opened angle  ? Heart murmur   ? History of colon polyps   ? History of fall   ? History of frequent urinary tract infections   ? klebs- pansensitive. c. freundii - resitant to cefazolin and augementin  ? Hypothyroidism   ? Intra-abdominal abscess ([place]) 03/10/2020  ? Iron deficiency anemia   ? prior pcp told  her to take Fe 325 QD  ? Macular degeneration   ? h/o retinal edema  ? Mitral valve prolapse 08/27/2011  ? Murmur, cardiac 10/17/2012  ? Osteoarthritis   ? Osteoporosis   ? Pneumonia   ? 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 Pseudophakia of both eyes   ? PVD (peripheral vascular disease) ([place])   ? Rheumatic fever   ? 88 years old   ? Syncope 05/17/2017  ? Thyroid disease   ? Urinary incontinence   ? Vertigo   ? had been prescribed antivert  ? Vitamin D deficiency   ? ?Past Surgical History:  ?Procedure Laterality Date  ? CARPAL TUNNEL RELEASE Bilateral 2019  ? CATARACT EXTRACTION Bilateral 1997  ? COLONOSCOPY  2010  ? NASAL SEPTUM SURGERY  2007  ? WRIST FRACTURE SURGERY Right 1996  ? ?FAMILY HISTORY ?Family History  ?Problem Relation Age of Onset  ? Arthritis Mother   ? COPD Mother   ? Prostate cancer Father   ? Arthritis Father   ? Hearing loss Father   ? Heart disease Father   ? Heart attack Father   ? Stroke Maternal Grandmother   ? Liver cancer Sister   ? Arthritis Brother   ? Prostate cancer Brother   ? COPD Brother   ? Heart disease Brother   ? Breast cancer Daughter   ? Blindness Maternal Grandfather   ? Stroke Maternal Grandfather   ? COPD Paternal Grandmother   ? Breast cancer Paternal Grandmother   ? ?SOCIAL HISTORY ?Social History  ? ?Tobacco Use  ? Smoking status: Former  ?  Packs/day: 0.50  ?  Types: Cigarettes  ?  Start date: 62  ?  Quit date: 66  ?  Years since quitting: 58.3  ? Smokeless tobacco: Never  ?Vaping Use  ? Vaping Use: Never used  ?Substance Use Topics  ? Alcohol use: Not Currently  ?  Alcohol/week: 5.0 standard drinks  ?  Types: 5 Standard drinks or equivalent per week  ? Drug use: Never  ?  ? ?  ?OPHTHALMIC EXAM: ? ?Base Eye Exam   ? ? Visual Acuity (Snellen - Linear)   ? ?   Right Left  ? Dist cc 20/50 20/30  ? Dist ph cc NI NI  ? ? Correction: Glasses  ? ?  ?  ? ? Tonometry (Tonopen, 1:21 PM)   ? ?   Right Left  ? Pressure 13 15  ? ?  ?  ? ? Pupils   ? ?   Dark Light Shape React APD   ? Right 3 2 Round Brisk None  ? Left 3 2 Round Brisk None  ? ?  ?  ? ? Visual Fields   ? ?   Left Right  ?  Full Full  ? ?  ?  ? ? Extraocular Movement   ? ?  Right Left  ?  Full Full  ? ?  ?  ? ? Neuro/Psych   ? ? Oriented x3: Yes  ? Mood/Affect: Normal  ? ?  ?  ? ? Dilation   ? ? Both eyes: 1.0% Mydriacyl, 2.5% Phenylephrine @ 1:19 PM  ? ?  ?  ? ?  ? ?Slit Lamp and Fundus Exam   ? ? Slit Lamp Exam   ? ?   Right Left  ? Lids/Lashes Dermatochalasis - upper lid, Telangiectasia Dermatochalasis - upper lid, mild Meibomian gland dysfunction  ? Conjunctiva/Sclera Temporal Pinguecula White and quiet  ? Cornea Arcus, 1+ Punctate epithelial erosions, mild tear film debris, fine endo pigment Arcus, 1-2+ inferior Punctate epithelial erosions, mild tear film debris, fine endo pigment  ? Anterior Chamber Deep and quiet, narrow temporal angle Deep and quiet  ? Iris Round and dilated Round and dilated, pigmented mass 0730 behind iris, anterior to IOL  ? Lens PC IOL in good position with open PC PC IOL in good position with open PC  ? Anterior Vitreous Vitreous syneresis Vitreous syneresis, Posterior vitreous detachment  ? ?  ?  ? ? Fundus Exam   ? ?   Right Left  ? Disc 360 Peripapillary atrophy, Sharp rim, mild Pallor sharp rim, temporal PPA, mild Pallor  ? C/D Ratio 0.6 0.3  ? Macula Flat, Blunted foveal reflex, drusen, RPE mottling and clumping, +central IRF / cystic changes, punctate IRH -- resolved, no SRH, Early RPE atrophy Flat, Blunted foveal reflex, drusen, RPE mottling and clumping, no heme or edema  ? Vessels attenuated, Tortuous attenuated, Tortuous  ? Periphery Attached, scattered Reticular degeneration, pigmented cystoid degeneration temporally, No heme Attached, scattered Reticular degeneration, No heme  ? ?  ?  ? ?  ? ?Refraction   ? ? Wearing Rx   ? ?   Sphere Cylinder Axis Add  ? Right -0.75 +2.25 175 +2.75  ? Left -0.25 +1.25 165 +2.75  ? ?  ?  ? ?  ? ?IMAGING AND PROCEDURES  ?Imaging and Procedures for  '@TODAY'$ @ ? ?OCT, Retina - OU - Both Eyes   ? ?   ?Right Eye ?Quality was good. Central Foveal Thickness: 318. Progression has worsened. Findings include no SRF, retinal drusen , outer retinal atrophy, epiretinal me

## 2021-06-30 ENCOUNTER — Encounter (INDEPENDENT_AMBULATORY_CARE_PROVIDER_SITE_OTHER): Payer: Self-pay | Admitting: Ophthalmology

## 2021-06-30 MED ORDER — BEVACIZUMAB CHEMO INJECTION 1.25MG/0.05ML SYRINGE FOR KALEIDOSCOPE
1.2500 mg | INTRAVITREAL | Status: AC | PRN
  Administered 2021-06-30: 1.25 mg via INTRAVITREAL

## 2021-07-16 ENCOUNTER — Ambulatory Visit (INDEPENDENT_AMBULATORY_CARE_PROVIDER_SITE_OTHER): Payer: Medicare HMO | Admitting: Family Medicine

## 2021-07-16 ENCOUNTER — Encounter: Payer: Self-pay | Admitting: Family Medicine

## 2021-07-16 VITALS — BP 137/76 | HR 72 | Temp 97.5°F | Ht 60.0 in

## 2021-07-16 DIAGNOSIS — R300 Dysuria: Secondary | ICD-10-CM

## 2021-07-16 DIAGNOSIS — N390 Urinary tract infection, site not specified: Secondary | ICD-10-CM

## 2021-07-16 DIAGNOSIS — R39198 Other difficulties with micturition: Secondary | ICD-10-CM | POA: Diagnosis not present

## 2021-07-16 LAB — POCT URINALYSIS DIPSTICK
Bilirubin, UA: NEGATIVE
Blood, UA: NEGATIVE
Glucose, UA: NEGATIVE
Ketones, UA: NEGATIVE
Leukocytes, UA: NEGATIVE
Nitrite, UA: NEGATIVE
Protein, UA: NEGATIVE
Spec Grav, UA: <=1.005 — AB (ref 1.010–1.025)
Urobilinogen, UA: 0.2 E.U./dL
pH, UA: 6 (ref 5.0–8.0)

## 2021-07-16 MED ORDER — CIPROFLOXACIN HCL 500 MG PO TABS
500.0000 mg | ORAL_TABLET | Freq: Two times a day (BID) | ORAL | 0 refills | Status: AC
Start: 2021-07-16 — End: 2021-07-19

## 2021-07-16 NOTE — Patient Instructions (Signed)
Return if symptoms worsen or fail to improve.        Great to see you today.  I have refilled the medication(s) we provide.   If labs were collected, we will inform you of lab results once received either by echart message or telephone call.   - echart message- for normal results that have been seen by the patient already.   - telephone call: abnormal results or if patient has not viewed results in their echart.  Urinary Tract Infection, Adult A urinary tract infection (UTI) is an infection of any part of the urinary tract. The urinary tract includes: The kidneys. The ureters. The bladder. The urethra. These organs make, store, and get rid of pee (urine) in the body. What are the causes? This infection is caused by germs (bacteria) in your genital area. These germs grow and cause swelling (inflammation) of your urinary tract. What increases the risk? The following factors may make you more likely to develop this condition: Using a small, thin tube (catheter) to drain pee. Not being able to control when you pee or poop (incontinence). Being female. If you are female, these things can increase the risk: Using these methods to prevent pregnancy: A medicine that kills sperm (spermicide). A device that blocks sperm (diaphragm). Having low levels of a female hormone (estrogen). Being pregnant. You are more likely to develop this condition if: You have genes that add to your risk. You are sexually active. You take antibiotic medicines. You have trouble peeing because of: A prostate that is bigger than normal, if you are female. A blockage in the part of your body that drains pee from the bladder. A kidney stone. A nerve condition that affects your bladder. Not getting enough to drink. Not peeing often enough. You have other conditions, such as: Diabetes. A weak disease-fighting system (immune system). Sickle cell disease. Gout. Injury of the spine. What are the signs or  symptoms? Symptoms of this condition include: Needing to pee right away. Peeing small amounts often. Pain or burning when peeing. Blood in the pee. Pee that smells bad or not like normal. Trouble peeing. Pee that is cloudy. Fluid coming from the vagina, if you are female. Pain in the belly or lower back. Other symptoms include: Vomiting. Not feeling hungry. Feeling mixed up (confused). This may be the first symptom in older adults. Being tired and grouchy (irritable). A fever. Watery poop (diarrhea). How is this treated? Taking antibiotic medicine. Taking other medicines. Drinking enough water. In some cases, you may need to see a specialist. Follow these instructions at home:  Medicines Take over-the-counter and prescription medicines only as told by your doctor. If you were prescribed an antibiotic medicine, take it as told by your doctor. Do not stop taking it even if you start to feel better. General instructions Make sure you: Pee until your bladder is empty. Do not hold pee for a long time. Empty your bladder after sex. Wipe from front to back after peeing or pooping if you are a female. Use each tissue one time when you wipe. Drink enough fluid to keep your pee pale yellow. Keep all follow-up visits. Contact a doctor if: You do not get better after 1-2 days. Your symptoms go away and then come back. Get help right away if: You have very bad back pain. You have very bad pain in your lower belly. You have a fever. You have chills. You feeling like you will vomit or you vomit. Summary A  urinary tract infection (UTI) is an infection of any part of the urinary tract. This condition is caused by germs in your genital area. There are many risk factors for a UTI. Treatment includes antibiotic medicines. Drink enough fluid to keep your pee pale yellow. This information is not intended to replace advice given to you by your health care provider. Make sure you discuss  any questions you have with your health care provider. Document Revised: 09/21/2019 Document Reviewed: 09/21/2019 Elsevier Patient Education  [place].

## 2021-07-16 NOTE — Progress Notes (Signed)
Margaret [name] , Jan 21, 1930, 88 y.o., female MRN: [id number] Patient Care Team    Relationship Specialty Notifications Start End  Ma Hillock, DO PCP - General Family Medicine  03/08/19   Bernarda Caffey, MD Consulting Physician Ophthalmology  03/29/18   Konrad Felix, MD Consulting Physician Ophthalmology  03/29/18   Devra Dopp, MD Referring Physician Dermatology  08/02/19   Valinda Party, MD  Rheumatology  08/02/19     Chief Complaint  Patient presents with   [name]    Margaret [name], Margaret x 3 days;      Subjective: Margaret presents for an OV with complaints of [name] of 3 days duration.  Associated symptoms include chills.  H/o urcx pansensitive K. PNA Took left over cirpo from other provider. Started on Monday BID.      12/09/2020    9:53 AM 10/22/2020    1:13 PM 03/19/2020   11:32 AM 03/29/2018   10:20 AM  Depression screen PHQ 2/9  Decreased Interest 0 0  0  Down, Depressed, Hopeless 0 0 0 0  PHQ - 2 Score 0 0 0 0    Allergies  Allergen Reactions   Augmentin [Amoxicillin-Pot Clavulanate] Swelling and Rash    Swelling in face   Lactose Intolerance (Gi)    Social History   Social History Narrative   Marital status/children/pets: Widowed.  Moved to [place] from [place] area) 2019.  Margaret [name] shares a home with 1 of her daughters.  Has 1 son and 2 daughters   Education/employment: Buyer, retail of arts degree, retired Marine scientist:      -smoke alarm in the home:Yes     - wears seatbelt: Yes     - Feels safe in their relationships: Yes      2 caffeinated beverages a day no alcohol tobacco or drug use   Past Medical History:  Diagnosis Date   Allergy    Arthritis of left knee    Asthma    Carotid artery stenosis 10/17/2012   stable report in 2018- "moderate on right, minimal on left"   Carpal tunnel syndrome 2018   left.   Cervical radiculopathy 05/16/2015   DDD C4-C5, C5-C6, and C6-C7.  Facet arthropathy throughout cervical spine.   Normal foraminal narrowing secondary to uncovertebral  arthropathy is seen bilaterally at C3-C4 and C5/C6   Chicken pox    DDD (degenerative disc disease), lumbar 04/26/2015   Mild levoscoliosis, right anterior listhesis of L4 and L5.  DDD at every level.  Facet arthropathy at multiple levels.   Deviated septum    Glaucoma    both eyes, mild opened angle   Heart murmur    History of colon polyps    History of fall    History of frequent urinary tract infections    klebs- pansensitive. c. freundii - resitant to cefazolin and augementin   Hypothyroidism    Intra-abdominal abscess ([place]) 03/10/2020   Iron deficiency anemia    prior pcp told her to take Fe 325 QD   Macular degeneration    h/o retinal edema   Mitral valve prolapse 08/27/2011   Murmur, cardiac 10/17/2012   Osteoarthritis    Osteoporosis    Pneumonia    Positive TB test    In college    Pseudophakia of both eyes    PVD (peripheral vascular disease) (HCC)    Rheumatic fever    88 years old    Syncope 05/17/2017  Thyroid disease    Urinary incontinence    Vertigo    had been prescribed antivert   Vitamin D deficiency    Past Surgical History:  Procedure Laterality Date   CARPAL TUNNEL RELEASE Bilateral 2019   CATARACT EXTRACTION Bilateral 1997   COLONOSCOPY  2010   NASAL SEPTUM SURGERY  2007   WRIST FRACTURE SURGERY Right 1996   Family History  Problem Relation Age of Onset   Arthritis Mother    COPD Mother    Prostate cancer Father    Arthritis Father    Hearing loss Father    Heart disease Father    Heart attack Father    Stroke Maternal Grandmother    Liver cancer Sister    Arthritis Brother    Prostate cancer Brother    COPD Brother    Heart disease Brother    Breast cancer Daughter    Blindness Maternal Grandfather    Stroke Maternal Grandfather    COPD Paternal Grandmother    Breast cancer Paternal Grandmother    Allergies as of 07/16/2021       Reactions   Augmentin [amoxicillin-pot  Clavulanate] Swelling, Rash   Swelling in face   Lactose Intolerance (gi)         Medication List        Accurate as of Jul 16, 2021 11:02 AM. If you have any questions, ask your nurse or doctor.          albuterol 108 (90 Base) MCG/ACT inhaler Commonly known as: VENTOLIN HFA Inhale 2 puffs into the lungs every 6 (six) hours as needed for wheezing or shortness of breath.   CALCIUM PO Take 1 tablet by mouth 2 (two) times daily. Slow release   ciprofloxacin 500 MG tablet Commonly known as: Cipro Take 1 tablet (500 mg total) by mouth 2 (two) times daily for 3 days. Started by: Howard Pouch, DO   cycloSPORINE 0.05 % ophthalmic emulsion Commonly known as: RESTASIS 1 drop 2 (two) times daily.   folic acid 0.5 MG tablet Commonly known as: FOLVITE Take 0.5 mg by mouth daily.   lactase 3000 units tablet Commonly known as: LACTAID Take 2 tablets (6,000 Units total) by mouth 3 (three) times daily as needed (lactose intolerance).   levothyroxine 50 MCG tablet Commonly known as: SYNTHROID Take 1 tablet (50 mcg total) by mouth daily before breakfast.   montelukast 10 MG tablet Commonly known as: SINGULAIR Take 1 tablet (10 mg total) by mouth at bedtime.   polyvinyl alcohol 1.4 % ophthalmic solution Commonly known as: LIQUIFILM TEARS Place 1 drop into both eyes as needed for dry eyes.   predniSONE 5 MG tablet Commonly known as: DELTASONE Take 5 mg by mouth daily as needed (joint/muscle pain).   pregabalin 25 MG capsule Commonly known as: Lyrica Take 1 capsule (25 mg total) by mouth at bedtime.   PreserVision AREDS 2 Caps Take 1 capsule by mouth 2 (two) times daily.        All past medical history, surgical history, allergies, family history, immunizations andmedications were updated in the EMR today and reviewed under the history and medication portions of their EMR.     Review of Systems  Constitutional:  Positive for chills and malaise/fatigue. Negative for  fever.  Gastrointestinal:  Positive for abdominal pain. Negative for nausea and vomiting.  Genitourinary:  Positive for [name] and frequency. Negative for flank pain and hematuria.  Skin:  Negative for rash.  Negative, with the exception of  above mentioned in HPI   Objective:  BP 137/76   Pulse 72   Temp (!) 97.5 F (36.4 C) (Oral)   Ht 5' (1.524 m)   SpO2 100%   BMI 25.78 kg/m  Body mass index is 25.78 kg/m. Physical Exam Vitals and nursing note reviewed.  Constitutional:      General: Margaret [name] is not in acute distress.    Appearance: Normal appearance. Margaret [name] is normal weight. Margaret [name] is not ill-appearing or toxic-appearing.  Eyes:     Extraocular Movements: Extraocular movements intact.     Conjunctiva/sclera: Conjunctivae normal.     Pupils: Pupils are equal, round, and reactive to light.  Cardiovascular:     Rate and Rhythm: Normal rate and regular rhythm.  Abdominal:     General: There is no distension.     Tenderness: There is abdominal tenderness (mild discomfort suprapubic). There is no right CVA tenderness or left CVA tenderness.  Musculoskeletal:     Right lower leg: No edema.     Left lower leg: No edema.  Neurological:     Mental Status: Margaret [name] is alert and oriented to person, place, and time. Mental status is at baseline.  Psychiatric:        Mood and Affect: Mood normal.        Behavior: Behavior normal.        Thought Content: Thought content normal.        Judgment: Judgment normal.    No results found. No results found. Results for orders placed or performed in visit on 07/16/21 (from the past 24 hour(s))  POCT Urinalysis Dipstick     Status: Abnormal   Collection Time: 07/16/21 10:48 AM  Result Value Ref Range   Color, UA yellow    Clarity, UA clear    Glucose, UA Negative Negative   Bilirubin, UA neg    Ketones, UA neg    Spec Grav, UA <=1.005 (A) 1.010 - 1.025   Blood, UA neg    pH, UA 6.0 5.0 - 8.0   Protein, UA Negative Negative   Urobilinogen, UA  0.2 0.2 or 1.0 E.U./dL   Nitrite, UA neg    Leukocytes, UA Negative Negative   Appearance     Odor      Assessment/Plan: Paidyn Mcferran is a 88 y.o. female present for OV for  Subjective change in urination/[name]/Frequent UTI - POCT Urinalysis Dipstick - Urinalysis w microscopic + reflex cultur - IF UTI was the cause of her symptoms, Margaret [name] is responding well by the look of POCT urine to the cipro Margaret [name] self started. Prescribed extended course cipro 500 bid (additional 3 days- to her already taken 2 days) while we wait on cx results.  F/u PRN   Reviewed expectations re: course of current medical issues. Discussed self-management of symptoms. Outlined signs and symptoms indicating need for more acute intervention. Patient verbalized understanding and all questions were answered. Patient received an After-Visit Summary.  31 minutes spent providing patient counseling and care.    Orders Placed This Encounter  Procedures   Urinalysis w microscopic + reflex cultur   POCT Urinalysis Dipstick   Meds ordered this encounter  Medications   ciprofloxacin (CIPRO) 500 MG tablet    Sig: Take 1 tablet (500 mg total) by mouth 2 (two) times daily for 3 days.    Dispense:  6 tablet    Refill:  0   Referral Orders  No referral(s) requested today     Note is dictated utilizing  voice recognition software. Although note has been proof read prior to signing, occasional typographical errors still can be missed. If any questions arise, please do not hesitate to call for verification.   electronically signed by:  Howard Pouch, DO  [place]

## 2021-07-17 LAB — URINALYSIS W MICROSCOPIC + REFLEX CULTURE
Bacteria, UA: NONE SEEN /HPF
Bilirubin Urine: NEGATIVE
Glucose, UA: NEGATIVE
Hgb urine dipstick: NEGATIVE
Hyaline Cast: NONE SEEN /LPF
Ketones, ur: NEGATIVE
Leukocyte Esterase: NEGATIVE
Nitrites, Initial: NEGATIVE
Protein, ur: NEGATIVE
RBC / HPF: NONE SEEN /HPF (ref 0–2)
Specific Gravity, Urine: 1.007 (ref 1.001–1.035)
Squamous Epithelial / HPF: NONE SEEN /HPF (ref <=5)
WBC, UA: NONE SEEN /HPF (ref 0–5)
pH: 6.5 (ref 5.0–8.0)

## 2021-07-17 LAB — NO CULTURE INDICATED

## 2021-07-29 NOTE — Progress Notes (Signed)
[facility] Note  07/31/2021     CHIEF COMPLAINT Patient presents for Retina Follow Up   HISTORY OF PRESENT ILLNESS: Margaret [name] is a 88 y.o. female who presents to the clinic today for:   HPI     Retina Follow Up   Patient presents with  Wet AMD.  In right eye.  This started 4.5 weeks ago.  I, the attending physician,  performed the HPI with the patient and updated documentation appropriately.        Comments   Patient here for 4.5 weeks retina follow up for exu ARMD OD. Patient states vision OD is a little less than it was. Depends of the time of day. No eye pain. Using allergy drops and allergy tablets.       Last edited by Bernarda Caffey, MD on 07/31/2021  3:32 PM.    Pt states vision "certainly has not improved"  Referring physician: Howard Pouch A, DO [address] [place],   [id number]  HISTORICAL INFORMATION:  Selected notes from the MEDICAL RECORD NUMBER Self referral for macular degeneration LEE: 03.21.19 (Dr. Park Liter in [place], [place]) [BCVA: OD: 20/40 OS: 20/40] Ocular Hx-POAG, non-exu ARMD, DES, pseudo, YAG PMH-astham, arthritis,    CURRENT MEDICATIONS: Current Outpatient Medications (Ophthalmic Drugs)  Medication Sig   cycloSPORINE (RESTASIS) 0.05 % ophthalmic emulsion 1 drop 2 (two) times daily.   polyvinyl alcohol (LIQUIFILM TEARS) 1.4 % ophthalmic solution Place 1 drop into both eyes as needed for dry eyes.   No current facility-administered medications for this visit. (Ophthalmic Drugs)   Current Outpatient Medications (Other)  Medication Sig   albuterol (VENTOLIN HFA) 108 (90 Base) MCG/ACT inhaler Inhale 2 puffs into the lungs every 6 (six) hours as needed for wheezing or shortness of breath.   CALCIUM PO Take 1 tablet by mouth 2 (two) times daily. Slow release   folic acid (FOLVITE) 0.5 MG tablet Take 0.5 mg by mouth daily.   lactase (LACTAID) 3000 units tablet Take 2 tablets (6,000 Units total) by mouth 3 (three) times  daily as needed (lactose intolerance).   levothyroxine (SYNTHROID) 50 MCG tablet Take 1 tablet (50 mcg total) by mouth daily before breakfast.   montelukast (SINGULAIR) 10 MG tablet Take 1 tablet (10 mg total) by mouth at bedtime.   Multiple Vitamins-Minerals (PRESERVISION AREDS 2) CAPS Take 1 capsule by mouth 2 (two) times daily.   predniSONE (DELTASONE) 5 MG tablet Take 5 mg by mouth daily as needed (joint/muscle pain).   pregabalin (LYRICA) 25 MG capsule Take 1 capsule (25 mg total) by mouth at bedtime.   No current facility-administered medications for this visit. (Other)   REVIEW OF SYSTEMS: ROS   Positive for: Musculoskeletal, Endocrine, Eyes Negative for: Constitutional, Gastrointestinal, Neurological, Skin, Genitourinary, HENT, Cardiovascular, Respiratory, Psychiatric, Allergic/Imm, Heme/Lymph Last edited by Theodore Demark, COA on 07/31/2021  2:05 PM.     ALLERGIES Allergies  Allergen Reactions   Augmentin [Amoxicillin-Pot Clavulanate] Swelling and Rash    Swelling in face   Lactose Intolerance (Gi)    PAST MEDICAL HISTORY Past Medical History:  Diagnosis Date   Allergy    Arthritis of left knee    Asthma    Carotid artery stenosis 10/17/2012   stable report in 2018- "moderate on right, minimal on left"   Carpal tunnel syndrome 2018   left.   Cervical radiculopathy 05/16/2015   DDD C4-C5, C5-C6, and C6-C7.  Facet arthropathy throughout cervical spine.  Normal foraminal  narrowing secondary to uncovertebral  arthropathy is seen bilaterally at C3-C4 and C5/C6   Chicken pox    DDD (degenerative disc disease), lumbar 04/26/2015   Mild levoscoliosis, right anterior listhesis of L4 and L5.  DDD at every level.  Facet arthropathy at multiple levels.   Deviated septum    Glaucoma    both eyes, mild opened angle   Heart murmur    History of colon polyps    History of fall    History of frequent urinary tract infections    klebs- pansensitive. c. freundii - resitant to  cefazolin and augementin   Hypothyroidism    Intra-abdominal abscess ([place]) 03/10/2020   Iron deficiency anemia    prior pcp told her to take Fe 325 QD   Macular degeneration    h/o retinal edema   Mitral valve prolapse 08/27/2011   Murmur, cardiac 10/17/2012   Osteoarthritis    Osteoporosis    Pneumonia    Positive TB test    In college    Pseudophakia of both eyes    PVD (peripheral vascular disease) ([place])    Rheumatic fever    88 years old    Syncope 05/17/2017   Thyroid disease    Urinary incontinence    Vertigo    had been prescribed antivert   Vitamin D deficiency    Past Surgical History:  Procedure Laterality Date   CARPAL TUNNEL RELEASE Bilateral 2019   CATARACT EXTRACTION Bilateral 1997   COLONOSCOPY  2010   NASAL SEPTUM SURGERY  2007   WRIST FRACTURE SURGERY Right 1996   FAMILY HISTORY Family History  Problem Relation Age of Onset   Arthritis Mother    COPD Mother    Prostate cancer Father    Arthritis Father    Hearing loss Father    Heart disease Father    Heart attack Father    Stroke Maternal Grandmother    Liver cancer Sister    Arthritis Brother    Prostate cancer Brother    COPD Brother    Heart disease Brother    Breast cancer Daughter    Blindness Maternal Grandfather    Stroke Maternal Grandfather    COPD Paternal Grandmother    Breast cancer Paternal Grandmother    SOCIAL HISTORY Social History   Tobacco Use   Smoking status: Former    Packs/day: 0.50    Types: Cigarettes    Start date: 1952    Quit date: 1965    Years since quitting: 58.4   Smokeless tobacco: Never  Vaping Use   Vaping Use: Never used  Substance Use Topics   Alcohol use: Not Currently    Alcohol/week: 5.0 standard drinks of alcohol    Types: 5 Standard drinks or equivalent per week   Drug use: Never       OPHTHALMIC EXAM:  Base Eye Exam     Visual Acuity (Snellen - Linear)       Right Left   Dist cc 20/50 -2 20/30   Dist ph cc NI NI          Tonometry (Tonopen, 2:03 PM)       Right Left   Pressure 15 13         Pupils       Dark Light Shape React APD   Right 3 2 Round Brisk None   Left 3 2 Round Brisk None         Visual Fields (Counting fingers)  Left Right    Full Full         Extraocular Movement       Right Left    Full, Ortho Full, Ortho         Neuro/Psych     Oriented x3: Yes   Mood/Affect: Normal         Dilation     Both eyes: 1.0% Mydriacyl, 2.5% Phenylephrine @ 2:03 PM           Slit Lamp and Fundus Exam     Slit Lamp Exam       Right Left   Lids/Lashes Dermatochalasis - upper lid, Telangiectasia Dermatochalasis - upper lid, mild Meibomian gland dysfunction   Conjunctiva/Sclera Temporal Pinguecula White and quiet   Cornea Arcus, 1+ Punctate epithelial erosions, mild tear film debris, fine endo pigment Arcus, 1-2+ inferior Punctate epithelial erosions, mild tear film debris, fine endo pigment   Anterior Chamber Deep and quiet, narrow temporal angle Deep and quiet   Iris Round and dilated Round and dilated, pigmented mass 0730 behind iris, anterior to IOL   Lens PC IOL in good position with open PC PC IOL in good position with open PC   Anterior Vitreous Vitreous syneresis Vitreous syneresis, Posterior vitreous detachment         Fundus Exam       Right Left   Disc 360 Peripapillary atrophy, Sharp rim, mild Pallor sharp rim, temporal PPA, mild Pallor   C/D Ratio 0.6 0.3   Macula Flat, Blunted foveal reflex, drusen, RPE mottling and clumping, +central IRF / cystic changes, punctate IRH -- resolved, no SRH, Early RPE atrophy Flat, Blunted foveal reflex, drusen, RPE mottling and clumping, no heme or edema   Vessels attenuated, Tortuous attenuated, Tortuous   Periphery Attached, scattered Reticular degeneration, pigmented cystoid degeneration temporally, No heme Attached, scattered Reticular degeneration, No heme           Refraction     Wearing Rx       Sphere  Cylinder Axis Add   Right -0.75 +2.25 175 +2.75   Left -0.25 +1.25 165 +2.75           IMAGING AND PROCEDURES  Imaging and Procedures for '@TODAY'$ @  OCT, Retina - OU - Both Eyes       Right Eye Quality was good. Central Foveal Thickness: 322. Progression has been stable. Findings include no SRF, abnormal foveal contour, retinal drusen , intraretinal hyper-reflective material, epiretinal membrane, intraretinal fluid, outer retinal atrophy (persistent central IRF / cystic changes -- ?slightly improved, Partial PVD).   Left Eye Quality was good. Central Foveal Thickness: 305. Progression has been stable. Findings include normal foveal contour, no IRF, no SRF, retinal drusen , outer retinal atrophy (Trace ERM, Partial PVD).   Notes *Images captured and stored on drive  Diagnosis / Impression:  OD: Exudative ARMD - persistent central IRF / cystic changes -- ?slightly improved, Partial PVD OS: Nonexudative ARMD OS Partial PVD OU  Clinical management:  See below  Abbreviations: NFP - Normal foveal profile. CME - cystoid macular edema. PED - pigment epithelial detachment. IRF - intraretinal fluid. SRF - subretinal fluid. EZ - ellipsoid zone. ERM - epiretinal membrane. ORA - outer retinal atrophy. ORT - outer retinal tubulation. SRHM - subretinal hyper-reflective material       Intravitreal Injection, Pharmacologic Agent - OD - Right Eye       Time Out 07/31/2021. 2:35 PM. Confirmed correct patient, procedure, site, and patient consented.  Anesthesia Topical anesthesia was used. Anesthetic medications included Lidocaine 2%, Proparacaine 0.5%.   Procedure Preparation included 5% betadine to ocular surface, eyelid speculum. A supplied needle was used.   Injection: 1.25 mg Bevacizumab 1.'25mg'$ /0.27m   Route: Intravitreal, Site: Right Eye   NDC: [id number] Lot:: [id number] Expiration date: 09/19/2021   Post-op Post injection exam found visual acuity of at least counting fingers.  The patient tolerated the procedure well. There were no complications. The patient received written and verbal post procedure care education. Post injection medications were not given.            ASSESSMENT/PLAN:    ICD-10-CM   1. Exudative age-related macular degeneration of right eye with active choroidal neovascularization (HCC)  H35.3211 OCT, Retina - OU - Both Eyes    Intravitreal Injection, Pharmacologic Agent - OD - Right Eye    Bevacizumab (AVASTIN) SOLN 1.25 mg    2. 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 Long-term use of Plaquenil  Z79.899     5. Pseudophakia of both eyes  Z96.1     6. Primary open angle glaucoma (POAG) of both eyes, mild stage  H40.1131     7. Dry eyes  H04.123      1. Exudative age related macular degeneration, OD  - conversion from nonexudative ARMD noted on 9.23.22 visit  - s/p IVA OD #1 (09.23.22), #2 (10.21.22), #3 (11.18.22), #4 (12.30.22), #5 (02.13.23), #6 (03.24.23), #7 (05.08.23) - BCVA stable at 20/50  - OCT shows OD: persistent IRF / cystic changes at 4 weeks - discussed possible IVA resistance and possible switch in medication  - recommend IVA OD #8 today, 06.09.23, but will check EHetty Blend- pt wishes to be treated with IVA - RBA of procedure discussed, questions answered - informed consent obtained and signed 09.23.22 - see procedure note - f/u 6 weeks -- DFE/OCT, possible injection   2. Age related macular degeneration, non-exudative OS  - intermediate stage-no change from previous visit  - The incidence, anatomy, and pathology of dry AMD, risk of progression, and the AREDS and AREDS 2 study including smoking risks discussed with patient.   - recommended [place] Grid monitoring  3. Iris/ciliary body mass OS  - pigmented lesion behind iris but anterior to PCIOL at 0730 and 0300  - 0730 lesion spans 0630 to 0830 behind dilated iris and larger than  0300 lesion  - patient asymptomatic  - pt [facility] Dr[facility]01[facility]22 who dx her with iris margin cyst  4. Plaquenil use for RA  - baseline testing done 05.20.20 -- started plaquenil early May 2020 for RA  - HVF 10-2 showed non-specific defects OU -- essentially normal baseline test  - OCT shows no obvious plaquenil-related changes, but interval development of exudative ARMD OD as above  - currently taking '200mg'$  / day per Dr. SAudelia Hivesinstructions --> 3.29 mg/kg body wt per day  - po prednisone, currently as needed which is about once a week - and is also now on '10mg'$  po MTX weekly + folic acid  - the American Academy of Ophthalmology recommends dosing '5mg'$ /kg per day to reduce risk of retinal toxicity   - recommend consideration of using alternate medication for RA especially given co-morbidity of age-related macular degeneration OU  - [facility] 5. Pseudophakia OU  - s/p CE/IOL  w/ ?canaloplasty OU by Dr. Robyne Askew at [facility]  - doing well  - monitor   6. History of Glaucoma   - not currently on gtts -- IOP 15 OU  - ?underwent canaloplasty in conjunction with CEIOL at [facility] clinic   - referred to and now under the expert care of Dr. Kathlen Mody -- initial visit/consult 10.3.2019  7. Dry Eye Syndrome  - use AT's OU as needed  Ophthalmic Meds Ordered this visit:  Meds ordered this encounter  Medications   Bevacizumab (AVASTIN) SOLN 1.25 mg     Return in about 6 weeks (around 09/11/2021) for f/u exu AMRD OD, DFE, OCT.  There are no Patient Instructions on file for this visit.  Explained the diagnoses, plan, and follow up with the patient and they expressed understanding.  Patient expressed understanding of the importance of proper follow up care.   This document serves as a record of services personally performed by Gardiner Sleeper, MD, PhD. It was created on their behalf by Leonie Douglas, an ophthalmic  technician. The creation of this record is the provider's dictation and/or activities during the visit.    Electronically signed by: Leonie Douglas COA, 07/31/21  3:35 PM  This document serves as a record of services personally performed by Gardiner Sleeper, MD, PhD. It was created on their behalf by San Jetty. Owens Shark, OA an ophthalmic technician. The creation of this record is the provider's dictation and/or activities during the visit.    Electronically signed by: San Jetty. Owens Shark, [place] 06.09.2023 3:35 PM  Gardiner Sleeper, M.D., Ph.D. Diseases & Surgery of the Retina and Vitreous [facility]  I have reviewed the above documentation for accuracy and completeness, and I agree with the above. Gardiner Sleeper, M.D., Ph.D. 07/31/21 3:35 PM   Abbreviations: M myopia (nearsighted); A astigmatism; H hyperopia (farsighted); P presbyopia; Mrx spectacle prescription;  CTL contact lenses; OD right eye; OS left eye; OU both eyes  XT exotropia; ET esotropia; PEK punctate epithelial keratitis; PEE punctate epithelial erosions; DES dry eye syndrome; MGD meibomian gland dysfunction; ATs artificial tears; PFAT's preservative free artificial tears; [place] nuclear sclerotic cataract; PSC posterior subcapsular cataract; ERM epi-retinal membrane; PVD posterior vitreous detachment; RD retinal detachment; DM diabetes mellitus; DR diabetic retinopathy; NPDR non-proliferative diabetic retinopathy; PDR proliferative diabetic retinopathy; CSME clinically significant macular edema; DME diabetic macular edema; dbh dot blot hemorrhages; CWS cotton wool spot; POAG primary open angle glaucoma; C/D cup-to-disc ratio; HVF humphrey visual field; GVF goldmann visual field; OCT optical coherence tomography; IOP intraocular pressure; BRVO Branch retinal vein occlusion; CRVO central retinal vein occlusion; CRAO central retinal artery occlusion; BRAO branch retinal artery occlusion; RT retinal tear; SB scleral buckle; PPV pars  plana vitrectomy; VH Vitreous hemorrhage; PRP panretinal laser photocoagulation; IVK intravitreal kenalog; VMT vitreomacular traction; MH Macular hole;  NVD neovascularization of the disc; NVE neovascularization elsewhere; AREDS age related eye disease study; ARMD age related macular degeneration; POAG primary open angle glaucoma; EBMD epithelial/anterior basement membrane dystrophy; ACIOL anterior chamber intraocular lens; IOL intraocular lens; PCIOL posterior chamber intraocular lens; Phaco/IOL phacoemulsification with intraocular lens placement; [place] photorefractive keratectomy; LASIK laser assisted in situ keratomileusis; HTN hypertension; DM diabetes mellitus; COPD chronic obstructive pulmonary disease

## 2021-07-31 ENCOUNTER — Ambulatory Visit (INDEPENDENT_AMBULATORY_CARE_PROVIDER_SITE_OTHER): Payer: Medicare HMO | Admitting: Ophthalmology

## 2021-07-31 ENCOUNTER — Encounter (INDEPENDENT_AMBULATORY_CARE_PROVIDER_SITE_OTHER): Payer: Self-pay | Admitting: Ophthalmology

## 2021-07-31 DIAGNOSIS — H401131 Primary open-angle glaucoma, bilateral, mild stage: Secondary | ICD-10-CM

## 2021-07-31 DIAGNOSIS — Z961 Presence of intraocular lens: Secondary | ICD-10-CM

## 2021-07-31 DIAGNOSIS — H353211 Exudative age-related macular degeneration, right eye, with active choroidal neovascularization: Secondary | ICD-10-CM

## 2021-07-31 DIAGNOSIS — Z79899 Other long term (current) drug therapy: Secondary | ICD-10-CM

## 2021-07-31 DIAGNOSIS — H353122 Nonexudative age-related macular degeneration, left eye, intermediate dry stage: Secondary | ICD-10-CM | POA: Diagnosis not present

## 2021-07-31 DIAGNOSIS — H04123 Dry eye syndrome of bilateral lacrimal glands: Secondary | ICD-10-CM

## 2021-07-31 DIAGNOSIS — D4989 Neoplasm of unspecified behavior of other specified sites: Secondary | ICD-10-CM

## 2021-07-31 MED ORDER — BEVACIZUMAB CHEMO INJECTION 1.25MG/0.05ML SYRINGE FOR KALEIDOSCOPE
1.2500 mg | INTRAVITREAL | Status: AC | PRN
  Administered 2021-07-31: 1.25 mg via INTRAVITREAL

## 2021-08-20 ENCOUNTER — Emergency Department
Admission: EM | Admit: 2021-08-20 | Discharge: 2021-08-20 | Disposition: A | Discharge status: Home / Self Care | Payer: Medicare HMO | Source: Home / Self Care | Attending: Family Medicine | Admitting: Family Medicine

## 2021-08-20 ENCOUNTER — Encounter: Payer: Self-pay | Admitting: Emergency Medicine

## 2021-08-20 DIAGNOSIS — S41111A Laceration without foreign body of right upper arm, initial encounter: Secondary | ICD-10-CM | POA: Diagnosis not present

## 2021-08-20 DIAGNOSIS — S81811A Laceration without foreign body, right lower leg, initial encounter: Secondary | ICD-10-CM | POA: Diagnosis not present

## 2021-08-20 MED ORDER — LIDOCAINE-EPINEPHRINE-TETRACAINE (LET) TOPICAL GEL
3.0000 mL | Freq: Once | TOPICAL | Status: AC
  Administered 2021-08-20: 3 mL via TOPICAL

## 2021-08-20 NOTE — Discharge Instructions (Signed)
Try to keep the tapes dry Do not use any lotion or ointment Change the dressing daily Call or return for problems The tapes will lift off in about a week.  Be careful with removal

## 2021-08-20 NOTE — ED Triage Notes (Addendum)
Patient states that she fell against a stone wall yesterday and injured her right lower leg and right upper right arm  There's an abrasion on her leg and arm, rinsed the area with cold water covered with bandaids.  Patient unsure of last Tdap.

## 2021-08-20 NOTE — ED Provider Notes (Signed)
[facility]    CSN: [id number] Arrival date & time: 08/20/21  1013      History   Chief Complaint Chief Complaint  Patient presents with   Leg Injury    HPI Margaret [name] is a 88 y.o. female.   HPI  Very pleasant woman here with her daughter.  States that she stubbed her toe while walking on a stone path and she fell.  Injured her right bicep and right shin area.  Has skin tear/laceration in this area.  Unknown last tetanus.  Desires wound management advice.  Patient states she did not have any dizziness chest pain or palpitations that caused her fall, simply lost balance  Past Medical History:  Diagnosis Date   Allergy    Arthritis of left knee    Asthma    Carotid artery stenosis 10/17/2012   stable report in 2018- "moderate on right, minimal on left"   Carpal tunnel syndrome 2018   left.   Cervical radiculopathy 05/16/2015   DDD C4-C5, C5-C6, and C6-C7.  Facet arthropathy throughout cervical spine.  Normal foraminal narrowing secondary to uncovertebral  arthropathy is seen bilaterally at C3-C4 and C5/C6   Chicken pox    DDD (degenerative disc disease), lumbar 04/26/2015   Mild levoscoliosis, right anterior listhesis of L4 and L5.  DDD at every level.  Facet arthropathy at multiple levels.   Deviated septum    Glaucoma    both eyes, mild opened angle   Heart murmur    History of colon polyps    History of fall    History of frequent urinary tract infections    klebs- pansensitive. c. 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) 02/27/2020   Osteoporosis 02/27/2020   Seronegative rheumatoid arthritis ([place]) 08/02/2019   Polymyalgia rheumatica ([place]) 08/02/2019   ANA positive 05/08/2018   Vitamin D deficiency    PVD (peripheral vascular disease) ([place])    Iron deficiency anemia    Seasonal allergies 03/29/2018   Nocturnal leg cramps 03/29/2018   Hypothyroidism 03/29/2018   Overweight (BMI 25.0-29.9) 03/29/2018   Asthma    Cervical radiculopathy 05/16/2015   DDD (degenerative disc disease), lumbar 04/26/2015   Carotid artery stenosis 10/17/2012   Mitral valve prolapse 08/27/2011    Past Surgical History:  Procedure Laterality Date   CARPAL TUNNEL RELEASE Bilateral 2019   CATARACT EXTRACTION Bilateral 1997   COLONOSCOPY  2010   NASAL SEPTUM SURGERY  2007   WRIST FRACTURE SURGERY Right 1996    OB History     Gravida  3   Para  3   Term  Preterm      AB      Living  3      SAB      IAB      Ectopic      Multiple      Live Births               Home Medications    Prior to Admission medications   Medication Sig Start Date End Date Taking? Authorizing Provider  albuterol (VENTOLIN HFA) 108 (90 Base) MCG/ACT inhaler Inhale 2 puffs into the lungs every 6 (six) hours as needed for wheezing or shortness of breath. 06/05/21  Yes [name], [name], DO  CALCIUM PO Take 1 tablet by mouth 2 (two) times daily. Slow release   Yes [provider]  cycloSPORINE (RESTASIS) 0.05 % ophthalmic emulsion 1 drop 2 (two) times daily. 06/05/21  Yes [provider]  folic acid (FOLVITE) 0.5 MG tablet Take 0.5 mg by mouth daily.   Yes [provider]  lactase (LACTAID) 3000 units tablet Take 2 tablets (6,000 Units total) by  mouth 3 (three) times daily as needed (lactose intolerance). 03/15/20  Yes Lavina Hamman, MD  levothyroxine (SYNTHROID) 50 MCG tablet Take 1 tablet (50 mcg total) by mouth daily before breakfast. 06/05/21  Yes [name], [name], DO  montelukast (SINGULAIR) 10 MG tablet Take 1 tablet (10 mg total) by mouth at bedtime. 06/05/21  Yes [name], [name], DO  Multiple Vitamins-Minerals (PRESERVISION AREDS 2) CAPS Take 1 capsule by mouth 2 (two) times daily.   Yes [provider]  polyvinyl alcohol (LIQUIFILM TEARS) 1.4 % ophthalmic solution Place 1 drop into both eyes as needed for dry eyes.   Yes [provider]  pregabalin (LYRICA) 25 MG capsule Take 1 capsule (25 mg total) by mouth at bedtime. 06/05/21  Yes [name], [name], DO  predniSONE (DELTASONE) 5 MG tablet Take 5 mg by mouth daily as needed (joint/muscle pain). 07/10/19   [provider]    Family History Family History  Problem Relation Age of Onset   Arthritis Mother    COPD Mother    Prostate cancer Father    Arthritis Father    Hearing loss Father    Heart disease Father    Heart attack Father    Stroke Maternal Grandmother    Liver cancer Sister    Arthritis Brother    Prostate cancer Brother    COPD Brother    Heart disease Brother    Breast cancer Daughter    Blindness Maternal Grandfather    Stroke Maternal Grandfather    COPD Paternal Grandmother    Breast cancer Paternal Grandmother     Social History Social History   Tobacco Use   Smoking status: Former    Packs/day: 0.50    Types: Cigarettes    Start date: 1952    Quit date: 1965    Years since quitting: 58.5   Smokeless tobacco: Never  Vaping Use   Vaping Use: Never used  Substance Use Topics   Alcohol use: Not Currently    Alcohol/week: 5.0 standard drinks of alcohol    Types: 5 Standard drinks or equivalent per week   Drug use: Never     Allergies   Augmentin [amoxicillin-pot clavulanate] and Lactose intolerance  (gi)   Review of Systems Review of Systems See HPI  Physical Exam Triage Vital Signs ED Triage Vitals  Enc Vitals Group     BP 08/20/21 1029 (!) 143/81  Pulse Rate 08/20/21 1029 60     Resp 08/20/21 1029 18     Temp 08/20/21 1029 98.1 F (36.7 C)     Temp Source 08/20/21 1029 Oral     SpO2 08/20/21 1029 95 %     Weight --      Height --      Head Circumference --      Peak Flow --      Pain Score 08/20/21 1031 0     Pain Loc --      Pain Edu? --      Excl. in [place]? --    No data found.  Updated Vital Signs BP (!) 143/81 (BP Location: Left Arm)   Pulse 60   Temp 98.1 F (36.7 C) (Oral)   Resp 18   SpO2 95%       Physical Exam Constitutional:      General: She is not in acute distress.    Appearance: She is well-developed and normal weight.     Comments: Pleasant elderly woman.  Alert , conversant  HENT:     Head: Normocephalic and atraumatic.  Eyes:     Conjunctiva/sclera: Conjunctivae normal.     Pupils: Pupils are equal, round, and reactive to light.  Cardiovascular:     Rate and Rhythm: Normal rate.  Pulmonary:     Effort: Pulmonary effort is normal. No respiratory distress.  Abdominal:     General: There is no distension.     Palpations: Abdomen is soft.  Musculoskeletal:        General: Normal range of motion.     Cervical back: Normal range of motion.  Skin:    General: Skin is warm and dry.     Comments: There is skin tears present on the right bicep..  It is a complicated tear, total of 6 cm long, but very superficial.  And then closed with Steri-Strips.  There is also skin tear of the anterior lower leg on the right.  The upper part is superficial and folded under.  At the lower part is deeper.  After L ET anesthesia it is cleaned and explored.  Some of the dead skin is debrided.  It is then closed with Steri-Strips.  Wound care discussed  Neurological:     General: No focal deficit present.     Mental Status: She is alert.  Psychiatric:         Mood and Affect: Mood normal.        Behavior: Behavior normal.      UC Treatments / Results  Labs (all labs ordered are listed, but only abnormal results are displayed) Labs Reviewed - No data to display  EKG   Radiology No results found.  Procedures Procedures (including critical care time)  Medications Ordered in UC Medications  lidocaine-EPINEPHrine-tetracaine (LET) topical gel (3 mLs Topical Given 08/20/21 1051)  lidocaine-EPINEPHrine-tetracaine (LET) topical gel (3 mLs Topical Given 08/20/21 1051)    Initial Impression / Assessment and Plan / UC Course  I have reviewed the triage vital signs and the nursing notes.  Pertinent labs & imaging results that were available during my care of the patient were reviewed by me and considered in my medical decision making (see chart for details).    Final Clinical Impressions(s) / UC Diagnoses   Final diagnoses:  Skin tear of right upper arm without complication, initial encounter  Laceration of skin of right lower leg, initial encounter     Discharge Instructions  Try to keep the tapes dry Do not use any lotion or ointment Change the dressing daily Call or return for problems The tapes will lift off in about a week.  Be careful with removal   ED Prescriptions   None    PDMP not reviewed this encounter.   Raylene Everts, MD 08/20/21 [phone]

## 2021-08-24 ENCOUNTER — Telehealth: Payer: Self-pay

## 2021-08-24 NOTE — Telephone Encounter (Signed)
Pt was seen at [facility] on 6/29.  [place] [facility] - Client TELEPHONE ADVICE RECORD [facility] Patient Name: Margaret [name] Gender: Female DOB: November 09, 1929 Age: 88 Y 7 D Return Phone Number: [phone] (Primary), [phone] (Secondary) Address: City/ State/ Zip: [place]  [address] Client [place] [facility] - Client Client Site [place] - Day Provider Raoul Pitch, [place] Contact Type Call Who Is Calling Patient / Member / Family / Caregiver Call Type Triage / Clinical Relationship To Patient Self Return Phone Number [phone] (Primary) Chief Complaint Cuts and Lacerations Reason for Call Symptomatic / Request for [name] and has a gash on her leg and her arm. When she took the bandage off of her leg, it started bleeding again. Translation No Nurse Assessment Nurse: Clovis Riley, RN, Georgina Peer Date/Time (Eastern Time): 08/20/2021 8:58:21 AM Confirm and document reason for call. If symptomatic, describe symptoms. ---Caller states that she fell yesterday and has a gash on her right leg and her right arm. When she took the bandage off of her leg, it started bleeding again. States the gash on the leg is 3-4 inches and arm is minor. Does the patient have any new or worsening symptoms? ---Yes Will a triage be completed? ---Yes Related visit to physician within the last 2 weeks? ---No Does the PT have any chronic conditions? (i.e. diabetes, asthma, this includes High risk factors for pregnancy, etc.) ---Yes List chronic conditions. ---RA Is this a behavioral health or substance abuse call? ---No Guidelines Guideline Title Affirmed Question Affirmed Notes Nurse Date/Time (Eastern Time) Cuts and Lacerations [1] Dirt in the wound AND [2] not removed with 15 minutes of scrubbing [place], RN, Georgina Peer 08/20/2021 9:02:24 AM Disp. Time  Eilene Ghazi Time) Disposition Final User 08/20/2021 9:05:28 AM Go to ED Now Yes Clovis Riley, RN, Roslynn Amble NOTE: All timestamps contained within this report are represented as [place] Standard Time. CONFIDENTIALTY NOTICE: This fax transmission is intended only for the addressee. It contains information that is legally privileged, confidential or otherwise protected from use or disclosure. If you are not the intended recipient, you are strictly prohibited from reviewing, disclosing, copying using or disseminating any of this information or taking any action in reliance on or regarding this information. If you have received this fax in error, please notify us immediately by telephone so that we can arrange for its return to [place]. Phone: [phone], Toll-Free: [phone], Fax: [phone] Page: 2 of 2 Call Id: [id number] Final Disposition 08/20/2021 9:05:28 AM Go to ED Now Yes Clovis Riley, RN, Leilani Merl Disagree/Comply Comply Caller Understands Yes PreDisposition Call Doctor Care Advice Given Per Guideline GO TO ED NOW: * You need to be seen in the Emergency Department. * Go to the ED at ___________ [place] now. Drive carefully. ALTERNATE DISPOSITION - URGENT CARE CENTER: * An Urgent [facility] can usually manage this problem, IF one is available in the caller's area. COVER THE WOUND: * Cover the wound with a dressing. * Use a sterile gauze, an adhesive bandage (such as a Band-Aid), or a clean cloth. CARE ADVICE given per Cuts and Lacerations (Adult) guideline. Referrals GO TO FACILITY UNDECIDED

## 2021-09-02 ENCOUNTER — Ambulatory Visit (INDEPENDENT_AMBULATORY_CARE_PROVIDER_SITE_OTHER): Payer: Medicare HMO | Admitting: Family Medicine

## 2021-09-02 ENCOUNTER — Encounter: Payer: Self-pay | Admitting: Family Medicine

## 2021-09-02 VITALS — BP 118/69 | HR 69 | Temp 97.9°F | Ht 60.0 in | Wt 135.0 lb

## 2021-09-02 DIAGNOSIS — S80811A Abrasion, right lower leg, initial encounter: Secondary | ICD-10-CM

## 2021-09-02 DIAGNOSIS — T148XXA Other injury of unspecified body region, initial encounter: Secondary | ICD-10-CM

## 2021-09-02 NOTE — Progress Notes (Signed)
Margaret [name] , 02/12/1930, 88 y.o., female MRN: [id number] Patient Care Team    Relationship Specialty Notifications Start End  Ma Hillock, DO PCP - General Family Medicine  03/08/19   Bernarda Caffey, MD Consulting Physician Ophthalmology  03/29/18   Konrad Felix, MD Consulting Physician Ophthalmology  03/29/18   Devra Dopp, MD Referring Physician Dermatology  08/02/19   Valinda Party, MD  Rheumatology  08/02/19     Chief Complaint  Patient presents with   Abrasion    Pt reports a an abrasion on R shin and arm 2 weeks ago after a fall;      Subjective: Pt presents for an OV with her daughter today, they have concerns over an abrasion on her right lower extremity.  Patient reports approximately 2 weeks ago she was out in her yard and went to step up on a stone wall.  She reports the stone was just about a foot off the ground and she has done this many at times.  However, on this day she misjudged and fell hitting her right shin on the sharp stone.  She sustained a vertical deep abrasion down her shin.  She was seen at an urgent care and had Steri-Strips placed after debridement and cleaning the ankle was completed.  She reports there was dirt and mulch in the wound.  She states the tapes became very loose and she eventually removed them.  She has used an older prescribed antibiotic cream over the area.  She felt it was healing okay until last night it became more red so she used peroxide over the area.  Today she came in with her daughter because her daughter had more concerns than she did but she admits it is still red and not healing as well as she expected by now.     12/09/2020    9:53 AM 10/22/2020    1:13 PM 03/19/2020   11:32 AM 03/29/2018   10:20 AM  Depression screen PHQ 2/9  Decreased Interest 0 0  0  Down, Depressed, Hopeless 0 0 0 0  PHQ - 2 Score 0 0 0 0    Allergies  Allergen Reactions   Augmentin [Amoxicillin-Pot Clavulanate] Swelling and Rash     Swelling in face   Lactose Intolerance (Gi)    Social History   Social History Narrative   Marital status/children/pets: Widowed.  Moved to [place] from [place] area) 2019.  She shares a home with 1 of her daughters.  Has 1 son and 2 daughters   Education/employment: Buyer, retail of arts degree, retired Marine scientist:      -smoke alarm in the home:Yes     - wears seatbelt: Yes     - Feels safe in their relationships: Yes      2 caffeinated beverages a day no alcohol tobacco or drug use   Past Medical History:  Diagnosis Date   Allergy    Arthritis of left knee    Asthma    Carotid artery stenosis 10/17/2012   stable report in 2018- "moderate on right, minimal on left"   Carpal tunnel syndrome 2018   left.   Cervical radiculopathy 05/16/2015   DDD C4-C5, C5-C6, and C6-C7.  Facet arthropathy throughout cervical spine.  Normal foraminal narrowing secondary to uncovertebral  arthropathy is seen bilaterally at C3-C4 and C5/C6   Chicken pox    DDD (degenerative disc disease), lumbar 04/26/2015   Mild levoscoliosis,  right anterior listhesis of L4 and L5.  DDD at every level.  Facet arthropathy at multiple levels.   Deviated septum    Glaucoma    both eyes, mild opened angle   Heart murmur    History of colon polyps    History of fall    History of frequent urinary tract infections    klebs- pansensitive. c. 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    Pseudophakia of both eyes    PVD (peripheral vascular disease) ([place])    Rheumatic fever    88 years old    Syncope 05/17/2017   Thyroid disease    Urinary incontinence    Vertigo    had been  prescribed antivert   Vitamin D deficiency    Past Surgical History:  Procedure Laterality Date   CARPAL TUNNEL RELEASE Bilateral 2019   CATARACT EXTRACTION Bilateral 1997   COLONOSCOPY  2010   NASAL SEPTUM SURGERY  2007   WRIST FRACTURE SURGERY Right 1996   Family History  Problem Relation Age of Onset   Arthritis Mother    COPD Mother    Prostate cancer Father    Arthritis Father    Hearing loss Father    Heart disease Father    Heart attack Father    Stroke Maternal Grandmother    Liver cancer Sister    Arthritis Brother    Prostate cancer Brother    COPD Brother    Heart disease Brother    Breast cancer Daughter    Blindness Maternal Grandfather    Stroke Maternal Grandfather    COPD Paternal Grandmother    Breast cancer Paternal Grandmother    Allergies as of 09/02/2021       Reactions   Augmentin [amoxicillin-pot Clavulanate] Swelling, Rash   Swelling in face   Lactose Intolerance (gi)         Medication List        Accurate as of September 02, 2021  6:04 PM. If you have any questions, ask your nurse or doctor.          albuterol 108 (90 Base) MCG/ACT inhaler Commonly known as: VENTOLIN HFA Inhale 2 puffs into the lungs every 6 (six) hours as needed for wheezing or shortness of breath.   CALCIUM PO Take 1 tablet by mouth 2 (two) times daily. Slow release   cycloSPORINE 0.05 % ophthalmic emulsion Commonly known as: RESTASIS 1 drop 2 (two) times daily.   folic acid 0.5 MG tablet Commonly known as: FOLVITE Take 0.5 mg by mouth daily.   lactase 3000 units tablet Commonly known as: LACTAID Take 2 tablets (6,000 Units total) by mouth 3 (three) times daily as needed (lactose intolerance).   levothyroxine 50 MCG tablet Commonly known as: SYNTHROID Take 1 tablet (50 mcg total) by mouth daily before breakfast.   montelukast 10 MG tablet Commonly known as: SINGULAIR Take 1 tablet (10 mg total) by mouth at bedtime.   polyvinyl alcohol 1.4 %  ophthalmic solution Commonly known as: LIQUIFILM TEARS Place 1 drop into both eyes as needed for dry  eyes.   predniSONE 5 MG tablet Commonly known as: DELTASONE Take 5 mg by mouth daily as needed (joint/muscle pain).   pregabalin 25 MG capsule Commonly known as: Lyrica Take 1 capsule (25 mg total) by mouth at bedtime.   PreserVision AREDS 2 Caps Take 1 capsule by mouth 2 (two) times daily.        All past medical history, surgical history, allergies, family history, immunizations andmedications were updated in the EMR today and reviewed under the history and medication portions of their EMR.     ROS Negative, with the exception of above mentioned in HPI   Objective:  BP 118/69   Pulse 69   Temp 97.9 F (36.6 C) (Oral)   Ht 5' (1.524 m)   Wt 135 lb (61.2 kg)   SpO2 97%   BMI 26.37 kg/m  Body mass index is 26.37 kg/m. Physical Exam Vitals and nursing note reviewed.  Constitutional:      General: She is not in acute distress.    Appearance: Normal appearance. She is normal weight. She is not ill-appearing or toxic-appearing.  Eyes:     Extraocular Movements: Extraocular movements intact.     Conjunctiva/sclera: Conjunctivae normal.     Pupils: Pupils are equal, round, and reactive to light.  Skin:    Comments: RLE: Mild erythema surrounding approximately 4 inch deep vertical linear abrasion of anterior shin.  No drainage.  No bleeding.  No swelling.  Neurological:     Mental Status: She is alert and oriented to person, place, and time. Mental status is at baseline.  Psychiatric:        Mood and Affect: Mood normal.        Behavior: Behavior normal.        Thought Content: Thought content normal.        Judgment: Judgment normal.      No results found. No results found. No results found for this or any previous visit (from the past 24 hour(s)).  Assessment/Plan: Kaysie Michelini is a 88 y.o. female present for OV for  Abrasion Patient has healing deep  abrasion of her right shin.  We discussed delayed healing secondary to age and location.  The area does receive tension with any movement of ankle/walking. Discussed gentle cleansing daily with warm Epson salt soaks and/or gentle soap. Recommended she apply BB ointment to nonadherent dressing and wrap lightly with Coban dressing.  She was provided with a Telfa and Coban today so she would make sure she picks up the correct products.  She was instructed to make sure Coban is not too tight to cut off circulation to distal extremity. She and her daughter will monitor for any signs of infection which were discussed with them today.  If they do appreciate any infection she will follow-up immediately, otherwise she will continue regimen above until complete healing is accomplished.  Reviewed expectations re: course of current medical issues. Discussed self-management of symptoms. Outlined signs and symptoms indicating need for more acute intervention. Patient verbalized understanding and all questions were answered. Patient received an After-Visit Summary.    No orders of the defined types were placed in this encounter.  No orders of the defined types were placed in this encounter.  Referral Orders  No referral(s) requested today     Note is dictated utilizing voice recognition software. Although note has been proof read prior to signing, occasional typographical errors still can be missed. If any questions arise, please do not hesitate to  call for verification.   electronically signed by:  Howard Pouch, DO  [place]

## 2021-09-03 NOTE — Telephone Encounter (Signed)
SOB in progress

## 2021-09-03 NOTE — Telephone Encounter (Signed)
Please advise if we can take over prescribing the prolia inj

## 2021-09-03 NOTE — Telephone Encounter (Signed)
After further investigation reviewing patient's chart-it appears she has been receiving Prolia injections from her rheumatologist under the diagnostic code of osteoporosis.  If she is receiving her injections for osteoporosis, and we are able to get her approved for those injections here, under this provider's name, then she may receive them here.  Please initiate approval process if this is what she desires.  Once approved we would be able to provide her with her next injection that would be due after the approval is completed and we have a Prolia in stock for her.  If any of the above cannot be arranged, then she will need to continue having them completed at her rheumatologist.

## 2021-09-04 NOTE — Telephone Encounter (Signed)
PA completed via phone. Approved for 1 year waiting on faxed confirmation.

## 2021-09-10 NOTE — Progress Notes (Signed)
[facility] Note  09/14/2021    CHIEF COMPLAINT Patient presents for Retina Follow Up   HISTORY OF PRESENT ILLNESS: Margaret [name] is a 88 y.o. female who presents to the clinic today for:   HPI     Retina Follow Up   Patient presents with  Wet AMD.  In right eye.  This started 6 weeks ago.  I, the attending physician,  performed the HPI with the patient and updated documentation appropriately.        Comments   Patient here for 6 weeks retina follow up for exu ARMD OD. Patient states vision varies so much. Sometimes can hardly read a sentence. Other times can read a whole story. No eye pain.       Last edited by Bernarda Caffey, MD on 09/14/2021  1:13 PM.    Patient states "my vision varies depending on so many factors"  Referring physician: Howard Pouch A, DO [address] [place],  [place] [address]  HISTORICAL INFORMATION:  Selected notes from the MEDICAL RECORD NUMBER Self referral for macular degeneration LEE: 03.21.19 (Dr. Park Liter in [place], [place]) [BCVA: OD: 20/40 OS: 20/40] Ocular Hx-POAG, non-exu ARMD, DES, pseudo, YAG PMH-astham, arthritis,    CURRENT MEDICATIONS: Current Outpatient Medications (Ophthalmic Drugs)  Medication Sig   cycloSPORINE (RESTASIS) 0.05 % ophthalmic emulsion 1 drop 2 (two) times daily.   polyvinyl alcohol (LIQUIFILM TEARS) 1.4 % ophthalmic solution Place 1 drop into both eyes as needed for dry eyes.   No current facility-administered medications for this visit. (Ophthalmic Drugs)   Current Outpatient Medications (Other)  Medication Sig   albuterol (VENTOLIN HFA) 108 (90 Base) MCG/ACT inhaler Inhale 2 puffs into the lungs every 6 (six) hours as needed for wheezing or shortness of breath.   CALCIUM PO Take 1 tablet by mouth 2 (two) times daily. Slow release   folic acid (FOLVITE) 0.5 MG tablet Take 0.5 mg by mouth daily.   lactase (LACTAID) 3000 units tablet Take 2 tablets (6,000 Units total) by mouth 3 (three)  times daily as needed (lactose intolerance).   levothyroxine (SYNTHROID) 50 MCG tablet Take 1 tablet (50 mcg total) by mouth daily before breakfast.   montelukast (SINGULAIR) 10 MG tablet Take 1 tablet (10 mg total) by mouth at bedtime.   Multiple Vitamins-Minerals (PRESERVISION AREDS 2) CAPS Take 1 capsule by mouth 2 (two) times daily.   predniSONE (DELTASONE) 5 MG tablet Take 5 mg by mouth daily as needed (joint/muscle pain).   pregabalin (LYRICA) 25 MG capsule Take 1 capsule (25 mg total) by mouth at bedtime.   No current facility-administered medications for this visit. (Other)   REVIEW OF SYSTEMS: ROS   Positive for: Musculoskeletal, Endocrine, Eyes Negative for: Constitutional, Gastrointestinal, Neurological, Skin, Genitourinary, HENT, Cardiovascular, Respiratory, Psychiatric, Allergic/Imm, Heme/Lymph Last edited by Theodore Demark, COA on 09/14/2021 12:49 PM.     ALLERGIES Allergies  Allergen Reactions   Augmentin [Amoxicillin-Pot Clavulanate] Swelling and Rash    Swelling in face   Lactose Intolerance (Gi)    PAST MEDICAL HISTORY Past Medical History:  Diagnosis Date   Allergy    Arthritis of left knee    Asthma    Carotid artery stenosis 10/17/2012   stable report in 2018- "moderate on right, minimal on left"   Carpal tunnel syndrome 2018   left.   Cervical radiculopathy 05/16/2015   DDD C4-C5, C5-C6, and C6-C7.  Facet arthropathy throughout cervical spine.  Normal foraminal narrowing secondary to  uncovertebral  arthropathy is seen bilaterally at C3-C4 and C5/C6   Chicken pox    DDD (degenerative disc disease), lumbar 04/26/2015   Mild levoscoliosis, right anterior listhesis of L4 and L5.  DDD at every level.  Facet arthropathy at multiple levels.   Deviated septum    Glaucoma    both eyes, mild opened angle   Heart murmur    History of colon polyps    History of fall    History of frequent urinary tract infections    klebs- pansensitive. c. freundii - resitant  to cefazolin and augementin   Hypothyroidism    Intra-abdominal abscess ([place]) 03/10/2020   Iron deficiency anemia    prior pcp told her to take Fe 325 QD   Macular degeneration    h/o retinal edema   Mitral valve prolapse 08/27/2011   Murmur, cardiac 10/17/2012   Osteoarthritis    Osteoporosis    Pneumonia    Positive TB test    In college    Pseudophakia of both eyes    PVD (peripheral vascular disease) ([place])    Rheumatic fever    88 years old    Syncope 05/17/2017   Thyroid disease    Urinary incontinence    Vertigo    had [name] prescribed antivert   Vitamin D deficiency    Past Surgical History:  Procedure Laterality Date   CARPAL TUNNEL RELEASE Bilateral 2019   CATARACT EXTRACTION Bilateral 1997   COLONOSCOPY  2010   NASAL SEPTUM SURGERY  2007   WRIST FRACTURE SURGERY Right 1996   FAMILY HISTORY Family History  Problem Relation Age of Onset   Arthritis Mother    COPD Mother    Prostate cancer Father    Arthritis Father    Hearing loss Father    Heart disease Father    Heart attack Father    Stroke Maternal Grandmother    Liver cancer Sister    Arthritis Brother    Prostate cancer Brother    COPD Brother    Heart disease Brother    Breast cancer Daughter    Blindness Maternal Grandfather    Stroke Maternal Grandfather    COPD Paternal Grandmother    Breast cancer Paternal Grandmother    SOCIAL HISTORY Social History   Tobacco Use   Smoking status: Former    Packs/day: 0.50    Types: Cigarettes    Start date: 1952    Quit date: 1965    Years since quitting: 58.5   Smokeless tobacco: Never  Vaping Use   Vaping Use: Never used  Substance Use Topics   Alcohol use: Not Currently    Alcohol/week: 5.0 standard drinks of alcohol    Types: 5 Standard drinks or equivalent per week   Drug use: Never       OPHTHALMIC EXAM:  Base Eye Exam     Visual Acuity (Snellen - Linear)       Right Left   Dist cc 20/50 20/25 -1   Dist ph cc NI NI     Correction: Glasses         Tonometry (Tonopen, 12:47 PM)       Right Left   Pressure 16 15         Pupils       Dark Light Shape React APD   Right 3 2 Round Brisk None   Left 3 2 Round Brisk None         Visual Fields (Counting fingers)  Left Right    Full Full         Extraocular Movement       Right Left    Full, Ortho Full, Ortho         Neuro/Psych     Oriented x3: Yes   Mood/Affect: Normal         Dilation     Both eyes: 1.0% Mydriacyl, 2.5% Phenylephrine @ 12:46 PM           Slit Lamp and Fundus Exam     External Exam       Right Left   External Normal Normal         Slit Lamp Exam       Right Left   Lids/Lashes Dermatochalasis - upper lid, Telangiectasia Dermatochalasis - upper lid, mild Meibomian gland dysfunction   Conjunctiva/Sclera Temporal Pinguecula White and quiet   Cornea Arcus, 1+ Punctate epithelial erosions, mild tear film debris, fine endo pigment Arcus, 1-2+ inferior Punctate epithelial erosions, mild tear film debris, fine endo pigment   Anterior Chamber Deep and quiet, narrow temporal angle Deep and quiet   Iris Round and dilated Round and dilated, pigmented mass 0730 behind iris, anterior to IOL   Lens PC IOL in good position with open PC PC IOL in good position with open PC   Anterior Vitreous Vitreous syneresis Vitreous syneresis, Posterior vitreous detachment         Fundus Exam       Right Left   Disc 360 Peripapillary atrophy, Sharp rim, mild Pallor sharp rim, temporal PPA, mild Pallor   C/D Ratio 0.6 0.3   Macula Flat, Blunted foveal reflex, drusen, RPE mottling and clumping, +central IRF / cystic changes, punctate IRH --stably resolved, no SRH, Early RPE atrophy Flat, Blunted foveal reflex, drusen, RPE mottling and clumping, no heme or edema   Vessels attenuated, Tortuous attenuated, Tortuous   Periphery Attached, scattered Reticular degeneration, pigmented cystoid degeneration temporally, No  heme Attached, scattered Reticular degeneration, No heme           Refraction     Wearing Rx       Sphere Cylinder Axis Add   Right -0.75 +2.25 175 +2.75   Left -0.25 +1.25 165 +2.75           IMAGING AND PROCEDURES  Imaging and Procedures for '@TODAY'$ @  OCT, Retina - OU - Both Eyes       Right Eye Quality was good. Central Foveal Thickness: 319. Progression has [name] stable. Findings include no SRF, abnormal foveal contour, retinal drusen , intraretinal hyper-reflective material, epiretinal membrane, intraretinal fluid, outer retinal atrophy (persistent central IRF / cystic changes --Partial PVD).   Left Eye Quality was good. Central Foveal Thickness: 297. Progression has [name] stable. Findings include normal foveal contour, no IRF, no SRF, retinal drusen , outer retinal atrophy (Trace ERM, Partial PVD).   Notes *Images captured and stored on drive  Diagnosis / Impression:  OD: Exudative ARMD - persistent central IRF / cystic changes -- no significant improvement OS: Nonexudative ARMD OS Partial PVD OU  Clinical management:  See below  Abbreviations: NFP - Normal foveal profile. CME - cystoid macular edema. PED - pigment epithelial detachment. IRF - intraretinal fluid. SRF - subretinal fluid. EZ - ellipsoid zone. ERM - epiretinal membrane. ORA - outer retinal atrophy. ORT - outer retinal tubulation. SRHM - subretinal hyper-reflective material       Intravitreal Injection, Pharmacologic Agent - OD - Right Eye  Time Out 09/14/2021. 12:59 PM. Confirmed correct patient, procedure, site, and patient consented.   Anesthesia Anesthetic medications included Lidocaine 2%, Proparacaine 0.5%.   Procedure Preparation included 5% betadine to ocular surface, eyelid speculum.   Injection: 2 mg aflibercept 2 MG/0.05ML   Route: Intravitreal, Site: Right Eye   NDC: [id number], Lot: [id number], Expiration date: 06/22/2022, Waste: 0 mL   Post-op Post injection exam  found visual acuity of at least counting fingers. The patient tolerated the procedure well. There were no complications. The patient received written and verbal post procedure care education.             ASSESSMENT/PLAN:    ICD-10-CM   1. Exudative age-related macular degeneration of right eye with active choroidal neovascularization (HCC)  H35.3211 OCT, Retina - OU - Both Eyes    Intravitreal Injection, Pharmacologic Agent - OD - Right Eye    aflibercept (EYLEA) SOLN 2 mg    2. 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 Long-term use of Plaquenil  Z79.899     5. Pseudophakia of both eyes  Z96.1     6. Primary open angle glaucoma (POAG) of both eyes, mild stage  H40.1131     7. Dry eyes  H04.123      1. Exudative age related macular degeneration, OD  - conversion from nonexudative ARMD noted on 9.23.22 visit  - s/p IVA OD #1 (09.23.22), #2 (10.21.22), #3 (11.18.22), #4 (12.30.22), #5 (02.13.23), #6 (03.24.23), #7 (05.08.23), #8 (09.23.22) - BCVA stable at 20/50  - OCT shows OD: persistent IRF / cystic changes at 6 weeks - discussed possible IVA resistance and possible switch in medication  - recommend IVE OD #1 today, 07.24.23 - pt wishes to be treated with IVE - consent signed right eye 07.24.23 - RBA of procedure discussed, questions answered - Eylea informed consent obtained and signed 07.24.23 - Avastin informed consent obtained and signed 09.23.22 - see procedure note - f/u 4 weeks -- DFE/OCT, possible injection   2. Age related macular degeneration, non-exudative OS  - intermediate stage-no change from previous visit  - The incidence, anatomy, and pathology of dry AMD, risk of progression, and the AREDS and AREDS 2 study including smoking risks discussed with patient.   - recommend [place] grid monitoring  3. Iris/ciliary body mass OS  - pigmented lesion behind iris but anterior to PCIOL  at 0730 and 0300  - 0730 lesion spans 0630 to 0830 behind dilated iris and larger than 0300 lesion  - patient asymptomatic  - pt saw Dr. Dow Adolph, [facility] on 11.01.22 who dx her with iris margin cyst  4. Plaquenil use for RA  - baseline testing done 05.20.20 -- started plaquenil early May 2020 for RA  - HVF 10-2 showed non-specific defects OU -- essentially normal baseline test  - OCT shows no obvious plaquenil-related changes, but interval development of exudative ARMD OD as above  - currently taking '200mg'$  / day per Dr. Audelia Hives instructions --> 3.29 mg/kg body wt per day  - po prednisone, currently as needed which is about once a week - and is also now on '10mg'$  po MTX weekly + folic acid  - the American Academy of Ophthalmology recommends dosing '5mg'$ /kg per day to reduce risk of retinal toxicity   - recommend consideration of using alternate medication for RA especially given co-morbidity of age-related macular degeneration OU  -  managed [facility] Dossie Der [facility]  5. Pseudophakia OU  - s/p CE/IOL w/ ?canaloplasty OU by Dr. Robyne Askew at [facility]  - doing well  - monitor  6. History of Glaucoma   - not currently on gtts -- IOP 15 OU  - ?underwent canaloplasty in conjunction with CEIOL at [facility]   - referred to and now under the expert care of Dr. Kathlen Mody -- initial visit/consult 10.3.2019  7. Dry Eye Syndrome  - use AT's OU as needed  Ophthalmic Meds Ordered this visit:  Meds ordered this encounter  Medications   aflibercept (EYLEA) SOLN 2 mg     Return in about 4 weeks (around 10/12/2021) for ex ARMD OD - DFE, OCT, Possible Injxn.  There are no Patient Instructions on file for this visit.  Explained the diagnoses, plan, and follow up with the patient and they expressed understanding.  Patient expressed understanding of the importance of proper follow up care.   This document serves as a record of services personally performed  by Gardiner Sleeper, MD, PhD. It was created on their behalf by Roselee Nova, COMT. The creation of this record is the provider's dictation and/or activities during the visit.  Electronically signed by: Roselee Nova, COMT 09/14/21 1:15 PM  This document serves as a record of services personally performed by Gardiner Sleeper, MD, PhD. It was created on their behalf by Renaldo Reel, [place] an ophthalmic technician. The creation of this record is the provider's dictation and/or activities during the visit.    Electronically signed by:  Renaldo Reel, COT  09/14/21 1:15 PM   Gardiner Sleeper, M.D., Ph.D. Diseases & Surgery of the Retina and Vitreous [facility]  I have reviewed the above documentation for accuracy and completeness, and I agree with the above. Gardiner Sleeper, M.D., Ph.D. 09/14/21 1:17 PM  Abbreviations: M myopia (nearsighted); A astigmatism; H hyperopia (farsighted); P presbyopia; Mrx spectacle prescription;  CTL contact lenses; OD right eye; OS left eye; OU both eyes  XT exotropia; ET esotropia; PEK punctate epithelial keratitis; PEE punctate epithelial erosions; DES dry eye syndrome; MGD meibomian gland dysfunction; ATs artificial tears; PFAT's preservative free artificial tears; [place] nuclear sclerotic cataract; PSC posterior subcapsular cataract; ERM epi-retinal membrane; PVD posterior vitreous detachment; RD retinal detachment; DM diabetes mellitus; DR diabetic retinopathy; NPDR non-proliferative diabetic retinopathy; PDR proliferative diabetic retinopathy; CSME clinically significant macular edema; DME diabetic macular edema; dbh dot blot hemorrhages; CWS cotton wool spot; POAG primary open angle glaucoma; C/D cup-to-disc ratio; HVF humphrey visual field; GVF goldmann visual field; OCT optical coherence tomography; IOP intraocular pressure; BRVO Branch retinal vein occlusion; CRVO central retinal vein occlusion; CRAO central retinal artery occlusion; BRAO  branch retinal artery occlusion; RT retinal tear; SB scleral buckle; PPV pars plana vitrectomy; VH Vitreous hemorrhage; PRP panretinal laser photocoagulation; IVK intravitreal kenalog; VMT vitreomacular traction; MH Macular hole;  NVD neovascularization of the disc; NVE neovascularization elsewhere; AREDS age related eye disease study; ARMD age related macular degeneration; POAG primary open angle glaucoma; EBMD epithelial/anterior basement membrane dystrophy; ACIOL anterior chamber intraocular lens; IOL intraocular lens; PCIOL posterior chamber intraocular lens; Phaco/IOL phacoemulsification with intraocular lens placement; [place] photorefractive keratectomy; LASIK laser assisted in situ keratomileusis; HTN hypertension; DM diabetes mellitus; COPD chronic obstructive pulmonary disease

## 2021-09-14 ENCOUNTER — Encounter (INDEPENDENT_AMBULATORY_CARE_PROVIDER_SITE_OTHER): Payer: Self-pay | Admitting: Ophthalmology

## 2021-09-14 ENCOUNTER — Ambulatory Visit (INDEPENDENT_AMBULATORY_CARE_PROVIDER_SITE_OTHER): Payer: Medicare HMO | Admitting: Ophthalmology

## 2021-09-14 DIAGNOSIS — Z79899 Other long term (current) drug therapy: Secondary | ICD-10-CM

## 2021-09-14 DIAGNOSIS — H353122 Nonexudative age-related macular degeneration, left eye, intermediate dry stage: Secondary | ICD-10-CM

## 2021-09-14 DIAGNOSIS — H401131 Primary open-angle glaucoma, bilateral, mild stage: Secondary | ICD-10-CM

## 2021-09-14 DIAGNOSIS — H353211 Exudative age-related macular degeneration, right eye, with active choroidal neovascularization: Secondary | ICD-10-CM | POA: Diagnosis not present

## 2021-09-14 DIAGNOSIS — H04123 Dry eye syndrome of bilateral lacrimal glands: Secondary | ICD-10-CM

## 2021-09-14 DIAGNOSIS — Z961 Presence of intraocular lens: Secondary | ICD-10-CM | POA: Diagnosis not present

## 2021-09-14 DIAGNOSIS — D4989 Neoplasm of unspecified behavior of other specified sites: Secondary | ICD-10-CM

## 2021-09-14 MED ORDER — AFLIBERCEPT 2MG/0.05ML IZ SOLN FOR KALEIDOSCOPE
2.0000 mg | INTRAVITREAL | Status: AC | PRN
  Administered 2021-09-14: 2 mg via INTRAVITREAL

## 2021-10-02 ENCOUNTER — Ambulatory Visit (INDEPENDENT_AMBULATORY_CARE_PROVIDER_SITE_OTHER): Payer: Medicare HMO

## 2021-10-02 DIAGNOSIS — M810 Age-related osteoporosis without current pathological fracture: Secondary | ICD-10-CM | POA: Diagnosis not present

## 2021-10-02 MED ORDER — DENOSUMAB 60 MG/ML ~~LOC~~ SOSY
60.0000 mg | PREFILLED_SYRINGE | Freq: Once | SUBCUTANEOUS | Status: AC
  Administered 2021-10-02: 60 mg via SUBCUTANEOUS

## 2021-10-02 NOTE — Progress Notes (Signed)
[facility] Note  10/16/2021    CHIEF COMPLAINT Patient presents for Retina Follow Up   HISTORY OF PRESENT ILLNESS: Margaret [name] is a 88 y.o. female who presents to the clinic today for:   HPI     Retina Follow Up   Patient presents with  Wet AMD (IVE OD 07.24.23).  In right eye.  This started months ago.  Duration of 4 weeks.  Since onset it is stable.  I, the attending physician,  performed the HPI with the patient and updated documentation appropriately.        Comments   Patient states that the vision is the same.       Last edited by Bernarda Caffey, MD on 10/17/2021 12:02 AM.     Patient states  Referring physician: Ma Hillock, DO [address] [place],  [place] [address]  HISTORICAL INFORMATION:  Selected notes from the MEDICAL RECORD NUMBER Self referral for macular degeneration LEE: 03.21.19 (Dr. Park Liter in [place], [place]) [BCVA: OD: 20/40 OS: 20/40] Ocular Hx-POAG, non-exu ARMD, DES, pseudo, YAG PMH-astham, arthritis,    CURRENT MEDICATIONS: Current Outpatient Medications (Ophthalmic Drugs)  Medication Sig   cycloSPORINE (RESTASIS) 0.05 % ophthalmic emulsion 1 drop 2 (two) times daily.   polyvinyl alcohol (LIQUIFILM TEARS) 1.4 % ophthalmic solution Place 1 drop into both eyes as needed for dry eyes.   No current facility-administered medications for this visit. (Ophthalmic Drugs)   Current Outpatient Medications (Other)  Medication Sig   albuterol (VENTOLIN HFA) 108 (90 Base) MCG/ACT inhaler Inhale 2 puffs into the lungs every 6 (six) hours as needed for wheezing or shortness of breath.   CALCIUM PO Take 1 tablet by mouth 2 (two) times daily. Slow release   folic acid (FOLVITE) 0.5 MG tablet Take 0.5 mg by mouth daily.   lactase (LACTAID) 3000 units tablet Take 2 tablets (6,000 Units total) by mouth 3 (three) times daily as needed (lactose intolerance).   levothyroxine (SYNTHROID) 50 MCG tablet Take 1 tablet (50 mcg total) by  mouth daily before breakfast.   montelukast (SINGULAIR) 10 MG tablet Take 1 tablet (10 mg total) by mouth at bedtime.   Multiple Vitamins-Minerals (PRESERVISION AREDS 2) CAPS Take 1 capsule by mouth 2 (two) times daily.   predniSONE (DELTASONE) 5 MG tablet Take 5 mg by mouth daily as needed (joint/muscle pain).   pregabalin (LYRICA) 25 MG capsule Take 1 capsule (25 mg total) by mouth at bedtime.   No current facility-administered medications for this visit. (Other)   REVIEW OF SYSTEMS: ROS   Positive for: Musculoskeletal, Endocrine, Eyes Negative for: Constitutional, Gastrointestinal, Neurological, Skin, Genitourinary, HENT, Cardiovascular, Respiratory, Psychiatric, Allergic/Imm, Heme/Lymph Last edited by Annie Paras, COT on 10/16/2021  1:21 PM.      ALLERGIES Allergies  Allergen Reactions   Augmentin [Amoxicillin-Pot Clavulanate] Swelling and Rash    Swelling in face   Lactose Intolerance (Gi)    PAST MEDICAL HISTORY Past Medical History:  Diagnosis Date   Allergy    Arthritis of left knee    Asthma    Carotid artery stenosis 10/17/2012   stable report in 2018- "moderate on right, minimal on left"   Carpal tunnel syndrome 2018   left.   Cervical radiculopathy 05/16/2015   DDD C4-C5, C5-C6, and C6-C7.  Facet arthropathy throughout cervical spine.  Normal foraminal narrowing secondary to uncovertebral  arthropathy is seen bilaterally at C3-C4 and C5/C6   Chicken pox    DDD (degenerative  disc disease), lumbar 04/26/2015   Mild levoscoliosis, right anterior listhesis of L4 and L5.  DDD at every level.  Facet arthropathy at multiple levels.   Deviated septum    Glaucoma    both eyes, mild opened angle   Heart murmur    History of colon polyps    History of fall    History of frequent urinary tract infections    klebs- pansensitive. c. freundii - resitant to cefazolin and augementin   Hypothyroidism    Intra-abdominal abscess ([place]) 03/10/2020   Iron deficiency  anemia    prior pcp told her to take Fe 325 QD   Macular degeneration    h/o retinal edema   Mitral valve prolapse 08/27/2011   Murmur, cardiac 10/17/2012   Osteoarthritis    Osteoporosis    Pneumonia    Positive TB test    In college    Pseudophakia of both eyes    PVD (peripheral vascular disease) ([place])    Rheumatic fever    88 years old    Syncope 05/17/2017   Thyroid disease    Urinary incontinence    Vertigo    had been prescribed antivert   Vitamin D deficiency    Past Surgical History:  Procedure Laterality Date   CARPAL TUNNEL RELEASE Bilateral 2019   CATARACT EXTRACTION Bilateral 1997   COLONOSCOPY  2010   NASAL SEPTUM SURGERY  2007   WRIST FRACTURE SURGERY Right 1996   FAMILY HISTORY Family History  Problem Relation Age of Onset   Arthritis Mother    COPD Mother    Prostate cancer Father    Arthritis Father    Hearing loss Father    Heart disease Father    Heart attack Father    Stroke Maternal Grandmother    Liver cancer Sister    Arthritis Brother    Prostate cancer Brother    COPD Brother    Heart disease Brother    Breast cancer Daughter    Blindness Maternal Grandfather    Stroke Maternal Grandfather    COPD Paternal Grandmother    Breast cancer Paternal Grandmother    SOCIAL HISTORY Social History   Tobacco Use   Smoking status: Former    Packs/day: 0.50    Types: Cigarettes    Start date: 1952    Quit date: 1965    Years since quitting: 58.6   Smokeless tobacco: Never  Vaping Use   Vaping Use: Never used  Substance Use Topics   Alcohol use: Not Currently    Alcohol/week: 5.0 standard drinks of alcohol    Types: 5 Standard drinks or equivalent per week   Drug use: Never       OPHTHALMIC EXAM:  Base Eye Exam     Visual Acuity (Snellen - Linear)       Right Left   Dist cc 20/50 20/25   Dist ph cc NI NI    Correction: Glasses         Tonometry (Tonopen, 1:25 PM)       Right Left   Pressure 8 10         Pupils        Dark Light Shape React APD   Right 3 2 Round Brisk None   Left 3 2 Round Brisk None         Visual Fields       Left Right    Full Full         Extraocular  Movement       Right Left    Full, Ortho Full, Ortho         Neuro/Psych     Oriented x3: Yes   Mood/Affect: Normal         Dilation     Both eyes: 2.5% Phenylephrine, 1.0% Mydriacyl @ 1:22 PM           Slit Lamp and Fundus Exam     External Exam       Right Left   External Normal Normal         Slit Lamp Exam       Right Left   Lids/Lashes Dermatochalasis - upper lid, Telangiectasia Dermatochalasis - upper lid, mild Meibomian gland dysfunction   Conjunctiva/Sclera Temporal Pinguecula White and quiet   Cornea Arcus, 1+ Punctate epithelial erosions, mild tear film debris, fine endo pigment Arcus, 1-2+ inferior Punctate epithelial erosions, mild tear film debris, fine endo pigment   Anterior Chamber Deep and quiet, narrow temporal angle Deep and quiet   Iris Round and dilated Round and dilated, pigmented mass at 0200 and 0730 behind iris, anterior to IOL   Lens PC IOL in good position with open PC PC IOL in good position with open PC   Anterior Vitreous Vitreous syneresis Vitreous syneresis, Posterior vitreous detachment         Fundus Exam       Right Left   Disc 360 Peripapillary atrophy, Sharp rim, 2+ Pallor sharp rim, temporal PPA, mild Pallor   C/D Ratio 0.6 0.3   Macula Flat, Blunted foveal reflex, drusen, RPE mottling and clumping, +central IRF / cystic changes -- stably resolved, punctate IRH --stably resolved, no SRH, Early RPE atrophy Flat, Blunted foveal reflex, drusen, RPE mottling and clumping, no heme or edema   Vessels attenuated, Tortuous attenuated, Tortuous   Periphery Attached, scattered Reticular degeneration, pigmented cystoid degeneration temporally, No heme Attached, scattered Reticular degeneration, No heme           Refraction     Wearing Rx        Sphere Cylinder Axis Add   Right -0.75 +2.25 175 +2.75   Left -0.25 +1.25 165 +2.75           IMAGING AND PROCEDURES  Imaging and Procedures for '@TODAY'$ @  OCT, Retina - OU - Both Eyes       Right Eye Quality was good. Central Foveal Thickness: 307. Progression has improved. Findings include no SRF, abnormal foveal contour, retinal drusen , intraretinal hyper-reflective material, epiretinal membrane, intraretinal fluid, outer retinal atrophy (persistent central IRF / cystic changes -- slightly improved, Partial PVD).   Left Eye Quality was good. Central Foveal Thickness: 297. Progression has been stable. Findings include normal foveal contour, no IRF, no SRF, retinal drusen , outer retinal atrophy (Trace ERM, Partial PVD).   Notes *Images captured and stored on drive  Diagnosis / Impression:  OD: Exudative ARMD - persistent central IRF / cystic changes -- slightly improved, Partial PVD OS: Nonexudative ARMD OS Partial PVD OU  Clinical management:  See below  Abbreviations: NFP - Normal foveal profile. CME - cystoid macular edema. PED - pigment epithelial detachment. IRF - intraretinal fluid. SRF - subretinal fluid. EZ - ellipsoid zone. ERM - epiretinal membrane. ORA - outer retinal atrophy. ORT - outer retinal tubulation. SRHM - subretinal hyper-reflective material       Intravitreal Injection, Pharmacologic Agent - OD - Right Eye       Time Out 10/16/2021.  2:20 PM. Confirmed correct patient, procedure, site, and patient consented.   Anesthesia Anesthetic medications included Lidocaine 2%, Proparacaine 0.5%.   Procedure Preparation included 5% betadine to ocular surface, eyelid speculum. A (32g) needle was used.   Injection: 2 mg aflibercept 2 MG/0.05ML   Route: Intravitreal, Site: Right Eye   NDC: [id number], Lot: [id number], Expiration date: 04/21/2022, Waste: 0 mL   Post-op Post injection exam found visual acuity of at least counting fingers. The patient  tolerated the procedure well. There were no complications. The patient received written and verbal post procedure care education.            ASSESSMENT/PLAN:    ICD-10-CM   1. Exudative age-related macular degeneration of right eye with active choroidal neovascularization (HCC)  H35.3211 OCT, Retina - OU - Both Eyes    Intravitreal Injection, Pharmacologic Agent - OD - Right Eye    aflibercept (EYLEA) SOLN 2 mg    2. 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 Long-term use of Plaquenil  Z79.899     5. Pseudophakia of both eyes  Z96.1     6. Primary open angle glaucoma (POAG) of both eyes, mild stage  H40.1131     7. Dry eyes  H04.123      1. Exudative age related macular degeneration, OD  - conversion from nonexudative ARMD noted on 9.23.22 visit  - s/p IVA OD #1 (09.23.22), #2 (10.21.22), #3 (11.18.22), #4 (12.30.22), #5 (02.13.23), #6 (03.24.23), #7 (05.08.23), #8 (06.09.23) -- IVA resistance  - s/p IVE #1 (07.24.23) - BCVA stable at 20/50  - OCT shows OD: persistent central IRF / cystic changes -- slightly improved at 4 weeks - recommend IVE OD #2 today, 08.25.23 - pt wishes to be treated with IVE - RBA of procedure discussed, questions answered - Eylea informed consent obtained and signed 07.24.23 - Avastin informed consent obtained and signed 09.23.22 - see procedure note - f/u 4 weeks -- DFE/OCT, possible injection   2. Age related macular degeneration, non-exudative OS  - intermediate stage-no change from previous visit  - The incidence, anatomy, and pathology of dry AMD, risk of progression, and the AREDS and AREDS 2 study including smoking risks discussed with patient.   - recommend Amsler grid monitoring  3. Iris/ciliary body mass OS  - pigmented lesion behind iris but anterior to PCIOL at 0730 and 0300  - 0730 lesion spans 0630 to 0830 behind dilated iris and larger than 0300  lesion  - patient asymptomatic  - pt saw Dr. Dow Adolph, [facility] on 11.01.22 who dx her with iris margin cyst  4. Plaquenil use for RA  - baseline testing done 05.20.20 -- started plaquenil early May 2020 for RA  - HVF 10-2 showed non-specific defects OU -- essentially normal baseline test  - OCT shows no obvious plaquenil-related changes, but interval development of exudative ARMD OD as above  - currently taking '200mg'$  / day per Dr. Audelia Hives instructions --> 3.29 mg/kg body wt per day  - po prednisone, currently as needed which is about once a week - and is also now on '10mg'$  po MTX weekly + folic acid  - the American Academy of Ophthalmology recommends dosing '5mg'$ /kg per day to reduce risk of retinal toxicity   - recommend consideration of using alternate medication for RA especially given co-morbidity of age-related macular degeneration OU  - managed [facility]  [facility] Dossie [facility] at [facility]  5. Pseudophakia OU  - s/p CE/IOL w/ ?canaloplasty OU by Dr. Robyne Askew at [facility]  - doing well  - monitor  6. History of Glaucoma   - not currently on gtts -- IOP 8, 10  - ?underwent canaloplasty in conjunction with CEIOL at [facility]   - referred to and now under the expert care of Dr. Kathlen Mody -- initial visit/consult 10.3.2019  7. Dry Eye Syndrome  - use AT's OU as needed  Ophthalmic Meds Ordered this visit:  Meds ordered this encounter  Medications   aflibercept (EYLEA) SOLN 2 mg     Return in about 4 weeks (around 11/13/2021) for f/u exu ARMD OD, DFE, OCT.  There are no Patient Instructions on file for this visit.  Explained the diagnoses, plan, and follow up with the patient and they expressed understanding.  Patient expressed understanding of the importance of proper follow up care.    This document serves as a record of services personally performed by Gardiner Sleeper, MD, PhD. It was created on their behalf by Renaldo Reel, [place] an ophthalmic  technician. The creation of this record is the provider's dictation and/or activities during the visit.    Electronically signed by:  Renaldo Reel, COT  10/02/21 12:05 AM  This document serves as a record of services personally performed by Gardiner Sleeper, MD, PhD. It was created on their behalf by [place] Jetty. Owens Shark, OA an ophthalmic technician. The creation of this record is the provider's dictation and/or activities during the visit.    Electronically signed by: San Jetty. Owens Shark, [place] 08.25.2023 12:05 AM  Gardiner Sleeper, M.D., Ph.D. Diseases & Surgery of the Retina and Vitreous [facility]  I have reviewed the above documentation for accuracy and completeness, and I agree with the above. Gardiner Sleeper, M.D., Ph.D. 10/17/21 12:05 AM   Abbreviations: M myopia (nearsighted); A astigmatism; H hyperopia (farsighted); P presbyopia; Mrx spectacle prescription;  CTL contact lenses; OD right eye; OS left eye; OU both eyes  XT exotropia; ET esotropia; PEK punctate epithelial keratitis; PEE punctate epithelial erosions; DES dry eye syndrome; MGD meibomian gland dysfunction; ATs artificial tears; PFAT's preservative free artificial tears; [place] nuclear sclerotic cataract; PSC posterior subcapsular cataract; ERM epi-retinal membrane; PVD posterior vitreous detachment; RD retinal detachment; DM diabetes mellitus; DR diabetic retinopathy; NPDR non-proliferative diabetic retinopathy; PDR proliferative diabetic retinopathy; CSME clinically significant macular edema; DME diabetic macular edema; dbh dot blot hemorrhages; CWS cotton wool spot; POAG primary open angle glaucoma; C/D cup-to-disc ratio; HVF humphrey visual field; GVF goldmann visual field; OCT optical coherence tomography; IOP intraocular pressure; BRVO Branch retinal vein occlusion; CRVO central retinal vein occlusion; CRAO central retinal artery occlusion; BRAO branch retinal artery occlusion; RT retinal tear; SB scleral  buckle; PPV pars plana vitrectomy; VH Vitreous hemorrhage; PRP panretinal laser photocoagulation; IVK intravitreal kenalog; VMT vitreomacular traction; MH Macular hole;  NVD neovascularization of the disc; NVE neovascularization elsewhere; AREDS age related eye disease study; ARMD age related macular degeneration; POAG primary open angle glaucoma; EBMD epithelial/anterior basement membrane dystrophy; ACIOL anterior chamber intraocular lens; IOL intraocular lens; PCIOL posterior chamber intraocular lens; Phaco/IOL phacoemulsification with intraocular lens placement; Grady photorefractive keratectomy; LASIK laser assisted in situ keratomileusis; HTN hypertension; DM diabetes mellitus; COPD chronic obstructive pulmonary disease

## 2021-10-02 NOTE — Progress Notes (Signed)
Margaret [name] is a 88 y.o. female presents to the office today for prolia:60 mg injections, per physician's orders. Original order: 09/03/21 prolia (med), 60 mg (dose),  SQ (route) was administered left arm (location) today. Patient tolerated injection. Patient due for follow up labs/provider appt: No. Date due: n/a, appt made No Patient next injection due: 04/04/22, appt made No  Octaviano Glow

## 2021-10-16 ENCOUNTER — Ambulatory Visit (INDEPENDENT_AMBULATORY_CARE_PROVIDER_SITE_OTHER): Payer: Medicare HMO | Admitting: Ophthalmology

## 2021-10-16 ENCOUNTER — Other Ambulatory Visit: Payer: Self-pay

## 2021-10-16 ENCOUNTER — Encounter (INDEPENDENT_AMBULATORY_CARE_PROVIDER_SITE_OTHER): Payer: Self-pay | Admitting: Ophthalmology

## 2021-10-16 DIAGNOSIS — D4989 Neoplasm of unspecified behavior of other specified sites: Secondary | ICD-10-CM | POA: Diagnosis not present

## 2021-10-16 DIAGNOSIS — H401131 Primary open-angle glaucoma, bilateral, mild stage: Secondary | ICD-10-CM

## 2021-10-16 DIAGNOSIS — H353211 Exudative age-related macular degeneration, right eye, with active choroidal neovascularization: Secondary | ICD-10-CM

## 2021-10-16 DIAGNOSIS — H353122 Nonexudative age-related macular degeneration, left eye, intermediate dry stage: Secondary | ICD-10-CM

## 2021-10-16 DIAGNOSIS — Z961 Presence of intraocular lens: Secondary | ICD-10-CM

## 2021-10-16 DIAGNOSIS — H04123 Dry eye syndrome of bilateral lacrimal glands: Secondary | ICD-10-CM

## 2021-10-16 DIAGNOSIS — Z79899 Other long term (current) drug therapy: Secondary | ICD-10-CM

## 2021-10-16 MED ORDER — AFLIBERCEPT 2MG/0.05ML IZ SOLN FOR KALEIDOSCOPE
2.0000 mg | INTRAVITREAL | Status: AC | PRN
  Administered 2021-10-16: 2 mg via INTRAVITREAL

## 2021-10-24 ENCOUNTER — Ambulatory Visit (INDEPENDENT_AMBULATORY_CARE_PROVIDER_SITE_OTHER): Payer: Medicare HMO

## 2021-10-24 DIAGNOSIS — Z0000 Encounter for general adult medical examination without abnormal findings: Secondary | ICD-10-CM | POA: Diagnosis not present

## 2021-10-24 NOTE — Progress Notes (Cosign Needed Addendum)
Subjective:   Margaret [name] is a 88 y.o. female who presents for Medicare Annual (Subsequent) preventive examination.  I connected with  [name] on 10/24/21 by an audio only telemedicine application and verified that I am speaking with the correct person using two identifiers.   I discussed the limitations, risks, security and privacy concerns of performing an evaluation and management service by telephone and the availability of in person appointments. I also discussed with the patient that there may be a patient responsible charge related to this service. The patient expressed understanding and verbally consented to this telephonic visit.  Location of Patient: Home Location of Provider: Office  List any persons and their role that are participating in the visit with the patient.   [place], CMA  Review of Systems    Defer to PCP Cardiac Risk Factors include: advanced age (>39mn, >>62women)     Objective:    There were no vitals filed for this visit. There is no height or weight on file to calculate BMI.     10/24/2021    9:13 AM 10/22/2020    1:14 PM 03/11/2020    1:41 AM 03/10/2020    2:23 PM  Advanced Directives  Does Patient Have a Medical Advance Directive? Yes Yes Yes Yes  Type of [place] Healthcare Power of [place] will   Does patient want to make changes to medical advance directive?   No - Patient declined   Copy of HIoniain Chart?  No - copy requested      Current Medications (verified) Outpatient Encounter Medications as of 10/24/2021  Medication Sig   albuterol (VENTOLIN HFA) 108 (90 Base) MCG/ACT inhaler Inhale 2 puffs into the lungs every 6 (six) hours as needed for wheezing or shortness of breath.   CALCIUM PO Take 1 tablet by mouth 2 (two) times daily. Slow release   cycloSPORINE (RESTASIS) 0.05 % ophthalmic emulsion 1  drop 2 (two) times daily.   folic acid (FOLVITE) 0.5 MG tablet Take 0.5 mg by mouth daily.   lactase (LACTAID) 3000 units tablet Take 2 tablets (6,000 Units total) by mouth 3 (three) times daily as needed (lactose intolerance).   levothyroxine (SYNTHROID) 50 MCG tablet Take 1 tablet (50 mcg total) by mouth daily before breakfast.   montelukast (SINGULAIR) 10 MG tablet Take 1 tablet (10 mg total) by mouth at bedtime.   Multiple Vitamins-Minerals (PRESERVISION AREDS 2) CAPS Take 1 capsule by mouth 2 (two) times daily.   polyvinyl alcohol (LIQUIFILM TEARS) 1.4 % ophthalmic solution Place 1 drop into both eyes as needed for dry eyes.   predniSONE (DELTASONE) 5 MG tablet Take 5 mg by mouth daily as needed (joint/muscle pain).   pregabalin (LYRICA) 25 MG capsule Take 1 capsule (25 mg total) by mouth at bedtime.   No facility-administered encounter medications on file as of 10/24/2021.    Allergies (verified) Augmentin [amoxicillin-pot clavulanate] and Lactose intolerance (gi)   History: Past Medical History:  Diagnosis Date   Allergy    Arthritis of left knee    Asthma    Carotid artery stenosis 10/17/2012   stable report in 2018- "moderate on right, minimal on left"   Carpal tunnel syndrome 2018   left.   Cervical radiculopathy 05/16/2015   DDD C4-C5, C5-C6, and C6-C7.  Facet arthropathy throughout cervical spine.  Normal foraminal narrowing secondary to uncovertebral  arthropathy is seen bilaterally  at C3-C4 and C5/C6   Chicken pox    DDD (degenerative disc disease), lumbar 04/26/2015   Mild levoscoliosis, right anterior listhesis of L4 and L5.  DDD at every level.  Facet arthropathy at multiple levels.   Deviated septum    Glaucoma    both eyes, mild opened angle   Heart murmur    History of colon polyps    History of fall    History of frequent urinary tract infections    klebs- pansensitive. c. 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    Pseudophakia of both eyes    PVD (peripheral vascular disease) ([place])    Rheumatic fever    88 years old    Syncope 05/17/2017   Thyroid disease    Urinary incontinence    Vertigo    had been prescribed antivert   Vitamin D deficiency    Past Surgical History:  Procedure Laterality Date   CARPAL TUNNEL RELEASE Bilateral 2019   CATARACT EXTRACTION Bilateral 1997   COLONOSCOPY  2010   NASAL SEPTUM SURGERY  2007   WRIST FRACTURE SURGERY Right 1996   Family History  Problem Relation Age of Onset   Arthritis Mother    COPD Mother    Prostate cancer Father    Arthritis Father    Hearing loss Father    Heart disease Father    Heart attack Father    Stroke Maternal Grandmother    Liver cancer Sister    Arthritis Brother    Prostate cancer Brother    COPD Brother    Heart disease Brother    Breast cancer Daughter    Blindness Maternal Grandfather    Stroke Maternal Grandfather    COPD Paternal Grandmother    Breast cancer Paternal Grandmother    Social History   Socioeconomic History   Marital status: Widowed    Spouse name: Not on file   Number of children: 3   Years of education: Not on file   Highest education level: Not on file  Occupational History   Occupation: retired  Tobacco Use   Smoking status: Former    Packs/day: 0.50    Types: Cigarettes    Start date: 1952    Quit date: 1965    Years since quitting: 58.7   Smokeless tobacco: Never  Vaping Use   Vaping Use: Never used  Substance and Sexual Activity   Alcohol use: Not Currently    Alcohol/week: 5.0 standard drinks of alcohol    Types: 5 Standard drinks or equivalent per week   Drug use: Never   Sexual activity:  Not Currently    Partners: Male  Other Topics Concern   Not on file  Social History Narrative   Marital status/children/pets: Widowed.  Moved to [place] from [place] area) 2019.  She shares a home with 1 of her daughters.  Has 1 son and 2 daughters   Education/employment: Buyer, retail of arts degree, retired Marine scientist:      -smoke alarm in the home:Yes     - wears seatbelt:  Yes     - Feels safe in their relationships: Yes      2 caffeinated beverages a day no alcohol tobacco or drug use   Social Determinants of Health   Financial Resource Strain: Low Risk  (10/24/2021)   Overall Financial Resource Strain (CARDIA)    Difficulty of Paying Living Expenses: Not hard at all  Food Insecurity: No Food Insecurity (10/24/2021)   Hunger Vital Sign    Worried About Running Out of Food in the Last Year: Never true    Ran Out of Food in the Last Year: Never true  Transportation Needs: No Transportation Needs (10/24/2021)   PRAPARE - Hydrologist (Medical): No    Lack of Transportation (Non-Medical): No  Physical Activity: Sufficiently Active (10/24/2021)   Exercise Vital Sign    Days of Exercise per Week: 7 days    Minutes of Exercise per Session: 40 min  Stress: No Stress Concern Present (10/24/2021)   [place]    Feeling of Stress : Not at all  Social Connections: Socially Isolated (10/24/2021)   Social Connection and Isolation Panel [NHANES]    Frequency of Communication with Friends and Family: More than three times a week    Frequency of Social Gatherings with Friends and Family: Three times a week    Attends Religious Services: Never    Active Member of Clubs or Organizations: No    Attends Archivist Meetings: Never    Marital Status: Widowed    Tobacco Counseling Counseling given: Not Answered   Clinical Intake:  Pre-visit preparation completed: Yes  Pain :  No/denies pain     Nutritional Status: BMI 25 -29 Overweight Nutritional Risks: None Diabetes: No  How often do you need to have someone help you when you read instructions, pamphlets, or other written materials from your doctor or pharmacy?: 4 - Often  Diabetic?no//  Interpreter Needed?: No      Activities of Daily Living    10/24/2021    9:05 AM  In your present state of health, do you have any difficulty performing the following activities:  Hearing? 1  Vision? 1  Difficulty concentrating or making decisions? 0  Walking or climbing stairs? 0  Dressing or bathing? 0  Doing errands, shopping? 0  Preparing Food and eating ? N  Using the Toilet? N  In the past six months, have you accidently leaked urine? Y  Do you have problems with loss of bowel control? Y  Managing your Medications? N  Managing your Finances? N  Housekeeping or managing your Housekeeping? N    Patient Care Team: Ma Hillock, DO as PCP - General (Family Medicine) Bernarda Caffey, MD as Consulting Physician (Ophthalmology) Konrad Felix, MD as Consulting Physician (Ophthalmology) Renda Rolls, Jennefer Bravo, MD as Referring Physician (Dermatology) Valinda Party, MD (Rheumatology)  Indicate any recent Medical Services you may have received from other than [facility] providers in the past year (date may be approximate).     Assessment:   This is a routine wellness examination for [place].  Hearing/Vision screen No results found.  Dietary issues and exercise activities discussed: Current Exercise Habits: Home exercise routine, Type of exercise: walking, Time (Minutes): 40, Frequency (Times/Week): 7, Weekly Exercise (Minutes/Week): 280, Intensity: Mild   Goals Addressed   None   Depression Screen    10/24/2021    9:10 AM 12/09/2020    9:53 AM 10/22/2020  1:13 PM 03/19/2020   11:32 AM 03/29/2018   10:20 AM  PHQ 2/9 Scores  PHQ - 2 Score 0 0 0 0 0    Fall Risk    10/24/2021    9:12 AM 12/09/2020     9:54 AM 10/22/2020    1:02 PM 03/25/2020    2:55 PM 03/29/2018   10:21 AM  Fall Risk   Falls in the past year? 1 0 0 0 1  Number falls in past yr: 0 0 0 0 0  Injury with Fall? 1 0 0 0 0  Risk for fall due to : History of fall(s);Impaired vision    Impaired vision  Follow up Falls evaluation completed Falls evaluation completed Falls evaluation completed;Falls prevention discussed Falls evaluation completed Education provided    FALL RISK PREVENTION PERTAINING TO THE HOME:  Any stairs in or around the home? Yes  If so, are there any without handrails? Yes  Home free of loose throw rugs in walkways, pet beds, electrical cords, etc? No  Adequate lighting in your home to reduce risk of falls? Yes   ASSISTIVE DEVICES UTILIZED TO PREVENT FALLS:  Life alert? No  Use of a cane, walker or w/c? No  Grab bars in the bathroom? No  Shower chair or bench in shower? No  Elevated toilet seat or a handicapped toilet? No   TIMED UP AND GO:  Was the test performed? No .  Length of time to ambulate 10 feet: n/a sec.     Cognitive Function:        10/24/2021   10:02 AM  6CIT Screen  What Year? 0 points  What month? 0 points  What time? 0 points  Count back from 20 0 points  Repeat phrase 0 points    Immunizations Immunization History  Administered Date(s) Administered   Fluad Quad(high Dose 65+) 12/09/2020   IPV 08/09/2018, 02/27/2019, 08/29/2019   Influenza, High Dose Seasonal PF 12/14/2017   Influenza-Unspecified 10/10/2019   PFIZER(Purple Top)SARS-COV-2 Vaccination 03/30/2019, 04/24/2019, 10/10/2019, 08/08/2020   PNEUMOCOCCAL CONJUGATE-20 12/09/2020   Tdap 02/22/2014   Zoster Recombinat (Shingrix) 11/07/2018, 01/08/2019   Zoster, Live 02/22/2005    TDAP status: Up to date  Flu Vaccine status: Due, Education has been provided regarding the importance of this vaccine. Advised may receive this vaccine at local pharmacy or Health Dept. Aware to provide a copy of the vaccination  record if obtained from local pharmacy or Health Dept. Verbalized acceptance and understanding.  Pneumococcal vaccine status: Up to date  Covid-19 vaccine status: Completed vaccines  Qualifies for Shingles Vaccine? Yes   Zostavax completed No   Shingrix Completed?: Yes  Screening Tests Health Maintenance  Topic Date Due   INFLUENZA VACCINE  09/22/2021   COVID-19 Vaccine (5 - Pfizer risk series) 06/22/2023 (Originally 10/03/2020)   TETANUS/TDAP  02/23/2024   Pneumonia Vaccine 23+ Years old  Completed   DEXA SCAN  Completed   Zoster Vaccines- Shingrix  Completed   HPV VACCINES  Aged Out    Health Maintenance  Health Maintenance Due  Topic Date Due   INFLUENZA VACCINE  09/22/2021    Colorectal cancer screening: No longer required.   Mammogram status: No longer required due to age.  Bone Density status: Completed 02/22/2018. Results reflect: Bone density results: OSTEOPOROSIS. Repeat every 2 years.  Lung Cancer Screening: (Low Dose CT Chest recommended if Age 43-80 years, 30 pack-year currently smoking OR have quit w/in 15years.) does not qualify.   Lung Cancer Screening  Referral: n/a  Additional Screening:  Hepatitis C Screening: does not qualify; Completed n/a  Vision Screening: Recommended annual ophthalmology exams for early detection of glaucoma and other disorders of the eye. Is the patient up to date with their annual eye exam?  Yes  Who is the provider or what is the name of the office in which the patient attends annual eye exams? Bernarda Caffey If pt is not established with a provider, would they like to be referred to a provider to establish care? No .   Dental Screening: Recommended annual dental exams for proper oral hygiene  Community Resource Referral / Chronic Care Management: CRR required this visit?  No   CCM required this visit?  No      Plan:     I have personally reviewed and noted the following in the patient's chart:   Medical and social  history Use of alcohol, tobacco or illicit drugs  Current medications and supplements including opioid prescriptions. Patient is not currently taking opioid prescriptions. Functional ability and status Nutritional status Physical activity Advanced directives List of other physicians Hospitalizations, surgeries, and ER visits in previous 12 months Vitals Screenings to include cognitive, depression, and falls Referrals and appointments  In addition, I have reviewed and discussed with patient certain preventive protocols, quality metrics, and best practice recommendations. A written personalized care plan for preventive services as well as general preventive health recommendations were provided to patient.     Octaviano Glow, CMA   10/24/2021   Nurse Notes: Non-Face to Face or Face to Face 5 minute visit Encounter   Ms. Hylton , Thank you for taking time to come for your Medicare Wellness Visit. I appreciate your ongoing commitment to your health goals. Please review the following plan we discussed and let me know if I can assist you in the future.   These are the goals we discussed:  Goals      Patient Stated     Maintain current lifestyle        This is a list of the screening recommended for you and due dates:  Health Maintenance  Topic Date Due   Flu Shot  09/22/2021   COVID-19 Vaccine (5 - Pfizer risk series) 06/22/2023*   Tetanus Vaccine  02/23/2024   Pneumonia Vaccine  Completed   DEXA scan (bone density measurement)  Completed   Zoster (Shingles) Vaccine  Completed   HPV Vaccine  Aged Out  *Topic was postponed. The date shown is not the original due date.

## 2021-10-24 NOTE — Patient Instructions (Signed)

## 2021-11-17 NOTE — Progress Notes (Signed)
[facility] Note  11/20/2021    CHIEF COMPLAINT Patient presents for Retina Follow Up   HISTORY OF PRESENT ILLNESS: Margaret [name] is a 88 y.o. female who presents to the clinic today for:   HPI     Retina Follow Up   Patient presents with  Wet AMD.  In right eye.  This started 5 weeks ago.  I, the attending physician,  performed the HPI with the patient and updated documentation appropriately.        Comments   Patient here for 5 weeks retina follow up for exu ARMD OD. Patient states vision may be a little bit better. No eye pain.       Last edited by Bernarda Caffey, MD on 11/20/2021 11:27 AM.    Patient feels like she is reading a little better  Referring physician: Ma Hillock, DO [address] [place],  [place] [address]  HISTORICAL INFORMATION:  Selected notes from the MEDICAL RECORD NUMBER Self referral for macular degeneration LEE: 03.21.19 (Dr. Park Liter in [place], [place]) [BCVA: OD: 20/40 OS: 20/40] Ocular Hx-POAG, non-exu ARMD, DES, pseudo, YAG PMH-astham, arthritis,    CURRENT MEDICATIONS: Current Outpatient Medications (Ophthalmic Drugs)  Medication Sig   cycloSPORINE (RESTASIS) 0.05 % ophthalmic emulsion 1 drop 2 (two) times daily.   polyvinyl alcohol (LIQUIFILM TEARS) 1.4 % ophthalmic solution Place 1 drop into both eyes as needed for dry eyes.   No current facility-administered medications for this visit. (Ophthalmic Drugs)   Current Outpatient Medications (Other)  Medication Sig   albuterol (VENTOLIN HFA) 108 (90 Base) MCG/ACT inhaler Inhale 2 puffs into the lungs every 6 (six) hours as needed for wheezing or shortness of breath.   CALCIUM PO Take 1 tablet by mouth 2 (two) times daily. Slow release   folic acid (FOLVITE) 0.5 MG tablet Take 0.5 mg by mouth daily.   lactase (LACTAID) 3000 units tablet Take 2 tablets (6,000 Units total) by mouth 3 (three) times daily as needed (lactose intolerance).   levothyroxine (SYNTHROID) 50  MCG tablet Take 1 tablet (50 mcg total) by mouth daily before breakfast.   montelukast (SINGULAIR) 10 MG tablet Take 1 tablet (10 mg total) by mouth at bedtime.   Multiple Vitamins-Minerals (PRESERVISION AREDS 2) CAPS Take 1 capsule by mouth 2 (two) times daily.   predniSONE (DELTASONE) 5 MG tablet Take 5 mg by mouth daily as needed (joint/muscle pain).   pregabalin (LYRICA) 25 MG capsule Take 1 capsule (25 mg total) by mouth at bedtime.   No current facility-administered medications for this visit. (Other)   REVIEW OF SYSTEMS: ROS   Positive for: Musculoskeletal, Endocrine, Eyes Negative for: Constitutional, Gastrointestinal, Neurological, Skin, Genitourinary, HENT, Cardiovascular, Respiratory, Psychiatric, Allergic/Imm, Heme/Lymph Last edited by Theodore Demark, COA on 11/20/2021  9:04 AM.     ALLERGIES Allergies  Allergen Reactions   Augmentin [Amoxicillin-Pot Clavulanate] Swelling and Rash    Swelling in face   Lactose Intolerance (Gi)    PAST MEDICAL HISTORY Past Medical History:  Diagnosis Date   Allergy    Arthritis of left knee    Asthma    Carotid artery stenosis 10/17/2012   stable report in 2018- "moderate on right, minimal on left"   Carpal tunnel syndrome 2018   left.   Cervical radiculopathy 05/16/2015   DDD C4-C5, C5-C6, and C6-C7.  Facet arthropathy throughout cervical spine.  Normal foraminal narrowing secondary to uncovertebral  arthropathy is seen bilaterally at C3-C4 and C5/C6  Chicken pox    DDD (degenerative disc disease), lumbar 04/26/2015   Mild levoscoliosis, right anterior listhesis of L4 and L5.  DDD at every level.  Facet arthropathy at multiple levels.   Deviated septum    Glaucoma    both eyes, mild opened angle   Heart murmur    History of colon polyps    History of fall    History of frequent urinary tract infections    klebs- pansensitive. c. freundii - resitant to cefazolin and augementin   Hypothyroidism    Intra-abdominal abscess  ([place]) 03/10/2020   Iron deficiency anemia    prior pcp told her to take Fe 325 QD   Macular degeneration    h/o retinal edema   Mitral valve prolapse 08/27/2011   Murmur, cardiac 10/17/2012   Osteoarthritis    Osteoporosis    Pneumonia    Positive TB test    In college    Pseudophakia of both eyes    PVD (peripheral vascular disease) ([place])    Rheumatic fever    88 years old    Syncope 05/17/2017   Thyroid disease    Urinary incontinence    Vertigo    had been prescribed antivert   Vitamin D deficiency    Past Surgical History:  Procedure Laterality Date   CARPAL TUNNEL RELEASE Bilateral 2019   CATARACT EXTRACTION Bilateral 1997   COLONOSCOPY  2010   NASAL SEPTUM SURGERY  2007   WRIST FRACTURE SURGERY Right 1996   FAMILY HISTORY Family History  Problem Relation Age of Onset   Arthritis Mother    COPD Mother    Prostate cancer Father    Arthritis Father    Hearing loss Father    Heart disease Father    Heart attack Father    Stroke Maternal Grandmother    Liver cancer Sister    Arthritis Brother    Prostate cancer Brother    COPD Brother    Heart disease Brother    Breast cancer Daughter    Blindness Maternal Grandfather    Stroke Maternal Grandfather    COPD Paternal Grandmother    Breast cancer Paternal Grandmother    SOCIAL HISTORY Social History   Tobacco Use   Smoking status: Former    Packs/day: 0.50    Types: Cigarettes    Start date: 1952    Quit date: 1965    Years since quitting: 58.7   Smokeless tobacco: Never  Vaping Use   Vaping Use: Never used  Substance Use Topics   Alcohol use: Not Currently    Alcohol/week: 5.0 standard drinks of alcohol    Types: 5 Standard drinks or equivalent per week   Drug use: Never       OPHTHALMIC EXAM:  Base Eye Exam     Visual Acuity (Snellen - Linear)       Right Left   Dist cc 20/50 20/25 -2   Dist ph cc NI NI    Correction: Glasses         Tonometry (Tonopen, 9:02 AM)       Right  Left   Pressure 13 14         Pupils       Dark Light Shape React APD   Right 3 2 Round Brisk None   Left 3 2 Round Brisk None         Visual Fields (Counting fingers)       Left Right    Full  Full         Extraocular Movement       Right Left    Full, Ortho Full, Ortho         Neuro/Psych     Oriented x3: Yes   Mood/Affect: Normal         Dilation     Both eyes: 1.0% Mydriacyl, 2.5% Phenylephrine @ 9:02 AM           Slit Lamp and Fundus Exam     External Exam       Right Left   External Normal Normal         Slit Lamp Exam       Right Left   Lids/Lashes Dermatochalasis - upper lid, Telangiectasia Dermatochalasis - upper lid, mild Meibomian gland dysfunction   Conjunctiva/Sclera Temporal Pinguecula White and quiet   Cornea Arcus, 1+ Punctate epithelial erosions, mild tear film debris, fine endo pigment Arcus, 1-2+ inferior Punctate epithelial erosions, mild tear film debris, fine endo pigment   Anterior Chamber Deep and quiet, narrow temporal angle Deep and quiet   Iris Round and dilated Round and dilated, pigmented mass at 0200 and 0730 behind iris, anterior to IOL   Lens PC IOL in good position with open PC PC IOL in good position with open PC   Anterior Vitreous Vitreous syneresis Vitreous syneresis, Posterior vitreous detachment         Fundus Exam       Right Left   Disc 360 Peripapillary atrophy, Sharp rim, 2+ Pallor sharp rim, temporal PPA, mild Pallor   C/D Ratio 0.4 0.3   Macula Flat, Blunted foveal reflex, drusen, RPE mottling and clumping, +central IRF / cystic changes -- persistent, punctate IRH --stably resolved, no SRH, Early RPE atrophy Flat, Blunted foveal reflex, drusen, RPE mottling and clumping, no heme or edema   Vessels attenuated, Tortuous attenuated, Tortuous   Periphery Attached, scattered Reticular degeneration, pigmented cystoid degeneration temporally, No heme Attached, scattered Reticular degeneration, No heme            Refraction     Wearing Rx       Sphere Cylinder Axis Add   Right -0.75 +2.25 175 +2.75   Left -0.25 +1.25 165 +2.75           IMAGING AND PROCEDURES  Imaging and Procedures for '@TODAY'$ @  OCT, Retina - OU - Both Eyes       Right Eye Quality was good. Central Foveal Thickness: 321. Progression has been stable. Findings include no SRF, abnormal foveal contour, retinal drusen , intraretinal hyper-reflective material, epiretinal membrane, intraretinal fluid, outer retinal atrophy (persistent central IRF / cystic changes -- slightly increased, Partial PVD).   Left Eye Quality was good. Central Foveal Thickness: 300. Progression has been stable. Findings include normal foveal contour, no IRF, no SRF, retinal drusen , outer retinal atrophy (Trace ERM, Partial PVD).   Notes *Images captured and stored on drive  Diagnosis / Impression:  OD: Exudative ARMD - persistent central IRF / cystic changes -- slightly improved, Partial PVD OS: Nonexudative ARMD OS Partial PVD OU  Clinical management:  See below  Abbreviations: NFP - Normal foveal profile. CME - cystoid macular edema. PED - pigment epithelial detachment. IRF - intraretinal fluid. SRF - subretinal fluid. EZ - ellipsoid zone. ERM - epiretinal membrane. ORA - outer retinal atrophy. ORT - outer retinal tubulation. SRHM - subretinal hyper-reflective material       Intravitreal Injection, Pharmacologic Agent - OD - Right  Eye       Time Out 11/20/2021. 9:35 AM. Confirmed correct patient, procedure, site, and patient consented.   Anesthesia Topical anesthesia was used. Anesthetic medications included Lidocaine 2%, Proparacaine 0.5%.   Procedure Preparation included 5% betadine to ocular surface, eyelid speculum. A (32g) needle was used.   Injection: 2 mg aflibercept 2 MG/0.05ML   Route: Intravitreal, Site: Right Eye   NDC: [id number], Lot: [id number], Expiration date: 07/23/2022, Waste: 0 mL    Post-op Post injection exam found visual acuity of at least counting fingers. The patient tolerated the procedure well. There were no complications. The patient received written and verbal post procedure care education. Post injection medications were not given.            ASSESSMENT/PLAN:    ICD-10-CM   1. Exudative age-related macular degeneration of right eye with active choroidal neovascularization (HCC)  H35.3211 OCT, Retina - OU - Both Eyes    Intravitreal Injection, Pharmacologic Agent - OD - Right Eye    aflibercept (EYLEA) SOLN 2 mg    2. 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 Long-term use of Plaquenil  Z79.899     5. Pseudophakia of both eyes  Z96.1     6. Primary open angle glaucoma (POAG) of both eyes, mild stage  H40.1131     7. Dry eyes  H04.123      1. Exudative age related macular degeneration, OD  - f/u delayed to 5 wks from 4  - conversion from nonexudative ARMD noted on 9.23.22 visit  - s/p IVA OD #1 (09.23.22), #2 (10.21.22), #3 (11.18.22), #4 (12.30.22), #5 (02.13.23), #6 (03.24.23), #7 (05.08.23), #8 (06.09.23) -- IVA resistance  - s/p IVE #1 (07.24.23), #2 (08.25.23) - BCVA stable at 20/50  - OCT shows OD: persistent central IRF / cystic changes -- slightly increased at 5 weeks - recommend IVE OD #3 today, 09.29.23 w/ f/u back to 4 wks - pt wishes to be treated with IVE - RBA of procedure discussed, questions answered - Eylea informed consent obtained and signed 07.24.23 - Avast[place] informed consent obtained and signed 09.23.22 - see procedure note - f/u 4 weeks -- DFE/OCT, possible injection   2. Age related macular degeneration, non-exudative OS  - intermediate stage-no change from previous visit  - The incidence, anatomy, and pathology of dry AMD, risk of progression, and the AREDS and AREDS 2 study including smoking risks discussed with patient.   - recommend  [place] grid monitoring  3. Iris/ciliary body mass OS  - pigmented lesion behind iris but anterior to PCIOL at 0730 and 0300  - 0730 lesion spans 0630 to 0830 behind dilated iris and larger than 0300 lesion  - patient asymptomatic  - pt saw Dr. Dow Adolph, [facility] on 11.01.22 who dx her with iris margin cyst  4. Plaquenil use for RA  - baseline testing done 05.20.20 -- started plaquenil early May 2020 for RA  - HVF 10-2 showed non-specific defects OU -- essentially normal baseline test  - OCT shows no obvious plaquenil-related changes, but interval development of exudative ARMD OD as above  - currently taking '200mg'$  / day per Dr. Audelia Hives instructions --> 3.29 mg/kg body wt per day  - po prednisone, currently as needed which is about once a week - and is also now on '10mg'$  po MTX weekly + folic acid  - the American  [employer]y [employer] Ophthal[employer] recommends dosing '5mg'$ /kg per day to reduce risk of retinal toxicity   - recommend consideration of using alternate medication for RA especially given co-morbidity of age-related macular degeneration OU  - managed by [facility] Dossie Der at [facility]  5. Pseudophakia OU  - s/p CE/IOL w/ ?canaloplasty OU by Dr. Robyne Askew at [facility]  - doing well  - monitor  6. History of Glaucoma   - not currently on gtts -- IOP 13,14  - ?underwent canaloplasty in conjunction with CEIOL at [facility]   - referred to and now under the expert care of Dr. Kathlen Mody -- initial visit/consult 10.3.2019  7. Dry Eye Syndrome  - use AT's OU as needed  Ophthalmic Meds Ordered this visit:  Meds ordered this encounter  Medications   aflibercept (EYLEA) SOLN 2 mg     Return in about 4 weeks (around 12/18/2021) for f/u exu ARMD OU, DFE, OCT.  There are no Patient Instructions on file for this visit.  Explained the diagnoses, plan, and follow up with the patient and they expressed understanding.  Patient expressed understanding of the  importance of proper follow up care.    This document serves as a record of services personally performed by Gardiner Sleeper, MD, PhD. It was created on their behalf by San Jetty. Owens Shark, OA an ophthalmic technician. The creation of this record is the provider's dictation and/or activities during the visit.    Electronically signed by: San Jetty. Owens Shark, [place] 09.26.2023 11:28 AM   Gardiner Sleeper, M.D., Ph.D. Diseases & Surgery of the Retina and Vitreous [facility]  I have reviewed the above documentation for accuracy and completeness, and I agree with the above. Gardiner Sleeper, M.D., Ph.D. 11/20/21 11:30 AM   Abbreviations: M myopia (nearsighted); A astigmatism; H hyperopia (farsighted); P presbyopia; Mrx spectacle prescription;  CTL contact lenses; OD right eye; OS left eye; OU both eyes  XT exotropia; ET esotropia; PEK punctate epithelial keratitis; PEE punctate epithelial erosions; DES dry eye syndrome; MGD meibomian gland dysfunction; ATs artificial tears; PFAT's preservative free artificial tears; [place] nuclear sclerotic cataract; PSC posterior subcapsular cataract; ERM epi-retinal membrane; PVD posterior vitreous detachment; RD retinal detachment; DM diabetes mellitus; DR diabetic retinopathy; NPDR non-proliferative diabetic retinopathy; PDR proliferative diabetic retinopathy; CSME clinically significant macular edema; DME diabetic macular edema; dbh dot blot hemorrhages; CWS cotton wool spot; POAG primary open angle glaucoma; C/D cup-to-disc ratio; HVF humphrey visual field; GVF goldmann visual field; OCT optical coherence tomography; IOP intraocular pressure; BRVO Branch retinal vein occlusion; CRVO central retinal vein occlusion; CRAO central retinal artery occlusion; BRAO branch retinal artery occlusion; RT retinal tear; SB scleral buckle; PPV pars plana vitrectomy; VH Vitreous hemorrhage; PRP panretinal laser photocoagulation; IVK intravitreal kenalog; VMT vitreomacular  traction; MH Macular hole;  NVD neovascularization of the disc; NVE neovascularization elsewhere; AREDS age related eye disease study; ARMD age related macular degeneration; POAG primary open angle glaucoma; EBMD epithelial/anterior basement membrane dystrophy; ACIOL anterior chamber intraocular lens; IOL intraocular lens; PCIOL posterior chamber intraocular lens; Phaco/IOL phacoemulsification with intraocular lens placement; [place] photorefractive keratectomy; LASIK laser assisted in situ keratomileusis; HTN hypertension; DM diabetes mellitus; COPD chronic obstructive pulmonary disease

## 2021-11-19 ENCOUNTER — Encounter (INDEPENDENT_AMBULATORY_CARE_PROVIDER_SITE_OTHER): Payer: Medicare HMO | Admitting: Ophthalmology

## 2021-11-19 DIAGNOSIS — H401131 Primary open-angle glaucoma, bilateral, mild stage: Secondary | ICD-10-CM

## 2021-11-19 DIAGNOSIS — D4989 Neoplasm of unspecified behavior of other specified sites: Secondary | ICD-10-CM

## 2021-11-19 DIAGNOSIS — H353211 Exudative age-related macular degeneration, right eye, with active choroidal neovascularization: Secondary | ICD-10-CM

## 2021-11-19 DIAGNOSIS — Z79899 Other long term (current) drug therapy: Secondary | ICD-10-CM

## 2021-11-19 DIAGNOSIS — H04123 Dry eye syndrome of bilateral lacrimal glands: Secondary | ICD-10-CM

## 2021-11-19 DIAGNOSIS — H353122 Nonexudative age-related macular degeneration, left eye, intermediate dry stage: Secondary | ICD-10-CM

## 2021-11-19 DIAGNOSIS — Z961 Presence of intraocular lens: Secondary | ICD-10-CM

## 2021-11-20 ENCOUNTER — Encounter (INDEPENDENT_AMBULATORY_CARE_PROVIDER_SITE_OTHER): Payer: Self-pay | Admitting: Ophthalmology

## 2021-11-20 ENCOUNTER — Ambulatory Visit (INDEPENDENT_AMBULATORY_CARE_PROVIDER_SITE_OTHER): Payer: Medicare HMO | Admitting: Ophthalmology

## 2021-11-20 DIAGNOSIS — H04123 Dry eye syndrome of bilateral lacrimal glands: Secondary | ICD-10-CM

## 2021-11-20 DIAGNOSIS — D4989 Neoplasm of unspecified behavior of other specified sites: Secondary | ICD-10-CM

## 2021-11-20 DIAGNOSIS — H353211 Exudative age-related macular degeneration, right eye, with active choroidal neovascularization: Secondary | ICD-10-CM

## 2021-11-20 DIAGNOSIS — Z79899 Other long term (current) drug therapy: Secondary | ICD-10-CM | POA: Diagnosis not present

## 2021-11-20 DIAGNOSIS — Z961 Presence of intraocular lens: Secondary | ICD-10-CM | POA: Diagnosis not present

## 2021-11-20 DIAGNOSIS — H353122 Nonexudative age-related macular degeneration, left eye, intermediate dry stage: Secondary | ICD-10-CM | POA: Diagnosis not present

## 2021-11-20 DIAGNOSIS — H401131 Primary open-angle glaucoma, bilateral, mild stage: Secondary | ICD-10-CM

## 2021-11-20 MED ORDER — AFLIBERCEPT 2MG/0.05ML IZ SOLN FOR KALEIDOSCOPE
2.0000 mg | INTRAVITREAL | Status: AC | PRN
  Administered 2021-11-20: 2 mg via INTRAVITREAL

## 2021-12-04 NOTE — Progress Notes (Signed)
[facility] Note  12/18/2021    CHIEF COMPLAINT Patient presents for Retina Follow Up   HISTORY OF PRESENT ILLNESS: Margaret [name] is a 88 y.o. female who presents to the clinic today for:   HPI     Retina Follow Up   Patient presents with  Wet AMD.  In right eye.  This started 4 weeks ago.  Duration of 4.  I, the attending physician,  performed the HPI with the patient and updated documentation appropriately.        Comments   4 week retina follow up ARMD OU IVE OD pt states vision has been stable denies flashes or floaters she has noticed some eye irritation she has been using Zaditor seems to help some        Last edited by Bernarda Caffey, MD on 12/18/2021  9:19 PM.    Patient states vision is "mediocre", she states vision varies greatly from time to time and she has sinus problems right now, she is using Aeronautical engineer  Referring physician: Howard Pouch A, DO [address] [place],   [id number]  HISTORICAL INFORMATION:  Selected notes from the [place] referral for macular degeneration LEE: 03.21.19 (Dr. Park Liter in [place], [place]) [BCVA: OD: 20/40 OS: 20/40] Ocular Hx-POAG, non-exu ARMD, DES, pseudo, YAG PMH-astham, arthritis,    CURRENT MEDICATIONS: Current Outpatient Medications (Ophthalmic Drugs)  Medication Sig   cycloSPORINE (RESTASIS) 0.05 % ophthalmic emulsion 1 drop 2 (two) times daily.   polyvinyl alcohol (LIQUIFILM TEARS) 1.4 % ophthalmic solution Place 1 drop into both eyes as needed for dry eyes.   No current facility-administered medications for this visit. (Ophthalmic Drugs)   Current Outpatient Medications (Other)  Medication Sig   albuterol (VENTOLIN HFA) 108 (90 Base) MCG/ACT inhaler Inhale 2 puffs into the lungs every 6 (six) hours as needed for wheezing or shortness of breath.   CALCIUM PO Take 1 tablet by mouth 2 (two) times daily. Slow release   folic acid (FOLVITE) 0.5 MG tablet Take 0.5 mg by mouth  daily.   lactase (LACTAID) 3000 units tablet Take 2 tablets (6,000 Units total) by mouth 3 (three) times daily as needed (lactose intolerance).   levothyroxine (SYNTHROID) 50 MCG tablet Take 1 tablet (50 mcg total) by mouth daily before breakfast.   montelukast (SINGULAIR) 10 MG tablet Take 1 tablet (10 mg total) by mouth at bedtime.   Multiple Vitamins-Minerals (PRESERVISION AREDS 2) CAPS Take 1 capsule by mouth 2 (two) times daily.   predniSONE (DELTASONE) 5 MG tablet Take 5 mg by mouth daily as needed (joint/muscle pain).   pregabalin (LYRICA) 25 MG capsule Take 1 capsule (25 mg total) by mouth at bedtime.   No current facility-administered medications for this visit. (Other)   REVIEW OF SYSTEMS: ROS   Positive for: Eyes Negative for: Constitutional, Gastrointestinal, Neurological, Skin, Genitourinary, Musculoskeletal, HENT, Endocrine, Cardiovascular, Respiratory, Psychiatric, Allergic/Imm, Heme/Lymph Last edited by Bernarda Caffey, MD on 12/18/2021  9:19 PM.     ALLERGIES Allergies  Allergen Reactions   Augmentin [Amoxicillin-Pot Clavulanate] Swelling and Rash    Swelling in face   Lactose Intolerance (Gi)    PAST MEDICAL HISTORY Past Medical History:  Diagnosis Date   Allergy    Arthritis of left knee    Asthma    Carotid artery stenosis 10/17/2012   stable report in 2018- "moderate on right, minimal on left"   Carpal tunnel syndrome 2018   left.   Cervical radiculopathy  05/16/2015   DDD C4-C5, C5-C6, and C6-C7.  Facet arthropathy throughout cervical spine.  Normal foraminal narrowing secondary to uncovertebral  arthropathy is seen bilaterally at C3-C4 and C5/C6   Chicken pox    DDD (degenerative disc disease), lumbar 04/26/2015   Mild levoscoliosis, right anterior listhesis of L4 and L5.  DDD at every level.  Facet arthropathy at multiple levels.   Deviated septum    Glaucoma    both eyes, mild opened angle   Heart murmur    History of colon polyps    History of fall     History of frequent urinary tract infections    klebs- pansensitive. c. freundii - resitant to cefazolin and augementin   Hypothyroidism    Intra-abdominal abscess ([place]) 03/10/2020   Iron deficiency anemia    prior pcp told her to take Fe 325 QD   Macular degeneration    h/o retinal edema   Mitral valve prolapse 08/27/2011   Murmur, cardiac 10/17/2012   Osteoarthritis    Osteoporosis    Pneumonia    Positive TB test    In college    Pseudophakia of both eyes    PVD (peripheral vascular disease) ([place])    Rheumatic fever    88 years old    Syncope 05/17/2017   Thyroid disease    Urinary incontinence    Vertigo    had been prescribed antivert   Vitamin D deficiency    Past Surgical History:  Procedure Laterality Date   CARPAL TUNNEL RELEASE Bilateral 2019   CATARACT EXTRACTION Bilateral 1997   COLONOSCOPY  2010   NASAL SEPTUM SURGERY  2007   WRIST FRACTURE SURGERY Right 1996   FAMILY HISTORY Family History  Problem Relation Age of Onset   Arthritis Mother    COPD Mother    Prostate cancer Father    Arthritis Father    Hearing loss Father    Heart disease Father    Heart attack Father    Stroke Maternal Grandmother    Liver cancer Sister    Arthritis Brother    Prostate cancer Brother    COPD Brother    Heart disease Brother    Breast cancer Daughter    Blindness Maternal Grandfather    Stroke Maternal Grandfather    COPD Paternal Grandmother    Breast cancer Paternal Grandmother    SOCIAL HISTORY Social History   Tobacco Use   Smoking status: Former    Packs/day: 0.50    Types: Cigarettes    Start date: 1952    Quit date: 1965    Years since quitting: 58.8   Smokeless tobacco: Never  Vaping Use   Vaping Use: Never used  Substance Use Topics   Alcohol use: Not Currently    Alcohol/week: 5.0 standard drinks of alcohol    Types: 5 Standard drinks or equivalent per week   Drug use: Never       OPHTHALMIC EXAM:  Base Eye Exam     Visual  Acuity (Snellen - Linear)       Right Left   Dist cc 20/50 -2 20/30 -1   Dist ph cc NI NI    Correction: Glasses         Tonometry (Tonopen, 1:02 PM)       Right Left   Pressure 14 13         Pupils       Dark Light Shape React APD   Right 3 2 Round Brisk  None   Left 3 2 Round Brisk None         Visual Fields       Left Right    Full Full         Extraocular Movement       Right Left    Full, Ortho Full, Ortho         Neuro/Psych     Oriented x3: Yes   Mood/Affect: Normal         Dilation     Both eyes: 2.5% Phenylephrine @ 1:02 PM           Slit Lamp and Fundus Exam     External Exam       Right Left   External Normal Normal         Slit Lamp Exam       Right Left   Lids/Lashes Dermatochalasis - upper lid, Telangiectasia Dermatochalasis - upper lid, mild Meibomian gland dysfunction   Conjunctiva/Sclera Temporal Pinguecula White and quiet   Cornea Arcus, 1+ Punctate epithelial erosions, mild tear film debris, fine endo pigment Arcus, 1-2+ inferior Punctate epithelial erosions, mild tear film debris, fine endo pigment   Anterior Chamber Deep and quiet, narrow temporal angle Deep and quiet   Iris Round and dilated Round and dilated, pigmented mass at 0200 and 0730 behind iris, anterior to IOL   Lens PC IOL in good position with open PC PC IOL in good position with open PC   Anterior Vitreous Vitreous syneresis Vitreous syneresis, Posterior vitreous detachment         Fundus Exam       Right Left   Disc 360 Peripapillary atrophy, Sharp rim, 2+ Pallor sharp rim, temporal PPA, mild Pallor   C/D Ratio 0.4 0.3   Macula Flat, Blunted foveal reflex, drusen, RPE mottling and clumping, +central IRF / cystic changes -- persistent, punctate IRH -- stably resolved, no SRH, early RPE atrophy Flat, Blunted foveal reflex, drusen, RPE mottling and clumping, no heme or edema   Vessels attenuated, Tortuous attenuated, Tortuous   Periphery  Attached, scattered Reticular degeneration, pigmented cystoid degeneration temporally, No heme Attached, scattered Reticular degeneration, No heme           Refraction     Wearing Rx       Sphere Cylinder Axis Add   Right -0.75 +2.25 175 +2.75   Left -0.25 +1.25 165 +2.75           IMAGING AND PROCEDURES  Imaging and Procedures for '@TODAY'$ @  OCT, Retina - OU - Both Eyes       Right Eye Quality was good. Central Foveal Thickness: 323. Progression has been stable. Findings include no SRF, abnormal foveal contour, retinal drusen , intraretinal hyper-reflective material, epiretinal membrane, intraretinal fluid, outer retinal atrophy (persistent central IRF / cystic changes, Partial PVD).   Left Eye Quality was good. Central Foveal Thickness: 293. Progression has been stable. Findings include normal foveal contour, no IRF, no SRF, retinal drusen , outer retinal atrophy (Trace ERM, Partial PVD).   Notes *Images captured and stored on drive  Diagnosis / Impression:  OD: Exudative ARMD - persistent central IRF / cystic changes, Partial PVD OS: Nonexudative ARMD OS Partial PVD OU  Clinical management:  See below  Abbreviations: NFP - Normal foveal profile. CME - cystoid macular edema. PED - pigment epithelial detachment. IRF - intraretinal fluid. SRF - subretinal fluid. EZ - ellipsoid zone. ERM - epiretinal membrane. ORA - outer retinal atrophy.  ORT - outer retinal tubulation. SRHM - subretinal hyper-reflective material       Intravitreal Injection, Pharmacologic Agent - OD - Right Eye       Time Out 12/18/2021. 1:00 PM. Confirmed correct patient, procedure, site, and patient consented.   Anesthesia Topical anesthesia was used. Anesthetic medications included Lidocaine 2%, Proparacaine 0.5%.   Procedure Preparation included 5% betadine to ocular surface, eyelid speculum. A (32g) needle was used.   Injection: 2 mg aflibercept 2 MG/0.05ML   Route: Intravitreal,  Site: Right Eye   NDC: [id number], Lot: [id number], Expiration date: 03/25/2023, Waste: 0 mL   Post-op Post injection exam found visual acuity of at least counting fingers. The patient tolerated the procedure well. There were no complications. The patient received written and verbal post procedure care education. Post injection medications were not given.            ASSESSMENT/PLAN:    ICD-10-CM   1. Exudative age-related macular degeneration of right eye with active choroidal neovascularization (HCC)  H35.3211 OCT, Retina - OU - Both Eyes    Intravitreal Injection, Pharmacologic Agent - OD - Right Eye    aflibercept (EYLEA) SOLN 2 mg    2. 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 Long-term use of Plaquenil  Z79.899     5. Pseudophakia of both eyes  Z96.1     6. Primary open angle glaucoma (POAG) of both eyes, mild stage  H40.1131     7. Dry eyes  H04.123       1. Exudative age related macular degeneration, OD  - conversion from nonexudative ARMD noted on 9.23.22 visit  - s/p IVA OD #1 (09.23.22), #2 (10.21.22), #3 (11.18.22), #4 (12.30.22), #5 (02.13.23), #6 (03.24.23), #7 (05.08.23), #8 (06.09.23) -- IVA resistance  - s/p IVE OD #1 (07.24.23), #2 (08.25.23), #3 (09.29.23) - BCVA stable at 20/50  - OCT shows OD: persistent central IRF / cystic changes  - recommend IVE OD #4 today,10.27.23 w/ f/u in 4-5 wks - pt wishes to be treated with IVE - RBA of procedure discussed, questions answered - Eylea informed consent obtained and signed 07.24.23 - Avastin informed consent obtained and signed 09.23.22 - see procedure note - will check Vabysmo auth for next visit due to persistent IRF - f/u 4-5 weeks -- DFE/OCT, possible injection   2. Age related macular degeneration, non-exudative OS  - intermediate stage-no change from previous visit  - The incidence, anatomy, and pathology of dry AMD, risk  of progression, and the AREDS and AREDS 2 study including smoking risks discussed with patient.   - recommend Amsler grid monitoring  3. Iris/ciliary body mass OS  - pigmented lesion behind iris but anterior to PCIOL at 0730 and 0300  - 0730 lesion spans 0630 to 0830 behind dilated iris and larger than 0300 lesion  - patient asymptomatic  - pt saw Dr. Dow Adolph, [facility] on 11.01.22 who dx her with iris margin cyst  4. Plaquenil use for RA  - baseline testing done 05.20.20 -- started plaquenil early May 2020 for RA  - HVF 10-2 showed non-specific defects OU -- essentially normal baseline test  - OCT shows no obvious plaquenil-related changes, but interval development of exudative ARMD OD as above  - currently taking '200mg'$  / day per Dr. Audelia Hives instructions --> 3.29 mg/kg body wt per day  - po prednisone, currently as  needed which is about once a week - and is also now on '10mg'$  po MTX weekly + folic acid  - the American Academy of Ophthalmology recommends dosing '5mg'$ /kg per day to reduce risk of retinal toxicity   - recommend consideration of using alternate medication for RA especially given co-morbidity of age-related macular degeneration OU  - managed by [facility] Dossie Der [facility]  5. Pseudophakia OU  - s/p CE/IOL w/ ?canaloplasty OU by Dr. Robyne Askew at [facility]  - doing well  - monitor  6. History of Glaucoma   - not currently on gtts -- IOP 13,14  - ?underwent canaloplasty in conjunction with CEIOL at [facility]   - referred to and now under the expert care of Dr. Kathlen Mody -- initial visit/consult 10.3.2019  7. Dry Eye Syndrome  - use AT's OU as needed  Ophthalmic Meds Ordered this visit:  Meds ordered this encounter  Medications   aflibercept (EYLEA) SOLN 2 mg     Return for f/u 4-5 weeks, exu ARMD OD, DFE, OCT.  There are no Patient Instructions on file for this visit.  Explained the diagnoses, plan, and follow up with the  patient and they expressed understanding.  Patient expressed understanding of the importance of proper follow up care.   This document serves as a record of services personally performed by Gardiner Sleeper, MD, PhD. It was created on their behalf by Renaldo Reel, [place] an ophthalmic technician. The creation of this record is the provider's dictation and/or activities during the visit.    Electronically signed by:  Renaldo Reel, COT  10.13.23 9:19 PM  This document serves as a record of services personally performed by Gardiner Sleeper, MD, PhD. It was created on their behalf by San Jetty. Owens Shark, OA an ophthalmic technician. The creation of this record is the provider's dictation and/or activities during the visit.    Electronically signed by: San Jetty. Owens Shark, [place] 10.27.2023 9:19 PM  Gardiner Sleeper, M.D., Ph.D. Diseases & Surgery of the Retina and Vitreous [facility]  I have reviewed the above documentation for accuracy and completeness, and I agree with the above. Gardiner Sleeper, M.D., Ph.D. 12/18/21 9:22 PM  Abbreviations: M myopia (nearsighted); A astigmatism; H hyperopia (farsighted); P presbyopia; Mrx spectacle prescription;  CTL contact lenses; OD right eye; OS left eye; OU both eyes  XT exotropia; ET esotropia; PEK punctate epithelial keratitis; PEE punctate epithelial erosions; DES dry eye syndrome; MGD meibomian gland dysfunction; ATs artificial tears; PFAT's preservative free artificial tears; [place] nuclear sclerotic cataract; PSC posterior subcapsular cataract; ERM epi-retinal membrane; PVD posterior vitreous detachment; RD retinal detachment; DM diabetes mellitus; DR diabetic retinopathy; NPDR non-proliferative diabetic retinopathy; PDR proliferative diabetic retinopathy; CSME clinically significant macular edema; DME diabetic macular edema; dbh dot blot hemorrhages; CWS cotton wool spot; POAG primary open angle glaucoma; C/D cup-to-disc ratio; HVF humphrey  visual field; GVF goldmann visual field; OCT optical coherence tomography; IOP intraocular pressure; BRVO Branch retinal vein occlusion; CRVO central retinal vein occlusion; CRAO central retinal artery occlusion; BRAO branch retinal artery occlusion; RT retinal tear; SB scleral buckle; PPV pars plana vitrectomy; VH Vitreous hemorrhage; PRP panretinal laser photocoagulation; IVK intravitreal kenalog; VMT vitreomacular traction; MH Macular hole;  NVD neovascularization of the disc; NVE neovascularization elsewhere; AREDS age related eye disease study; ARMD age related macular degeneration; POAG primary open angle glaucoma; EBMD epithelial/anterior basement membrane dystrophy; ACIOL anterior chamber intraocular lens; IOL intraocular lens; PCIOL posterior chamber intraocular  lens; Phaco/IOL phacoemulsification with intraocular lens placement; [place] photorefractive keratectomy; LASIK laser assisted in situ keratomileusis; HTN hypertension; DM diabetes mellitus; COPD chronic obstructive pulmonary disease

## 2021-12-18 ENCOUNTER — Encounter (INDEPENDENT_AMBULATORY_CARE_PROVIDER_SITE_OTHER): Payer: Self-pay | Admitting: Ophthalmology

## 2021-12-18 ENCOUNTER — Ambulatory Visit (INDEPENDENT_AMBULATORY_CARE_PROVIDER_SITE_OTHER): Payer: Medicare HMO | Admitting: Ophthalmology

## 2021-12-18 DIAGNOSIS — Z79899 Other long term (current) drug therapy: Secondary | ICD-10-CM

## 2021-12-18 DIAGNOSIS — H353122 Nonexudative age-related macular degeneration, left eye, intermediate dry stage: Secondary | ICD-10-CM

## 2021-12-18 DIAGNOSIS — H401131 Primary open-angle glaucoma, bilateral, mild stage: Secondary | ICD-10-CM

## 2021-12-18 DIAGNOSIS — H04123 Dry eye syndrome of bilateral lacrimal glands: Secondary | ICD-10-CM

## 2021-12-18 DIAGNOSIS — H353211 Exudative age-related macular degeneration, right eye, with active choroidal neovascularization: Secondary | ICD-10-CM | POA: Diagnosis not present

## 2021-12-18 DIAGNOSIS — Z961 Presence of intraocular lens: Secondary | ICD-10-CM

## 2021-12-18 DIAGNOSIS — D4989 Neoplasm of unspecified behavior of other specified sites: Secondary | ICD-10-CM

## 2021-12-18 MED ORDER — AFLIBERCEPT 2MG/0.05ML IZ SOLN FOR KALEIDOSCOPE
2.0000 mg | INTRAVITREAL | Status: AC | PRN
  Administered 2021-12-18: 2 mg via INTRAVITREAL

## 2021-12-25 ENCOUNTER — Other Ambulatory Visit: Payer: Self-pay

## 2021-12-25 NOTE — Telephone Encounter (Signed)
Requesting: lyrica Contract: UDS: Last Visit:06/05/21 Next Visit:24 week f/u Last Refill:06/05/21 90,0)  Please Advise

## 2021-12-31 ENCOUNTER — Encounter: Payer: Self-pay | Admitting: Family Medicine

## 2021-12-31 ENCOUNTER — Ambulatory Visit: Payer: Medicare HMO | Admitting: Family Medicine

## 2021-12-31 VITALS — BP 127/78 | HR 71 | Temp 97.9°F | Wt 133.2 lb

## 2021-12-31 DIAGNOSIS — L821 Other seborrheic keratosis: Secondary | ICD-10-CM | POA: Diagnosis not present

## 2021-12-31 DIAGNOSIS — M359 Systemic involvement of connective tissue, unspecified: Secondary | ICD-10-CM

## 2021-12-31 DIAGNOSIS — G4762 Sleep related leg cramps: Secondary | ICD-10-CM | POA: Diagnosis not present

## 2021-12-31 DIAGNOSIS — R32 Unspecified urinary incontinence: Secondary | ICD-10-CM

## 2021-12-31 DIAGNOSIS — R011 Cardiac murmur, unspecified: Secondary | ICD-10-CM

## 2021-12-31 DIAGNOSIS — M353 Polymyalgia rheumatica: Secondary | ICD-10-CM

## 2021-12-31 DIAGNOSIS — E559 Vitamin D deficiency, unspecified: Secondary | ICD-10-CM

## 2021-12-31 DIAGNOSIS — L989 Disorder of the skin and subcutaneous tissue, unspecified: Secondary | ICD-10-CM

## 2021-12-31 DIAGNOSIS — E034 Atrophy of thyroid (acquired): Secondary | ICD-10-CM

## 2021-12-31 DIAGNOSIS — L700 Acne vulgaris: Secondary | ICD-10-CM

## 2021-12-31 DIAGNOSIS — M0600 Rheumatoid arthritis without rheumatoid factor, unspecified site: Secondary | ICD-10-CM

## 2021-12-31 DIAGNOSIS — J302 Other seasonal allergic rhinitis: Secondary | ICD-10-CM | POA: Diagnosis not present

## 2021-12-31 DIAGNOSIS — I739 Peripheral vascular disease, unspecified: Secondary | ICD-10-CM

## 2021-12-31 HISTORY — DX: Disorder of the skin and subcutaneous tissue, unspecified: L98.9

## 2021-12-31 MED ORDER — MONTELUKAST SODIUM 10 MG PO TABS: 10.0000 mg | ORAL_TABLET | Freq: Every day | ORAL | 1 refills | Status: DC

## 2021-12-31 MED ORDER — PREGABALIN 25 MG PO CAPS: 25.0000 mg | ORAL_CAPSULE | Freq: Every day | ORAL | 1 refills | Status: DC

## 2021-12-31 NOTE — Patient Instructions (Addendum)
Return in about 24 weeks (around 06/17/2022) for Routine chronic condition follow-up.        Great to see you today.  I have refilled the medication(s) we provide.   If labs were collected, we will inform you of lab results once received either by echart message or telephone call.   - echart message- for normal results that have been seen by the patient already.   - telephone call: abnormal results or if patient has not viewed results in their echart.

## 2021-12-31 NOTE — Progress Notes (Signed)
Margaret [name] , 18-Oct-1929, 88 y.o., female MRN: [id number] Patient Care Team    Relationship Specialty Notifications Start End  Ma Hillock, DO PCP - General Family Medicine  03/08/19   Bernarda Caffey, MD Consulting Physician Ophthalmology  03/29/18   Konrad Felix, MD Consulting Physician Ophthalmology  03/29/18   Devra Dopp, MD Referring Physician Dermatology  08/02/19   Valinda Party, MD  Rheumatology  08/02/19     Chief Complaint  Patient presents with   restless leg     Subjective: Pt presents for an OV for follow up in [facility] PMR/RA: Patient reports she is much improved from when she last saw provider.  She is established with rheumatology.  She is on low-dose prednisone**as needed, and had been prescribed MTX and  Plaquenil at different times in the past. Prior note: Patient presents today to discuss her arthralgias and myalgias.  Symptoms are worsening.  She reports increased symptoms in her left shoulder her left hand and her left hip.  Her symptoms are bilateral, worse on the left side. Her CMP, CRP, ESR, TSH, CBC and Lyme titers were all normal. Her ANA was positive, 1:640 nuclear homogenous. CCP and RF normal.  Hypothyroidism, unspecified type Patient reports compliance with levothyroxine 50 mcg daily.    Nocturnal leg cramps/RLS Patient reports the leg cramps have improved with the use of Lyrica..  Would like to continue this medication if she feels it is working well Prior note: Patient reports he had forgotten about the Mirapex she had been on.  She would like to restart this medication.  She has been prescribed Mirapex 0.25 mg nightly by her prior PCP for nocturnal leg cramping.  She feels this has worked well for her. Vitamin D insufficiency: pt is supplementing.   Seasonal allergies/Mild intermittent asthma, unspecified whether complicated Patient reports she has asthma and allergies.    Patient reports compliance with Singulair and her symptoms  are well controlled.  She also uses Flonase and albuterol as needed.    Urinary incontinence: Patient reports she has had urinary incontinence for many years.  She experiences incontinence when coughing, sneezing, laughing and she also has features of not being able to make it to the bathroom on time.  She feels this is getting worse over the last few months and is getting embarrassing to her.  Skin lesions: She reports she has 3 skin lesions she would like evaluated.  Her 2 small skin lesions on her neck she states have been present for many years and are just rough and irritating.  The third skin lesion is above her left eyebrow and she noticed over the summertime.  She states it is almost as completely healed and then it started to become raised red and uncomfortable again.    10/24/2021    9:10 AM 12/09/2020    9:53 AM 10/22/2020    1:13 PM 03/19/2020   11:32 AM 03/29/2018   10:20 AM  Depression screen PHQ 2/9  Decreased Interest 0 0 0  0  Down, Depressed, Hopeless 0 0 0 0 0  PHQ - 2 Score 0 0 0 0 0    Allergies  Allergen Reactions   Augmentin [Amoxicillin-Pot Clavulanate] Swelling and Rash    Swelling in face   Lactose Intolerance (Gi)    Social History   Social History Narrative   Marital status/children/pets: Widowed.  Moved to [place] from [place] area) 2019.  She shares a home  with 1 of her daughters.  Has 1 son and 2 daughters   Education/employment: Buyer, retail of arts degree, retired Marine scientist:      -smoke alarm in the home:Yes     - wears seatbelt: Yes     - Feels safe in their relationships: Yes      2 caffeinated beverages a day no alcohol tobacco or drug use   Past Medical History:  Diagnosis Date   Allergy    Arthritis of left knee    Asthma    Carotid artery stenosis 10/17/2012   stable report in 2018- "moderate on right, minimal on left"   Carpal tunnel syndrome 2018   left.   Cervical radiculopathy 05/16/2015   DDD C4-C5, C5-C6, and  C6-C7.  Facet arthropathy throughout cervical spine.  Normal foraminal narrowing secondary to uncovertebral  arthropathy is seen bilaterally at C3-C4 and C5/C6   Chicken pox    DDD (degenerative disc disease), lumbar 04/26/2015   Mild levoscoliosis, right anterior listhesis of L4 and L5.  DDD at every level.  Facet arthropathy at multiple levels.   Deviated septum    Glaucoma    both eyes, mild opened angle   Heart murmur    History of colon polyps    History of fall    History of frequent urinary tract infections    klebs- pansensitive. c. 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    Pseudophakia of both eyes    PVD (peripheral vascular disease) ([place])    Rheumatic fever    88 years old    Syncope 05/17/2017   Thyroid disease    Urinary incontinence    Vertigo    had been prescribed antivert   Vitamin D deficiency    Past Surgical History:  Procedure Laterality Date   CARPAL TUNNEL RELEASE Bilateral 2019   CATARACT EXTRACTION Bilateral 1997   COLONOSCOPY  2010   NASAL SEPTUM SURGERY  2007   WRIST FRACTURE SURGERY Right 1996   Family History  Problem Relation Age of Onset   Arthritis Mother    COPD Mother    Prostate cancer Father    Arthritis Father    Hearing loss Father    Heart disease Father    Heart attack Father    Stroke Maternal Grandmother    Liver cancer Sister    Arthritis Brother    Prostate cancer Brother    COPD Brother    Heart disease Brother    Breast cancer Daughter    Blindness Maternal Grandfather    Stroke Maternal Grandfather    COPD Paternal Grandmother    Breast cancer Paternal Grandmother    Allergies as of  12/31/2021       Reactions   Augmentin [amoxicillin-pot Clavulanate] Swelling, Rash   Swelling in face   Lactose Intolerance (gi)         Medication List        Accurate as of December 31, 2021  2:16 PM. If you have any questions, ask your nurse or doctor.  albuterol 108 (90 Base) MCG/ACT inhaler Commonly known as: VENTOLIN HFA Inhale 2 puffs into the lungs every 6 (six) hours as needed for wheezing or shortness of breath.   CALCIUM PO Take 1 tablet by mouth 2 (two) times daily. Slow release   cycloSPORINE 0.05 % ophthalmic emulsion Commonly known as: RESTASIS 1 drop 2 (two) times daily.   folic acid 0.5 MG tablet Commonly known as: FOLVITE Take 0.5 mg by mouth daily.   lactase 3000 units tablet Commonly known as: LACTAID Take 2 tablets (6,000 Units total) by mouth 3 (three) times daily as needed (lactose intolerance).   levothyroxine 50 MCG tablet Commonly known as: SYNTHROID Take 1 tablet (50 mcg total) by mouth daily before breakfast.   montelukast 10 MG tablet Commonly known as: SINGULAIR Take 1 tablet (10 mg total) by mouth at bedtime.   polyvinyl alcohol 1.4 % ophthalmic solution Commonly known as: LIQUIFILM TEARS Place 1 drop into both eyes as needed for dry eyes.   predniSONE 5 MG tablet Commonly known as: DELTASONE Take 5 mg by mouth daily as needed (joint/muscle pain).   pregabalin 25 MG capsule Commonly known as: Lyrica Take 1 capsule (25 mg total) by mouth at bedtime.   PreserVision AREDS 2 Caps Take 1 capsule by mouth 2 (two) times daily.   Prolia 60 MG/ML Sosy injection Generic drug: denosumab as directed Subcutaneous        All past medical history, surgical history, allergies, family history, immunizations andmedications were updated in the EMR today and reviewed under the history and medication portions of their EMR.     ROS: Negative, with the exception of above mentioned in HPI  Objective:  BP 127/78   Pulse 71    Temp 97.9 F (36.6 C)   Wt 133 lb 3.2 oz (60.4 kg)   SpO2 96%   BMI 26.01 kg/m  Body mass index is 26.01 kg/m. Physical Exam Vitals and nursing note reviewed.  Constitutional:      General: She is not in acute distress.    Appearance: Normal appearance. She is not ill-appearing, toxic-appearing or diaphoretic.  HENT:     Head: Normocephalic and atraumatic.  Eyes:     General: No scleral icterus.       Right eye: No discharge.        Left eye: No discharge.     Extraocular Movements: Extraocular movements intact.     Conjunctiva/sclera: Conjunctivae normal.     Pupils: Pupils are equal, round, and reactive to light.  Cardiovascular:     Rate and Rhythm: Normal rate and regular rhythm.  Pulmonary:     Effort: Pulmonary effort is normal. No respiratory distress.     Breath sounds: Normal breath sounds. No wheezing, rhonchi or rales.  Musculoskeletal:     Right lower leg: No edema.     Left lower leg: No edema.  Skin:    General: Skin is warm and dry.     Coloration: Skin is not jaundiced or pale.     Findings: Lesion present. No erythema or rash.     Comments: x1 2 mm raised red scaly open comedone. X2 stuck on plaque like 1 mm lesions left-sided neck.  Neurological:     Mental Status: She is alert and oriented to person, place, and time. Mental status is at baseline.     Motor: No weakness.     Gait: Gait normal.  Psychiatric:        Mood and Affect: Mood  normal.        Behavior: Behavior normal.        Thought Content: Thought content normal.        Judgment: Judgment normal.     No results found. No results found. No results found for this or any previous visit (from the past 24 hour(s)).   Assessment/Plan: Margaret [name] is a 88 y.o. female present for OV for  Hypothyroidism, unspecified type Continue levothyroxine 50 mcg daily.  Vit d def/osteoporosis:  prolia injections are provided by rheumatology.  Vit D, PTH and calcium levels  UTD  Rheumatoid  arthritis/PMR: - Now established with rheumatology  Continue routine follow-ups with rheumatology  Nocturnal leg cramps/RLS/PVD Stable Continue Lyrica nightly Failed:  Mirapex. [place] controlled substance database reviewed today   Seasonal allergies/Mild intermittent asthma, unspecified whether complicated Stable Continue Singulair  Continue albuterol as needed  -Continue Flonase   Urinary incontinence in female Problem today. We discussed different options as far as treatments for urinary incontinence.  We discussed different types of incontinence. Incontinence is worsening for her and becoming more embarrassing over the last few months. - Ambulatory referral to Gynecology  Murmur, cardiac history of mitral valve prolapse He has had a history of mitral valve prolapse since she was a child.  However today's exam murmur sounded louder/more pronounced than in the past.  She has not had cardiology evaluation or any recent echocardiograms.  She is agreeable to have echocardiogram completed. - ECHOCARDIOGRAM COMPLETE; Future  skin lesion X1 open comedone-irritated above left brow.  Comedone extracted and cryo. X2 SK left lateral neck, about 1-2 mm each, cryotherapy applied today. Patient tolerated procedure well.  She will place BB ointment over until completely healed.  Reviewed expectations re: course of current medical issues. Discussed self-management of symptoms. Outlined signs and symptoms indicating need for more acute intervention. Patient verbalized understanding and all questions were answered. Patient received an After-Visit Summary.    Orders Placed This Encounter  Procedures   Ambulatory referral to Gynecology   ECHOCARDIOGRAM COMPLETE    Meds ordered this encounter  Medications   montelukast (SINGULAIR) 10 MG tablet    Sig: Take 1 tablet (10 mg total) by mouth at bedtime.    Dispense:  90 tablet    Refill:  1   pregabalin (LYRICA) 25 MG capsule     Sig: Take 1 capsule (25 mg total) by mouth at bedtime.    Dispense:  90 capsule    Refill:  1    Referral Orders         Ambulatory referral to Gynecology      42 minutes was dedicated to this patient's encounter to include  face-to-face time with patient multiple chronic conditions, medication management, cryotherapy for skin lesions and discussion of treatment plan for new conditions.  Note is dictated utilizing voice recognition software. Although note has been proof read prior to signing, occasional typographical errors still can be missed. If any questions arise, please do not hesitate to call for verification.   electronically signed by:  Howard Pouch, DO  [place]

## 2022-01-06 ENCOUNTER — Encounter (HOSPITAL_BASED_OUTPATIENT_CLINIC_OR_DEPARTMENT_OTHER): Payer: Self-pay | Admitting: *Deleted

## 2022-01-06 ENCOUNTER — Telehealth (HOSPITAL_BASED_OUTPATIENT_CLINIC_OR_DEPARTMENT_OTHER): Payer: Self-pay | Admitting: *Deleted

## 2022-01-06 NOTE — Telephone Encounter (Signed)
Called to discuss scheduling the Echocardiogram ordered by Howard Pouch, DO-phone number not in service--will mail letter requesting patient reach out to our office to schedule

## 2022-01-08 NOTE — Progress Notes (Signed)
[facility] Note  01/22/2022    CHIEF COMPLAINT Patient presents for Retina Follow Up   HISTORY OF PRESENT ILLNESS: Margaret [name] is a 88 y.o. female who presents to the clinic today for:   HPI     Retina Follow Up   Patient presents with  Wet AMD.  In right eye.  This started 4 weeks ago.  Duration of 4 weeks.  I, the attending physician,  performed the HPI with the patient and updated documentation appropriately.        Comments   4-5 week retina follow up ARMD and IVE OD pt states she has noticed her OD vision things look wavy when looking at things that are straight OS seems to be OK       Last edited by Bernarda Caffey, MD on 01/22/2022  3:07 PM.    Patient states   Referring physician: Ma Hillock, DO [address] [place],  [place] [id number]  HISTORICAL INFORMATION:  Selected notes from the MEDICAL RECORD NUMBER Self referral for macular degeneration LEE: 03.21.19 (Dr. Park Liter in [place], [place]) [BCVA: OD: 20/40 OS: 20/40] Ocular Hx-POAG, non-exu ARMD, DES, pseudo, YAG PMH-astham, arthritis,    CURRENT MEDICATIONS: Current Outpatient Medications (Ophthalmic Drugs)  Medication Sig   cycloSPORINE (RESTASIS) 0.05 % ophthalmic emulsion 1 drop 2 (two) times daily.   polyvinyl alcohol (LIQUIFILM TEARS) 1.4 % ophthalmic solution Place 1 drop into both eyes as needed for dry eyes.   No current facility-administered medications for this visit. (Ophthalmic Drugs)   Current Outpatient Medications (Other)  Medication Sig   albuterol (VENTOLIN HFA) 108 (90 Base) MCG/ACT inhaler Inhale 2 puffs into the lungs every 6 (six) hours as needed for wheezing or shortness of breath.   CALCIUM PO Take 1 tablet by mouth 2 (two) times daily. Slow release   denosumab (PROLIA) 60 MG/ML SOSY injection as directed Subcutaneous   folic acid (FOLVITE) 0.5 MG tablet Take 0.5 mg by mouth daily.   lactase (LACTAID) 3000 units tablet Take 2 tablets (6,000 Units total) by  mouth 3 (three) times daily as needed (lactose intolerance).   levothyroxine (SYNTHROID) 50 MCG tablet Take 1 tablet (50 mcg total) by mouth daily before breakfast.   montelukast (SINGULAIR) 10 MG tablet Take 1 tablet (10 mg total) by mouth at bedtime.   Multiple Vitamins-Minerals (PRESERVISION AREDS 2) CAPS Take 1 capsule by mouth 2 (two) times daily.   predniSONE (DELTASONE) 5 MG tablet Take 5 mg by mouth daily as needed (joint/muscle pain).   pregabalin (LYRICA) 25 MG capsule Take 1 capsule (25 mg total) by mouth at bedtime.   No current facility-administered medications for this visit. (Other)   REVIEW OF SYSTEMS:   ALLERGIES Allergies  Allergen Reactions   Augmentin [Amoxicillin-Pot Clavulanate] Swelling and Rash    Swelling in face   Lactose Intolerance (Gi)    PAST MEDICAL HISTORY Past Medical History:  Diagnosis Date   Allergy    Arthritis of left knee    Asthma    Carotid artery stenosis 10/17/2012   stable report in 2018- "moderate on right, minimal on left"   Carpal tunnel syndrome 2018   left.   Cervical radiculopathy 05/16/2015   DDD C4-C5, C5-C6, and C6-C7.  Facet arthropathy throughout cervical spine.  Normal foraminal narrowing secondary to uncovertebral  arthropathy is seen bilaterally at C3-C4 and C5/C6   Chicken pox    DDD (degenerative disc disease), lumbar 04/26/2015   Mild levoscoliosis,  right anterior listhesis of L4 and L5.  DDD at every level.  Facet arthropathy at multiple levels.   Deviated septum    Glaucoma    both eyes, mild opened angle   Heart murmur    History of colon polyps    History of fall    History of frequent urinary tract infections    klebs- pansensitive. c. freundii - resitant to cefazolin and augementin   Hypothyroidism    Intra-abdominal abscess ([place]) 03/10/2020   Iron deficiency anemia    prior pcp told her to take Fe 325 QD   Macular degeneration    h/o retinal edema   Mitral valve prolapse 08/27/2011   Murmur, cardiac  10/17/2012   Osteoarthritis    Osteoporosis    Pneumonia    Positive TB test    In college    Pseudophakia of both eyes    PVD (peripheral vascular disease) ([place])    Rheumatic fever    88 years old    Syncope 05/17/2017   Thyroid disease    Urinary incontinence    Vertigo    had been prescribed antivert   Vitamin D deficiency    Past Surgical History:  Procedure Laterality Date   CARPAL TUNNEL RELEASE Bilateral 2019   CATARACT EXTRACTION Bilateral 1997   COLONOSCOPY  2010   NASAL SEPTUM SURGERY  2007   WRIST FRACTURE SURGERY Right 1996   FAMILY HISTORY Family History  Problem Relation Age of Onset   Arthritis Mother    COPD Mother    Prostate cancer Father    Arthritis Father    Hearing loss Father    Heart disease Father    Heart attack Father    Stroke Maternal Grandmother    Liver cancer Sister    Arthritis Brother    Prostate cancer Brother    COPD Brother    Heart disease Brother    Breast cancer Daughter    Blindness Maternal Grandfather    Stroke Maternal Grandfather    COPD Paternal Grandmother    Breast cancer Paternal Grandmother    SOCIAL HISTORY Social History   Tobacco Use   Smoking status: Former    Packs/day: 0.50    Types: Cigarettes    Start date: 1952    Quit date: 1965    Years since quitting: 58.9   Smokeless tobacco: Never  Vaping Use   Vaping Use: Never used  Substance Use Topics   Alcohol use: Not Currently    Alcohol/week: 5.0 standard drinks of alcohol    Types: 5 Standard drinks or equivalent per week   Drug use: Never       OPHTHALMIC EXAM:  Base Eye Exam     Visual Acuity (Snellen - Linear)       Right Left   Dist cc 20/40 -2 20/30 -1   Dist ph cc NI NI    Correction: Glasses         Tonometry (Tonopen, 1:21 PM)       Right Left   Pressure 13 15         Pupils       Pupils Dark Light Shape React APD   Right PERRL 3 2 Round Brisk None   Left PERRL 3 2 Round Brisk None         Visual Fields        Left Right    Full Full         Extraocular Movement  Right Left    Full, Ortho Full, Ortho         Neuro/Psych     Oriented x3: Yes   Mood/Affect: Normal         Dilation     Both eyes: 2.5% Phenylephrine @ 1:21 PM           Slit Lamp and Fundus Exam     External Exam       Right Left   External Normal Normal         Slit Lamp Exam       Right Left   Lids/Lashes Dermatochalasis - upper lid, Telangiectasia Dermatochalasis - upper lid, mild Meibomian gland dysfunction   Conjunctiva/Sclera Temporal Pinguecula White and quiet   Cornea Arcus, 1+ Punctate epithelial erosions, mild tear film debris, fine endo pigment Arcus, 1-2+ inferior Punctate epithelial erosions, mild tear film debris, fine endo pigment   Anterior Chamber Deep and quiet, narrow temporal angle Deep and quiet   Iris Round and dilated Round and dilated, pigmented mass at 0200 and 0730 behind iris, anterior to IOL   Lens PC IOL in good position with open PC PC IOL in good position with open PC   Anterior Vitreous Vitreous syneresis Vitreous syneresis, Posterior vitreous detachment         Fundus Exam       Right Left   Disc 360 Peripapillary atrophy, Sharp rim, 2+ Pallor sharp rim, temporal PPA, mild Pallor   C/D Ratio 0.4 0.3   Macula Flat, Blunted foveal reflex, drusen, RPE mottling and clumping, +central IRF / cystic changes -- slightly improved, punctate IRH -- stably resolved, no SRH, early RPE atrophy Flat, Blunted foveal reflex, drusen, RPE mottling and clumping, no heme or edema   Vessels attenuated, Tortuous attenuated, Tortuous   Periphery Attached, scattered Reticular degeneration, pigmented cystoid degeneration temporally, No heme Attached, scattered Reticular degeneration, No heme           Refraction     Wearing Rx       Sphere Cylinder Axis Add   Right -0.75 +2.25 175 +2.75   Left -0.25 +1.25 165 +2.75           IMAGING AND PROCEDURES  Imaging and  Procedures for '@TODAY'$ @  OCT, Retina - OU - Both Eyes       Right Eye Quality was good. Central Foveal Thickness: 322. Progression has improved. Findings include no SRF, abnormal foveal contour, retinal drusen , intraretinal hyper-reflective material, epiretinal membrane, intraretinal fluid, outer retinal atrophy (Interval improvement in central IRF / cystic changes, Partial PVD).   Left Eye Quality was good. Central Foveal Thickness: 300. Progression has been stable. Findings include normal foveal contour, no IRF, no SRF, retinal drusen , outer retinal atrophy (Trace ERM, Partial PVD).   Notes *Images captured and stored on drive  Diagnosis / Impression:  OD: Exudative ARMD - interval improvement in central IRF / cystic changes, Partial PVD OS: Nonexudative ARMD OS Partial PVD OU  Clinical management:  See below  Abbreviations: NFP - Normal foveal profile. CME - cystoid macular edema. PED - pigment epithelial detachment. IRF - intraretinal fluid. SRF - subretinal fluid. EZ - ellipsoid zone. ERM - epiretinal membrane. ORA - outer retinal atrophy. ORT - outer retinal tubulation. SRHM - subretinal hyper-reflective material       Intravitreal Injection, Pharmacologic Agent - OD - Right Eye       Time Out 01/22/2022. 2:06 PM. Confirmed correct patient, procedure, site, and patient consented.  Anesthesia Topical anesthesia was used. Anesthetic medications included Lidocaine 2%, Proparacaine 0.5%.   Procedure Preparation included 5% betadine to ocular surface, eyelid speculum. A (32g) needle was used.   Injection: 6 mg faricimab-svoa 6 MG/0.05ML   Route: Intravitreal, Site: Right Eye   NDC: [id number], Lot: [id number], Expiration date: 01/22/2024, Waste: 0 mL   Post-op Post injection exam found visual acuity of at least counting fingers. The patient tolerated the procedure well. There were no complications. The patient received written and verbal post procedure care education.  Post injection medications were not given.             ASSESSMENT/PLAN:    ICD-10-CM   1. Exudative age-related macular degeneration of right eye with active choroidal neovascularization (HCC)  H35.3211 OCT, Retina - OU - Both Eyes    Intravitreal Injection, Pharmacologic Agent - OD - Right Eye    faricimab-svoa (VABYSMO) '6mg'$ /0.47m intravitreal injection    2. 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 Long-term use of Plaquenil  Z79.899     5. Pseudophakia of both eyes  Z96.1     6. Primary open angle glaucoma (POAG) of both eyes, mild stage  H40.1131     7. Dry eyes  H04.123      1. Exudative age related macular degeneration, OD  - conversion from nonexudative ARMD noted on 9.23.22 visit  - s/p IVA OD #1 (09.23.22), #2 (10.21.22), #3 (11.18.22), #4 (12.30.22), #5 (02.13.23), #6 (03.24.23), #7 (05.08.23), #8 (06.09.23) -- IVA resistance  - s/p IVE OD #1 (07.24.23), #2 (08.25.23), #3 (09.29.23), #4 (10.27.23) - BCVA at 20/40 -- stable - OCT shows OD: interval improvement in central IRF / cystic changes at 5 weeks - discussed IVE resistance and possible switch in medication - recommend switching to Vabysmo OD #1 today, 12.01.23 w/ f/u in 5 wks - pt wishes to be treated with IVV - RBA of procedure discussed, questions answered - Eylea informed consent obtained and signed, 07.24.23 - Avastin informed consent obtained and signed, 09.23.22 - Vabysmo informed consent obtained and signed, 12.01.23 - see procedure note - f/u 5 weeks -- DFE/OCT, possible injection   2. Age related macular degeneration, non-exudative OS  - intermediate stage-no change from previous visit - The incidence, anatomy, and pathology of dry AMD, risk of progression, and the AREDS and AREDS 2 study including smoking risks discussed with patient.   - recommend Amsler grid monitoring  3. Iris/ciliary body mass OS - pigmented  lesion behind iris but anterior to PCIOL at 0730 and 0300 - 0730 lesion spans 0630 to 0830 behind dilated iris and larger than 0300 lesion  - patient asymptomatic - pt [facility] Dr. [facility]ow [facility] on 11.01.22 who dx her with iris margin cyst  4. Plaquenil use for RA  - baseline testing done 05.20.20 -- started plaquenil early May 2020 f or RA - HVF 10-2 showed non-specific defects OU -- essentially normal baseline test - OCT shows no obvious plaquenil-related changes, but interval development of exudative ARMD OD as above  - currently taking '200mg'$  / day per Dr. SAudelia Hivesinstructions --> 3.29 mg/kg body wt per day  - po prednisone, currently as needed which is about once a week - and is also now on '10mg'$  po MTX weekly + folic acid - the American Academy of Ophthalmology recommends dosing '5mg'$ /kg per day to reduce risk of retinal toxicity  -  recommend consideration of using alternate medication for RA especially given co-morbidity of age-related macular degeneration OU  - managed [facility] Dossie Der [facility]  5. Pseudophakia OU - s/p CE/IOL w/ ?canaloplasty OU by Dr. Robyne As[facility]w at [facility]  - doing well  - continue to monitor  6. History of Glaucoma   - not currently on gtts -- IOP 13,14 - ?underwent canaloplasty in conjunction with CEIOL at [facility]   - referred to and now under the expert care of Dr. Kathlen Mody -- initial v V isit/consult 10.3.2019  7. Dry Eye Syndrome  - use AT's OU as needed  Ophthalmic Meds Ordered this visit:  Meds ordered this encounter  Medications   faricimab-svoa (VABYSMO) '6mg'$ /0.45m intravitreal injection     Return in 5 weeks (on 02/26/2022) for f/u Ex. AMD OD , DFE, OCT, .  There are no Patient Instructions on file for this visit.  Explained the diagnoses, plan, and follow up with the patient and they expressed understanding.  Patient expressed understanding of the importance of proper follow up care.   This  document serves as a record of services personally performed by BGardiner Sleeper MD, PhD. It was created on their behalf by ASan Jetty BOwens Shark OA an ophthalmic technician. The creation of this record is the provider's dictation and/or activities during the visit.    Electronically signed by: ASan Jetty BOwens Shark ONew [place]11.17.2023 3:11 PM  BGardiner Sleeper M.D., Ph.D. Diseases & Surgery of the Retina and Vitreous [facility] I have reviewed the above documentation for accuracy and completeness, and I agree with the above. BGardiner Sleeper M.D., Ph.D. 01/22/22 3:11 PM  Abbreviations: M myopia (nearsighted); A astigmatism; H hyperopia (farsighted); P presbyopia; Mrx spectacle prescription;  CTL contact lenses; OD right eye; OS left eye; OU both eyes  XT exotropia; ET esotropia; PEK punctate epithelial keratitis; PEE punctate epithelial erosions; DES dry eye syndrome; MGD meibomian gland dysfunction; ATs artificial tears; PFAT's preservative free artificial tears; [place] sclerotic cataract; PSC posterior subcapsular cataract; ERM epi-retinal membrane; PVD posterior vitreous detachment; RD retinal detachment; DM diabetes mellitus; DR diabetic retinopathy; NPDR non-proliferative diabetic retinopathy; PDR proliferative diabetic retinopathy; CSME clinically significant macular edema; DME diabetic macular edema; dbh dot blot hemorrhages; CWS cotton wool spot; POAG primary open angle glaucoma; C/D cup-to-disc ratio; HVF humphrey visual field; GVF goldmann visual field; OCT optical coherence tomography; IOP intraocular pressure; BRVO Branch retinal vein occlusion; CRVO central retinal vein occlusion; CRAO central retinal artery occlusion; BRAO branch retinal artery occlusion; RT retinal tear; SB scleral buckle; PPV pars plana vitrectomy; VH Vitreous hemorrhage; PRP panretinal laser photocoagulation; IVK intravitreal kenalog; VMT vitreomacular traction; MH Macular hole;  NVD neovascularization of  the disc; NVE neovascularization elsewhere; AREDS age related eye disease study; ARMD age related macular degeneration; POAG primary open angle glaucoma; EBMD epithelial/anterior basement membrane dystrophy; ACIOL anterior chamber intraocular lens; IOL intraocular lens; PCIOL posterior chamber intraocular lens; Phaco/IOL phacoemulsification with intraocular lens placement; POxbow Estatesphotorefractive keratectomy; LASIK laser assisted in situ keratomileusis; HTN hypertension; DM diabetes mellitus; COPD chronic obstructive pulmonary disease

## 2022-01-12 ENCOUNTER — Telehealth: Payer: Self-pay

## 2022-01-12 DIAGNOSIS — R32 Unspecified urinary incontinence: Secondary | ICD-10-CM

## 2022-01-12 NOTE — Telephone Encounter (Signed)
Order changed with Jan expiration date

## 2022-01-12 NOTE — Telephone Encounter (Signed)
Apparently, that particular gynecology team does not manage female urinary incontinence.  Therefore it was denied. She has features mostly of stress incontinence and was hoping to be managed by gynecology. Since she has Medicare, this may be playing a role in getting her into see gynecology.  Please inform patient I have referred her to a urogynecologist.

## 2022-01-12 NOTE — Telephone Encounter (Signed)
Patient calling to say that she needs an extension on ECHO because it cannot be scheduled until 12/14 at [facility]. I am not sure exactly what she is requesting for [place] to do.  Please advise.  Patient can be reached at [phone]

## 2022-01-12 NOTE — Addendum Note (Signed)
Addended by: Howard Pouch A on: 01/12/2022 11:48 AM   Modules accepted: Orders

## 2022-01-12 NOTE — Telephone Encounter (Signed)
Ok. Please place order again with expiration date for January.

## 2022-01-12 NOTE — Telephone Encounter (Signed)
Spoke with patient regarding results/recommendations.  

## 2022-01-12 NOTE — Addendum Note (Signed)
Addended by: [name] on: 01/12/2022 11:22 AM   Modules accepted: Orders

## 2022-01-22 ENCOUNTER — Ambulatory Visit (INDEPENDENT_AMBULATORY_CARE_PROVIDER_SITE_OTHER): Payer: Medicare HMO | Admitting: Ophthalmology

## 2022-01-22 ENCOUNTER — Encounter (INDEPENDENT_AMBULATORY_CARE_PROVIDER_SITE_OTHER): Payer: Self-pay | Admitting: Ophthalmology

## 2022-01-22 DIAGNOSIS — H353122 Nonexudative age-related macular degeneration, left eye, intermediate dry stage: Secondary | ICD-10-CM | POA: Diagnosis not present

## 2022-01-22 DIAGNOSIS — H401131 Primary open-angle glaucoma, bilateral, mild stage: Secondary | ICD-10-CM

## 2022-01-22 DIAGNOSIS — Z79899 Other long term (current) drug therapy: Secondary | ICD-10-CM

## 2022-01-22 DIAGNOSIS — H04123 Dry eye syndrome of bilateral lacrimal glands: Secondary | ICD-10-CM

## 2022-01-22 DIAGNOSIS — H353211 Exudative age-related macular degeneration, right eye, with active choroidal neovascularization: Secondary | ICD-10-CM

## 2022-01-22 DIAGNOSIS — Z961 Presence of intraocular lens: Secondary | ICD-10-CM

## 2022-01-22 DIAGNOSIS — D4989 Neoplasm of unspecified behavior of other specified sites: Secondary | ICD-10-CM

## 2022-01-22 MED ORDER — FARICIMAB-SVOA 6 MG/0.05ML IZ SOLN
6.0000 mg | INTRAVITREAL | Status: AC | PRN
  Administered 2022-01-22: 6 mg via INTRAVITREAL

## 2022-02-04 ENCOUNTER — Ambulatory Visit (INDEPENDENT_AMBULATORY_CARE_PROVIDER_SITE_OTHER): Payer: Medicare HMO

## 2022-02-04 DIAGNOSIS — R011 Cardiac murmur, unspecified: Secondary | ICD-10-CM | POA: Diagnosis not present

## 2022-02-04 LAB — ECHOCARDIOGRAM COMPLETE
AR max vel: 0.73 cm2
AV Area VTI: 0.83 cm2
AV Area mean vel: 0.68 cm2
AV Mean grad: 26 mmHg
AV Peak grad: 46 mmHg
Ao pk vel: 3.39 m/s
Area-P 1/2: 2.29 cm2
MV M vel: 2.4 m/s
MV Peak grad: 23 mmHg
MV VTI: 1.39 cm2
S' Lateral: 2.03 cm

## 2022-02-05 ENCOUNTER — Telehealth: Payer: Self-pay | Admitting: Family Medicine

## 2022-02-05 DIAGNOSIS — I050 Rheumatic mitral stenosis: Secondary | ICD-10-CM

## 2022-02-05 DIAGNOSIS — I350 Nonrheumatic aortic (valve) stenosis: Secondary | ICD-10-CM

## 2022-02-05 NOTE — Telephone Encounter (Signed)
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  This is causing the murmur that I was hearing.  I have placed a referral to cardiology for her.  They should be reaching out to get her scheduled shortly to evaluate and discuss any need for intervention.  Symptoms of aortic stenosis may include: Chest pain Rapid, fluttering heartbeat Trouble breathing or feeling short of breath Feeling dizzy or light-headed, even fainting Difficulty walking short distances Swollen ankles or feet Difficulty sleeping or needing to sleep sitting up Decline in activity level or reduced ability to do normal activities

## 2022-02-05 NOTE — Telephone Encounter (Signed)
Spoke with patient regarding results/recommendations.  

## 2022-02-25 NOTE — Progress Notes (Signed)
[facility] Note  02/26/2022    CHIEF COMPLAINT Patient presents for Retina Follow Up   HISTORY OF PRESENT ILLNESS: Margaret [name] is a 89 y.o. female who presents to the clinic today for:   HPI     Retina Follow Up   Patient presents with  Wet AMD.  This started 5 weeks ago.  Duration of 5 weeks.  Since onset it is stable.  I, the attending physician,  performed the HPI with the patient and updated documentation appropriately.        Comments   5 week retina follow up ARMD OD and I'VE OD pt is reporting that her vision seems to fluctuate she has been having some irritation and has been using restasis bid ou she has not been using her systane        Last edited by Bernarda Caffey, MD on 02/26/2022  3:59 PM.    Patient states   Referring physician: Ma Hillock, DO [address] [place],  [place] [id number]  HISTORICAL INFORMATION:  Selected notes from the MEDICAL RECORD NUMBER Self referral for macular degeneration LEE: 03.21.19 (Dr. Park Liter in [place], [place]) [BCVA: OD: 20/40 OS: 20/40] Ocular Hx-POAG, non-exu ARMD, DES, pseudo, YAG PMH-astham, arthritis,    CURRENT MEDICATIONS: Current Outpatient Medications (Ophthalmic Drugs)  Medication Sig   cycloSPORINE (RESTASIS) 0.05 % ophthalmic emulsion 1 drop 2 (two) times daily.   polyvinyl alcohol (LIQUIFILM TEARS) 1.4 % ophthalmic solution Place 1 drop into both eyes as needed for dry eyes.   No current facility-administered medications for this visit. (Ophthalmic Drugs)   Current Outpatient Medications (Other)  Medication Sig   albuterol (VENTOLIN HFA) 108 (90 Base) MCG/ACT inhaler Inhale 2 puffs into the lungs every 6 (six) hours as needed for wheezing or shortness of breath.   CALCIUM PO Take 1 tablet by mouth 2 (two) times daily. Slow release   denosumab (PROLIA) 60 MG/ML SOSY injection as directed Subcutaneous   folic acid (FOLVITE) 0.5 MG tablet Take 0.5 mg by mouth daily.   lactase (LACTAID)  3000 units tablet Take 2 tablets (6,000 Units total) by mouth 3 (three) times daily as needed (lactose intolerance).   levothyroxine (SYNTHROID) 50 MCG tablet Take 1 tablet (50 mcg total) by mouth daily before breakfast.   montelukast (SINGULAIR) 10 MG tablet Take 1 tablet (10 mg total) by mouth at bedtime.   Multiple Vitamins-Minerals (PRESERVISION AREDS 2) CAPS Take 1 capsule by mouth 2 (two) times daily.   predniSONE (DELTASONE) 5 MG tablet Take 5 mg by mouth daily as needed (joint/muscle pain).   pregabalin (LYRICA) 25 MG capsule Take 1 capsule (25 mg total) by mouth at bedtime.   No current facility-administered medications for this visit. (Other)   REVIEW OF SYSTEMS: ROS   Positive for: Eyes Last edited by Bernarda Caffey, MD on 02/26/2022  3:59 PM.     ALLERGIES Allergies  Allergen Reactions   Augmentin [Amoxicillin-Pot Clavulanate] Swelling and Rash    Swelling in face   Lactose Intolerance (Gi)    PAST MEDICAL HISTORY Past Medical History:  Diagnosis Date   Allergy    Arthritis of left knee    Asthma    Carotid artery stenosis 10/17/2012   stable report in 2018- "moderate on right, minimal on left"   Carpal tunnel syndrome 2018   left.   Cervical radiculopathy 05/16/2015   DDD C4-C5, C5-C6, and C6-C7.  Facet arthropathy throughout cervical spine.  Normal foraminal narrowing  secondary to uncovertebral  arthropathy is seen bilaterally at C3-C4 and C5/C6   Chicken pox    DDD (degenerative disc disease), lumbar 04/26/2015   Mild levoscoliosis, right anterior listhesis of L4 and L5.  DDD at every level.  Facet arthropathy at multiple levels.   Deviated septum    Glaucoma    both eyes, mild opened angle   Heart murmur    History of colon polyps    History of fall    History of frequent urinary tract infections    klebs- pansensitive. c. freundii - resitant to cefazolin and augementin   Hypothyroidism    Intra-abdominal abscess ([place]) 03/10/2020   Iron deficiency anemia     prior pcp told her to take Fe 325 QD   Macular degeneration    h/o retinal edema   Mitral valve prolapse 08/27/2011   Murmur, cardiac 10/17/2012   Osteoarthritis    Osteoporosis    Pneumonia    Positive TB test    In college    Pseudophakia of both eyes    PVD (peripheral vascular disease) ([place])    Rheumatic fever    89 years old    Syncope 05/17/2017   Thyroid disease    Urinary incontinence    Vertigo    had been prescribed antivert   Vitamin D deficiency    Past Surgical History:  Procedure Laterality Date   CARPAL TUNNEL RELEASE Bilateral 2019   CATARACT EXTRACTION Bilateral 1997   COLONOSCOPY  2010   NASAL SEPTUM SURGERY  2007   WRIST FRACTURE SURGERY Right 1996   FAMILY HISTORY Family History  Problem Relation Age of Onset   Arthritis Mother    COPD Mother    Prostate cancer Father    Arthritis Father    Hearing loss Father    Heart disease Father    Heart attack Father    Stroke Maternal Grandmother    Liver cancer Sister    Arthritis Brother    Prostate cancer Brother    COPD Brother    Heart disease Brother    Breast cancer Daughter    Blindness Maternal Grandfather    Stroke Maternal Grandfather    COPD Paternal Grandmother    Breast cancer Paternal Grandmother    SOCIAL HISTORY Social History   Tobacco Use   Smoking status: Former    Packs/day: 0.50    Types: Cigarettes    Start date: 1952    Quit date: 1965    Years since quitting: 59.0   Smokeless tobacco: Never  Vaping Use   Vaping Use: Never used  Substance Use Topics   Alcohol use: Not Currently    Alcohol/week: 5.0 standard drinks of alcohol    Types: 5 Standard drinks or equivalent per week   Drug use: Never       OPHTHALMIC EXAM:  Base Eye Exam     Visual Acuity (Snellen - Linear)       Right Left   Dist cc 20/50 20/30 -1   Dist ph cc NI NI         Tonometry (Tonopen, 1:37 PM)       Right Left   Pressure 16 16         Pupils       Pupils Dark Light Shape  React APD   Right PERRL 3 2 Round Brisk None   Left PERRL 3 2 Round Brisk None         Visual Fields  Left Right    Full Full         Extraocular Movement       Right Left    Full, Ortho Full, Ortho         Neuro/Psych     Oriented x3: Yes   Mood/Affect: Normal         Dilation     Both eyes: 2.5% Phenylephrine @ 1:37 PM           Slit Lamp and Fundus Exam     External Exam       Right Left   External Normal Normal         Slit Lamp Exam       Right Left   Lids/Lashes Dermatochalasis - upper lid, Telangiectasia Dermatochalasis - upper lid, mild Meibomian gland dysfunction   Conjunctiva/Sclera Temporal Pinguecula White and quiet   Cornea Arcus, 1+ Punctate epithelial erosions, mild tear film debris, fine endo pigment Arcus, 1-2+ inferior Punctate epithelial erosions, mild tear film debris, fine endo pigment   Anterior Chamber Deep and quiet, narrow temporal angle Deep and quiet   Iris Round and dilated Round and dilated, pigmented mass at 0200 and 0730 behind iris, anterior to IOL   Lens PC IOL in good position with open PC PC IOL in good position with open PC   Anterior Vitreous Vitreous syneresis Vitreous syneresis, Posterior vitreous detachment         Fundus Exam       Right Left   Disc 360 Peripapillary atrophy, Sharp rim, 2+ Pallor sharp rim, temporal PPA, mild Pallor   C/D Ratio 0.4 0.3   Macula Flat, Blunted foveal reflex, drusen, RPE mottling and clumping, +central IRF / cystic changes -- slightly increased, punctate IRH -- stably resolved, no SRH, early RPE atrophy Flat, Blunted foveal reflex, drusen, RPE mottling and clumping, no heme or edema   Vessels attenuated, Tortuous attenuated, Tortuous   Periphery Attached, scattered Reticular degeneration, pigmented cystoid degeneration temporally, No heme Attached, scattered Reticular degeneration, No heme           Refraction     Wearing Rx       Sphere Cylinder Axis Add    Right -0.75 +2.25 175 +2.75   Left -0.25 +1.25 165 +2.75           IMAGING AND PROCEDURES  Imaging and Procedures for '@TODAY'$ @  OCT, Retina - OU - Both Eyes       Right Eye Quality was good. Central Foveal Thickness: 328. Progression has worsened. Findings include no SRF, abnormal foveal contour, retinal drusen , intraretinal hyper-reflective material, epiretinal membrane, intraretinal fluid, outer retinal atrophy (Mild Interval increase in central cyst, Partial PVD).   Left Eye Quality was good. Central Foveal Thickness: 300. Progression has been stable. Findings include normal foveal contour, no IRF, no SRF, retinal drusen , outer retinal atrophy (Trace ERM, Partial PVD).   Notes *Images captured and stored on drive  Diagnosis / Impression:  OD: Exudative ARMD - Mild Interval increase in central cyst OS: Nonexudative ARMD OS Partial PVD OU  Clinical management:  See below  Abbreviations: NFP - Normal foveal profile. CME - cystoid macular edema. PED - pigment epithelial detachment. IRF - intraretinal fluid. SRF - subretinal fluid. EZ - ellipsoid zone. ERM - epiretinal membrane. ORA - outer retinal atrophy. ORT - outer retinal tubulation. SRHM - subretinal hyper-reflective material       Intravitreal Injection, Pharmacologic Agent - OD - Right Eye  Time Out 02/26/2022. 2:20 PM. Confirmed correct patient, procedure, site, and patient consented.   Anesthesia Topical anesthesia was used. Anesthetic medications included Lidocaine 2%, Proparacaine 0.5%.   Procedure Preparation included 5% betadine to ocular surface, eyelid speculum. A (32g) needle was used.   Injection: 6 mg faricimab-svoa 6 MG/0.05ML   Route: Intravitreal, Site: Right Eye   NDC: [phone], Lot: [id number], Expiration date: 02/22/2024, Waste: 0 mL   Post-op Post injection exam found visual acuity of at least counting fingers. The patient tolerated the procedure well. There were no complications.  The patient received written and verbal post procedure care education. Post injection medications were not given.            ASSESSMENT/PLAN:    ICD-10-CM   1. Exudative age-related macular degeneration of right eye with active choroidal neovascularization (HCC)  H35.3211 OCT, Retina - OU - Both Eyes    Intravitreal Injection, Pharmacologic Agent - OD - Right Eye    faricimab-svoa (VABYSMO) '6mg'$ /0.71m intravitreal injection    2. 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 Long-term use of Plaquenil  Z79.899     5. Pseudophakia of both eyes  Z96.1     6. Primary open angle glaucoma (POAG) of both eyes, mild stage  H40.1131     7. Dry eyes  H04.123      1. Exudative age related macular degeneration, OD  - conversion from nonexudative ARMD noted on 9.23.22 visit  - s/p IVA OD #1 (09.23.22), #2 (10.21.22), #3 (11.18.22), #4 (12.30.22), #5 (02.13.23), #6 (03.24.23), #7 (05.08.23), #8 (06.09.23) -- IVA resistance  - s/p IVE OD #1 (07.24.23), #2 (08.25.23), #3 (09.29.23), #4 (10.27.23) -- IVE resistance  - s/p IVV OD #1(12.01.23) - BCVA at 20/50 -- decreased - OCT shows OD: Mild Interval increase in central cyst at 5 weeks - recommend IVV OD #2 today, 01.05.2024 w/ f/u back to 4 wks - pt wishes to be treated with IVV - RBA of procedure discussed, questions answered - Eylea informed consent obtained and signed, 07.24.23 - Avastin informed consent obtained and signed, 09.23.22 - Vabysmo informed consent obtained and signed, 12.01.23 - see procedure note - f/u 4 weeks -- DFE/OCT, possible injection   2. Age related macular degeneration, non-exudative OS  - intermediate stage-no change from previous visit - The incidence, anatomy, and pathology of dry AMD, risk of progression, and the AREDS and AREDS 2 study including smoking risks discussed with patient.   - recommend Amsler grid monitoring  3.  Iris/ciliary body mass OS - pigmented lesion behind iris but anterior to PCIOL at 0730 and 0300 - 0730 lesion spans 0630 to 0830 behind dilated iris and larger than 0300 lesion  - patient asymptomatic - pt [facility] Dr[facility]ow [facility] 11[facility]01[facility]22 who dx her with iris margin cyst  4. Plaquenil use for RA  - baseline testing done 05.20.20 -- started plaquenil early May 2020 f or RA - HVF 10-2 showed non-specific defects OU -- essentially normal baseline test - OCT shows no obvious plaquenil-related changes, but interval development of exudative ARMD OD as above  - currently taking '200mg'$  / day per Dr. SAudelia Hivesinstructions --> 3.29 mg/kg body wt per day  - po prednisone, currently as needed which is about once a week - and is also now on '10mg'$  po MTX weekly + folic acid - the American Academy of Ophthalmology  recommends dosing '5mg'$ /kg per day to reduce risk of retinal toxicity  - recommend consideration of using alternate medication for RA especially given co-morbidity of age-related macular degeneration OU  - managed by Dr[facility] Dossie Der at [facility]  5. Pseudophakia OU - s/p CE/IOL w/ ?canaloplasty OU by Dr. Robyne Askew at [facility]  - doing well  - continue to monitor  6. History of Glaucoma   - not currently on gtts -- IOP 16 OU - ?underwent canaloplasty in conjunction with CEIOL at [facility]   - referred to and now under the expert care of Dr. Kathlen Mody -- initial Visit/consult 10.3.2019  7. Dry Eye Syndrome  - use AT's OU as needed  Ophthalmic Meds Ordered this visit:  Meds ordered this encounter  Medications   faricimab-svoa (VABYSMO) '6mg'$ /0.44m intravitreal injection     Return in about 4 weeks (around 03/26/2022) for f/u exu ARMD OD, DFE, OCT.  There are no Patient Instructions on file for this visit. This document serves as a record of services personally performed by BGardiner Sleeper MD, PhD. It was created on their behalf by BBernarda Caffey  MD, an ophthalmic technician. The creation of this record is the provider's dictation and/or activities during the visit.    Electronically signed by: BBernarda Caffey MD 02/26/2022 4:00 PM  BGardiner Sleeper M.D., Ph.D. Diseases & Surgery of the Retina and Vitreous [facility] I have reviewed the above documentation for accuracy and completeness, and I agree with the above. BGardiner Sleeper M.D., Ph.D. 02/26/22 4:01 PM  Abbreviations: M myopia (nearsighted); A astigmatism; H hyperopia (farsighted); P presbyopia; Mrx spectacle prescription;  CTL contact lenses; OD right eye; OS left eye; OU both eyes  XT exotropia; ET esotropia; PEK punctate epithelial keratitis; PEE punctate epithelial erosions; DES dry eye syndrome; MGD meibomian gland dysfunction; ATs artificial tears; PFAT's preservative free artificial tears; NRoyal Centernuclear sclerotic cataract; PSC posterior subcapsular cataract; ERM epi-retinal membrane; PVD posterior vitreous detachment; RD retinal detachment; DM diabetes mellitus; DR diabetic retinopathy; NPDR non-proliferative diabetic retinopathy; PDR proliferative diabetic retinopathy; CSME clinically significant macular edema; DME diabetic macular edema; dbh dot blot hemorrhages; CWS cotton wool spot; POAG primary open angle glaucoma; C/D cup-to-disc ratio; HVF humphrey visual field; GVF goldmann visual field; OCT optical coherence tomography; IOP intraocular pressure; BRVO Branch retinal vein occlusion; CRVO central retinal vein occlusion; CRAO central retinal artery occlusion; BRAO branch retinal artery occlusion; RT retinal tear; SB scleral buckle; PPV pars plana vitrectomy; VH Vitreous hemorrhage; PRP panretinal laser photocoagulation; IVK intravitreal kenalog; VMT vitreomacular traction; MH Macular hole;  NVD neovascularization of the disc; NVE neovascularization elsewhere; AREDS age related eye disease study; ARMD age related macular degeneration; POAG primary open angle  glaucoma; EBMD epithelial/anterior basement membrane dystrophy; ACIOL anterior chamber intraocular lens; IOL intraocular lens; PCIOL posterior chamber intraocular lens; Phaco/IOL phacoemulsification with intraocular lens placement; PCrugersphotorefractive keratectomy; LASIK laser assisted in situ keratomileusis; HTN hypertension; DM diabetes mellitus; COPD chronic obstructive pulmonary disease

## 2022-02-26 ENCOUNTER — Ambulatory Visit (INDEPENDENT_AMBULATORY_CARE_PROVIDER_SITE_OTHER): Payer: Medicare HMO | Admitting: Ophthalmology

## 2022-02-26 ENCOUNTER — Encounter (INDEPENDENT_AMBULATORY_CARE_PROVIDER_SITE_OTHER): Payer: Self-pay | Admitting: Ophthalmology

## 2022-02-26 DIAGNOSIS — D4989 Neoplasm of unspecified behavior of other specified sites: Secondary | ICD-10-CM | POA: Diagnosis not present

## 2022-02-26 DIAGNOSIS — H401131 Primary open-angle glaucoma, bilateral, mild stage: Secondary | ICD-10-CM

## 2022-02-26 DIAGNOSIS — Z961 Presence of intraocular lens: Secondary | ICD-10-CM

## 2022-02-26 DIAGNOSIS — H04123 Dry eye syndrome of bilateral lacrimal glands: Secondary | ICD-10-CM

## 2022-02-26 DIAGNOSIS — H353122 Nonexudative age-related macular degeneration, left eye, intermediate dry stage: Secondary | ICD-10-CM

## 2022-02-26 DIAGNOSIS — Z79899 Other long term (current) drug therapy: Secondary | ICD-10-CM

## 2022-02-26 DIAGNOSIS — H353211 Exudative age-related macular degeneration, right eye, with active choroidal neovascularization: Secondary | ICD-10-CM | POA: Diagnosis not present

## 2022-02-26 MED ORDER — FARICIMAB-SVOA 6 MG/0.05ML IZ SOLN
6.0000 mg | INTRAVITREAL | Status: AC | PRN
  Administered 2022-02-26: 6 mg via INTRAVITREAL

## 2022-03-10 NOTE — Progress Notes (Incomplete)
Cardiology Office Note:    Date:  03/10/2022   ID:  [name], DOB 01/01/1930, MRN [id number]  PCP:  Ma Hillock, DO  Cardiologist:  None  Referring MD: Ma Hillock, DO   CC: New patient evaluation for aortic/mitral valve stenosis  History of Present Illness:    Margaret [name] is a 89 y.o. female with a hx of carotid artery stenosis, heart murmur, mitral valve prolapse, peripheral vascular disease, hypothyroidism, iron deficiency anemia, asthma, pneumonia, cervical radiculopathy, osteoarthritis, rheumatic fever, and vertigo, who is seen as a new consult at the request of [name], [name], DO for the evaluation and management of stenoses of the aortic and mitral valves.  She saw her PCP Dr. Raoul Pitch on 12/31/2021. During examination a cardiac murmur was heard that sounded louder/more pronounced than in the past. An echocardiogram (01/2022) was ordered and revealed LVEF 60-65%, mild LVH, and grade 2 diastolic dysfunction. Trivial mitral valve regurgitation, mild mitral stenosis, and severe mitral annular calcification. There was severe calcifcation of the aortic valve. Aortic valve regurgitation was not visualized. Moderate to severe aortic valve stenosis. She was referred for further evaluation and to discuss if intervention is needed.   Cardiovascular risk factors: Prior clinical ASCVD:  Comorbid conditions:  Metabolic syndrome/Obesity: Chronic inflammatory conditions: Tobacco use history: Family history: Prior cardiac testing and/or incidental findings on other testing (ie coronary calcium): Exercise level: Current diet:   She denies any palpitations, chest pain, shortness of breath, or peripheral edema. No lightheadedness, headaches, syncope, orthopnea, or PND.  Past Medical History:  Diagnosis Date   Allergy    Arthritis of left knee    Asthma    Carotid artery stenosis 10/17/2012   stable report in 2018- "moderate on right, minimal on left"   Carpal tunnel syndrome  2018   left.   Cervical radiculopathy 05/16/2015   DDD C4-C5, C5-C6, and C6-C7.  Facet arthropathy throughout cervical spine.  Normal foraminal narrowing secondary to uncovertebral  arthropathy is seen bilaterally at C3-C4 and C5/C6   Chicken pox    DDD (degenerative disc disease), lumbar 04/26/2015   Mild levoscoliosis, right anterior listhesis of L4 and L5.  DDD at every level.  Facet arthropathy at multiple levels.   Deviated septum    Glaucoma    both eyes, mild opened angle   Heart murmur    History of colon polyps    History of fall    History of frequent urinary tract infections    klebs- pansensitive. c. freundii - resitant to cefazolin and augementin   Hypothyroidism    Intra-abdominal abscess ([place]) 03/10/2020   Iron deficiency anemia    prior pcp told her to take Fe 325 QD   Macular degeneration    h/o retinal edema   Mitral valve prolapse 08/27/2011   Murmur, cardiac 10/17/2012   Osteoarthritis    Osteoporosis    Pneumonia    Positive TB test    In college    Pseudophakia of both eyes    PVD (peripheral vascular disease) ([place])    Rheumatic fever    89 years old    Syncope 05/17/2017   Thyroid disease    Urinary incontinence    Vertigo    had been prescribed antivert   Vitamin D deficiency     Past Surgical History:  Procedure Laterality Date   CARPAL TUNNEL RELEASE Bilateral 2019   CATARACT EXTRACTION Bilateral 1997   COLONOSCOPY  2010   NASAL SEPTUM SURGERY  2007  WRIST FRACTURE SURGERY Right 1996    Current Medications: Current Outpatient Medications on File Prior to Visit  Medication Sig   albuterol (VENTOLIN HFA) 108 (90 Base) MCG/ACT inhaler Inhale 2 puffs into the lungs every 6 (six) hours as needed for wheezing or shortness of breath.   CALCIUM PO Take 1 tablet by mouth 2 (two) times daily. Slow release   cycloSPORINE (RESTASIS) 0.05 % ophthalmic emulsion 1 drop 2 (two) times daily.   denosumab (PROLIA) 60 MG/ML SOSY injection as directed  Subcutaneous   folic acid (FOLVITE) 0.5 MG tablet Take 0.5 mg by mouth daily.   lactase (LACTAID) 3000 units tablet Take 2 tablets (6,000 Units total) by mouth 3 (three) times daily as needed (lactose intolerance).   levothyroxine (SYNTHROID) 50 MCG tablet Take 1 tablet (50 mcg total) by mouth daily before breakfast.   montelukast (SINGULAIR) 10 MG tablet Take 1 tablet (10 mg total) by mouth at bedtime.   Multiple Vitamins-Minerals (PRESERVISION AREDS 2) CAPS Take 1 capsule by mouth 2 (two) times daily.   polyvinyl alcohol (LIQUIFILM TEARS) 1.4 % ophthalmic solution Place 1 drop into both eyes as needed for dry eyes.   predniSONE (DELTASONE) 5 MG tablet Take 5 mg by mouth daily as needed (joint/muscle pain).   pregabalin (LYRICA) 25 MG capsule Take 1 capsule (25 mg total) by mouth at bedtime.   No current facility-administered medications on file prior to visit.     Allergies:   Augmentin [amoxicillin-pot clavulanate] and Lactose intolerance (gi)   Social History   Tobacco Use   Smoking status: Former    Packs/day: 0.50    Types: Cigarettes    Start date: 1952    Quit date: 1965    Years since quitting: 59.0   Smokeless tobacco: Never  Vaping Use   Vaping Use: Never used  Substance Use Topics   Alcohol use: Not Currently    Alcohol/week: 5.0 standard drinks of alcohol    Types: 5 Standard drinks or equivalent per week   Drug use: Never    Family History: family history includes Arthritis in her brother, father, and mother; Blindness in her maternal grandfather; Breast cancer in her daughter and paternal grandmother; COPD in her brother, mother, and paternal grandmother; Hearing loss in her father; Heart attack in her father; Heart disease in her brother and father; Liver cancer in her sister; Prostate cancer in her brother and father; Stroke in her maternal grandfather and maternal grandmother.  ROS:   Please see the history of present illness.  Additional pertinent  ROS: Constitutional: Negative for chills, fever, night sweats, unintentional weight loss  HENT: Negative for ear pain and hearing loss.   Eyes: Negative for loss of vision and eye pain.  Respiratory: Negative for cough, sputum, wheezing.   Cardiovascular: See HPI. Gastrointestinal: Negative for abdominal pain, melena, and hematochezia.  Genitourinary: Negative for dysuria and hematuria.  Musculoskeletal: Negative for falls and myalgias.  Skin: Negative for itching and rash.  Neurological: Negative for focal weakness, focal sensory changes and loss of consciousness.  Endo/Heme/Allergies: Does not bruise/bleed easily.     EKGs/Labs/Other Studies Reviewed:    The following studies were reviewed today:  Echo  02/04/2022:  1. Left ventricular ejection fraction, by estimation, is 60 to 65%. The  left ventricle has normal function. The left ventricle has no regional  wall motion abnormalities. There is mild left ventricular hypertrophy.  Left ventricular diastolic parameters  are consistent with Grade II diastolic dysfunction (pseudonormalization).  Elevated  left atrial pressure.   2. Right ventricular systolic function is normal. The right ventricular  size is normal. There is normal pulmonary artery systolic pressure. The  estimated right ventricular systolic pressure is 56.3 mmHg.   3. The mitral valve is abnormal. Trivial mitral valve regurgitation. Mild  mitral stenosis. Severe mitral annular calcification.   4. The inferior vena cava is normal in size with greater than 50%  respiratory variability, suggesting right atrial pressure of 3 mmHg.   5. The aortic valve is calcified. There is severe calcifcation of the  aortic valve. Aortic valve regurgitation is not visualized. Moderate to  severe aortic valve stenosis. Moderate AS by gradients (Vmax 3.6 m/s, MG  30 mmHg), severe by AVA (0.8cm^2) and  DI (0.24)    EKG:  EKG is personally reviewed.   03/11/2022:  ***  Recent  Labs: 06/05/2021: BUN 16; Creatinine, Ser 0.70; Potassium 3.9; Sodium 138; TSH 3.05   Recent Lipid Panel    Component Value Date/Time   CHOL 217 (A) 05/19/2017 0000   HDL 68 05/19/2017 0000   LDLCALC 131 05/19/2017 0000   LDLDIRECT 128 (H) 09/05/2020 1501    Physical Exam:    VS:  There were no vitals taken for this visit.    Wt Readings from Last 3 Encounters:  12/31/21 133 lb 3.2 oz (60.4 kg)  09/02/21 135 lb (61.2 kg)  06/05/21 132 lb (59.9 kg)    GEN: Well nourished, well developed in no acute distress HEENT: Normal, moist mucous membranes NECK: No JVD CARDIAC: regular rhythm, normal S1 and S2, no rubs or gallops. No murmur. VASCULAR: Radial and DP pulses 2+ bilaterally. No carotid bruits RESPIRATORY:  Clear to auscultation without rales, wheezing or rhonchi  ABDOMEN: Soft, non-tender, non-distended MUSCULOSKELETAL:  Ambulates independently SKIN: Warm and dry, no edema NEUROLOGIC:  Alert and oriented x 3. No focal neuro deficits noted. PSYCHIATRIC:  Normal affect    ASSESSMENT:    No diagnosis found. PLAN:     Cardiac risk counseling and prevention recommendations: -recommend heart healthy/Mediterranean diet, with whole grains, fruits, vegetable, fish, lean meats, nuts, and olive oil. Limit salt. -recommend moderate walking, 3-5 times/week for 30-50 minutes each session. Aim for at least 150 minutes.week. Goal should be pace of 3 miles/hours, or walking 1.5 miles in 30 minutes -recommend avoidance of tobacco products. Avoid excess alcohol. -ASCVD risk score: The ASCVD Risk score (Arnett DK, et al., 2019) failed to calculate for the following reasons:   The 2019 ASCVD risk score is only valid for ages 40 to 15    Plan for follow up: *** or sooner as needed.  Buford Dresser, MD, PhD, [facility]    Medication Adjustments/Labs and Tests Ordered: Current medicines are reviewed at length with the patient today.  Concerns regarding  medicines are outlined above.   No orders of the defined types were placed in this encounter.  No orders of the defined types were placed in this encounter.  There are no Patient Instructions on file for this visit.   I,Mathew Stumpf,acting as a Education administrator for [employer], MD.,have documented all relevant documentation on the behalf of Buford Dresser, MD,as directed by  Buford Dresser, MD while in the presence of Buford Dresser, MD.  ***  Signed, Buford Dresser, MD PhD 03/10/2022 2:46 PM    [place]

## 2022-03-11 ENCOUNTER — Encounter (HOSPITAL_BASED_OUTPATIENT_CLINIC_OR_DEPARTMENT_OTHER): Payer: Self-pay

## 2022-03-11 ENCOUNTER — Ambulatory Visit (HOSPITAL_BASED_OUTPATIENT_CLINIC_OR_DEPARTMENT_OTHER): Payer: Medicare HMO | Admitting: Cardiology

## 2022-03-25 NOTE — Progress Notes (Signed)
[facility] Note  03/26/2022    CHIEF COMPLAINT Patient presents for Retina Follow Up   HISTORY OF PRESENT ILLNESS: Margaret [name] is a 89 y.o. female who presents to the clinic today for:   HPI     Retina Follow Up   Patient presents with  Wet AMD.  In right eye.  This started 4 weeks ago.  I, the attending physician,  performed the HPI with the patient and updated documentation appropriately.        Comments   Patient here for 4 weeks retina follow up for exu ARMD OD. Patient states vision not any better. No eye pain. They smart-itchy. Closes eyes for a while cause they hurt. Uses Retasis BID OU.      Last edited by Bernarda Caffey, MD on 03/26/2022  3:58 PM.     Patient states   Referring physician: Ma Hillock, DO [address] [place],  [place] [address]  HISTORICAL INFORMATION:  Selected notes from the MEDICAL RECORD NUMBER Self referral for macular degeneration LEE: 03.21.19 (Dr. Park Liter in [place], [place]) [BCVA: OD: 20/40 OS: 20/40] Ocular Hx-POAG, non-exu ARMD, DES, pseudo, YAG PMH-astham, arthritis,    CURRENT MEDICATIONS: Current Outpatient Medications (Ophthalmic Drugs)  Medication Sig   cycloSPORINE (RESTASIS) 0.05 % ophthalmic emulsion 1 drop 2 (two) times daily.   polyvinyl alcohol (LIQUIFILM TEARS) 1.4 % ophthalmic solution Place 1 drop into both eyes as needed for dry eyes.   No current facility-administered medications for this visit. (Ophthalmic Drugs)   Current Outpatient Medications (Other)  Medication Sig   albuterol (VENTOLIN HFA) 108 (90 Base) MCG/ACT inhaler Inhale 2 puffs into the lungs every 6 (six) hours as needed for wheezing or shortness of breath.   CALCIUM PO Take 1 tablet by mouth 2 (two) times daily. Slow release   denosumab (PROLIA) 60 MG/ML SOSY injection as directed Subcutaneous   folic acid (FOLVITE) 0.5 MG tablet Take 0.5 mg by mouth daily.   lactase (LACTAID) 3000 units tablet Take 2 tablets (6,000 Units  total) by mouth 3 (three) times daily as needed (lactose intolerance).   levothyroxine (SYNTHROID) 50 MCG tablet Take 1 tablet (50 mcg total) by mouth daily before breakfast.   montelukast (SINGULAIR) 10 MG tablet Take 1 tablet (10 mg total) by mouth at bedtime.   Multiple Vitamins-Minerals (PRESERVISION AREDS 2) CAPS Take 1 capsule by mouth 2 (two) times daily.   predniSONE (DELTASONE) 5 MG tablet Take 5 mg by mouth daily as needed (joint/muscle pain).   pregabalin (LYRICA) 25 MG capsule Take 1 capsule (25 mg total) by mouth at bedtime.   No current facility-administered medications for this visit. (Other)   REVIEW OF SYSTEMS: ROS   Positive for: Musculoskeletal, Endocrine, Eyes Negative for: Constitutional, Gastrointestinal, Neurological, Skin, Genitourinary, HENT, Cardiovascular, Respiratory, Psychiatric, Allergic/Imm, Heme/Lymph Last edited by Theodore Demark, COA on 03/26/2022  1:59 PM.     ALLERGIES Allergies  Allergen Reactions   Augmentin [Amoxicillin-Pot Clavulanate] Swelling and Rash    Swelling in face   Lactose Intolerance (Gi)    PAST MEDICAL HISTORY Past Medical History:  Diagnosis Date   Allergy    Arthritis of left knee    Asthma    Carotid artery stenosis 10/17/2012   stable report in 2018- "moderate on right, minimal on left"   Carpal tunnel syndrome 2018   left.   Cervical radiculopathy 05/16/2015   DDD C4-C5, C5-C6, and C6-C7.  Facet arthropathy throughout cervical spine.  Normal foraminal narrowing secondary to uncovertebral  arthropathy is seen bilaterally at C3-C4 and C5/C6   Chicken pox    DDD (degenerative disc disease), lumbar 04/26/2015   Mild levoscoliosis, right anterior listhesis of L4 and L5.  DDD at every level.  Facet arthropathy at multiple levels.   Deviated septum    Glaucoma    both eyes, mild opened angle   Heart murmur    History of colon polyps    History of fall    History of frequent urinary tract infections    klebs-  pansensitive. c. freundii - resitant to cefazolin and augementin   Hypothyroidism    Intra-abdominal abscess ([place]) 03/10/2020   Iron deficiency anemia    prior pcp told her to take Fe 325 QD   Macular degeneration    h/o retinal edema   Mitral valve prolapse 08/27/2011   Murmur, cardiac 10/17/2012   Osteoarthritis    Osteoporosis    Pneumonia    Positive TB test    In college    Pseudophakia of both eyes    PVD (peripheral vascular disease) ([place])    Rheumatic fever    89 years old    Syncope 05/17/2017   Thyroid disease    Urinary incontinence    Vertigo    had been prescribed antivert   Vitamin D deficiency    Past Surgical History:  Procedure Laterality Date   CARPAL TUNNEL RELEASE Bilateral 2019   CATARACT EXTRACTION Bilateral 1997   COLONOSCOPY  2010   NASAL SEPTUM SURGERY  2007   WRIST FRACTURE SURGERY Right 1996   FAMILY HISTORY Family History  Problem Relation Age of Onset   Arthritis Mother    COPD Mother    Prostate cancer Father    Arthritis Father    Hearing loss Father    Heart disease Father    Heart attack Father    Stroke Maternal Grandmother    Liver cancer Sister    Arthritis Brother    Prostate cancer Brother    COPD Brother    Heart disease Brother    Breast cancer Daughter    Blindness Maternal Grandfather    Stroke Maternal Grandfather    COPD Paternal Grandmother    Breast cancer Paternal Grandmother    SOCIAL HISTORY Social History   Tobacco Use   Smoking status: Former    Packs/day: 0.50    Types: Cigarettes    Start date: 1952    Quit date: 1965    Years since quitting: 59.1   Smokeless tobacco: Never  Vaping Use   Vaping Use: Never used  Substance Use Topics   Alcohol use: Not Currently    Alcohol/week: 5.0 standard drinks of alcohol    Types: 5 Standard drinks or equivalent per week   Drug use: Never       OPHTHALMIC EXAM:  Base Eye Exam     Visual Acuity (Snellen - Linear)       Right Left   Dist cc 20/50 -2  20/30 +2   Dist ph cc NI NI    Correction: Glasses         Tonometry (Tonopen, 1:56 PM)       Right Left   Pressure 17 21         Pupils       Dark Light Shape React APD   Right 3 2 Round Brisk None   Left 3 2 Round Brisk None  Visual Fields (Counting fingers)       Left Right    Full Full         Extraocular Movement       Right Left    Full, Ortho Full, Ortho         Neuro/Psych     Oriented x3: Yes   Mood/Affect: Normal         Dilation     Both eyes: 1.0% Mydriacyl, 2.5% Phenylephrine @ 1:56 PM           Slit Lamp and Fundus Exam     External Exam       Right Left   External Normal Normal         Slit Lamp Exam       Right Left   Lids/Lashes Dermatochalasis - upper lid, Telangiectasia Dermatochalasis - upper lid, mild Meibomian gland dysfunction   Conjunctiva/Sclera Temporal Pinguecula White and quiet   Cornea Arcus, 1+ Punctate epithelial erosions, mild tear film debris, fine endo pigment Arcus, 1-2+ inferior Punctate epithelial erosions, mild tear film debris, fine endo pigment   Anterior Chamber Deep and quiet, narrow temporal angle Deep and quiet   Iris Round and dilated Round and dilated, pigmented mass at 0200 and 0730 behind iris, anterior to IOL   Lens PC IOL in good position with open PC PC IOL in good position with open PC   Anterior Vitreous Vitreous syneresis Vitreous syneresis, Posterior vitreous detachment         Fundus Exam       Right Left   Disc 360 Peripapillary atrophy, Sharp rim, 2+ Pallor sharp rim, temporal PPA, mild Pallor   C/D Ratio 0.4 0.3   Macula Flat, Blunted foveal reflex, drusen, RPE mottling and clumping, +central IRF / cystic changes -- slightly improved, punctate IRH -- stably resolved, no SRH, early RPE atrophy Flat, Blunted foveal reflex, drusen, RPE mottling and clumping, no heme or edema   Vessels attenuated, Tortuous attenuated, Tortuous   Periphery Attached, scattered Reticular  degeneration, pigmented cystoid degeneration temporally, No heme Attached, scattered Reticular degeneration, No heme           Refraction     Wearing Rx       Sphere Cylinder Axis Add   Right -0.75 +2.25 175 +2.75   Left -0.25 +1.25 165 +2.75           IMAGING AND PROCEDURES  Imaging and Procedures for '@TODAY'$ @  OCT, Retina - OU - Both Eyes       Right Eye Quality was good. Central Foveal Thickness: 324. Progression has improved. Findings include no SRF, abnormal foveal contour, retinal drusen , intraretinal hyper-reflective material, epiretinal membrane, intraretinal fluid, outer retinal atrophy (Persistent IRF / IRHM centrally -- slightly improved, Partial PVD).   Left Eye Quality was good. Central Foveal Thickness: 289. Progression has been stable. Findings include normal foveal contour, no IRF, no SRF, retinal drusen , outer retinal atrophy (Trace ERM, Partial PVD).   Notes *Images captured and stored on drive  Diagnosis / Impression:  OD: Exudative ARMD - Persistent IRF / IRHM centrally -- slightly improved, Partial PVD OS: Nonexudative ARMD OS Partial PVD OU  Clinical management:  See below  Abbreviations: NFP - Normal foveal profile. CME - cystoid macular edema. PED - pigment epithelial detachment. IRF - intraretinal fluid. SRF - subretinal fluid. EZ - ellipsoid zone. ERM - epiretinal membrane. ORA - outer retinal atrophy. ORT - outer retinal tubulation. SRHM - subretinal hyper-reflective  material       Intravitreal Injection, Pharmacologic Agent - OD - Right Eye       Time Out 03/26/2022. 2:37 PM. Confirmed correct patient, procedure, site, and patient consented.   Anesthesia Topical anesthesia was used. Anesthetic medications included Lidocaine 2%, Proparacaine 0.5%.   Procedure Preparation included 5% betadine to ocular surface, eyelid speculum. A (32g) needle was used.   Injection: 6 mg faricimab-svoa 6 MG/0.05ML   Route: Intravitreal, Site:  Right Eye   NDC: [phone], Lot: [id number], Expiration date: 03/24/2024, Waste: 0 mL   Post-op Post injection exam found visual acuity of at least counting fingers. The patient tolerated the procedure well. There were no complications. The patient received written and verbal post procedure care education. Post injection medications were not given.            ASSESSMENT/PLAN:    ICD-10-CM   1. Exudative age-related macular degeneration of right eye with active choroidal neovascularization (HCC)  H35.3211 OCT, Retina - OU - Both Eyes    Intravitreal Injection, Pharmacologic Agent - OD - Right Eye    faricimab-svoa (VABYSMO) '6mg'$ /0.71m intravitreal injection    2. 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 Long-term use of Plaquenil  Z79.899     5. Pseudophakia of both eyes  Z96.1     6. Dry eyes  H04.123     7. Intermediate stage nonexudative age-related macular degeneration of both eyes  H35.3132      1. Exudative age related macular degeneration, OD  - conversion from nonexudative ARMD noted on 9.23.22 visit  - s/p IVA OD #1 (09.23.22), #2 (10.21.22), #3 (11.18.22), #4 (12.30.22), #5 (02.13.23), #6 (03.24.23), #7 (05.08.23), #8 (06.09.23) -- IVA resistance  - s/p IVE OD #1 (07.24.23), #2 (08.25.23), #3 (09.29.23), #4 (10.27.23) -- IVE resistance  - s/p IVV OD #1 (12.01.23), #2 (01.05.34) - BCVA at 20/50 -- stable - OCT shows OD: Persistent IRF / IRHM centrally -- slightly improved at 4 weeks - recommend IVV OD #3 today, 02.02.24 w/ f/u at 4 wks - pt wishes to be treated with IVV - RBA of procedure discussed, questions answered - Eylea informed consent obtained and signed, 07.24.23 - Avastin informed consent obtained and signed, 09.23.22 - Vabysmo informed consent obtained and signed, 12.01.23 - see procedure note - f/u 4 weeks -- DFE/OCT, possible injection   2. Age related macular  degeneration, non-exudative OS  - intermediate stage-no change from previous visit - The incidence, anatomy, and pathology of dry AMD, risk of progression, and the AREDS and AREDS 2 study including smoking risks discussed with patient.   - recommend Amsler grid monitoring  3. Iris/ciliary body mass OS - pigmented lesion behind iris but anterior to PCIOL at 0730 and 0300 - 0730 lesion spans 0630 to 0830 behind dilated iris and larger than 0300 lesion  - patient asymptomatic - 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 Plaquenil use for RA  - baseline testing done 05.20.20 -- started plaquenil early May 2020 f or RA - HVF 10-2 showed non-specific defects OU -- essentially normal baseline test - OCT shows no obvious plaquenil-related changes, but interval development of exudative ARMD OD as above  - currently taking '200mg'$  / day per Dr. SAudelia Hivesinstructions --> 3.29 mg/kg body wt per day  - po prednisone, currently as needed  which is about once a week - and is also now on '10mg'$  po MTX weekly + folic acid - the American Academy of Ophthalmology recommends dosing '5mg'$ /kg per day to reduce risk of retinal toxicity  - recommend consideration of using alternate medication for RA especially given co-morbidity of age-related macular degeneration OU  - managed by Dr. Dossie Der at [facility]  5. Pseudophakia OU - s/p CE/IOL w/ ?canaloplasty OU by Dr. Robyne Askew at [facility]  - doing well  - continue to monitor  6. History of Glaucoma   - not currently on gtts -- IOP 17,21 - ?underwent canaloplasty in conjunction with CEIOL at [facility]   - referred to and now under the expert care of Dr. Kathlen Mody -- initial Visit/consult 10.3.2019  7. Dry Eye Syndrome  - use AT's OU as needed  Ophthalmic Meds Ordered this visit:  Meds ordered this encounter  Medications   faricimab-svoa (VABYSMO) '6mg'$ /0.49m intravitreal injection      This document serves as a record of services personally performed by BGardiner Sleeper MD, PhD. It was created on their behalf by JJoetta MannersCOT, an ophthalmic technician. The creation of this record is the provider's dictation and/or activities during the visit.    Electronically signed by: JJoetta MannersCOT 02.01.244:01 PM  This document serves as a record of services personally performed by BGardiner Sleeper MD, PhD. It was created on their behalf by ASan Jetty BOwens Shark OA an ophthalmic technician. The creation of this record is the provider's dictation and/or activities during the visit.    Electronically signed by: ASan Jetty BOwens Shark OA '@TODAY'$ @ 4:01 PM  BGardiner Sleeper M.D., Ph.D. Diseases & Surgery of the Retina and VOwenton02/03/2022   I have reviewed the above documentation for accuracy and completeness, and I agree with the above. BGardiner Sleeper M.D., Ph.D. 03/26/22 4:03 PM   Abbreviations: M myopia (nearsighted); A astigmatism; H hyperopia (farsighted); P presbyopia; Mrx spectacle prescription;  CTL contact lenses; OD right eye; OS left eye; OU both eyes  XT exotropia; ET esotropia; PEK punctate epithelial keratitis; PEE punctate epithelial erosions; DES dry eye syndrome; MGD meibomian gland dysfunction; ATs artificial tears; PFAT's preservative free artificial tears; NBarnardnuclear sclerotic cataract; PSC posterior subcapsular cataract; ERM epi-retinal membrane; PVD posterior vitreous detachment; RD retinal detachment; DM diabetes mellitus; DR diabetic retinopathy; NPDR non-proliferative diabetic retinopathy; PDR proliferative diabetic retinopathy; CSME clinically significant macular edema; DME diabetic macular edema; dbh dot blot hemorrhages; CWS cotton wool spot; POAG primary open angle glaucoma; C/D cup-to-disc ratio; HVF humphrey visual field; GVF goldmann visual field; OCT optical coherence tomography; IOP intraocular pressure; BRVO  Branch retinal vein occlusion; CRVO central retinal vein occlusion; CRAO central retinal artery occlusion; BRAO branch retinal artery occlusion; RT retinal tear; SB scleral buckle; PPV pars plana vitrectomy; VH Vitreous hemorrhage; PRP panretinal laser photocoagulation; IVK intravitreal kenalog; VMT vitreomacular traction; MH Macular hole;  NVD neovascularization of the disc; NVE neovascularization elsewhere; AREDS age related eye disease study; ARMD age related macular degeneration; POAG primary open angle glaucoma; EBMD epithelial/anterior basement membrane dystrophy; ACIOL anterior chamber intraocular lens; IOL intraocular lens; PCIOL posterior chamber intraocular lens; Phaco/IOL phacoemulsification with intraocular lens placement; PSummitphotorefractive keratectomy; LASIK laser assisted in situ keratomileusis; HTN hypertension; DM diabetes mellitus; COPD chronic obstructive pulmonary disease

## 2022-03-26 ENCOUNTER — Encounter (INDEPENDENT_AMBULATORY_CARE_PROVIDER_SITE_OTHER): Payer: Self-pay | Admitting: Ophthalmology

## 2022-03-26 ENCOUNTER — Ambulatory Visit (INDEPENDENT_AMBULATORY_CARE_PROVIDER_SITE_OTHER): Payer: Medicare HMO | Admitting: Ophthalmology

## 2022-03-26 DIAGNOSIS — H353132 Nonexudative age-related macular degeneration, bilateral, intermediate dry stage: Secondary | ICD-10-CM

## 2022-03-26 DIAGNOSIS — H353211 Exudative age-related macular degeneration, right eye, with active choroidal neovascularization: Secondary | ICD-10-CM

## 2022-03-26 DIAGNOSIS — H353122 Nonexudative age-related macular degeneration, left eye, intermediate dry stage: Secondary | ICD-10-CM

## 2022-03-26 DIAGNOSIS — Z961 Presence of intraocular lens: Secondary | ICD-10-CM

## 2022-03-26 DIAGNOSIS — H04123 Dry eye syndrome of bilateral lacrimal glands: Secondary | ICD-10-CM

## 2022-03-26 DIAGNOSIS — D4989 Neoplasm of unspecified behavior of other specified sites: Secondary | ICD-10-CM

## 2022-03-26 DIAGNOSIS — Z79899 Other long term (current) drug therapy: Secondary | ICD-10-CM

## 2022-03-26 MED ORDER — FARICIMAB-SVOA 6 MG/0.05ML IZ SOLN
6.0000 mg | INTRAVITREAL | Status: AC | PRN
  Administered 2022-03-26: 6 mg via INTRAVITREAL

## 2022-04-05 ENCOUNTER — Ambulatory Visit (INDEPENDENT_AMBULATORY_CARE_PROVIDER_SITE_OTHER): Payer: Medicare HMO

## 2022-04-05 DIAGNOSIS — M810 Age-related osteoporosis without current pathological fracture: Secondary | ICD-10-CM

## 2022-04-06 ENCOUNTER — Ambulatory Visit (HOSPITAL_BASED_OUTPATIENT_CLINIC_OR_DEPARTMENT_OTHER): Payer: Medicare HMO | Admitting: Cardiology

## 2022-04-06 ENCOUNTER — Encounter (HOSPITAL_BASED_OUTPATIENT_CLINIC_OR_DEPARTMENT_OTHER): Payer: Self-pay | Admitting: Cardiology

## 2022-04-06 VITALS — BP 142/72 | HR 74 | Ht 60.0 in | Wt 136.2 lb

## 2022-04-06 DIAGNOSIS — Z712 Person consulting for explanation of examination or test findings: Secondary | ICD-10-CM | POA: Diagnosis not present

## 2022-04-06 DIAGNOSIS — R030 Elevated blood-pressure reading, without diagnosis of hypertension: Secondary | ICD-10-CM | POA: Diagnosis not present

## 2022-04-06 DIAGNOSIS — I350 Nonrheumatic aortic (valve) stenosis: Secondary | ICD-10-CM

## 2022-04-06 DIAGNOSIS — Z7189 Other specified counseling: Secondary | ICD-10-CM | POA: Diagnosis not present

## 2022-04-06 DIAGNOSIS — I6529 Occlusion and stenosis of unspecified carotid artery: Secondary | ICD-10-CM

## 2022-04-06 MED ORDER — DENOSUMAB 60 MG/ML ~~LOC~~ SOSY
60.0000 mg | PREFILLED_SYRINGE | Freq: Once | SUBCUTANEOUS | Status: AC
  Administered 2022-04-05: 60 mg via SUBCUTANEOUS

## 2022-04-06 NOTE — Progress Notes (Signed)
Cardiology Office Note:    Date:  04/06/2022   ID:  [name], DOB 29-May-1929, MRN [id number]  PCP:  Ma Hillock, DO  Cardiologist:  Buford Dresser, MD  Referring MD: Ma Hillock, DO   CC: new patient evaluation for aortic stenosis  History of Present Illness:    Margaret [name] is a 89 y.o. female with a hx of PMR/RA who is seen as a new consult at the request of [place], DO for the evaluation and management of aortic stenosis.  Reviewed most recent note from Dr. Raoul Pitch from 12/31/21. Noted a murmur at that visit, echo done, showed moderate to severe aortic stenosis.  Today: Here with her daughter today. Here to review the results of her echo.  Walks the dog every morning, has a steep uphill climb as part of it. Walks 3/4-1 mile in the AM and then walks again in the afternoon. Notes occasional dull feeling in her chest when she is walking uphill, but it does not stop her. She notes a long history of fainting with triggers, ever since childhood. Has fainted less as an adult, last episode July 2023 after cutting her leg on a wall and seeing the blood. Has had intermittent LE edema in the past, none recently, they are cautious about what socks she wears.   Rare palpitations as night. Has restless leg syndrome that wakes her at night.  Denies shortness of breath at rest or with normal exertion. No PND, orthopnea, LE edema or unexpected weight gain. No recent syncope.   Past Medical History:  Diagnosis Date   Allergy    Arthritis of left knee    Asthma    Carotid artery stenosis 10/17/2012   stable report in 2018- "moderate on right, minimal on left"   Carpal tunnel syndrome 2018   left.   Cervical radiculopathy 05/16/2015   DDD C4-C5, C5-C6, and C6-C7.  Facet arthropathy throughout cervical spine.  Normal foraminal narrowing secondary to uncovertebral  arthropathy is seen bilaterally at C3-C4 and C5/C6   Chicken pox    DDD (degenerative disc disease),  lumbar 04/26/2015   Mild levoscoliosis, right anterior listhesis of L4 and L5.  DDD at every level.  Facet arthropathy at multiple levels.   Deviated septum    Glaucoma    both eyes, mild opened angle   Heart murmur    History of colon polyps    History of fall    History of frequent urinary tract infections    klebs- pansensitive. c. 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    Pseudophakia of both eyes    PVD (peripheral vascular disease) ([place])    Rheumatic fever    89 years old    Syncope 05/17/2017   Thyroid disease    Urinary incontinence    Vertigo    had been prescribed antivert   Vitamin D deficiency     Past Surgical History:  Procedure Laterality Date   CARPAL TUNNEL RELEASE Bilateral 2019   CATARACT EXTRACTION Bilateral 1997   COLONOSCOPY  2010   NASAL SEPTUM SURGERY  2007   WRIST FRACTURE SURGERY Right 1996    Current Medications: Current Outpatient Medications on File Prior to Visit  Medication Sig   albuterol (VENTOLIN HFA) 108 (90 Base) MCG/ACT inhaler Inhale 2 puffs into the lungs every 6 (six) hours as needed for wheezing or shortness of breath.   CALCIUM PO Take 1 tablet by mouth 2 (two) times daily. Slow release   cycloSPORINE (RESTASIS) 0.05 % ophthalmic emulsion 1 drop 2 (two) times daily.   denosumab (PROLIA) 60 MG/ML SOSY injection as directed Subcutaneous   folic acid (FOLVITE) 0.5 MG tablet Take 0.5 mg by mouth daily.   lactase (LACTAID) 3000 units tablet Take 2 tablets (6,000 Units total) by mouth 3 (three) times daily as needed (lactose intolerance).   levothyroxine (SYNTHROID) 50 MCG tablet Take 1 tablet (50 mcg total) by  mouth daily before breakfast.   montelukast (SINGULAIR) 10 MG tablet Take 1 tablet (10 mg total) by mouth at bedtime.   Multiple Vitamins-Minerals (PRESERVISION AREDS 2) CAPS Take 1 capsule by mouth 2 (two) times daily.   polyvinyl alcohol (LIQUIFILM TEARS) 1.4 % ophthalmic solution Place 1 drop into both eyes as needed for dry eyes.   predniSONE (DELTASONE) 5 MG tablet Take 5 mg by mouth daily as needed (joint/muscle pain).   pregabalin (LYRICA) 25 MG capsule Take 1 capsule (25 mg total) by mouth at bedtime.   No current facility-administered medications on file prior to visit.     Allergies:   Augmentin [amoxicillin-pot clavulanate] and Lactose intolerance (gi)   Social History   Tobacco Use   Smoking status: Former    Packs/day: 0.50    Types: Cigarettes    Start date: 1952    Quit date: 1965    Years since quitting: 59.1   Smokeless tobacco: Never  Vaping Use   Vaping Use: Never used  Substance Use Topics   Alcohol use: Not Currently    Alcohol/week: 5.0 standard drinks of alcohol    Types: 5 Standard drinks or equivalent per week   Drug use: Never    Family History: family history includes Arthritis in her brother, father, and mother; Blindness in her maternal grandfather; Breast cancer in her daughter and paternal grandmother; COPD in her brother, mother, and paternal grandmother; Hearing loss in her father; Heart attack in her father; Heart disease in her brother and father; Liver cancer in her sister; Prostate cancer in her brother and father; Stroke in her maternal grandfather and maternal grandmother.  ROS:   Please see the history of present illness.  Additional pertinent ROS: Constitutional: Negative for chills, fever, night sweats, unintentional weight loss  HENT: Negative for ear pain and hearing loss.   Eyes: Negative for loss of vision and eye pain.  Respiratory: Negative for cough, sputum, wheezing.   Cardiovascular: See HPI. Gastrointestinal: Negative for  abdominal pain, melena, and hematochezia.  Genitourinary: Negative for dysuria and hematuria.  Musculoskeletal: Negative for falls and myalgias.  Skin: Negative for itching and rash.  Neurological: Negative for focal weakness, focal sensory changes and loss of consciousness.  Endo/Heme/Allergies: Does not bruise/bleed easily.     EKGs/Labs/Other Studies Reviewed:    The following studies were reviewed today: Echo 02/04/22 1. Left ventricular ejection fraction, by estimation, is 60 to 65%. The  left ventricle has normal function. The left ventricle has no regional  wall motion abnormalities. There is mild left ventricular hypertrophy.  Left ventricular diastolic parameters  are consistent with Grade II diastolic dysfunction (pseudonormalization).  Elevated  left atrial pressure.   2. Right ventricular systolic function is normal. The right ventricular  size is normal. There is normal pulmonary artery systolic pressure. The  estimated right ventricular systolic pressure is 0000000 mmHg.   3. The mitral valve is abnormal. Trivial mitral valve regurgitation. Mild  mitral stenosis. Severe mitral annular calcification.   4. The inferior vena cava is normal in size with greater than 50%  respiratory variability, suggesting right atrial pressure of 3 mmHg.   5. The aortic valve is calcified. There is severe calcifcation of the  aortic valve. Aortic valve regurgitation is not visualized. Moderate to  severe aortic valve stenosis. Moderate AS by gradients (Vmax 3.6 m/s, MG  30 mmHg), severe by AVA (0.8cm^2) and  DI (0.24)    EKG:  EKG is personally reviewed.   04/06/22: NSR at 74 bpm with PRWP  Recent Labs: 06/05/2021: BUN 16; Creatinine, Ser 0.70; Potassium 3.9; Sodium 138; TSH 3.05  Recent Lipid Panel    Component Value Date/Time   CHOL 217 (A) 05/19/2017 0000   HDL 68 05/19/2017 0000   LDLCALC 131 05/19/2017 0000   LDLDIRECT 128 (H) 09/05/2020 1501    Physical Exam:    VS:  BP (!)  142/72 (BP Location: Left Arm, Patient Position: Sitting, Cuff Size: Normal)   Pulse 74   Ht 5' (1.524 m)   Wt 136 lb 3.2 oz (61.8 kg)   BMI 26.60 kg/m     Wt Readings from Last 3 Encounters:  04/06/22 136 lb 3.2 oz (61.8 kg)  12/31/21 133 lb 3.2 oz (60.4 kg)  09/02/21 135 lb (61.2 kg)    GEN: Well nourished, well developed in no acute distress HEENT: Normal, moist mucous membranes NECK: No JVD CARDIAC: regular rhythm, normal S1 and S2, no rubs or gallops. 3/6 mid peaking harsh systolic ejection murmur at USB, 2/6 holosystolic murmur at LSb VASCULAR: Radial and DP pulses 2+ bilaterally. No carotid bruits RESPIRATORY:  Clear to auscultation without rales, wheezing or rhonchi  ABDOMEN: Soft, non-tender, non-distended MUSCULOSKELETAL:  Ambulates independently SKIN: Warm and dry, no edema NEUROLOGIC:  Alert and oriented x 3. No focal neuro deficits noted. PSYCHIATRIC:  Normal affect    ASSESSMENT:    1. Nonrheumatic aortic valve stenosis   2. Encounter to discuss test results   3. Elevated blood pressure reading   4. Counseling on health promotion and disease prevention    PLAN:    Aortic stenosis -reviewed echo results at length -remains active with minimal symptoms only at peak activity -spent extensive time counseling on symptoms that need to be relayed to me as well as red flag symptoms that need immediate medical attention -will follow every 6 mos for symptoms, with echo every 6 mos-14yrbased on symptoms  Elevated blood pressure reading without diagnosis of hypertension -monitor, would not overtreat given aortic stenosis and age  Cardiac risk counseling and prevention recommendations: -recommend heart healthy/Mediterranean diet, with whole grains, fruits, vegetable, fish, lean meats, nuts, and olive oil. Limit salt. -recommend moderate walking, 3-5 times/week for 30-50 minutes each session. Aim for at least 150 minutes.week. Goal should be pace of 3 miles/hours, or  walking 1.5 miles in 30 minutes -recommend avoidance of tobacco products. Avoid excess alcohol.  Plan for follow up: 6 mos or sooner as needed  BBuford Dresser MD, PhD, FCubaHMetlakatlaVascular at MNorthwood [facility] Centerat DSt[facility] C[address] SEast PrairieGLohman [place] [phone]  [id number]   Medication Adjustments/Labs and Tests Ordered: Current medicines are reviewed at length with the patient today.  Concerns regarding medicines are outlined above.  Orders Placed This Encounter  Procedures   EKG 12-Lead   No orders of the defined types were placed in this encounter.   Patient Instructions  Medication Instructions:  Your physician recommends that you continue on your current medications as directed. Please refer to the Current Medication list given to you today.  Labwork: NONE  Testing/Procedures: NONE  Follow-Up: 6 MONTHS WITH DR Harrell Gave   Any Other Special Instructions Will Be Listed Below (If Applicable).  I want to watch for chest heaviness, swelling/shortness of breath, or fainting.  If you need a refill on your cardiac medications before your next appointment, please call your pharmacy.   Signed, Buford Dresser, MD PhD 04/06/2022     [place]

## 2022-04-06 NOTE — Patient Instructions (Addendum)
Medication Instructions:  Your physician recommends that you continue on your current medications as directed. Please refer to the Current Medication list given to you today.  Labwork: NONE  Testing/Procedures: NONE  Follow-Up: 6 MONTHS WITH DR Harrell Gave   Any Other Special Instructions Will Be Listed Below (If Applicable).  I want to watch for chest heaviness, swelling/shortness of breath, or fainting.  If you need a refill on your cardiac medications before your next appointment, please call your pharmacy.

## 2022-04-08 NOTE — Progress Notes (Signed)
Margaret [name] is a 89 y.o. female presents to the office today for prolia injections, per physician's orders. Original order: January 2004 denosumab (PROLIA) 60 MG/ML SOSY injection was administered left arm (location) today. Patient tolerated injection. Patient due for follow up labs/provider appt: No. Date due: n/a, appt made No Patient next injection due: 6 months, appt made No  [name]

## 2022-04-22 NOTE — Progress Notes (Signed)
[facility] Note  04/23/2022    CHIEF COMPLAINT Patient presents for Retina Follow Up   HISTORY OF PRESENT ILLNESS: Margaret [name] is a 89 y.o. female who presents to the clinic today for:   HPI     Retina Follow Up   Patient presents with  Wet AMD.  In right eye.  Severity is moderate.  Duration of 4 weeks.  Since onset it is stable.  I, the attending physician,  performed the HPI with the patient and updated documentation appropriately.        Comments   Pt here for 4 wk ret f/u exu ARMD OD. Pt states my VA may have improved, some days better than others.       Last edited by Bernarda Caffey, MD on 04/23/2022  2:26 PM.    Pt states vision is "fair"   Referring physician: Ma Hillock, DO [address] [place],  [place] [address]  HISTORICAL INFORMATION:  Selected notes from the MEDICAL RECORD NUMBER Self referral for macular degeneration LEE: 03.21.19 (Dr. Park Liter in [place], [place]) [BCVA: OD: 20/40 OS: 20/40] Ocular Hx-POAG, non-exu ARMD, DES, pseudo, YAG PMH-astham, arthritis,    CURRENT MEDICATIONS: Current Outpatient Medications (Ophthalmic Drugs)  Medication Sig   cycloSPORINE (RESTASIS) 0.05 % ophthalmic emulsion 1 drop 2 (two) times daily.   polyvinyl alcohol (LIQUIFILM TEARS) 1.4 % ophthalmic solution Place 1 drop into both eyes as needed for dry eyes.   No current facility-administered medications for this visit. (Ophthalmic Drugs)   Current Outpatient Medications (Other)  Medication Sig   albuterol (VENTOLIN HFA) 108 (90 Base) MCG/ACT inhaler Inhale 2 puffs into the lungs every 6 (six) hours as needed for wheezing or shortness of breath.   CALCIUM PO Take 1 tablet by mouth 2 (two) times daily. Slow release   denosumab (PROLIA) 60 MG/ML SOSY injection as directed Subcutaneous   folic acid (FOLVITE) 0.5 MG tablet Take 0.5 mg by mouth daily.   lactase (LACTAID) 3000 units tablet Take 2 tablets (6,000 Units total) by mouth 3 (three) times  daily as needed (lactose intolerance).   levothyroxine (SYNTHROID) 50 MCG tablet Take 1 tablet (50 mcg total) by mouth daily before breakfast.   montelukast (SINGULAIR) 10 MG tablet Take 1 tablet (10 mg total) by mouth at bedtime.   Multiple Vitamins-Minerals (PRESERVISION AREDS 2) CAPS Take 1 capsule by mouth 2 (two) times daily.   predniSONE (DELTASONE) 5 MG tablet Take 5 mg by mouth daily as needed (joint/muscle pain).   pregabalin (LYRICA) 25 MG capsule Take 1 capsule (25 mg total) by mouth at bedtime.   No current facility-administered medications for this visit. (Other)   REVIEW OF SYSTEMS: ROS   Positive for: Musculoskeletal, Endocrine, Eyes Negative for: Constitutional, Gastrointestinal, Neurological, Skin, Genitourinary, HENT, Cardiovascular, Respiratory, Psychiatric, Allergic/Imm, Heme/Lymph Last edited by Kingsley Spittle, COT on 04/23/2022 12:42 PM.      ALLERGIES Allergies  Allergen Reactions   Augmentin [Amoxicillin-Pot Clavulanate] Swelling and Rash    Swelling in face   Lactose Intolerance (Gi)    PAST MEDICAL HISTORY Past Medical History:  Diagnosis Date   Allergy    Arthritis of left knee    Asthma    Carotid artery stenosis 10/17/2012   stable report in 2018- "moderate on right, minimal on left"   Carpal tunnel syndrome 2018   left.   Cervical radiculopathy 05/16/2015   DDD C4-C5, C5-C6, and C6-C7.  Facet arthropathy throughout cervical spine.  Normal foraminal narrowing secondary to uncovertebral  arthropathy is seen bilaterally at C3-C4 and C5/C6   Chicken pox    DDD (degenerative disc disease), lumbar 04/26/2015   Mild levoscoliosis, right anterior listhesis of L4 and L5.  DDD at every level.  Facet arthropathy at multiple levels.   Deviated septum    Glaucoma    both eyes, mild opened angle   Heart murmur    History of colon polyps    History of fall    History of frequent urinary tract infections    klebs- pansensitive. c. freundii - resitant to  cefazolin and augementin   Hypothyroidism    Intra-abdominal abscess ([place]) 03/10/2020   Iron deficiency anemia    prior pcp told her to take Fe 325 QD   Macular degeneration    h/o retinal edema   Mitral valve prolapse 08/27/2011   Murmur, cardiac 10/17/2012   Osteoarthritis    Osteoporosis    Pneumonia    Positive TB test    In college    Pseudophakia of both eyes    PVD (peripheral vascular disease) ([place])    Rheumatic fever    89 years old    Syncope 05/17/2017   Thyroid disease    Urinary incontinence    Vertigo    had been prescribed antivert   Vitamin D deficiency    Past Surgical History:  Procedure Laterality Date   CARPAL TUNNEL RELEASE Bilateral 2019   CATARACT EXTRACTION Bilateral 1997   COLONOSCOPY  2010   NASAL SEPTUM SURGERY  2007   WRIST FRACTURE SURGERY Right 1996   FAMILY HISTORY Family History  Problem Relation Age of Onset   Arthritis Mother    COPD Mother    Prostate cancer Father    Arthritis Father    Hearing loss Father    Heart disease Father    Heart attack Father    Stroke Maternal Grandmother    Liver cancer Sister    Arthritis Brother    Prostate cancer Brother    COPD Brother    Heart disease Brother    Breast cancer Daughter    Blindness Maternal Grandfather    Stroke Maternal Grandfather    COPD Paternal Grandmother    Breast cancer Paternal Grandmother    SOCIAL HISTORY Social History   Tobacco Use   Smoking status: Former    Packs/day: 0.50    Types: Cigarettes    Start date: 1952    Quit date: 1965    Years since quitting: 59.2   Smokeless tobacco: Never  Vaping Use   Vaping Use: Never used  Substance Use Topics   Alcohol use: Not Currently    Alcohol/week: 5.0 standard drinks of alcohol    Types: 5 Standard drinks or equivalent per week   Drug use: Never       OPHTHALMIC EXAM:  Base Eye Exam     Visual Acuity (Snellen - Linear)       Right Left   Dist cc 20/50 -1 20/30   Dist ph cc NI NI     Correction: Glasses         Tonometry (Tonopen, 12:49 PM)       Right Left   Pressure 15 12         Pupils       Pupils Dark Light Shape React APD   Right PERRL 3 2 Round Brisk None   Left PERRL 3 2 Round Brisk None  Visual Fields (Counting fingers)       Left Right    Full Full         Extraocular Movement       Right Left    Full, Ortho Full, Ortho         Neuro/Psych     Oriented x3: Yes   Mood/Affect: Normal         Dilation     Both eyes: 1.0% Mydriacyl, 2.5% Phenylephrine @ 12:50 PM           Slit Lamp and Fundus Exam     External Exam       Right Left   External Normal Normal         Slit Lamp Exam       Right Left   Lids/Lashes Dermatochalasis - upper lid, Telangiectasia Dermatochalasis - upper lid, mild Meibomian gland dysfunction   Conjunctiva/Sclera Temporal Pinguecula White and quiet   Cornea Arcus, 1+ Punctate epithelial erosions, mild tear film debris, fine endo pigment Arcus, 1-2+ inferior Punctate epithelial erosions, mild tear film debris, fine endo pigment   Anterior Chamber Deep and quiet, narrow temporal angle Deep and quiet   Iris Round and dilated Round and dilated, pigmented mass at 0200 and 0730 behind iris, anterior to IOL -- ?regression of 0200 lesion   Lens PC IOL in good position with open PC PC IOL in good position with open PC   Anterior Vitreous Vitreous syneresis Vitreous syneresis, Posterior vitreous detachment         Fundus Exam       Right Left   Disc 360 Peripapillary atrophy, Sharp rim, 2+ Pallor sharp rim, temporal PPA, mild Pallor   C/D Ratio 0.4 0.3   Macula Flat, Blunted foveal reflex, drusen, RPE mottling and clumping, +central IRF / cystic changes -- slightly improved, punctate IRH -- stably resolved, no SRH, early RPE atrophy Flat, Blunted foveal reflex, drusen, RPE mottling and clumping, no heme or edema   Vessels attenuated, Tortuous attenuated, Tortuous   Periphery Attached,  scattered Reticular degeneration, pigmented cystoid degeneration temporally, No heme Attached, scattered Reticular degeneration, No heme           Refraction     Wearing Rx       Sphere Cylinder Axis Add   Right -0.75 +2.25 175 +2.75   Left -0.25 +1.25 165 +2.75           IMAGING AND PROCEDURES  Imaging and Procedures for '@TODAY'$ @  OCT, Retina - OU - Both Eyes       Right Eye Quality was good. Central Foveal Thickness: 310. Progression has improved. Findings include no SRF, abnormal foveal contour, retinal drusen , intraretinal hyper-reflective material, epiretinal membrane, intraretinal fluid, outer retinal atrophy (Persistent IRF / IRHM centrally -- slightly improved, Partial PVD).   Left Eye Quality was good. Central Foveal Thickness: 299. Progression has been stable. Findings include normal foveal contour, no IRF, no SRF, retinal drusen , outer retinal atrophy (Trace ERM, Partial PVD).   Notes *Images captured and stored on drive  Diagnosis / Impression:  OD: Exudative ARMD - Persistent IRF / IRHM centrally -- slightly improved OS: Nonexudative ARMD OS Partial PVD OU  Clinical management:  See below  Abbreviations: NFP - Normal foveal profile. CME - cystoid macular edema. PED - pigment epithelial detachment. IRF - intraretinal fluid. SRF - subretinal fluid. EZ - ellipsoid zone. ERM - epiretinal membrane. ORA - outer retinal atrophy. ORT - outer retinal tubulation. SRHM -  subretinal hyper-reflective material       Intravitreal Injection, Pharmacologic Agent - OD - Right Eye       Time Out 04/23/2022. 1:18 PM. Confirmed correct patient, procedure, site, and patient consented.   Anesthesia Topical anesthesia was used. Anesthetic medications included Lidocaine 2%, Proparacaine 0.5%.   Procedure Preparation included 5% betadine to ocular surface, eyelid speculum. A (32g) needle was used.   Injection: 6 mg faricimab-svoa 6 MG/0.05ML   Route: Intravitreal,  Site: Right Eye   NDC: [id number], Lot: [id number], Expiration date: 03/24/2024, Waste: 0 mL   Post-op Post injection exam found visual acuity of at least counting fingers. The patient tolerated the procedure well. There were no complications. The patient received written and verbal post procedure care education. Post injection medications were not given.             ASSESSMENT/PLAN:    ICD-10-CM   1. Exudative age-related macular degeneration of right eye with active choroidal neovascularization (HCC)  H35.3211 OCT, Retina - OU - Both Eyes    Intravitreal Injection, Pharmacologic Agent - OD - Right Eye    faricimab-svoa (VABYSMO) '6mg'$ /0.58m intravitreal injection    2. 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 Long-term use of Plaquenil  Z79.899     5. Pseudophakia of both eyes  Z96.1     6. Dry eyes  H04.123      1. Exudative age related macular degeneration, OD  - conversion from nonexudative ARMD noted on 9.23.22 visit  - s/p IVA OD #1 (09.23.22), #2 (10.21.22), #3 (11.18.22), #4 (12.30.22), #5 (02.13.23), #6 (03.24.23), #7 (05.08.23), #8 (06.09.23) -- IVA resistance  - s/p IVE OD #1 (07.24.23), #2 (08.25.23), #3 (09.29.23), #4 (10.27.23) -- IVE resistance  - s/p IVV OD #1 (12.01.23), #2 (01.05.34), #3 (02.02.24) - BCVA at 20/50 -- stable - OCT shows OD: Persistent IRF / IRHM centrally -- slightly improved at 4 weeks - recommend IVV OD #4 today, 03.01.24 w/ f/u at 4 wks - pt wishes to be treated with IVV - RBA of procedure discussed, questions answered - Eylea informed consent obtained and signed, 07.24.23 - Avastin informed consent obtained and signed, 09.23.22 - Vabysmo informed consent obtained and signed, 12.01.23 - see procedure note - f/u 4 weeks -- DFE/OCT, possible injection   2. Age related macular degeneration, non-exudative OS  - intermediate stage-no change from previous  visit - The incidence, anatomy, and pathology of dry AMD, risk of progression, and the AREDS and AREDS 2 study including smoking risks discussed with patient.   - recommend Amsler grid monitoring  3. Iris/ciliary body mass OS - pigmented lesion behind iris but anterior to PCIOL at 0730 and 0300 - 0730 lesion spans 0630 to 0830 behind dilated iris and larger than 0300 lesion  - patient asymptomatic - pt [facility] Dr[facility]22 who dx her with iris margin cyst  4. Plaquenil use for RA  - baseline testing done 05.20.20 -- started plaquenil early May 2020 f or RA - HVF 10-2 showed non-specific defects OU -- essentially normal baseline test - OCT shows no obvious plaquenil-related changes, but interval development of exudative ARMD OD as above  - currently taking '200mg'$  / day per Dr. SAudelia Hivesinstructions --> 3.29 mg/kg body wt per day  - po prednisone, currently as needed which is about once a week - and is also now  on '10mg'$  po MTX weekly + folic acid - the American Academy of Ophthalmology recommends dosing '5mg'$ /kg per day to reduce risk of retinal toxicity  - recommend consideration of using alternate medication for RA especially given co-morbidity of age-related macular degeneration OU  - managed by [facility] Dossie [facility] at [facility]  5. Pseudophakia OU - s/p CE/IOL w/ ?canaloplasty OU by Dr. Robyne As[facility]w at [facility]  - doing well  - continue to monitor  6. History of Glaucoma   - not currently on gtts -- IOP 15,12 - ?underwent canaloplasty in conjunction with CEIOL at [facility] clinic   - referred to and now under the expert care of Dr. Kathlen Mody -- initial Visit/consult 10.3.2019  7. Dry Eye Syndrome  - use AT's OU as needed  Ophthalmic Meds Ordered this visit:  Meds ordered this encounter  Medications   faricimab-svoa (VABYSMO) '6mg'$ /0.34m intravitreal injection     This document serves as a record of services personally performed by BGardiner Sleeper MD, PhD. It was created on their behalf by ASan Jetty BOwens Shark OA an ophthalmic technician. The creation of this record is the provider's dictation and/or activities during the visit.    Electronically signed by: ASan Jetty BOwens Shark O[place]02.29.2024 9:56 PM  BGardiner Sleeper M.D., Ph.D. Diseases & Surgery of the Retina and Vitreous [place] I have reviewed the above documentation for accuracy and completeness, and I agree with the above. BGardiner Sleeper M.D., Ph.D. 04/26/22 10:05 PM   Abbreviations: M myopia (nearsighted); A astigmatism; H hyperopia (farsighted); P presbyopia; Mrx spectacle prescription;  CTL contact lenses; OD right eye; OS left eye; OU both eyes  XT exotropia; ET esotropia; PEK punctate epithelial keratitis; PEE punctate epithelial erosions; DES dry eye syndrome; MGD meibomian gland dysfunction; ATs artificial tears; PFAT's preservative free artificial tears; [place] Crossroadsnuclear sclerotic cataract; PSC posterior subcapsular cataract; ERM epi-retinal membrane; PVD posterior vitreous detachment; RD retinal detachment; DM diabetes mellitus; DR diabetic retinopathy; NPDR non-proliferative diabetic retinopathy; PDR proliferative diabetic retinopathy; CSME clinically significant macular edema; DME diabetic macular edema; dbh dot blot hemorrhages; CWS cotton wool spot; POAG primary open angle glaucoma; C/D cup-to-disc ratio; HVF humphrey visual field; GVF goldmann visual field; OCT optical coherence tomography; IOP intraocular pressure; BRVO Branch retinal vein occlusion; CRVO central retinal vein occlusion; CRAO central retinal artery occlusion; BRAO branch retinal artery occlusion; RT retinal tear; SB scleral buckle; PPV pars plana vitrectomy; VH Vitreous hemorrhage; PRP panretinal laser photocoagulation; IVK intravitreal kenalog; VMT vitreomacular traction; MH Macular hole;  NVD neovascularization of the disc; NVE neovascularization elsewhere; AREDS age related eye  disease study; ARMD age related macular degeneration; POAG primary open angle glaucoma; EBMD epithelial/anterior basement membrane dystrophy; ACIOL anterior chamber intraocular lens; IOL intraocular lens; PCIOL posterior chamber intraocular lens; Phaco/IOL phacoemulsification with intraocular lens placement; PLucedalephotorefractive keratectomy; LASIK laser assisted in situ keratomileusis; HTN hypertension; DM diabetes mellitus; COPD chronic obstructive pulmonary disease

## 2022-04-23 ENCOUNTER — Ambulatory Visit (INDEPENDENT_AMBULATORY_CARE_PROVIDER_SITE_OTHER): Payer: Medicare HMO | Admitting: Ophthalmology

## 2022-04-23 ENCOUNTER — Encounter (INDEPENDENT_AMBULATORY_CARE_PROVIDER_SITE_OTHER): Payer: Medicare HMO | Admitting: Ophthalmology

## 2022-04-23 ENCOUNTER — Encounter (INDEPENDENT_AMBULATORY_CARE_PROVIDER_SITE_OTHER): Payer: Self-pay | Admitting: Ophthalmology

## 2022-04-23 DIAGNOSIS — H04123 Dry eye syndrome of bilateral lacrimal glands: Secondary | ICD-10-CM

## 2022-04-23 DIAGNOSIS — H353122 Nonexudative age-related macular degeneration, left eye, intermediate dry stage: Secondary | ICD-10-CM

## 2022-04-23 DIAGNOSIS — Z961 Presence of intraocular lens: Secondary | ICD-10-CM

## 2022-04-23 DIAGNOSIS — H353211 Exudative age-related macular degeneration, right eye, with active choroidal neovascularization: Secondary | ICD-10-CM

## 2022-04-23 DIAGNOSIS — Z79899 Other long term (current) drug therapy: Secondary | ICD-10-CM

## 2022-04-23 DIAGNOSIS — D4989 Neoplasm of unspecified behavior of other specified sites: Secondary | ICD-10-CM

## 2022-04-23 MED ORDER — FARICIMAB-SVOA 6 MG/0.05ML IZ SOLN
6.0000 mg | INTRAVITREAL | Status: AC | PRN
  Administered 2022-04-23: 6 mg via INTRAVITREAL

## 2022-04-30 ENCOUNTER — Encounter (HOSPITAL_BASED_OUTPATIENT_CLINIC_OR_DEPARTMENT_OTHER): Payer: Self-pay | Admitting: Cardiology

## 2022-05-19 NOTE — Progress Notes (Signed)
[facility] Note  05/21/2022    CHIEF COMPLAINT Patient presents for Retina Follow Up   HISTORY OF PRESENT ILLNESS: Margaret [name] is a 89 y.o. female who presents to the clinic today for:   HPI     Retina Follow Up   Patient presents with  Wet AMD.  In both eyes.  This started 4 weeks ago.  Duration of 4 weeks.  Since onset it is stable.  I, the attending physician,  performed the HPI with the patient and updated documentation appropriately.        Comments   4 week retina follow up ARMD OD and IVV OD pt is reporting her vision has been little off maybe allergies she is having itching burning and watering she is using At's as needed       Last edited by Bernarda Caffey, MD on 05/21/2022  2:07 PM.     Pt states   Referring physician: Ma Hillock, DO [address] [place],  [place] [address]  HISTORICAL INFORMATION:  Selected notes from the MEDICAL RECORD NUMBER Self referral for macular degeneration LEE: 03.21.19 (Dr. Park Liter in [place], [place]) [BCVA: OD: 20/40 OS: 20/40] Ocular Hx-POAG, non-exu ARMD, DES, pseudo, YAG PMH-astham, arthritis,    CURRENT MEDICATIONS: Current Outpatient Medications (Ophthalmic Drugs)  Medication Sig   cycloSPORINE (RESTASIS) 0.05 % ophthalmic emulsion 1 drop 2 (two) times daily.   polyvinyl alcohol (LIQUIFILM TEARS) 1.4 % ophthalmic solution Place 1 drop into both eyes as needed for dry eyes.   No current facility-administered medications for this visit. (Ophthalmic Drugs)   Current Outpatient Medications (Other)  Medication Sig   albuterol (VENTOLIN HFA) 108 (90 Base) MCG/ACT inhaler Inhale 2 puffs into the lungs every 6 (six) hours as needed for wheezing or shortness of breath.   CALCIUM PO Take 1 tablet by mouth 2 (two) times daily. Slow release   denosumab (PROLIA) 60 MG/ML SOSY injection as directed Subcutaneous   folic acid (FOLVITE) 0.5 MG tablet Take 0.5 mg by mouth daily.   lactase (LACTAID) 3000 units  tablet Take 2 tablets (6,000 Units total) by mouth 3 (three) times daily as needed (lactose intolerance).   levothyroxine (SYNTHROID) 50 MCG tablet Take 1 tablet (50 mcg total) by mouth daily before breakfast.   montelukast (SINGULAIR) 10 MG tablet Take 1 tablet (10 mg total) by mouth at bedtime.   Multiple Vitamins-Minerals (PRESERVISION AREDS 2) CAPS Take 1 capsule by mouth 2 (two) times daily.   predniSONE (DELTASONE) 5 MG tablet Take 5 mg by mouth daily as needed (joint/muscle pain).   pregabalin (LYRICA) 25 MG capsule Take 1 capsule (25 mg total) by mouth at bedtime.   No current facility-administered medications for this visit. (Other)   REVIEW OF SYSTEMS: ROS   Positive for: Musculoskeletal, Endocrine, Eyes Negative for: Constitutional, Gastrointestinal, Neurological, Skin, Genitourinary, HENT, Cardiovascular, Respiratory, Psychiatric, Allergic/Imm, Heme/Lymph Last edited by Parthenia Ames, COT on 05/21/2022  1:31 PM.       ALLERGIES Allergies  Allergen Reactions   Augmentin [Amoxicillin-Pot Clavulanate] Swelling and Rash    Swelling in face   Lactose Intolerance (Gi)    PAST MEDICAL HISTORY Past Medical History:  Diagnosis Date   Allergy    Arthritis of left knee    Asthma    Carotid artery stenosis 10/17/2012   stable report in 2018- "moderate on right, minimal on left"   Carpal tunnel syndrome 2018   left.   Cervical radiculopathy 05/16/2015  DDD C4-C5, C5-C6, and C6-C7.  Facet arthropathy throughout cervical spine.  Normal foraminal narrowing secondary to uncovertebral  arthropathy is seen bilaterally at C3-C4 and C5/C6   Chicken pox    DDD (degenerative disc disease), lumbar 04/26/2015   Mild levoscoliosis, right anterior listhesis of L4 and L5.  DDD at every level.  Facet arthropathy at multiple levels.   Deviated septum    Glaucoma    both eyes, mild opened angle   Heart murmur    History of colon polyps    History of fall    History of frequent  urinary tract infections    klebs- pansensitive. c. freundii - resitant to cefazolin and augementin   Hypothyroidism    Intra-abdominal abscess ([place]) 03/10/2020   Iron deficiency anemia    prior pcp told her to take Fe 325 QD   Macular degeneration    h/o retinal edema   Mitral valve prolapse 08/27/2011   Murmur, cardiac 10/17/2012   Osteoarthritis    Osteoporosis    Pneumonia    Positive TB test    In college    Pseudophakia of both eyes    PVD (peripheral vascular disease) ([place])    Rheumatic fever    89 years old    Syncope 05/17/2017   Thyroid disease    Urinary incontinence    Vertigo    had been prescribed antivert   Vitamin D deficiency    Past Surgical History:  Procedure Laterality Date   CARPAL TUNNEL RELEASE Bilateral 2019   CATARACT EXTRACTION Bilateral 1997   COLONOSCOPY  2010   NASAL SEPTUM SURGERY  2007   WRIST FRACTURE SURGERY Right 1996   FAMILY HISTORY Family History  Problem Relation Age of Onset   Arthritis Mother    COPD Mother    Prostate cancer Father    Arthritis Father    Hearing loss Father    Heart disease Father    Heart attack Father    Stroke Maternal Grandmother    Liver cancer Sister    Arthritis Brother    Prostate cancer Brother    COPD Brother    Heart disease Brother    Breast cancer Daughter    Blindness Maternal Grandfather    Stroke Maternal Grandfather    COPD Paternal Grandmother    Breast cancer Paternal Grandmother    SOCIAL HISTORY Social History   Tobacco Use   Smoking status: Former    Packs/day: .5    Types: Cigarettes    Start date: 1952    Quit date: 1965    Years since quitting: 59.2   Smokeless tobacco: Never  Vaping Use   Vaping Use: Never used  Substance Use Topics   Alcohol use: Not Currently    Alcohol/week: 5.0 standard drinks of alcohol    Types: 5 Standard drinks or equivalent per week   Drug use: Never       OPHTHALMIC EXAM:  Base Eye Exam     Visual Acuity (Snellen - Linear)        Right Left   Dist cc 20/60 +1 20/30   Dist ph cc 20/50 -2 NI    Correction: Glasses         Tonometry (Tonopen, 1:34 PM)       Right Left   Pressure 14 17         Pupils       Pupils Dark Light Shape React APD   Right PERRL 3 2 Round Brisk None  Left PERRL 3 2 Round Brisk None         Visual Fields       Left Right    Full Full         Extraocular Movement       Right Left    Full, Ortho Full, Ortho         Neuro/Psych     Oriented x3: Yes   Mood/Affect: Normal         Dilation     Both eyes: 2.5% Phenylephrine @ 1:34 PM           Slit Lamp and Fundus Exam     External Exam       Right Left   External Normal Normal         Slit Lamp Exam       Right Left   Lids/Lashes Dermatochalasis - upper lid, Telangiectasia Dermatochalasis - upper lid, mild Meibomian gland dysfunction   Conjunctiva/Sclera Temporal Pinguecula White and quiet   Cornea Arcus, 1+ Punctate epithelial erosions, mild tear film debris, fine endo pigment Arcus, 1-2+ inferior Punctate epithelial erosions, mild tear film debris, fine endo pigment   Anterior Chamber Deep and quiet, narrow temporal angle Deep and quiet   Iris Round and dilated Round and dilated, pigmented mass at 0200 and 0730 behind iris, anterior to IOL -- ?regression of 0200 lesion   Lens PC IOL in good position with open PC PC IOL in good position with open PC   Anterior Vitreous Vitreous syneresis Vitreous syneresis, Posterior vitreous detachment         Fundus Exam       Right Left   Disc 360 Peripapillary atrophy, Sharp rim, 2+ Pallor sharp rim, temporal PPA, mild Pallor   C/D Ratio 0.4 0.3   Macula Flat, Blunted foveal reflex, drusen, RPE mottling and clumping, +central IRF / cystic changes -- slightly improved, no heme, early RPE atrophy Flat, Blunted foveal reflex, drusen, RPE mottling and clumping, no heme or edema   Vessels attenuated, Tortuous attenuated, Tortuous   Periphery  Attached, scattered Reticular degeneration, pigmented cystoid degeneration temporally, No heme Attached, scattered Reticular degeneration, No heme           Refraction     Wearing Rx       Sphere Cylinder Axis Add   Right -0.75 +2.25 175 +2.75   Left -0.25 +1.25 165 +2.75           IMAGING AND PROCEDURES  Imaging and Procedures for @TODAY @  OCT, Retina - OU - Both Eyes       Right Eye Quality was good. Central Foveal Thickness: 310. Progression has improved. Findings include no SRF, abnormal foveal contour, retinal drusen , intraretinal hyper-reflective material, epiretinal membrane, intraretinal fluid, outer retinal atrophy (Mild interval improvement in central cystic changes, Partial PVD).   Left Eye Quality was good. Central Foveal Thickness: 297. Progression has been stable. Findings include normal foveal contour, no IRF, no SRF, retinal drusen , outer retinal atrophy (Trace ERM, Partial PVD).   Notes *Images captured and stored on drive  Diagnosis / Impression:  OD: Exudative ARMD - Mild interval improvement in central cystic changes, Partial PVD OS: Nonexudative ARMD OS Partial PVD OU  Clinical management:  See below  Abbreviations: NFP - Normal foveal profile. CME - cystoid macular edema. PED - pigment epithelial detachment. IRF - intraretinal fluid. SRF - subretinal fluid. EZ - ellipsoid zone. ERM - epiretinal membrane. ORA - outer retinal atrophy.  ORT - outer retinal tubulation. SRHM - subretinal hyper-reflective material       Intravitreal Injection, Pharmacologic Agent - OD - Right Eye       Time Out 05/21/2022. 1:55 PM. Confirmed correct patient, procedure, site, and patient consented.   Anesthesia Topical anesthesia was used. Anesthetic medications included Lidocaine 2%, Proparacaine 0.5%.   Procedure Preparation included 5% betadine to ocular surface, eyelid speculum. A (32g) needle was used.   Injection: 6 mg faricimab-svoa 6 MG/0.05ML    Route: Intravitreal, Site: Right Eye   NDC: [id number], LotTV:[id number], Expiration date: 04/21/2024, Waste: 0 mL   Post-op Post injection exam found visual acuity of at least counting fingers. The patient tolerated the procedure well. There were no complications. The patient received written and verbal post procedure care education. Post injection medications were not given.              ASSESSMENT/PLAN:    ICD-10-CM   1. Exudative age-related macular degeneration of right eye with active choroidal neovascularization (HCC)  H35.3211 OCT, Retina - OU - Both Eyes    Intravitreal Injection, Pharmacologic Agent - OD - Right Eye    faricimab-svoa (VABYSMO) 6mg /0.43mL intravitreal injection    2. 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 Long-term use of Plaquenil  Z79.899     5. Pseudophakia of both eyes  Z96.1     6. Dry eyes  H04.123      1. Exudative age related macular degeneration, OD  - conversion from nonexudative ARMD noted on 9.23.22 visit  - s/p IVA OD #1 (09.23.22), #2 (10.21.22), #3 (11.18.22), #4 (12.30.22), #5 (02.13.23), #6 (03.24.23), #7 (05.08.23), #8 (06.09.23) -- IVA resistance  - s/p IVE OD #1 (07.24.23), #2 (08.25.23), #3 (09.29.23), #4 (10.27.23) -- IVE resistance  - s/p IVV OD #1 (12.01.23), #2 (01.05.34), #3 (02.02.24), #4 (03.01.24) - BCVA at 20/50 -- stable - OCT shows OD: Mild interval improvement in central cystic changes, Partial PVD - recommend IVV OD #5 today, 03.29.24 w/ f/u at 4 wks - pt wishes to be treated with IVV - RBA of procedure discussed, questions answered - Eylea informed consent obtained and signed, 07.24.23 - Avastin informed consent obtained and signed, 09.23.22 - Vabysmo informed consent obtained and signed, 12.01.23 - see procedure note - f/u 4 weeks -- DFE/OCT, possible injection   2. Age related macular degeneration, non-exudative OS  -  intermediate stage-no change from previous visit - The incidence, anatomy, and pathology of dry AMD, risk of progression, and the AREDS and AREDS 2 study including smoking risks discussed with patient.   - recommend [place] grid monitoring  3. Iris/ciliary body mass OS - pigmented lesion behind iris but anterior to PCIOL at 0730 and 0300 - 0730 lesion spans 0630 to 0830 behind dilated iris and larger than 0300 lesion  - patient asymptomatic - pt saw Dr. Dow Adolph, [facility] on 11.01.22 who dx her with iris margin cyst  4. Plaquenil use for RA  - baseline testing done 05.20.20 -- started plaquenil early May 2020 f or RA - HVF 10-2 showed non-specific defects OU -- essentially normal baseline test - OCT shows no obvious plaquenil-related changes, but interval development of exudative ARMD OD as above  - currently taking 200mg  / day per Dr. Audelia Hives instructions --> 3.29 mg/kg body wt per day  - po prednisone, currently as needed which is about  once a week - and is also now on 10mg  po MTX weekly + folic acid - the American Academy of Ophthalmology recommends dosing 5mg /kg per day to reduce risk of retinal toxicity  - recommend consideration of using alternate medication for RA especially given co-morbidity of age-related macular degeneration OU  - managed by Dr[facility] Dossie Der at [facility]  5. Pseudophakia OU - s/p CE/IOL w/ ?canaloplasty OU by Dr. Robyne Askew at [facility]  - doing well  - continue to monitor  6. History of Glaucoma   - not currently on gtts -- IOP 14,17 - ?underwent canaloplasty in conjunction with CEIOL at [facility]   - referred to and now under the expert care of Dr. Kathlen Mody -- initial Visit/consult 10.3.2019  7. Dry Eye Syndrome  - use AT's OU as needed  Ophthalmic Meds Ordered this visit:  Meds ordered this encounter  Medications   faricimab-svoa (VABYSMO) 6mg /0.18mL intravitreal injection     This document serves as a  record of services personally performed by Gardiner Sleeper, MD, PhD. It was created on their behalf by San Jetty. Owens Shark, OA an ophthalmic technician. The creation of this record is the provider's dictation and/or activities during the visit.    Electronically signed by: San Jetty. Owens Shark, [place] 03.27.2024 2:09 PM   Gardiner Sleeper, M.D., Ph.D. Diseases & Surgery of the Retina and Vitreous [facility]  I have reviewed the above documentation for accuracy and completeness, and I agree with the above. Gardiner Sleeper, M.D., Ph.D. 05/21/22 2:09 PM    Abbreviations: M myopia (nearsighted); A astigmatism; H hyperopia (farsighted); P presbyopia; Mrx spectacle prescription;  CTL contact lenses; OD right eye; OS left eye; OU both eyes  XT exotropia; ET esotropia; PEK punctate epithelial keratitis; PEE punctate epithelial erosions; DES dry eye syndrome; MGD meibomian gland dysfunction; ATs artificial tears; PFAT's preservative free artificial tears; [place] nuclear sclerotic cataract; PSC posterior subcapsular cataract; ERM epi-retinal membrane; PVD posterior vitreous detachment; RD retinal detachment; DM diabetes mellitus; DR diabetic retinopathy; NPDR non-proliferative diabetic retinopathy; PDR proliferative diabetic retinopathy; CSME clinically significant macular edema; DME diabetic macular edema; dbh dot blot hemorrhages; CWS cotton wool spot; POAG primary open angle glaucoma; C/D cup-to-disc ratio; HVF humphrey visual field; GVF goldmann visual field; OCT optical coherence tomography; IOP intraocular pressure; BRVO Branch retinal vein occlusion; CRVO central retinal vein occlusion; CRAO central retinal artery occlusion; BRAO branch retinal artery occlusion; RT retinal tear; SB scleral buckle; PPV pars plana vitrectomy; VH Vitreous hemorrhage; PRP panretinal laser photocoagulation; IVK intravitreal kenalog; VMT vitreomacular traction; MH Macular hole;  NVD neovascularization of the disc; NVE  neovascularization elsewhere; AREDS age related eye disease study; ARMD age related macular degeneration; POAG primary open angle glaucoma; EBMD epithelial/anterior basement membrane dystrophy; ACIOL anterior chamber intraocular lens; IOL intraocular lens; PCIOL posterior chamber intraocular lens; Phaco/IOL phacoemulsification with intraocular lens placement; [place] photorefractive keratectomy; LASIK laser assisted in situ keratomileusis; HTN hypertension; DM diabetes mellitus; COPD chronic obstructive pulmonary disease

## 2022-05-21 ENCOUNTER — Encounter (INDEPENDENT_AMBULATORY_CARE_PROVIDER_SITE_OTHER): Payer: Self-pay | Admitting: Ophthalmology

## 2022-05-21 ENCOUNTER — Ambulatory Visit (INDEPENDENT_AMBULATORY_CARE_PROVIDER_SITE_OTHER): Payer: Medicare HMO | Admitting: Ophthalmology

## 2022-05-21 DIAGNOSIS — D4989 Neoplasm of unspecified behavior of other specified sites: Secondary | ICD-10-CM

## 2022-05-21 DIAGNOSIS — Z961 Presence of intraocular lens: Secondary | ICD-10-CM

## 2022-05-21 DIAGNOSIS — H353122 Nonexudative age-related macular degeneration, left eye, intermediate dry stage: Secondary | ICD-10-CM

## 2022-05-21 DIAGNOSIS — Z79899 Other long term (current) drug therapy: Secondary | ICD-10-CM | POA: Diagnosis not present

## 2022-05-21 DIAGNOSIS — H353211 Exudative age-related macular degeneration, right eye, with active choroidal neovascularization: Secondary | ICD-10-CM | POA: Diagnosis not present

## 2022-05-21 DIAGNOSIS — H04123 Dry eye syndrome of bilateral lacrimal glands: Secondary | ICD-10-CM

## 2022-05-21 MED ORDER — FARICIMAB-SVOA 6 MG/0.05ML IZ SOLN
6.0000 mg | INTRAVITREAL | Status: AC | PRN
  Administered 2022-05-21: 6 mg via INTRAVITREAL

## 2022-06-16 ENCOUNTER — Ambulatory Visit (INDEPENDENT_AMBULATORY_CARE_PROVIDER_SITE_OTHER): Payer: Medicare HMO | Admitting: Family Medicine

## 2022-06-16 ENCOUNTER — Encounter: Payer: Self-pay | Admitting: Family Medicine

## 2022-06-16 ENCOUNTER — Telehealth: Payer: Self-pay

## 2022-06-16 VITALS — BP 135/73 | HR 61 | Temp 97.9°F | Wt 135.8 lb

## 2022-06-16 DIAGNOSIS — E559 Vitamin D deficiency, unspecified: Secondary | ICD-10-CM | POA: Diagnosis not present

## 2022-06-16 DIAGNOSIS — M5412 Radiculopathy, cervical region: Secondary | ICD-10-CM

## 2022-06-16 DIAGNOSIS — E039 Hypothyroidism, unspecified: Secondary | ICD-10-CM

## 2022-06-16 DIAGNOSIS — I6529 Occlusion and stenosis of unspecified carotid artery: Secondary | ICD-10-CM | POA: Diagnosis not present

## 2022-06-16 DIAGNOSIS — Z79899 Other long term (current) drug therapy: Secondary | ICD-10-CM | POA: Diagnosis not present

## 2022-06-16 DIAGNOSIS — M810 Age-related osteoporosis without current pathological fracture: Secondary | ICD-10-CM

## 2022-06-16 DIAGNOSIS — M359 Systemic involvement of connective tissue, unspecified: Secondary | ICD-10-CM | POA: Diagnosis not present

## 2022-06-16 DIAGNOSIS — D508 Other iron deficiency anemias: Secondary | ICD-10-CM

## 2022-06-16 DIAGNOSIS — G4762 Sleep related leg cramps: Secondary | ICD-10-CM

## 2022-06-16 LAB — COMPREHENSIVE METABOLIC PANEL
ALT: 21 U/L (ref 0–35)
AST: 24 U/L (ref 0–37)
Albumin: 4.5 g/dL (ref 3.5–5.2)
Alkaline Phosphatase: 46 U/L (ref 39–117)
BUN: 16 mg/dL (ref 6–23)
CO2: 27 mEq/L (ref 19–32)
Calcium: 9.9 mg/dL (ref 8.4–10.5)
Chloride: 103 mEq/L (ref 96–112)
Creatinine, Ser: 0.77 mg/dL (ref 0.40–1.20)
GFR: 67.71 mL/min (ref >60.00)
Glucose, Bld: 94 mg/dL (ref 70–99)
Potassium: 3.9 mEq/L (ref 3.5–5.1)
Sodium: 141 mEq/L (ref 135–145)
Total Bilirubin: 0.9 mg/dL (ref 0.2–1.2)
Total Protein: 6.3 g/dL (ref 6.0–8.3)

## 2022-06-16 LAB — CBC
HCT: 40.2 % (ref 36.0–46.0)
Hemoglobin: 13.8 g/dL (ref 12.0–15.0)
MCHC: 34.3 g/dL (ref 30.0–36.0)
MCV: 92.2 fl (ref 78.0–100.0)
Platelets: 234 10*3/uL (ref 150.0–400.0)
RBC: 4.36 Mil/uL (ref 3.87–5.11)
RDW: 13.5 % (ref 11.5–15.5)
WBC: 9.9 10*3/uL (ref 4.0–10.5)

## 2022-06-16 LAB — VITAMIN D 25 HYDROXY (VIT D DEFICIENCY, FRACTURES): VITD: 117.97 ng/mL (ref 30.00–100.00)

## 2022-06-16 LAB — TSH: TSH: 3.04 u[IU]/mL (ref 0.35–5.50)

## 2022-06-16 LAB — LDL CHOLESTEROL, DIRECT: Direct LDL: 120 mg/dL

## 2022-06-16 MED ORDER — MONTELUKAST SODIUM 10 MG PO TABS: 10.0000 mg | ORAL_TABLET | Freq: Every day | ORAL | 1 refills | Status: DC

## 2022-06-16 MED ORDER — PREGABALIN 25 MG PO CAPS: 25.0000 mg | ORAL_CAPSULE | Freq: Every day | ORAL | 1 refills | Status: DC

## 2022-06-16 MED ORDER — ALBUTEROL SULFATE HFA 108 (90 BASE) MCG/ACT IN AERS: 2.0000 | INHALATION_SPRAY | Freq: Four times a day (QID) | RESPIRATORY_TRACT | 0 refills | Status: DC | PRN

## 2022-06-16 NOTE — Progress Notes (Signed)
Margaret [name] , 1929/05/14, 89 y.o., female MRN: [id number] Patient Care Team    Relationship Specialty Notifications Start End  [name], DO PCP - General Family Medicine  03/08/19   Jodelle Red, MD PCP - Cardiology Cardiology  04/30/22   Rennis Chris, MD Consulting Physician Ophthalmology  03/29/18   Grant Fontana, MD Consulting Physician Ophthalmology  03/29/18   Jacqlyn Krauss, MD Referring Physician Dermatology  08/02/19   Rossie Muskrat, MD  Rheumatology  08/02/19     Chief Complaint  Patient presents with   Hypothyroidism    CMC; pt is not fasting     Subjective: Pt presents for an OV for follow up in [facility] PMR/RA: Patient reports she is much improved from when she last saw provider.  She is established with rheumatology.  She is on low-dose prednisone as needed, and had been prescribed MTX and  Plaquenil at different times in the past. Prior note: Patient presents today to discuss her arthralgias and myalgias.  Symptoms are worsening.  She reports increased symptoms in her left shoulder her left hand and her left hip.  Her symptoms are bilateral, worse on the left side. Her CMP, CRP, ESR, TSH, CBC and Lyme titers were all normal. Her ANA was positive, 1:640 nuclear homogenous. CCP and RF normal.  Hypothyroidism, unspecified type Patient reports compliance with levothyroxine 50 mcg daily.    Nocturnal leg cramps/RLS Patient reports the leg cramps have improved with the use of Lyrica 25 mg QHS.  Would like to continue this medication if she feels it is working well Prior note: Patient reports he had forgotten about the Mirapex she had been on.  She would like to restart this medication.  She has been prescribed Mirapex 0.25 mg nightly by her prior PCP for nocturnal leg cramping.  She feels this has worked well for her. Vitamin D insufficiency: pt is supplementing.   Seasonal allergies/Mild intermittent asthma, unspecified whether  complicated Patient reports she has asthma and allergies.    Patient reports compliance with Singulair and her symptoms are well controlled.  She also uses Flonase and albuterol as needed.       06/16/2022   10:22 AM 10/24/2021    9:10 AM 12/09/2020    9:53 AM 10/22/2020    1:13 PM 03/19/2020   11:32 AM  Depression screen PHQ 2/9  Decreased Interest 0 0 0 0   Down, Depressed, Hopeless 0 0 0 0 0  PHQ - 2 Score 0 0 0 0 0    Allergies  Allergen Reactions   Augmentin [Amoxicillin-Pot Clavulanate] Swelling and Rash    Swelling in face   Lactose Intolerance (Gi)    Social History   Social History Narrative   Marital status/children/pets: Widowed.  Moved to [place] from [place] area) 2019.  She shares a home with 1 of her daughters.  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   Cervical radiculopathy 05/16/2015   DDD C4-C5, C5-C6, and C6-C7.  Facet arthropathy throughout cervical spine.  Normal foraminal narrowing secondary to uncovertebral  arthropathy is seen bilaterally at C3-C4 and C5/C6   Chicken pox    DDD (degenerative disc disease), lumbar 04/26/2015   Mild levoscoliosis, right anterior listhesis of L4 and L5.  DDD at every level.  Facet arthropathy at multiple levels.   Deviated septum    Glaucoma    both eyes, mild opened angle   Heart murmur    History of colon polyps    History of fall    History of frequent urinary tract infections    klebs- pansensitive. c. freundii - resitant to cefazolin and  augementin   Hypothyroidism    Intra-abdominal abscess 03/10/2020   Iron deficiency anemia    prior pcp told her to take Fe 325 QD   Macular degeneration    h/o retinal edema   Mitral valve prolapse 08/27/2011   Murmur, cardiac 10/17/2012   Osteoarthritis    Osteoporosis    Pneumonia    Positive TB test    In college    Pseudophakia of both eyes    PVD (peripheral vascular disease)    Rheumatic fever    89 years old    Syncope 05/17/2017   Thyroid disease    Urinary incontinence    Vertigo    had been prescribed antivert   Vitamin D deficiency    Past Surgical History:  Procedure Laterality Date   CARPAL TUNNEL RELEASE Bilateral 2019   CATARACT EXTRACTION Bilateral 1997   COLONOSCOPY  2010   NASAL SEPTUM SURGERY  2007   WRIST FRACTURE SURGERY Right 1996   Family History  Problem Relation Age of Onset   Arthritis Mother    COPD Mother    Prostate cancer Father    Arthritis Father    Hearing loss Father    Heart disease Father    Heart attack Father    Stroke Maternal Grandmother    Liver cancer Sister    Arthritis Brother    Prostate cancer Brother    COPD Brother    Heart disease Brother    Breast cancer Daughter    Blindness Maternal Grandfather    Stroke Maternal Grandfather    COPD Paternal Grandmother    Breast cancer Paternal Grandmother    Allergies as of 06/16/2022       Reactions   Augmentin [amoxicillin-pot Clavulanate] Swelling, Rash   Swelling in face   Lactose Intolerance (gi)         Medication List        Accurate as of June 16, 2022 10:45 AM. If you have any questions, ask your nurse or doctor.          albuterol 108 (90 Base) MCG/ACT inhaler Commonly known as: VENTOLIN HFA Inhale 2 puffs into the lungs every 6 (six) hours as needed for wheezing or shortness of breath.   CALCIUM PO Take 1 tablet by mouth 2 (two) times daily. Slow release   cycloSPORINE 0.05 % ophthalmic emulsion Commonly known as: RESTASIS 1 drop 2 (two)  times daily.   folic acid 0.5 MG tablet Commonly known as: FOLVITE Take 0.5 mg by mouth daily.   lactase 3000 units tablet Commonly known as: LACTAID Take 2 tablets (6,000 Units total) by mouth 3 (three) times daily as needed (lactose intolerance).   levothyroxine  50 MCG tablet Commonly known as: SYNTHROID Take 1 tablet (50 mcg total) by mouth daily before breakfast.   montelukast 10 MG tablet Commonly known as: SINGULAIR Take 1 tablet (10 mg total) by mouth at bedtime.   polyvinyl alcohol 1.4 % ophthalmic solution Commonly known as: LIQUIFILM TEARS Place 1 drop into both eyes as needed for dry eyes.   predniSONE 5 MG tablet Commonly known as: DELTASONE Take 5 mg by mouth daily as needed (joint/muscle pain).   pregabalin 25 MG capsule Commonly known as: Lyrica Take 1 capsule (25 mg total) by mouth at bedtime.   PreserVision AREDS 2 Caps Take 1 capsule by mouth 2 (two) times daily.   Prolia 60 MG/ML Sosy injection Generic drug: denosumab as directed Subcutaneous        All past medical history, surgical history, allergies, family history, immunizations andmedications were updated in the EMR today and reviewed under the history and medication portions of their EMR.     ROS: Negative, with the exception of above mentioned in HPI  Objective:  BP 135/73   Pulse 61   Temp 97.9 F (36.6 C)   Wt 135 lb 12.8 oz (61.6 kg)   SpO2 98%   BMI 26.52 kg/m  Body mass index is 26.52 kg/m. Physical Exam Vitals and nursing note reviewed.  Constitutional:      General: She is not in acute distress.    Appearance: Normal appearance. She is not ill-appearing, toxic-appearing or diaphoretic.  HENT:     Head: Normocephalic and atraumatic.  Eyes:     General: No scleral icterus.       Right eye: No discharge.        Left eye: No discharge.     Extraocular Movements: Extraocular movements intact.     Conjunctiva/sclera: Conjunctivae normal.     Pupils: Pupils are equal,  round, and reactive to light.  Cardiovascular:     Rate and Rhythm: Normal rate and regular rhythm.     Heart sounds: Murmur (3/6 SM) heard.  Pulmonary:     Effort: Pulmonary effort is normal. No respiratory distress.     Breath sounds: Normal breath sounds. No wheezing, rhonchi or rales.  Musculoskeletal:     Cervical back: Neck supple.     Right lower leg: No edema.     Left lower leg: No edema.  Skin:    General: Skin is warm.  Neurological:     Mental Status: She is alert and oriented to person, place, and time. Mental status is at baseline.     Motor: No weakness.     Gait: Gait normal.  Psychiatric:        Mood and Affect: Mood normal.        Behavior: Behavior normal.        Thought Content: Thought content normal.        Judgment: Judgment normal.     No results found. No results found. No results found for this or any previous visit (from the past 24 hour(s)).   Assessment/Plan: Nyree Applegate is a 89 y.o. female present for OV for Chronic Conditions/illness Management Hypothyroidism, unspecified type Continue levothyroxine 50 mcg daily. refills will be provided in appropriate dose based on lab result today Tsh collected today - Comp Met (CMET) - TSH - Direct LDL Vit d def/osteoporosis:  prolia injections are provided by rheumatology.  Vit D, PTH and calcium levels  collected today  Rheumatoid arthritis/PMR: - Now established with rheumatology  Continue  routine follow-ups with rheumatology  Nocturnal leg cramps/RLS/PVD Stable Continue  Lyrica nightly Failed:  Mirapex. [place] controlled substance database reviewed 06/16/22  Seasonal allergies/Mild intermittent asthma, unspecified whether complicated Stable Continue  Singulair  Continue albuterol as needed  -Continue Flonase   Stenosis of carotid artery, unspecified laterality Mod-severe AS> est wi cardio with q 6 mos follow ups Vitamin D deficiency - PTH, Intact and Calcium - Vitamin D (25  hydroxy) iron deficiency anemia - CBC Encounter for long-term current use of medication - Comp Met (CMET)  Reviewed expectations re: course of current medical issues. Discussed self-management of symptoms. Outlined signs and symptoms indicating need for more acute intervention. Patient verbalized understanding and all questions were answered. Patient received an After-Visit Summary.    Orders Placed This Encounter  Procedures   CBC   Comp Met (CMET)   TSH   PTH, Intact and Calcium   Vitamin D (25 hydroxy)   Direct LDL    Meds ordered this encounter  Medications   albuterol (VENTOLIN HFA) 108 (90 Base) MCG/ACT inhaler    Sig: Inhale 2 puffs into the lungs every 6 (six) hours as needed for wheezing or shortness of breath.    Dispense:  1 each    Refill:  0    Hold until pt request   montelukast (SINGULAIR) 10 MG tablet    Sig: Take 1 tablet (10 mg total) by mouth at bedtime.    Dispense:  90 tablet    Refill:  1   pregabalin (LYRICA) 25 MG capsule    Sig: Take 1 capsule (25 mg total) by mouth at bedtime.    Dispense:  90 capsule    Refill:  1    Referral Orders  No referral(s) requested today     Note is dictated utilizing voice recognition software. Although note has been proof read prior to signing, occasional typographical errors still can be missed. If any questions arise, please do not hesitate to call for verification.   electronically signed by:  Felix Pacini, DO  [facility] - [place]

## 2022-06-16 NOTE — Telephone Encounter (Signed)
[id number] from [facility] called to report critical, high vitamin d level 117.97. Sending to both providers in PCP absence.   Please Advise.

## 2022-06-16 NOTE — Telephone Encounter (Signed)
Noted  

## 2022-06-16 NOTE — Telephone Encounter (Signed)
Noted. This is significantly elevated but does not pose any medical risk to patient in the short-term. Okay to wait for routine review of results by patient's PCP.

## 2022-06-16 NOTE — Patient Instructions (Signed)
No follow-ups on file.        Great to see you today.  I have refilled the medication(s) we provide.   If labs were collected, we will inform you of lab results once received either by echart message or telephone call.   - echart message- for normal results that have been seen by the patient already.   - telephone call: abnormal results or if patient has not viewed results in their echart.  

## 2022-06-16 NOTE — Progress Notes (Signed)
[facility] - Clinic Note  06/18/2022    CHIEF COMPLAINT Patient presents for Retina Follow Up   HISTORY OF PRESENT ILLNESS: Margaret [name] is a 89 y.o. female who presents to the clinic today for:   HPI     Retina Follow Up   Patient presents with  Wet AMD.  In right eye.  This started 4 weeks ago.  Duration of 4 weeks.  Since onset it is stable.  I, the attending physician,  performed the HPI with the patient and updated documentation appropriately.        Comments   4 week retina follow up AMD and IVV OD pt is reporting that her vision seems little better she is having some itching burning and watering she is using zaditor and it is helping       Last edited by Rennis Chris, MD on 06/18/2022  3:57 PM.      Pt has gotten new glasses, she feels like vision is the same, no new health problems   Referring physician: Felix Pacini A, DO [address] [place],  [place] [id number]  HISTORICAL INFORMATION:  Selected notes from the MEDICAL RECORD NUMBER Self referral for macular degeneration LEE: 03.21.19 (Dr. Prentice Docker in [place], [place]) [BCVA: OD: 20/40 OS: 20/40] Ocular Hx-POAG, non-exu ARMD, DES, pseudo, YAG PMH-astham, arthritis,    CURRENT MEDICATIONS: Current Outpatient Medications (Ophthalmic Drugs)  Medication Sig   cycloSPORINE (RESTASIS) 0.05 % ophthalmic emulsion 1 drop 2 (two) times daily.   polyvinyl alcohol (LIQUIFILM TEARS) 1.4 % ophthalmic solution Place 1 drop into both eyes as needed for dry eyes.   No current facility-administered medications for this visit. (Ophthalmic Drugs)   Current Outpatient Medications (Other)  Medication Sig   albuterol (VENTOLIN HFA) 108 (90 Base) MCG/ACT inhaler Inhale 2 puffs into the lungs every 6 (six) hours as needed for wheezing or shortness of breath.   CALCIUM PO Take 1 tablet by mouth 2 (two) times daily. Slow release   denosumab (PROLIA) 60 MG/ML SOSY injection as directed Subcutaneous   folic acid  (FOLVITE) 0.5 MG tablet Take 0.5 mg by mouth daily.   lactase (LACTAID) 3000 units tablet Take 2 tablets (6,000 Units total) by mouth 3 (three) times daily as needed (lactose intolerance).   levothyroxine (SYNTHROID) 50 MCG tablet Take 1 tablet (50 mcg total) by mouth daily before breakfast.   montelukast (SINGULAIR) 10 MG tablet Take 1 tablet (10 mg total) by mouth at bedtime.   Multiple Vitamins-Minerals (PRESERVISION AREDS 2) CAPS Take 1 capsule by mouth 2 (two) times daily.   predniSONE (DELTASONE) 5 MG tablet Take 5 mg by mouth daily as needed (joint/muscle pain).   pregabalin (LYRICA) 25 MG capsule Take 1 capsule (25 mg total) by mouth at bedtime.   No current facility-administered medications for this visit. (Other)   REVIEW OF SYSTEMS: ROS   Positive for: Musculoskeletal, Endocrine, Eyes Negative for: Constitutional, Gastrointestinal, Neurological, Skin, Genitourinary, HENT, Cardiovascular, Respiratory, Psychiatric, Allergic/Imm, Heme/Lymph Last edited by Etheleen Mayhew, COT on 06/18/2022  1:02 PM.        ALLERGIES Allergies  Allergen Reactions   Augmentin [Amoxicillin-Pot Clavulanate] Swelling and Rash    Swelling in face   Lactose Intolerance (Gi)    PAST MEDICAL HISTORY Past Medical History:  Diagnosis Date   Allergy    Arthritis of left knee    Asthma    Carotid artery stenosis 10/17/2012   stable report in 2018- "moderate on right, minimal  on left"   Carpal tunnel syndrome 2018   left.   Cervical radiculopathy 05/16/2015   DDD C4-C5, C5-C6, and C6-C7.  Facet arthropathy throughout cervical spine.  Normal foraminal narrowing secondary to uncovertebral  arthropathy is seen bilaterally at C3-C4 and C5/C6   Chicken pox    DDD (degenerative disc disease), lumbar 04/26/2015   Mild levoscoliosis, right anterior listhesis of L4 and L5.  DDD at every level.  Facet arthropathy at multiple levels.   Deviated septum    Glaucoma    both eyes, mild opened angle    Heart murmur    History of colon polyps    History of fall    History of frequent urinary tract infections    klebs- pansensitive. c. 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    Pseudophakia of both eyes    PVD (peripheral vascular disease) (HCC)    Rheumatic fever    89 years old    Syncope 05/17/2017   Thyroid disease    Urinary incontinence    Vertigo    had been prescribed antivert   Vitamin D deficiency    Past Surgical History:  Procedure Laterality Date   CARPAL TUNNEL RELEASE Bilateral 2019   CATARACT EXTRACTION Bilateral 1997   COLONOSCOPY  2010   NASAL SEPTUM SURGERY  2007   WRIST FRACTURE SURGERY Right 1996   FAMILY HISTORY Family History  Problem Relation Age of Onset   Arthritis Mother    COPD Mother    Prostate cancer Father    Arthritis Father    Hearing loss Father    Heart disease Father    Heart attack Father    Stroke Maternal Grandmother    Liver cancer Sister    Arthritis Brother    Prostate cancer Brother    COPD Brother    Heart disease Brother    Breast cancer Daughter    Blindness Maternal Grandfather    Stroke Maternal Grandfather    COPD Paternal Grandmother    Breast cancer Paternal Grandmother    SOCIAL HISTORY Social History   Tobacco Use   Smoking status: Former    Packs/day: .5    Types: Cigarettes    Start date: 1952    Quit date: 1965    Years since quitting: 59.3   Smokeless tobacco: Never  Vaping Use   Vaping Use: Never used  Substance Use Topics   Alcohol use: Not Currently    Alcohol/week: 5.0 standard drinks of alcohol    Types: 5 Standard drinks or equivalent per week   Drug use: Never        OPHTHALMIC EXAM:  Base Eye Exam     Visual Acuity (Snellen - Linear)       Right Left   Dist cc 20/50 -2 20/30   Dist ph cc NI NI    Correction: Glasses         Tonometry (Tonopen, 1:07 PM)       Right Left   Pressure 13 14         Pupils  Pupils Dark Light Shape React APD   Right PERRL 3 2 Round Brisk None   Left PERRL 3 2 Round Brisk None         Visual Fields       Left Right    Full Full         Extraocular Movement       Right Left    Full, Ortho Full, Ortho         Neuro/Psych     Oriented x3: Yes   Mood/Affect: Normal         Dilation     Both eyes: 2.5% Phenylephrine @ 1:07 PM           Slit Lamp and Fundus Exam     External Exam       Right Left   External Normal Normal         Slit Lamp Exam       Right Left   Lids/Lashes Dermatochalasis - upper lid, Telangiectasia Dermatochalasis - upper lid, mild Meibomian gland dysfunction   Conjunctiva/Sclera Temporal Pinguecula White and quiet   Cornea Arcus, 1+ Punctate epithelial erosions, mild tear film debris, fine endo pigment Arcus, 1-2+ inferior Punctate epithelial erosions, mild tear film debris, fine endo pigment   Anterior Chamber Deep and quiet, narrow temporal angle Deep and quiet   Iris Round and dilated Round and dilated, pigmented mass at 0200 and 0730 behind iris, anterior to IOL -- ?regression of 0200 lesion   Lens PC IOL in good position with open PC PC IOL in good position with open PC   Anterior Vitreous Vitreous syneresis Vitreous syneresis, Posterior vitreous detachment         Fundus Exam       Right Left   Disc 360 Peripapillary atrophy, Sharp rim, 2+ Pallor sharp rim, temporal PPA, mild Pallor   C/D Ratio 0.4 0.3   Macula Flat, Blunted foveal reflex, drusen, RPE mottling and clumping, +central IRF / cystic changes -- slightly improved, no heme, early RPE atrophy Flat, Blunted foveal reflex, drusen, RPE mottling and clumping, no heme or  edema   Vessels attenuated, Tortuous attenuated, Tortuous   Periphery Attached, scattered Reticular degeneration, pigmented cystoid degeneration temporally, No heme Attached, scattered Reticular degeneration, No heme           Refraction     Wearing Rx       Sphere Cylinder Axis Add   Right -0.75 +2.25 175 +2.75   Left -0.25 +1.25 165 +2.75           IMAGING AND PROCEDURES  Imaging and Procedures for @TODAY @  OCT, Retina - OU - Both Eyes       Right Eye Quality was good. Central Foveal Thickness: 286. Progression has improved. Findings include no SRF, abnormal foveal contour, retinal drusen , intraretinal hyper-reflective material, epiretinal membrane, intraretinal fluid, outer retinal atrophy (interval improvement in central cystic changes, Partial PVD).   Left Eye Quality was good. Central Foveal Thickness: 296. Progression has been stable. Findings include normal foveal contour, no IRF, no SRF, retinal drusen , outer retinal atrophy (Trace ERM, Partial PVD).   Notes *Images captured and stored on drive  Diagnosis / Impression:  OD: Exudative ARMD - interval improvement in central cystic changes, Partial PVD OS: Nonexudative ARMD OS Partial PVD OU  Clinical management:  See below  Abbreviations: NFP - Normal foveal profile. CME - cystoid macular edema. PED - pigment epithelial detachment. IRF - intraretinal fluid. SRF -  subretinal fluid. EZ - ellipsoid zone. ERM - epiretinal membrane. ORA - outer retinal atrophy. ORT - outer retinal tubulation. SRHM - subretinal hyper-reflective material       Intravitreal Injection, Pharmacologic Agent - OD - Right Eye       Time Out 06/18/2022. 1:41 PM. Confirmed correct patient, procedure, site, and patient consented.   Anesthesia Topical anesthesia was used. Anesthetic medications included Lidocaine 2%, Proparacaine 0.5%.   Procedure Preparation included 5% betadine to ocular surface, eyelid speculum. A (32g) needle  was used.   Injection: 6 mg faricimab-svoa 6 MG/0.05ML   Route: Intravitreal, Site: Right Eye   NDC: O8010301, Lot: [id number], Expiration date: 04/21/2024, Waste: 0 mL   Post-op Post injection exam found visual acuity of at least counting fingers. The patient tolerated the procedure well. There were no complications. The patient received written and verbal post procedure care education. Post injection medications were not given.               ASSESSMENT/PLAN:    ICD-10-CM   1. Exudative age-related macular degeneration of right eye with active choroidal neovascularization (HCC)  H35.3211 OCT, Retina - OU - Both Eyes    Intravitreal Injection, Pharmacologic Agent - OD - Right Eye    faricimab-svoa (VABYSMO) 6mg /0.22mL intravitreal injection    2. 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 Long-term use of Plaquenil  Z79.899     5. Pseudophakia of both eyes  Z96.1     6. Dry eyes  H04.123      1. Exudative age related macular degeneration, OD  - conversion from nonexudative ARMD noted on 9.23.22 visit  - s/p IVA OD #1 (09.23.22), #2 (10.21.22), #3 (11.18.22), #4 (12.30.22), #5 (02.13.23), #6 (03.24.23), #7 (05.08.23), #8 (06.09.23) -- IVA resistance  - s/p IVE OD #1 (07.24.23), #2 (08.25.23), #3 (09.29.23), #4 (10.27.23) -- IVE resistance  - s/p IVV OD #1 (12.01.23), #2 (01.05.34), #3 (02.02.24), #4 (03.01.24), #5 (03.29.24) - BCVA at 20/50 -- stable - OCT shows OD: interval improvement in central cystic changes, Partial PVD at 4 weeks - recommend IVV OD #6 today, 04.26.24 w/ f/u 4-5 wks - pt wishes to be treated with IVV - RBA of procedure discussed, questions answered - Eylea informed consent obtained and signed, 07.24.23 - Avastin informed consent obtained and signed, 09.23.22 - Vabysmo informed consent obtained and signed, 12.01.23 - see procedure note - f/u 4-5 weeks -- DFE/OCT,  possible injection   2. Age related macular degeneration, non-exudative OS  - intermediate stage-no change from previous visit - The incidence, anatomy, and pathology of dry AMD, risk of progression, and the AREDS and AREDS 2 study including smoking risks discussed with patient.   - recommend Amsler grid monitoring  3. Iris/ciliary body mass OS - pigmented lesion behind iris but anterior to PCIOL at 0730 and 0300 - 0730 lesion spans 0630 to 0830 behind dilated iris and larger than 0300 lesion  - patient asymptomatic - pt saw Dr. Haywood Lasso, [facility] on 11.01.22 who dx her with iris margin cyst  4. Plaquenil use for RA  - baseline testing done 05.20.20 -- started plaquenil early May 2020 f or RA - HVF 10-2 showed non-specific defects OU -- essentially normal baseline test - OCT shows no obvious plaquenil-related changes, but interval development of exudative ARMD OD as above  - currently taking 200mg  / day per Dr.  Syed's instructions --> 3.29 mg/kg body wt per day  - po prednisone, currently as needed which is about once a week - and is also now on 10mg  po MTX weekly + folic acid - the American Academy of Ophthalmology recommends dosing 5mg /kg per day to reduce risk of retinal toxicity  - recommend consideration of using alternate medication for RA especially given co-morbidity of age-related macular degeneration OU  - managed by [facility] Kathi Ludwig at [facility]  5. Pseudophakia OU - s/p CE/IOL w/ ?canaloplasty OU by Dr. Lavinia Sharps at [facility]  - doing well  - continue to monitor  6. History of Glaucoma   - not currently on gtts -- IOP 13,14 - ?underwent canaloplasty in conjunction with CEIOL at [facility] clinic   - referred to and now under the expert care of Dr. Alben Spittle -- initial Visit/consult 10.3.2019  7. Dry Eye Syndrome  - use AT's OU as needed  Ophthalmic Meds Ordered this visit:  Meds ordered this encounter  Medications   faricimab-svoa  (VABYSMO) 6mg /0.32mL intravitreal injection     This document serves as a record of services personally performed by Karie Chimera, MD, PhD. It was created on their behalf by Glee Arvin. Manson Passey, OA an ophthalmic technician. The creation of this record is the provider's dictation and/or activities during the visit.    Electronically signed by: Glee Arvin. Manson Passey, [place] 04.24.2024 1:55 AM  Karie Chimera, M.D., Ph.D. Diseases & Surgery of the Retina and Vitreous [facility]  I have reviewed the above documentation for accuracy and completeness, and I agree with the above. Karie Chimera, M.D., Ph.D. 06/19/22 1:55 AM  Abbreviations: M myopia (nearsighted); A astigmatism; H hyperopia (farsighted); P presbyopia; Mrx spectacle prescription;  CTL contact lenses; OD right eye; OS left eye; OU both eyes  XT exotropia; ET esotropia; PEK punctate epithelial keratitis; PEE punctate epithelial erosions; DES dry eye syndrome; MGD meibomian gland dysfunction; ATs artificial tears; PFAT's preservative free artificial tears; NSC nuclear sclerotic cataract; PSC posterior subcapsular cataract; ERM epi-retinal membrane; PVD posterior vitreous detachment; RD retinal detachment; DM diabetes mellitus; DR diabetic retinopathy; NPDR non-proliferative diabetic retinopathy; PDR proliferative diabetic retinopathy; CSME clinically significant macular edema; DME diabetic macular edema; dbh dot blot hemorrhages; CWS cotton wool spot; POAG primary open angle glaucoma; C/D cup-to-disc ratio; HVF humphrey visual field; GVF goldmann visual field; OCT optical coherence tomography; IOP intraocular pressure; BRVO Branch retinal vein occlusion; CRVO central retinal vein occlusion; CRAO central retinal artery occlusion; BRAO branch retinal artery occlusion; RT retinal tear; SB scleral buckle; PPV pars plana vitrectomy; VH Vitreous hemorrhage; PRP panretinal laser photocoagulation; IVK intravitreal kenalog; VMT vitreomacular  traction; MH Macular hole;  NVD neovascularization of the disc; NVE neovascularization elsewhere; AREDS age related eye disease study; ARMD age related macular degeneration; POAG primary open angle glaucoma; EBMD epithelial/anterior basement membrane dystrophy; ACIOL anterior chamber intraocular lens; IOL intraocular lens; PCIOL posterior chamber intraocular lens; Phaco/IOL phacoemulsification with intraocular lens placement; PRK photorefractive keratectomy; LASIK laser assisted in situ keratomileusis; HTN hypertension; DM diabetes mellitus; COPD chronic obstructive pulmonary disease

## 2022-06-17 ENCOUNTER — Telehealth: Payer: Self-pay | Admitting: Family Medicine

## 2022-06-17 ENCOUNTER — Other Ambulatory Visit: Payer: Self-pay | Admitting: Family Medicine

## 2022-06-17 DIAGNOSIS — E039 Hypothyroidism, unspecified: Secondary | ICD-10-CM

## 2022-06-17 LAB — PTH, INTACT AND CALCIUM
Calcium: 10.2 mg/dL (ref 8.6–10.4)
PTH: 25 pg/mL (ref 16–77)

## 2022-06-17 MED ORDER — LEVOTHYROXINE SODIUM 50 MCG PO TABS: 50.0000 ug | ORAL_TABLET | Freq: Every day | ORAL | 3 refills | Status: DC

## 2022-06-17 NOTE — Telephone Encounter (Signed)
LM for pt to return call to discuss.  

## 2022-06-17 NOTE — Telephone Encounter (Signed)
Please call patient Live, thyroid and kidney function normal  Blood cell counts and electrolytes normal Cholesterol panel is at goal   Her vitamin D levels are very high and nearing the toxic range. Please ask her how much vitamin D she has been taking, there is none listed on her medication list.  -I would encourage her to stop all vitamin D supplements for now.  She can restart vitamin D August 1, at half the dose she reports she is currently taking..       At her next normal visit in October we will recheck her vitamin D levels again to ensure they have stabilized.

## 2022-06-18 ENCOUNTER — Ambulatory Visit (INDEPENDENT_AMBULATORY_CARE_PROVIDER_SITE_OTHER): Payer: Medicare HMO | Admitting: Ophthalmology

## 2022-06-18 ENCOUNTER — Encounter (INDEPENDENT_AMBULATORY_CARE_PROVIDER_SITE_OTHER): Payer: Self-pay | Admitting: Ophthalmology

## 2022-06-18 DIAGNOSIS — H353122 Nonexudative age-related macular degeneration, left eye, intermediate dry stage: Secondary | ICD-10-CM | POA: Diagnosis not present

## 2022-06-18 DIAGNOSIS — Z79899 Other long term (current) drug therapy: Secondary | ICD-10-CM | POA: Diagnosis not present

## 2022-06-18 DIAGNOSIS — H353211 Exudative age-related macular degeneration, right eye, with active choroidal neovascularization: Secondary | ICD-10-CM | POA: Diagnosis not present

## 2022-06-18 DIAGNOSIS — H04123 Dry eye syndrome of bilateral lacrimal glands: Secondary | ICD-10-CM

## 2022-06-18 DIAGNOSIS — Z961 Presence of intraocular lens: Secondary | ICD-10-CM | POA: Diagnosis not present

## 2022-06-18 DIAGNOSIS — D4989 Neoplasm of unspecified behavior of other specified sites: Secondary | ICD-10-CM

## 2022-06-18 MED ORDER — FARICIMAB-SVOA 6 MG/0.05ML IZ SOLN
6.0000 mg | INTRAVITREAL | Status: AC | PRN
Start: 2022-06-18 — End: 2022-06-18
  Administered 2022-06-18: 6 mg via INTRAVITREAL

## 2022-06-18 NOTE — Telephone Encounter (Signed)
Pt states that she does not take vitamin d. But she is taking a multivitamin for her eyes that may have it in there.    (PRESERVISION AREDS 2) CAPS

## 2022-06-21 NOTE — Telephone Encounter (Addendum)
I would encourage her to look at the ingredients list on her eyedrop and see if it includes vitamin D. She has to be obtaining a significant dose of vitamin D somehow.  If she is not, then we need to look into other causes or refer to endocrine.

## 2022-06-21 NOTE — Telephone Encounter (Signed)
LM for pt to return call to discuss.  

## 2022-06-22 ENCOUNTER — Telehealth: Payer: Self-pay | Admitting: Family Medicine

## 2022-06-22 ENCOUNTER — Ambulatory Visit (INDEPENDENT_AMBULATORY_CARE_PROVIDER_SITE_OTHER): Payer: Medicare HMO | Admitting: Family Medicine

## 2022-06-22 ENCOUNTER — Encounter: Payer: Self-pay | Admitting: Family Medicine

## 2022-06-22 VITALS — BP 118/71 | HR 71 | Temp 97.5°F | Wt 136.0 lb

## 2022-06-22 DIAGNOSIS — R32 Unspecified urinary incontinence: Secondary | ICD-10-CM

## 2022-06-22 DIAGNOSIS — R2681 Unsteadiness on feet: Secondary | ICD-10-CM

## 2022-06-22 DIAGNOSIS — N3946 Mixed incontinence: Secondary | ICD-10-CM | POA: Diagnosis not present

## 2022-06-22 LAB — POCT URINALYSIS DIPSTICK
Bilirubin, UA: NEGATIVE
Blood, UA: NEGATIVE
Glucose, UA: NEGATIVE
Ketones, UA: NEGATIVE
Leukocytes, UA: NEGATIVE
Nitrite, UA: NEGATIVE
Protein, UA: NEGATIVE
Spec Grav, UA: 1.02 (ref 1.010–1.025)
Urobilinogen, UA: 1 E.U./dL
pH, UA: 6 (ref 5.0–8.0)

## 2022-06-22 MED ORDER — MIRABEGRON ER 25 MG PO TB24: 25.0000 mg | ORAL_TABLET | Freq: Every day | ORAL | 5 refills | Status: DC

## 2022-06-22 MED ORDER — MYRBETRIQ 25 MG PO TB24: 25.0000 mg | ORAL_TABLET | Freq: Every day | ORAL | 5 refills | Status: DC

## 2022-06-22 NOTE — Telephone Encounter (Signed)
Ms. Gravette states that she needs to talk with Dr. Claiborne Billings or the CMA about her lab results and one being higher. Please give the patient a call back.

## 2022-06-22 NOTE — Telephone Encounter (Signed)
noted 

## 2022-06-22 NOTE — Telephone Encounter (Signed)
   Pt states she takes 2 a day

## 2022-06-22 NOTE — Patient Instructions (Signed)
No follow-ups on file.        Great to see you today.  I have refilled the medication(s) we provide.   If labs were collected, we will inform you of lab results once received either by echart message or telephone call.   - echart message- for normal results that have been seen by the patient already.   - telephone call: abnormal results or if patient has not viewed results in their echart.  

## 2022-06-22 NOTE — Progress Notes (Signed)
Margaret [name] , 12/29/1929, 89 y.o., female MRN: [id number] Patient Care Team    Relationship Specialty Notifications Start End  [name], DO PCP - General Family Medicine  03/08/19   Jodelle Red, MD PCP - Cardiology Cardiology  04/30/22   Rennis Chris, MD Consulting Physician Ophthalmology  03/29/18   Grant Fontana, MD Consulting Physician Ophthalmology  03/29/18   Jacqlyn Krauss, MD Referring Physician Dermatology  08/02/19   Rossie Muskrat, MD  Rheumatology  08/02/19     Chief Complaint  Patient presents with   Urinary Incontinence    States it has been an issue since the 80s and has gradually gotten worst.      Subjective: Margaret [name] is a 89 y.o. Pt presents for an OV with complaints of urinary incontinence that has been chronic, but recently worsening.  She has had three vaginal births.  She reports urinary incontinence with coughing, sneezing, bending over/sitting up and urinary urgency/frequency.  She denies any fevers or chills.   She also mentions feeling off balance sometimes.  She states this is not all the time and is mainly when walking she will feel like she sometimes leans in a direction.  No prior history of stroke.     06/16/2022   10:22 AM 10/24/2021    9:10 AM 12/09/2020    9:53 AM 10/22/2020    1:13 PM 03/19/2020   11:32 AM  Depression screen PHQ 2/9  Decreased Interest 0 0 0 0   Down, Depressed, Hopeless 0 0 0 0 0  PHQ - 2 Score 0 0 0 0 0    Allergies  Allergen Reactions   Augmentin [Amoxicillin-Pot Clavulanate] Swelling and Rash    Swelling in face   Lactose Intolerance (Gi)    Social History   Social History Narrative   Marital status/children/pets: Widowed.  Moved to [place] from [place] area) 2019.  She shares a home with 1 of her daughters.  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   Cervical radiculopathy 05/16/2015   DDD C4-C5, C5-C6, and C6-C7.  Facet arthropathy throughout cervical spine.  Normal foraminal narrowing secondary to uncovertebral  arthropathy is seen bilaterally at C3-C4 and C5/C6   Chicken pox    DDD (degenerative disc disease), lumbar 04/26/2015   Mild levoscoliosis, right anterior listhesis of L4 and L5.  DDD at every level.  Facet arthropathy at multiple levels.   Deviated septum    Glaucoma    both eyes, mild opened angle   Heart murmur    History of colon polyps    History of fall    History of frequent urinary tract infections    klebs- pansensitive. c. 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    Pseudophakia of both eyes    PVD (peripheral vascular disease) (HCC)    Rheumatic fever    89 years old    Syncope 05/17/2017   Thyroid disease    Urinary incontinence    Vertigo    had been prescribed antivert   Vitamin D deficiency    Past Surgical History:  Procedure Laterality Date   CARPAL TUNNEL RELEASE Bilateral 2019   CATARACT EXTRACTION Bilateral 1997   COLONOSCOPY  2010   NASAL SEPTUM SURGERY  2007   WRIST FRACTURE SURGERY Right 1996   Family History  Problem Relation Age of Onset   Arthritis Mother    COPD Mother    Prostate cancer Father    Arthritis Father     Hearing loss Father    Heart disease Father    Heart attack Father    Stroke Maternal Grandmother    Liver cancer Sister    Arthritis Brother    Prostate cancer Brother    COPD Brother    Heart disease Brother    Breast cancer Daughter    Blindness Maternal Grandfather    Stroke Maternal Grandfather    COPD Paternal Grandmother    Breast cancer Paternal Grandmother    Allergies as of 06/22/2022       Reactions   Augmentin [amoxicillin-pot Clavulanate] Swelling, Rash   Swelling in face   Lactose Intolerance (gi)         Medication List        Accurate as of June 22, 2022  4:03 PM. If you have any questions, ask your nurse or doctor.          albuterol 108 (90 Base) MCG/ACT inhaler Commonly known as: VENTOLIN HFA Inhale 2 puffs into the lungs every 6 (six) hours as needed for wheezing or shortness of breath.   CALCIUM PO Take 1 tablet by mouth 2 (two) times daily. Slow release   cycloSPORINE 0.05 % ophthalmic emulsion Commonly known as: RESTASIS 1 drop 2 (two) times daily.   folic acid 0.5 MG tablet Commonly known as: FOLVITE Take 0.5 mg by mouth daily.   lactase 3000 units tablet Commonly known as: LACTAID Take 2 tablets (6,000 Units total) by mouth 3 (three) times daily as needed (lactose intolerance).   levothyroxine 50 MCG tablet Commonly known as: SYNTHROID Take 1 tablet (50 mcg total) by mouth daily before breakfast.   montelukast 10 MG tablet Commonly known as: SINGULAIR Take 1 tablet (10 mg total) by mouth at bedtime.   Myrbetriq 25 MG Tb24 tablet Generic drug: mirabegron ER Take 1 tablet (25 mg total) by mouth daily. Started by: Felix Pacini, DO   polyvinyl alcohol 1.4 % ophthalmic solution Commonly known as: LIQUIFILM TEARS Place 1 drop into both eyes as needed for dry eyes.   predniSONE 5 MG tablet Commonly known as: DELTASONE Take 5 mg by mouth daily as needed (joint/muscle pain).   pregabalin 25 MG capsule Commonly known as:  Lyrica Take 1 capsule (25 mg total) by mouth at bedtime.   PreserVision AREDS 2 Caps Take 1 capsule by mouth 2 (two) times daily.   Prolia 60 MG/ML Sosy injection Generic drug: denosumab as directed  Subcutaneous        All past medical history, surgical history, allergies, family history, immunizations andmedications were updated in the EMR today and reviewed under the history and medication portions of their EMR.     ROS Negative, with the exception of above mentioned in HPI   Objective:  BP 118/71   Pulse 71   Temp (!) 97.5 F (36.4 C)   Wt 136 lb (61.7 kg)   SpO2 96%   BMI 26.56 kg/m  Body mass index is 26.56 kg/m. Physical Exam Vitals and nursing note reviewed.  Constitutional:      General: She is not in acute distress.    Appearance: Normal appearance. She is normal weight. She is not ill-appearing or toxic-appearing.  HENT:     Head: Normocephalic and atraumatic.  Eyes:     General: No scleral icterus.       Right eye: No discharge.        Left eye: No discharge.     Extraocular Movements: Extraocular movements intact.     Conjunctiva/sclera: Conjunctivae normal.     Pupils: Pupils are equal, round, and reactive to light.  Abdominal:     General: There is no distension.     Palpations: Abdomen is soft.     Tenderness: There is no right CVA tenderness, left CVA tenderness, guarding or rebound.  Skin:    Findings: No rash.  Neurological:     Mental Status: She is alert and oriented to person, place, and time. Mental status is at baseline.     Motor: No weakness.     Coordination: Coordination normal.     Gait: Gait normal.  Psychiatric:        Mood and Affect: Mood normal.        Behavior: Behavior normal.        Thought Content: Thought content normal.        Judgment: Judgment normal.     No results found. No results found. Results for orders placed or performed in visit on 06/22/22 (from the past 24 hour(s))  POCT urinalysis dipstick      Status: None   Collection Time: 06/22/22  1:37 PM  Result Value Ref Range   Color, UA yellow    Clarity, UA clear    Glucose, UA Negative Negative   Bilirubin, UA negative    Ketones, UA negative    Spec Grav, UA 1.020 1.010 - 1.025   Blood, UA negative    pH, UA 6.0 5.0 - 8.0   Protein, UA Negative Negative   Urobilinogen, UA 1.0 0.2 or 1.0 E.U./dL   Nitrite, UA negative    Leukocytes, UA Negative Negative   Appearance clear    Odor slight     Assessment/Plan: Margaret [name] is a 89 y.o. female present for OV for   Mixed stress and urge urinary incontinence We discussed different options that are available for different types of urinary incontinence. She is agreeable to see gynecology to consider pessary if appropriate.  She would not elect to have surgery. Trial of Myrbetriq daily to help with OAB - Ambulatory referral to Gynecology - Point-of-care urinalysis is normal today  Gait instability We discussed differential diagnosis including neurological event versus MSK.  She is agreeable to physical therapy referral today.  If condition worsens I would favor MRI for further evaluation. - Ambulatory referral to Physical Therapy  Reviewed expectations re: course of current medical issues. Discussed self-management of symptoms. Outlined signs and symptoms  indicating need for more acute intervention. Patient verbalized understanding and all questions were answered. Patient received an After-Visit Summary.    Orders Placed This Encounter  Procedures   Ambulatory referral to Gynecology   Ambulatory referral to Physical Therapy   POCT urinalysis dipstick   Meds ordered this encounter  Medications   DISCONTD: mirabegron ER (MYRBETRIQ) 25 MG TB24 tablet    Sig: Take 1 tablet (25 mg total) by mouth daily.    Dispense:  30 tablet    Refill:  5   MYRBETRIQ 25 MG TB24 tablet    Sig: Take 1 tablet (25 mg total) by mouth daily.    Dispense:  30 tablet    Refill:  5    Referral Orders         Ambulatory referral to Gynecology         Ambulatory referral to Physical Therapy       Note is dictated utilizing voice recognition software. Although note has been proof read prior to signing, occasional typographical errors still can be missed. If any questions arise, please do not hesitate to call for verification.   electronically signed by:  Felix Pacini, DO  [facility] [place]

## 2022-06-23 LAB — URINALYSIS W MICROSCOPIC + REFLEX CULTURE
Bacteria, UA: NONE SEEN /HPF
Hyaline Cast: NONE SEEN /LPF
Ketones, ur: NEGATIVE
Protein, ur: NEGATIVE
RBC / HPF: NONE SEEN /HPF (ref 0–2)

## 2022-06-25 LAB — URINALYSIS W MICROSCOPIC + REFLEX CULTURE
Bilirubin Urine: NEGATIVE
Glucose, UA: NEGATIVE
Hgb urine dipstick: NEGATIVE
Nitrites, Initial: NEGATIVE
Specific Gravity, Urine: 1.009 (ref 1.001–1.035)
Squamous Epithelial / HPF: NONE SEEN /HPF (ref <=5)
WBC, UA: NONE SEEN /HPF (ref 0–5)
pH: 6.5 (ref 5.0–8.0)

## 2022-06-25 LAB — URINE CULTURE
MICRO NUMBER:: 14894682
SPECIMEN QUALITY:: ADEQUATE

## 2022-06-25 LAB — CULTURE INDICATED

## 2022-06-25 MED ORDER — NITROFURANTOIN MONOHYD MACRO 100 MG PO CAPS: 100.0000 mg | ORAL_CAPSULE | Freq: Two times a day (BID) | ORAL | 0 refills | Status: DC

## 2022-06-25 NOTE — Telephone Encounter (Signed)
Left VM for pt to return my call  

## 2022-06-25 NOTE — Telephone Encounter (Signed)
Please inform patient her urine culture is positive for an atypical bacteria of Enterococcus. Treatment is an antibiotic called Macrobid every 12 hours for 7 days.  I have called this in for her to start right away.  Maybe this will help decrease some of her urinary issues, although I do not suspect that is the cause of all of her urinary issues.

## 2022-06-25 NOTE — Telephone Encounter (Signed)
LVM for pt to return call regarding results.

## 2022-07-02 ENCOUNTER — Telehealth: Payer: Self-pay

## 2022-07-02 NOTE — Telephone Encounter (Signed)
Received PT eval request. Placed on PCP desk for signature.

## 2022-07-05 NOTE — Telephone Encounter (Signed)
Signed and returned to cma work basket 

## 2022-07-05 NOTE — Telephone Encounter (Signed)
Faxed

## 2022-07-07 NOTE — Telephone Encounter (Signed)
Patient aware PT referral faxed to [facility].  No further questions/concerns at this time.

## 2022-07-09 ENCOUNTER — Other Ambulatory Visit: Payer: Self-pay

## 2022-07-09 ENCOUNTER — Encounter: Payer: Self-pay | Admitting: Family Medicine

## 2022-07-09 ENCOUNTER — Ambulatory Visit (INDEPENDENT_AMBULATORY_CARE_PROVIDER_SITE_OTHER): Payer: Medicare HMO | Admitting: Family Medicine

## 2022-07-09 VITALS — BP 130/80 | HR 71 | Wt 133.8 lb

## 2022-07-09 DIAGNOSIS — N390 Urinary tract infection, site not specified: Secondary | ICD-10-CM

## 2022-07-09 LAB — POCT URINALYSIS DIPSTICK
Bilirubin, UA: NEGATIVE
Blood, UA: NEGATIVE
Glucose, UA: NEGATIVE
Ketones, UA: NEGATIVE
Leukocytes, UA: NEGATIVE
Nitrite, UA: NEGATIVE
Protein, UA: NEGATIVE
Spec Grav, UA: 1.02 (ref 1.010–1.025)
Urobilinogen, UA: 0.2 E.U./dL
pH, UA: 6.5 (ref 5.0–8.0)

## 2022-07-09 MED ORDER — CIPROFLOXACIN HCL 500 MG PO TABS: 500.0000 mg | ORAL_TABLET | Freq: Two times a day (BID) | ORAL | 0 refills | Status: AC

## 2022-07-09 MED ORDER — PREGABALIN 25 MG PO CAPS: 25.0000 mg | ORAL_CAPSULE | Freq: Every day | ORAL | 0 refills | Status: DC

## 2022-07-09 NOTE — Progress Notes (Signed)
OFFICE VISIT  07/09/2022  CC:  Chief Complaint  Patient presents with   Acute Visit    Possible UTI. She is out of the Pregabalin and wants to see if it can be refilled.   Patient is a 89 y.o. female who presents for urinary concerns.  HPI: She has a history of recurrent UTIs and mixed urinary incontinence. Most recent UTI was 06/22/2022, urinalysis was normal. Culture grew enterococcus hirae, sensitive to macrobid, vanco, and ampicillin. She was treated with 7d course of macrobid. She states her symptoms of voiding discomfort/burning gradually improved but did not completely resolve. Then yesterday her symptoms returned significantly again.  She feels some chills but no rigors.  No fever or nausea or flank pain.  No blood in urine. She wonders if she can have Cipro because she states historically she has responded well to this antibiotic.  Additionally, she is prescribed 25 mg of Lyrica daily for her leg pains.  She has run out of this recently and requests a short-term supply until she can get back in with her PCP, Dr. Claiborne Billings.  PMP AWARE reviewed today: most recent rx for pregabalin 25mg  caps was filled 03/29/22, # 90, rx by Dr. Claiborne Billings. No red flags.   Past Medical History:  Diagnosis Date   Allergy    Arthritis of left knee    Asthma    Carotid artery stenosis 10/17/2012   stable report in 2018- "moderate on right, minimal on left"   Carpal tunnel syndrome 2018   left.   Cervical radiculopathy 05/16/2015   DDD C4-C5, C5-C6, and C6-C7.  Facet arthropathy throughout cervical spine.  Normal foraminal narrowing secondary to uncovertebral  arthropathy is seen bilaterally at C3-C4 and C5/C6   Chicken pox    DDD (degenerative disc disease), lumbar 04/26/2015   Mild levoscoliosis, right anterior listhesis of L4 and L5.  DDD at every level.  Facet arthropathy at multiple levels.   Deviated septum    Glaucoma    both eyes, mild opened angle   Heart murmur    History of colon polyps     History of fall    History of frequent urinary tract infections    klebs- pansensitive. c. 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    Pseudophakia of both eyes    PVD (peripheral vascular disease) (HCC)    Rheumatic fever    89 years old    Syncope 05/17/2017   Thyroid disease    Urinary incontinence    Vertigo    had been prescribed antivert   Vitamin D deficiency     Past Surgical History:  Procedure Laterality Date   CARPAL TUNNEL RELEASE Bilateral 2019   CATARACT EXTRACTION Bilateral 1997   COLONOSCOPY  2010   NASAL SEPTUM SURGERY  2007   WRIST FRACTURE SURGERY Right 1996    Outpatient Medications Prior to Visit  Medication Sig Dispense Refill   albuterol (VENTOLIN HFA) 108 (90 Base) MCG/ACT inhaler Inhale 2 puffs into the lungs every 6 (six) hours as needed for wheezing or shortness of breath. 1 each 0  CALCIUM PO Take 1 tablet by mouth 2 (two) times daily. Slow release     cycloSPORINE (RESTASIS) 0.05 % ophthalmic emulsion 1 drop 2 (two) times daily.     denosumab (PROLIA) 60 MG/ML SOSY injection as directed Subcutaneous     folic acid (FOLVITE) 0.5 MG tablet Take 0.5 mg by mouth daily.     lactase (LACTAID) 3000 units tablet Take 2 tablets (6,000 Units total) by mouth 3 (three) times daily as needed (lactose intolerance). 60 tablet 0   levothyroxine (SYNTHROID) 50 MCG tablet Take 1 tablet (50 mcg total) by mouth daily before breakfast. 90 tablet 3   montelukast (SINGULAIR) 10 MG tablet Take 1 tablet (10 mg total) by mouth at bedtime. 90 tablet 1   Multiple Vitamins-Minerals (PRESERVISION AREDS 2) CAPS Take 1 capsule by mouth 2 (two) times  daily.     MYRBETRIQ 25 MG TB24 tablet Take 1 tablet (25 mg total) by mouth daily. 30 tablet 5   polyvinyl alcohol (LIQUIFILM TEARS) 1.4 % ophthalmic solution Place 1 drop into both eyes as needed for dry eyes.     pregabalin (LYRICA) 25 MG capsule Take 1 capsule (25 mg total) by mouth at bedtime. 90 capsule 1   predniSONE (DELTASONE) 5 MG tablet Take 5 mg by mouth daily as needed (joint/muscle pain). (Patient not taking: Reported on 07/09/2022)     nitrofurantoin, macrocrystal-monohydrate, (MACROBID) 100 MG capsule Take 1 capsule (100 mg total) by mouth 2 (two) times daily. 14 capsule 0   No facility-administered medications prior to visit.    Allergies  Allergen Reactions   Augmentin [Amoxicillin-Pot Clavulanate] Swelling and Rash    Swelling in face   Lactose Intolerance (Gi)     Review of Systems  As per HPI  PE:    07/09/2022    4:33 PM 07/09/2022    4:08 PM 06/22/2022    1:25 PM  Vitals with BMI  Weight  133 lbs 13 oz   Systolic 130 148 914  Diastolic 80 85 71  Pulse  71      Physical Exam  Gen: Alert, well appearing.  Patient is oriented to person, place, time, and situation. AFFECT: pleasant, lucid thought and speech. No further exam today  LABS:  Last CBC Lab Results  Component Value Date   WBC 9.9 06/16/2022   HGB 13.8 06/16/2022   HCT 40.2 06/16/2022   MCV 92.2 06/16/2022   MCH 31.3 09/05/2020   RDW 13.5 06/16/2022   PLT 234.0 06/16/2022   Last metabolic panel Lab Results  Component Value Date   GLUCOSE 94 06/16/2022   NA 141 06/16/2022   K 3.9 06/16/2022   CL 103 06/16/2022   CO2 27 06/16/2022   BUN 16 06/16/2022   CREATININE 0.77 06/16/2022   GFRNONAA >60 03/13/2020   CALCIUM 9.9 06/16/2022   CALCIUM 10.2 06/16/2022   PROT 6.3 06/16/2022   ALBUMIN 4.5 06/16/2022   BILITOT 0.9 06/16/2022   ALKPHOS 46 06/16/2022   AST 24 06/16/2022   ALT 21 06/16/2022   ANIONGAP 8 03/13/2020   IMPRESSION AND PLAN:  #1 UTI, incomplete response to  nitrofurantoin. Urinalysis is normal today. Send specimen for culture and susceptibilities. Cipro 500 twice daily x 7 days prescribed.  #2 neck leg pain.  Treated with Lyrica long-term by PCP. She has run out recently.  I went ahead and did a 30-day supply today and she is to follow-up with Dr. Claiborne Billings for any further refills.  An After Visit Summary  was printed and given to the patient.  FOLLOW UP: Return if symptoms worsen or fail to improve.  Signed:  Santiago Bumpers, MD           07/09/2022

## 2022-07-11 LAB — URINE CULTURE: SPECIMEN QUALITY:: ADEQUATE

## 2022-07-12 LAB — URINE CULTURE: MICRO NUMBER:: 14972348

## 2022-07-15 NOTE — Progress Notes (Signed)
[facility] - Clinic Note  07/16/2022    CHIEF COMPLAINT Patient presents for Retina Follow Up   HISTORY OF PRESENT ILLNESS: Margaret [name] is a 89 y.o. female who presents to the clinic today for:   HPI     Retina Follow Up   Patient presents with  Wet AMD.  In right eye.  This started 4 weeks ago.  Duration of 4 weeks.  Since onset it is stable.  I, the attending physician,  performed the HPI with the patient and updated documentation appropriately.        Comments   4 week retina follow up and IVV OD pt is reporting no vision changes noticed she denies any flashes or floaters       Last edited by Rennis Chris, MD on 07/16/2022  3:26 PM.    Pt got new glasses and feels like vision is improved   Referring physician: Felix Pacini A, DO [address] [place],  [place] [address]  HISTORICAL INFORMATION:  Selected notes from the MEDICAL RECORD NUMBER Self referral for macular degeneration LEE: 03.21.19 (Dr. Prentice Docker in [place], [place]) [BCVA: OD: 20/40 OS: 20/40] Ocular Hx-POAG, non-exu ARMD, DES, pseudo, YAG PMH-astham, arthritis,    CURRENT MEDICATIONS: Current Outpatient Medications (Ophthalmic Drugs)  Medication Sig   cycloSPORINE (RESTASIS) 0.05 % ophthalmic emulsion 1 drop 2 (two) times daily.   polyvinyl alcohol (LIQUIFILM TEARS) 1.4 % ophthalmic solution Place 1 drop into both eyes as needed for dry eyes.   No current facility-administered medications for this visit. (Ophthalmic Drugs)   Current Outpatient Medications (Other)  Medication Sig   albuterol (VENTOLIN HFA) 108 (90 Base) MCG/ACT inhaler Inhale 2 puffs into the lungs every 6 (six) hours as needed for wheezing or shortness of breath.   CALCIUM PO Take 1 tablet by mouth 2 (two) times daily. Slow release   denosumab (PROLIA) 60 MG/ML SOSY injection as directed Subcutaneous   folic acid (FOLVITE) 0.5 MG tablet Take 0.5 mg by mouth daily.   lactase (LACTAID) 3000 units tablet Take 2 tablets  (6,000 Units total) by mouth 3 (three) times daily as needed (lactose intolerance).   levothyroxine (SYNTHROID) 50 MCG tablet Take 1 tablet (50 mcg total) by mouth daily before breakfast.   montelukast (SINGULAIR) 10 MG tablet Take 1 tablet (10 mg total) by mouth at bedtime.   Multiple Vitamins-Minerals (PRESERVISION AREDS 2) CAPS Take 1 capsule by mouth 2 (two) times daily.   MYRBETRIQ 25 MG TB24 tablet Take 1 tablet (25 mg total) by mouth daily.   predniSONE (DELTASONE) 5 MG tablet Take 5 mg by mouth daily as needed (joint/muscle pain). (Patient not taking: Reported on 07/09/2022)   pregabalin (LYRICA) 25 MG capsule Take 1 capsule (25 mg total) by mouth at bedtime.   No current facility-administered medications for this visit. (Other)   REVIEW OF SYSTEMS: ROS   Positive for: Musculoskeletal, Endocrine, Eyes Negative for: Constitutional, Gastrointestinal, Neurological, Skin, Genitourinary, HENT, Cardiovascular, Respiratory, Psychiatric, Allergic/Imm, Heme/Lymph Last edited by Etheleen Mayhew, COT on 07/16/2022  1:00 PM.     ALLERGIES Allergies  Allergen Reactions   Augmentin [Amoxicillin-Pot Clavulanate] Swelling and Rash    Swelling in face   Lactose Intolerance (Gi)    PAST MEDICAL HISTORY Past Medical History:  Diagnosis Date   Allergy    Arthritis of left knee    Asthma    Carotid artery stenosis 10/17/2012   stable report in 2018- "moderate on right, minimal on left"  Carpal tunnel syndrome 2018   left.   Cervical radiculopathy 05/16/2015   DDD C4-C5, C5-C6, and C6-C7.  Facet arthropathy throughout cervical spine.  Normal foraminal narrowing secondary to uncovertebral  arthropathy is seen bilaterally at C3-C4 and C5/C6   Chicken pox    DDD (degenerative disc disease), lumbar 04/26/2015   Mild levoscoliosis, right anterior listhesis of L4 and L5.  DDD at every level.  Facet arthropathy at multiple levels.   Deviated septum    Glaucoma    both eyes, mild opened  angle   Heart murmur    History of colon polyps    History of fall    History of frequent urinary tract infections    klebs- pansensitive. c. 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    Pseudophakia of both eyes    PVD (peripheral vascular disease) (HCC)    Rheumatic fever    89 years old    Syncope 05/17/2017   Thyroid disease    Urinary incontinence    Vertigo    had been prescribed antivert   Vitamin D deficiency    Past Surgical History:  Procedure Laterality Date   CARPAL TUNNEL RELEASE Bilateral 2019   CATARACT EXTRACTION Bilateral 1997   COLONOSCOPY  2010   NASAL SEPTUM SURGERY  2007   WRIST FRACTURE SURGERY Right 1996   FAMILY HISTORY Family History  Problem Relation Age of Onset   Arthritis Mother    COPD Mother    Prostate cancer Father    Arthritis Father    Hearing loss Father    Heart disease Father    Heart attack Father    Stroke Maternal Grandmother    Liver cancer Sister    Arthritis Brother    Prostate cancer Brother    COPD Brother    Heart disease Brother    Breast cancer Daughter    Blindness Maternal Grandfather    Stroke Maternal Grandfather    COPD Paternal Grandmother    Breast cancer Paternal Grandmother    SOCIAL HISTORY Social History   Tobacco Use   Smoking status: Former    Packs/day: .5    Types: Cigarettes    Start date: 1952    Quit date: 1965    Years since quitting: 59.4   Smokeless tobacco: Never  Vaping Use   Vaping Use: Never used  Substance Use Topics   Alcohol use: Not Currently    Alcohol/week: 5.0 standard drinks of alcohol    Types: 5 Standard drinks or equivalent per week   Drug use:  Never       OPHTHALMIC EXAM:  Base Eye Exam     Visual Acuity (Snellen - Linear)       Right Left   Dist cc 20/40 20/25 -2   Dist ph cc NI NI         Tonometry (Tonopen, 1:02 PM)       Right Left   Pressure 12 15         Pupils       Pupils Dark Light Shape React APD  Right PERRL 3 2 Round Brisk None   Left PERRL 3 2 Round Brisk None         Visual Fields       Left Right    Full Full         Extraocular Movement       Right Left    Full, Ortho Full, Ortho         Neuro/Psych     Oriented x3: Yes   Mood/Affect: Normal         Dilation     Both eyes: 2.5% Phenylephrine @ 1:04 PM           Slit Lamp and Fundus Exam     External Exam       Right Left   External Normal Normal         Slit Lamp Exam       Right Left   Lids/Lashes Dermatochalasis - upper lid, Telangiectasia Dermatochalasis - upper lid, mild Meibomian gland dysfunction   Conjunctiva/Sclera Temporal Pinguecula White and quiet   Cornea Arcus, 1+ Punctate epithelial erosions, mild tear film debris, fine endo pigment Arcus, 1-2+ inferior Punctate epithelial erosions, mild tear film debris, fine endo pigment   Anterior Chamber Deep and quiet, narrow temporal angle Deep and quiet   Iris Round and dilated Round and dilated, pigmented mass at 0200 and 0730 behind iris, anterior to IOL -- ?regression of 0200 lesion   Lens PC IOL in good position with open PC PC IOL in good position with open PC   Anterior Vitreous Vitreous syneresis Vitreous syneresis, Posterior vitreous detachment         Fundus Exam       Right Left   Disc 360 Peripapillary atrophy, Sharp rim, 2+ Pallor sharp rim, temporal PPA, mild Pallor   C/D Ratio 0.4 0.3   Macula Flat, Blunted foveal reflex, drusen, RPE mottling and clumping, +central IRF / cystic changes -- slightly improved, no heme, early RPE atrophy Flat, Blunted foveal reflex, drusen, RPE mottling and clumping, no heme or edema   Vessels  attenuated, Tortuous attenuated, Tortuous   Periphery Attached, scattered Reticular degeneration, pigmented cystoid degeneration temporally, No heme Attached, scattered Reticular degeneration, No heme           Refraction     Wearing Rx       Sphere Cylinder Axis Add   Right -0.75 +2.25 175 +2.75   Left -0.25 +1.25 165 +2.75           IMAGING AND PROCEDURES  Imaging and Procedures for @TODAY @  OCT, Retina - OU - Both Eyes       Right Eye Quality was good. Central Foveal Thickness: 281. Progression has improved. Findings include no SRF, abnormal foveal contour, retinal drusen , intraretinal hyper-reflective material, epiretinal membrane, intraretinal fluid, outer retinal atrophy (Mild interval improvement in central cystic changes, Partial PVD).   Left Eye Quality was good. Central Foveal Thickness: 292. Progression has been stable. Findings include normal foveal contour, no IRF, no SRF, retinal drusen , outer retinal atrophy (Trace ERM, Partial PVD).   Notes *Images captured and stored on drive  Diagnosis / Impression:  OD: Exudative ARMD - interval improvement in central cystic changes, Partial PVD OS: Nonexudative ARMD OS Partial PVD OU  Clinical management:  See below  Abbreviations: NFP - Normal foveal profile. CME - cystoid macular edema. PED - pigment epithelial detachment. IRF - intraretinal fluid. SRF - subretinal fluid. EZ - ellipsoid zone. ERM -  epiretinal membrane. ORA - outer retinal atrophy. ORT - outer retinal tubulation. SRHM - subretinal hyper-reflective material       Intravitreal Injection, Pharmacologic Agent - OD - Right Eye       Time Out 07/16/2022. 1:50 PM. Confirmed correct patient, procedure, site, and patient consented.   Anesthesia Topical anesthesia was used. Anesthetic medications included Lidocaine 2%, Proparacaine 0.5%.   Procedure Preparation included 5% betadine to ocular surface, eyelid speculum. A (32g) needle was used.    Injection: 6 mg faricimab-svoa 6 MG/0.05ML   Route: Intravitreal, Site: Right Eye   NDC: [phone], Lot: [id number], Expiration date: 05/22/2024, Waste: 0 mL   Post-op Post injection exam found visual acuity of at least counting fingers. The patient tolerated the procedure well. There were no complications. The patient received written and verbal post procedure care education. Post injection medications were not given.            ASSESSMENT/PLAN:    ICD-10-CM   1. Exudative age-related macular degeneration of right eye with active choroidal neovascularization (HCC)  H35.3211 OCT, Retina - OU - Both Eyes    Intravitreal Injection, Pharmacologic Agent - OD - Right Eye    faricimab-svoa (VABYSMO) 6mg /0.10mL intravitreal injection    2. 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 Long-term use of Plaquenil  Z79.899     5. Pseudophakia of both eyes  Z96.1     6. Dry eyes  H04.123       1. Exudative age related macular degeneration, OD  - conversion from nonexudative ARMD noted on 9.23.22 visit  - s/p IVA OD #1 (09.23.22), #2 (10.21.22), #3 (11.18.22), #4 (12.30.22), #5 (02.13.23), #6 (03.24.23), #7 (05.08.23), #8 (06.09.23) -- IVA resistance  - s/p IVE OD #1 (07.24.23), #2 (08.25.23), #3 (09.29.23), #4 (10.27.23) -- IVE resistance  - s/p IVV OD #1 (12.01.23), #2 (01.05.34), #3 (02.02.24), #4 (03.01.24), #5 (03.29.24), #6 (04.26.24) - BCVA OD 20/40 from 20/50 -- stable - OCT shows OD: interval improvement in central cystic changes, Partial PVD at 4 weeks - recommend IVV OD #7 today, 05.24.24 w/ f/u 4 wks - pt wishes to be treated with IVV - RBA of procedure discussed, questions answered - Eylea informed consent obtained and signed, 07.24.23 - Avastin informed consent obtained and signed, 09.23.22 - Vabysmo informed consent obtained and signed, 12.01.23 - see procedure note - f/u 4 weeks -- DFE/OCT,  possible injection   2. Age related macular degeneration, non-exudative OS  - intermediate stage-no change from previous visit - The incidence, anatomy, and pathology of dry AMD, risk of progression, and the AREDS and AREDS 2 study including smoking risks discussed with patient.   - recommend Amsler grid monitoring  3. Iris/ciliary body mass OS - pigmented lesion behind iris but anterior to PCIOL at 0730 and 0300 - 0730 lesion spans 0630 to 0830 behind dilated iris and larger than 0300 lesion  - patient asymptomatic - pt saw Dr. Haywood Lasso, [facility] on 11.01.22 who dx her with iris margin cyst  4. Plaquenil use for RA  - baseline testing done 05.20.20 -- started plaquenil early May 2020 f or RA - HVF 10-2 showed non-specific defects OU -- essentially normal baseline test - OCT shows no obvious plaquenil-related changes, but interval development of exudative ARMD OD as above  - currently taking 200mg  / day per Dr. Clarise Cruz instructions --> 3.29 mg/kg body  wt per day  - po prednisone, currently as needed which is about once a week - and is also now on 10mg  po MTX weekly + folic acid - the American Academy of Ophthalmology recommends dosing 5mg /kg per day to reduce risk of retinal toxicity  - recommend consideration of using alternate medication for RA especially given co-morbidity of age-related macular degeneration OU  - [facility] Kathi [facility]  5. Pseudophakia OU - s/p CE/IOL w/ ?canaloplasty OU by Dr. Lavinia Sharps at [facility]  - doing well  - continue to monitor  6. History of Glaucoma   - not currently on gtts -- IOP 13,14 - ?underwent canaloplasty in conjunction with CEIOL at [facility] clinic   - referred to and now under the expert care of Dr. Alben Spittle -- initial Visit/consult 10.3.2019  7. Dry Eye Syndrome  - use AT's OU as needed  Ophthalmic Meds Ordered this visit:  Meds ordered this encounter  Medications   faricimab-svoa  (VABYSMO) 6mg /0.69mL intravitreal injection     This document serves as a record of services personally performed by Karie Chimera, MD, PhD. It was created on their behalf by Berlin Hun COT, an ophthalmic technician. The creation of this record is the provider's dictation and/or activities during the visit.    Electronically signed by: Berlin Hun COT 05.23.20242:46 AM  This document serves as a record of services personally performed by Karie Chimera, MD, PhD. It was created on their behalf by Glee Arvin. Manson Passey, OA an ophthalmic technician. The creation of this record is the provider's dictation and/or activities during the visit.    Electronically signed by: Glee Arvin. Manson Passey, [place] 05.24.2024 2:46 AM  Karie Chimera, M.D., Ph.D. Diseases & Surgery of the Retina and Vitreous [facility] 07/16/2022   I have reviewed the above documentation for accuracy and completeness, and I agree with the above. Karie Chimera, M.D., Ph.D. 07/19/22 2:46 AM   Abbreviations: M myopia (nearsighted); A astigmatism; H hyperopia (farsighted); P presbyopia; Mrx spectacle prescription;  CTL contact lenses; OD right eye; OS left eye; OU both eyes  XT exotropia; ET esotropia; PEK punctate epithelial keratitis; PEE punctate epithelial erosions; DES dry eye syndrome; MGD meibomian gland dysfunction; ATs artificial tears; PFAT's preservative free artificial tears; NSC nuclear sclerotic cataract; PSC posterior subcapsular cataract; ERM epi-retinal membrane; PVD posterior vitreous detachment; RD retinal detachment; DM diabetes mellitus; DR diabetic retinopathy; NPDR non-proliferative diabetic retinopathy; PDR proliferative diabetic retinopathy; CSME clinically significant macular edema; DME diabetic macular edema; dbh dot blot hemorrhages; CWS cotton wool spot; POAG primary open angle glaucoma; C/D cup-to-disc ratio; HVF humphrey visual field; GVF goldmann visual field; OCT optical  coherence tomography; IOP intraocular pressure; BRVO Branch retinal vein occlusion; CRVO central retinal vein occlusion; CRAO central retinal artery occlusion; BRAO branch retinal artery occlusion; RT retinal tear; SB scleral buckle; PPV pars plana vitrectomy; VH Vitreous hemorrhage; PRP panretinal laser photocoagulation; IVK intravitreal kenalog; VMT vitreomacular traction; MH Macular hole;  NVD neovascularization of the disc; NVE neovascularization elsewhere; AREDS age related eye disease study; ARMD age related macular degeneration; POAG primary open angle glaucoma; EBMD epithelial/anterior basement membrane dystrophy; ACIOL anterior chamber intraocular lens; IOL intraocular lens; PCIOL posterior chamber intraocular lens; Phaco/IOL phacoemulsification with intraocular lens placement; PRK photorefractive keratectomy; LASIK laser assisted in situ keratomileusis; HTN hypertension; DM diabetes mellitus; COPD chronic obstructive pulmonary disease

## 2022-07-16 ENCOUNTER — Ambulatory Visit (INDEPENDENT_AMBULATORY_CARE_PROVIDER_SITE_OTHER): Payer: Medicare HMO | Admitting: Ophthalmology

## 2022-07-16 ENCOUNTER — Encounter (INDEPENDENT_AMBULATORY_CARE_PROVIDER_SITE_OTHER): Payer: Self-pay | Admitting: Ophthalmology

## 2022-07-16 DIAGNOSIS — D4989 Neoplasm of unspecified behavior of other specified sites: Secondary | ICD-10-CM | POA: Diagnosis not present

## 2022-07-16 DIAGNOSIS — Z79899 Other long term (current) drug therapy: Secondary | ICD-10-CM | POA: Diagnosis not present

## 2022-07-16 DIAGNOSIS — H353122 Nonexudative age-related macular degeneration, left eye, intermediate dry stage: Secondary | ICD-10-CM | POA: Diagnosis not present

## 2022-07-16 DIAGNOSIS — Z961 Presence of intraocular lens: Secondary | ICD-10-CM

## 2022-07-16 DIAGNOSIS — H353211 Exudative age-related macular degeneration, right eye, with active choroidal neovascularization: Secondary | ICD-10-CM | POA: Diagnosis not present

## 2022-07-16 DIAGNOSIS — H04123 Dry eye syndrome of bilateral lacrimal glands: Secondary | ICD-10-CM

## 2022-07-16 MED ORDER — FARICIMAB-SVOA 6 MG/0.05ML IZ SOLN
6.0000 mg | INTRAVITREAL | Status: AC | PRN
Start: 2022-07-16 — End: 2022-07-16
  Administered 2022-07-16: 6 mg via INTRAVITREAL

## 2022-07-23 ENCOUNTER — Encounter: Payer: Self-pay | Admitting: Family Medicine

## 2022-07-23 ENCOUNTER — Ambulatory Visit (INDEPENDENT_AMBULATORY_CARE_PROVIDER_SITE_OTHER): Payer: Medicare HMO | Admitting: Family Medicine

## 2022-07-23 VITALS — BP 118/80 | HR 72 | Temp 98.0°F | Resp 18 | Ht 60.0 in | Wt 133.1 lb

## 2022-07-23 DIAGNOSIS — R309 Painful micturition, unspecified: Secondary | ICD-10-CM

## 2022-07-23 LAB — POCT URINALYSIS DIPSTICK
Glucose, UA: NEGATIVE
Spec Grav, UA: 1.01 (ref 1.010–1.025)
pH, UA: 7 (ref 5.0–8.0)

## 2022-07-23 MED ORDER — CIPROFLOXACIN HCL 500 MG PO TABS: 500.0000 mg | ORAL_TABLET | Freq: Two times a day (BID) | ORAL | 0 refills | Status: AC

## 2022-07-23 NOTE — Patient Instructions (Signed)
Follow up as needed or as scheduled START the Cipro 500mg  twice daily- take w/ food Drink LOTS of water Call with any questions or concerns Hang in there!!!

## 2022-07-23 NOTE — Progress Notes (Signed)
   Subjective:    Patient ID: Margaret [name], female    DOB: March 22, 1929, 89 y.o.   MRN: [id number]  HPI Pain w/ urination- pt saw Dr Milinda Cave on 5/17 for similar sxs and was tx'd w/ Cipro 500mg  BID.  Her urine culture was negative but sxs persist.  Pt reports a history of UTI's.  Pt reports sxs started ~6 weeks ago.  + suprapubic pressure, increased frequency.  Some burning w/ urination.  No fever, N/V.  No blood in urine.  This morning urine was cloudy.  Pt reports she typically takes Cipro for UTI's.   Review of Systems For ROS see HPI     Objective:   Physical Exam Vitals reviewed.  Constitutional:      General: She is not in acute distress.    Appearance: She is not ill-appearing.  HENT:     Head: Normocephalic and atraumatic.  Abdominal:     General: There is no distension.     Palpations: Abdomen is soft.     Tenderness: There is abdominal tenderness (mild suprapubic TTP). There is no right CVA tenderness, left CVA tenderness, guarding or rebound.  Skin:    General: Skin is warm and dry.  Neurological:     Mental Status: She is alert and oriented to person, place, and time.  Psychiatric:        Mood and Affect: Mood normal.        Behavior: Behavior normal.        Thought Content: Thought content normal.           Assessment & Plan:  Pain w/ urination- new to provider, recurrent issue for pt.  She just recently finished a course of Cipro.  This temporarily resolved her sxs but they quickly returned when she  completed the medication.  Last culture was negative for pathologic bacteria.  UA today showed large leuks.  Pt feels most comfortable taking Cipro as this has worked for her in the past.  Her GFR is excellent and no dose reduction required.  Will adjust meds based on cx results.  Pt expressed understanding and is in agreement w/ plan.

## 2022-07-24 LAB — UNLABELED: Test Ordered On Req: 395

## 2022-07-27 LAB — PAT ID TIQ DOC

## 2022-07-28 ENCOUNTER — Other Ambulatory Visit: Payer: Self-pay

## 2022-07-28 ENCOUNTER — Inpatient Hospital Stay (HOSPITAL_BASED_OUTPATIENT_CLINIC_OR_DEPARTMENT_OTHER)
Admission: EM | Admit: 2022-07-28 | Discharge: 2022-08-03 | DRG: 391 | Disposition: A | Discharge status: Home / Self Care | Payer: Medicare HMO | Attending: Family Medicine | Admitting: Family Medicine

## 2022-07-28 ENCOUNTER — Emergency Department (HOSPITAL_BASED_OUTPATIENT_CLINIC_OR_DEPARTMENT_OTHER): Payer: Medicare HMO | Admitting: Radiology

## 2022-07-28 ENCOUNTER — Encounter: Payer: Self-pay | Admitting: Family Medicine

## 2022-07-28 ENCOUNTER — Ambulatory Visit: Payer: Medicare HMO | Admitting: Family Medicine

## 2022-07-28 ENCOUNTER — Emergency Department (HOSPITAL_BASED_OUTPATIENT_CLINIC_OR_DEPARTMENT_OTHER): Payer: Medicare HMO

## 2022-07-28 ENCOUNTER — Encounter (HOSPITAL_BASED_OUTPATIENT_CLINIC_OR_DEPARTMENT_OTHER): Payer: Self-pay | Admitting: *Deleted

## 2022-07-28 VITALS — BP 116/68 | HR 85 | Temp 98.6°F | Ht 60.0 in | Wt 129.2 lb

## 2022-07-28 DIAGNOSIS — D72829 Elevated white blood cell count, unspecified: Secondary | ICD-10-CM

## 2022-07-28 DIAGNOSIS — K5700 Diverticulitis of small intestine with perforation and abscess without bleeding: Principal | ICD-10-CM | POA: Diagnosis present

## 2022-07-28 DIAGNOSIS — R4182 Altered mental status, unspecified: Secondary | ICD-10-CM | POA: Diagnosis not present

## 2022-07-28 DIAGNOSIS — E876 Hypokalemia: Secondary | ICD-10-CM | POA: Diagnosis present

## 2022-07-28 DIAGNOSIS — Z961 Presence of intraocular lens: Secondary | ICD-10-CM | POA: Diagnosis present

## 2022-07-28 DIAGNOSIS — K5712 Diverticulitis of small intestine without perforation or abscess without bleeding: Secondary | ICD-10-CM | POA: Diagnosis present

## 2022-07-28 DIAGNOSIS — Z87891 Personal history of nicotine dependence: Secondary | ICD-10-CM | POA: Diagnosis not present

## 2022-07-28 DIAGNOSIS — H4010X1 Unspecified open-angle glaucoma, mild stage: Secondary | ICD-10-CM | POA: Diagnosis present

## 2022-07-28 DIAGNOSIS — R030 Elevated blood-pressure reading, without diagnosis of hypertension: Secondary | ICD-10-CM | POA: Diagnosis not present

## 2022-07-28 DIAGNOSIS — I2510 Atherosclerotic heart disease of native coronary artery without angina pectoris: Secondary | ICD-10-CM | POA: Diagnosis present

## 2022-07-28 DIAGNOSIS — Z7952 Long term (current) use of systemic steroids: Secondary | ICD-10-CM | POA: Diagnosis not present

## 2022-07-28 DIAGNOSIS — R479 Unspecified speech disturbances: Secondary | ICD-10-CM

## 2022-07-28 DIAGNOSIS — D509 Iron deficiency anemia, unspecified: Secondary | ICD-10-CM | POA: Diagnosis present

## 2022-07-28 DIAGNOSIS — I739 Peripheral vascular disease, unspecified: Secondary | ICD-10-CM | POA: Diagnosis present

## 2022-07-28 DIAGNOSIS — Z87440 Personal history of urinary (tract) infections: Secondary | ICD-10-CM | POA: Diagnosis not present

## 2022-07-28 DIAGNOSIS — Z1152 Encounter for screening for COVID-19: Secondary | ICD-10-CM | POA: Diagnosis not present

## 2022-07-28 DIAGNOSIS — Z79890 Hormone replacement therapy (postmenopausal): Secondary | ICD-10-CM | POA: Diagnosis not present

## 2022-07-28 DIAGNOSIS — K651 Peritoneal abscess: Secondary | ICD-10-CM | POA: Diagnosis present

## 2022-07-28 DIAGNOSIS — R5381 Other malaise: Secondary | ICD-10-CM

## 2022-07-28 DIAGNOSIS — R011 Cardiac murmur, unspecified: Secondary | ICD-10-CM | POA: Diagnosis present

## 2022-07-28 DIAGNOSIS — H353290 Exudative age-related macular degeneration, unspecified eye, stage unspecified: Secondary | ICD-10-CM | POA: Diagnosis present

## 2022-07-28 DIAGNOSIS — Z8261 Family history of arthritis: Secondary | ICD-10-CM

## 2022-07-28 DIAGNOSIS — Z86010 Personal history of colonic polyps: Secondary | ICD-10-CM

## 2022-07-28 DIAGNOSIS — I6529 Occlusion and stenosis of unspecified carotid artery: Secondary | ICD-10-CM | POA: Diagnosis present

## 2022-07-28 DIAGNOSIS — H3530 Unspecified macular degeneration: Secondary | ICD-10-CM | POA: Diagnosis present

## 2022-07-28 DIAGNOSIS — D849 Immunodeficiency, unspecified: Secondary | ICD-10-CM | POA: Diagnosis present

## 2022-07-28 DIAGNOSIS — R4701 Aphasia: Secondary | ICD-10-CM | POA: Diagnosis present

## 2022-07-28 DIAGNOSIS — J45909 Unspecified asthma, uncomplicated: Secondary | ICD-10-CM | POA: Diagnosis present

## 2022-07-28 DIAGNOSIS — E039 Hypothyroidism, unspecified: Secondary | ICD-10-CM | POA: Diagnosis present

## 2022-07-28 DIAGNOSIS — M0600 Rheumatoid arthritis without rheumatoid factor, unspecified site: Secondary | ICD-10-CM | POA: Diagnosis present

## 2022-07-28 DIAGNOSIS — M353 Polymyalgia rheumatica: Secondary | ICD-10-CM | POA: Diagnosis present

## 2022-07-28 DIAGNOSIS — K5792 Diverticulitis of intestine, part unspecified, without perforation or abscess without bleeding: Secondary | ICD-10-CM | POA: Diagnosis not present

## 2022-07-28 DIAGNOSIS — N390 Urinary tract infection, site not specified: Secondary | ICD-10-CM

## 2022-07-28 DIAGNOSIS — R2689 Other abnormalities of gait and mobility: Secondary | ICD-10-CM

## 2022-07-28 DIAGNOSIS — M47812 Spondylosis without myelopathy or radiculopathy, cervical region: Secondary | ICD-10-CM | POA: Diagnosis present

## 2022-07-28 DIAGNOSIS — R531 Weakness: Secondary | ICD-10-CM | POA: Diagnosis not present

## 2022-07-28 DIAGNOSIS — R2681 Unsteadiness on feet: Secondary | ICD-10-CM

## 2022-07-28 DIAGNOSIS — Z79899 Other long term (current) drug therapy: Secondary | ICD-10-CM

## 2022-07-28 DIAGNOSIS — M1712 Unilateral primary osteoarthritis, left knee: Secondary | ICD-10-CM | POA: Diagnosis present

## 2022-07-28 DIAGNOSIS — Z66 Do not resuscitate: Secondary | ICD-10-CM | POA: Diagnosis present

## 2022-07-28 DIAGNOSIS — M419 Scoliosis, unspecified: Secondary | ICD-10-CM | POA: Diagnosis present

## 2022-07-28 DIAGNOSIS — I341 Nonrheumatic mitral (valve) prolapse: Secondary | ICD-10-CM | POA: Diagnosis present

## 2022-07-28 DIAGNOSIS — M810 Age-related osteoporosis without current pathological fracture: Secondary | ICD-10-CM | POA: Diagnosis present

## 2022-07-28 DIAGNOSIS — Z9841 Cataract extraction status, right eye: Secondary | ICD-10-CM

## 2022-07-28 DIAGNOSIS — M064 Inflammatory polyarthropathy: Secondary | ICD-10-CM | POA: Diagnosis present

## 2022-07-28 DIAGNOSIS — Z8701 Personal history of pneumonia (recurrent): Secondary | ICD-10-CM

## 2022-07-28 DIAGNOSIS — Z880 Allergy status to penicillin: Secondary | ICD-10-CM

## 2022-07-28 DIAGNOSIS — Z9842 Cataract extraction status, left eye: Secondary | ICD-10-CM

## 2022-07-28 DIAGNOSIS — K5730 Diverticulosis of large intestine without perforation or abscess without bleeding: Secondary | ICD-10-CM | POA: Diagnosis present

## 2022-07-28 DIAGNOSIS — E559 Vitamin D deficiency, unspecified: Secondary | ICD-10-CM | POA: Diagnosis present

## 2022-07-28 DIAGNOSIS — E739 Lactose intolerance, unspecified: Secondary | ICD-10-CM | POA: Diagnosis present

## 2022-07-28 DIAGNOSIS — Z8249 Family history of ischemic heart disease and other diseases of the circulatory system: Secondary | ICD-10-CM

## 2022-07-28 LAB — CBC
HCT: 41.6 % (ref 36.0–46.0)
Hemoglobin: 14.1 g/dL (ref 12.0–15.0)
MCH: 30.7 pg (ref 26.0–34.0)
MCHC: 33.9 g/dL (ref 30.0–36.0)
MCV: 90.6 fL (ref 80.0–100.0)
Platelets: 225 10*3/uL (ref 150–400)
RBC: 4.59 MIL/uL (ref 3.87–5.11)
RDW: 12.4 % (ref 11.5–15.5)
WBC: 18 10*3/uL — ABNORMAL HIGH (ref 4.0–10.5)
nRBC: 0 % (ref 0.0–0.2)

## 2022-07-28 LAB — POCT URINALYSIS DIPSTICK
Bilirubin, UA: POSITIVE
Blood, UA: NEGATIVE
Glucose, UA: NEGATIVE
Ketones, UA: NEGATIVE
Leukocytes, UA: NEGATIVE
Nitrite, UA: NEGATIVE
Protein, UA: POSITIVE — AB
Spec Grav, UA: 1.02 (ref 1.010–1.025)
Urobilinogen, UA: 0.2 E.U./dL
pH, UA: 6 (ref 5.0–8.0)

## 2022-07-28 LAB — BASIC METABOLIC PANEL
Anion gap: 10 (ref 5–15)
BUN: 16 mg/dL (ref 8–23)
CO2: 27 mmol/L (ref 22–32)
Calcium: 9.3 mg/dL (ref 8.9–10.3)
Chloride: 101 mmol/L (ref 98–111)
Creatinine, Ser: 0.76 mg/dL (ref 0.44–1.00)
GFR, Estimated: >60 mL/min (ref >60)
Glucose, Bld: 114 mg/dL — ABNORMAL HIGH (ref 70–99)
Potassium: 3.5 mmol/L (ref 3.5–5.1)
Sodium: 138 mmol/L (ref 135–145)

## 2022-07-28 LAB — URINALYSIS, ROUTINE W REFLEX MICROSCOPIC
Bacteria, UA: NONE SEEN
Bilirubin Urine: NEGATIVE
Glucose, UA: NEGATIVE mg/dL
Hgb urine dipstick: NEGATIVE
Leukocytes,Ua: NEGATIVE
Nitrite: NEGATIVE
Protein, ur: 30 mg/dL — AB
Specific Gravity, Urine: 1.02 (ref 1.005–1.030)
pH: 5.5 (ref 5.0–8.0)

## 2022-07-28 LAB — HEPATIC FUNCTION PANEL
ALT: 64 U/L — ABNORMAL HIGH (ref 0–44)
AST: 40 U/L (ref 15–41)
Albumin: 3.9 g/dL (ref 3.5–5.0)
Alkaline Phosphatase: 64 U/L (ref 38–126)
Bilirubin, Direct: 0.3 mg/dL — ABNORMAL HIGH (ref 0.0–0.2)
Indirect Bilirubin: 1.2 mg/dL — ABNORMAL HIGH (ref 0.3–0.9)
Total Bilirubin: 1.5 mg/dL — ABNORMAL HIGH (ref 0.3–1.2)
Total Protein: 5.9 g/dL — ABNORMAL LOW (ref 6.5–8.1)

## 2022-07-28 LAB — CK: Total CK: 60 U/L (ref 38–234)

## 2022-07-28 LAB — URINE CULTURE: SPECIMEN QUALITY:: ADEQUATE

## 2022-07-28 LAB — SARS CORONAVIRUS 2 BY RT PCR: SARS Coronavirus 2 by RT PCR: NEGATIVE

## 2022-07-28 LAB — LACTIC ACID, PLASMA: Lactic Acid, Venous: 1.1 mmol/L (ref 0.5–1.9)

## 2022-07-28 MED ORDER — ALBUTEROL SULFATE HFA 108 (90 BASE) MCG/ACT IN AERS: 2.0000 | INHALATION_SPRAY | Freq: Four times a day (QID) | RESPIRATORY_TRACT | Status: DC | PRN

## 2022-07-28 MED ORDER — HEPARIN SODIUM (PORCINE) 5000 UNIT/ML IJ SOLN
5000.0000 [IU] | Freq: Three times a day (TID) | INTRAMUSCULAR | Status: DC
  Administered 2022-07-29 – 2022-08-03 (×16): 5000 [IU] via SUBCUTANEOUS
  Filled 2022-07-28 (×16): qty 1

## 2022-07-28 MED ORDER — MONTELUKAST SODIUM 10 MG PO TABS
10.0000 mg | ORAL_TABLET | Freq: Every day | ORAL | Status: DC
  Administered 2022-07-29 – 2022-08-02 (×6): 10 mg via ORAL
  Filled 2022-07-28 (×6): qty 1

## 2022-07-28 MED ORDER — IOHEXOL 300 MG/ML  SOLN
100.0000 mL | Freq: Once | INTRAMUSCULAR | Status: AC | PRN
  Administered 2022-07-28: 75 mL via INTRAVENOUS

## 2022-07-28 MED ORDER — METRONIDAZOLE 500 MG/100ML IV SOLN
500.0000 mg | Freq: Once | INTRAVENOUS | Status: AC
  Administered 2022-07-28: 500 mg via INTRAVENOUS
  Filled 2022-07-28: qty 100

## 2022-07-28 MED ORDER — LEVOTHYROXINE SODIUM 50 MCG PO TABS
50.0000 ug | ORAL_TABLET | Freq: Every day | ORAL | Status: DC
  Administered 2022-07-29 – 2022-08-03 (×6): 50 ug via ORAL
  Filled 2022-07-28 (×6): qty 1

## 2022-07-28 MED ORDER — ONDANSETRON HCL 4 MG PO TABS: 4.0000 mg | ORAL_TABLET | Freq: Four times a day (QID) | ORAL | Status: DC | PRN

## 2022-07-28 MED ORDER — SODIUM CHLORIDE 0.9 % IV SOLN
2.0000 g | Freq: Once | INTRAVENOUS | Status: AC
  Administered 2022-07-28: 2 g via INTRAVENOUS
  Filled 2022-07-28: qty 20

## 2022-07-28 MED ORDER — SODIUM CHLORIDE 0.9 % IV SOLN
1000.0000 mL | INTRAVENOUS | Status: DC
  Administered 2022-07-28: 1000 mL via INTRAVENOUS

## 2022-07-28 MED ORDER — LACTATED RINGERS IV SOLN: INTRAVENOUS | Status: AC

## 2022-07-28 MED ORDER — ONDANSETRON HCL 4 MG/2ML IJ SOLN
4.0000 mg | Freq: Four times a day (QID) | INTRAMUSCULAR | Status: DC | PRN
  Administered 2022-08-02: 4 mg via INTRAVENOUS
  Filled 2022-07-28: qty 2

## 2022-07-28 MED ORDER — SODIUM CHLORIDE 0.9 % IV BOLUS (SEPSIS)
500.0000 mL | Freq: Once | INTRAVENOUS | Status: AC
  Administered 2022-07-28: 500 mL via INTRAVENOUS

## 2022-07-28 MED ORDER — TRAMADOL HCL 50 MG PO TABS: 50.0000 mg | ORAL_TABLET | Freq: Three times a day (TID) | ORAL | Status: DC | PRN

## 2022-07-28 NOTE — ED Notes (Signed)
Pt report was called and given to IP nurse receiving Pt care ar [facility].

## 2022-07-28 NOTE — ED Notes (Signed)
Pt walked to the bathroom but was not able to get a sample. Pt okay with [place] using an In and out cath to get a sample.

## 2022-07-28 NOTE — ED Notes (Signed)
Second blood culture was not collected due to pt refusing to be stuck again for the other culture.

## 2022-07-28 NOTE — Progress Notes (Signed)
Subjective:  Patient ID: Margaret [name], female    DOB: [birth year]  Age: 89 y.o. MRN: [id number]  CC:  Chief Complaint  Patient presents with   Urinary Tract Infection    Pt was seen last week with Dr Jaynie Crumble was given round of Cipro and daughter notes she is not herself seems to be having trouble putting her sentences together and feeling of weakness     HPI Margaret [name] presents for  Acute visit for symptoms as above.  PCP is Dr. Claiborne Billings  She was seen by my colleague May 31 for suspected urinary tract infection.  Had recently completed a course of Cipro.  Temporarily resolve symptoms but then quickly returned once completing medication.  Previous culture was negative for pathologic bacteria (May 17, and mixed genital flora) large leukocytes on urinalysis in office on the 31st.  Restarted on Cipro 500 mg twice daily for 3 days,, urine culture obtained, but not resulted.  Patient presents with daughter Margaret [name] today to provide additional history. Patient states she feels lousy - body aches, low energy. Daughter talked to her this morning on the phone at 7am.  Trouble putting thoughts into words, trouble forming speech. Similar last night, but thought had been sleepy. Sounded tired and not herself. Normal speech and interacting with dtr normally on phone 2 days ago. Aches, hard to get out of bed. Not like her - usually up at 615am.  No cough, chest pain, or headache.  Both hands feel weak.  Unsteady with walking today - shuffling.   History Patient Active Problem List   Diagnosis Date Noted   Murmur, cardiac 12/31/2021   Urinary incontinence in female 12/31/2021   Skin lesion 12/31/2021   Frequent UTI 07/16/2021   Connective tissue disease (HCC) 02/27/2020   Osteoporosis 02/27/2020   Seronegative rheumatoid arthritis (HCC) 08/02/2019   Polymyalgia rheumatica (HCC) 08/02/2019   ANA positive 05/08/2018   Vitamin D deficiency    PVD (peripheral vascular disease) (HCC)    Iron  deficiency anemia    Seasonal allergies 03/29/2018   Nocturnal leg cramps 03/29/2018   Hypothyroidism 03/29/2018   Overweight (BMI 25.0-29.9) 03/29/2018   Asthma    Cervical radiculopathy 05/16/2015   DDD (degenerative disc disease), lumbar 04/26/2015   Carotid artery stenosis 10/17/2012   Mitral valve prolapse 08/27/2011   Past Medical History:  Diagnosis Date   Allergy    Arthritis of left knee    Asthma    Carotid artery stenosis 10/17/2012   stable report in 2018- "moderate on right, minimal on left"   Carpal tunnel syndrome 2018   left.   Cervical radiculopathy 05/16/2015   DDD C4-C5, C5-C6, and C6-C7.  Facet arthropathy throughout cervical spine.  Normal foraminal narrowing secondary to uncovertebral  arthropathy is seen bilaterally at C3-C4 and C5/C6   Chicken pox    DDD (degenerative disc disease), lumbar 04/26/2015   Mild levoscoliosis, right anterior listhesis of L4 and L5.  DDD at every level.  Facet arthropathy at multiple levels.   Deviated septum    Glaucoma    both eyes, mild opened angle   Heart murmur    History of colon polyps    History of fall    History of frequent urinary tract infections    klebs- pansensitive. c. 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    Pseudophakia of both eyes    PVD (peripheral vascular disease) (HCC)    Rheumatic fever    89 years old    Syncope 05/17/2017   Thyroid disease    Urinary incontinence    Vertigo    had been prescribed antivert   Vitamin D deficiency    Past Surgical History:  Procedure Laterality Date   CARPAL TUNNEL RELEASE Bilateral 2019   CATARACT EXTRACTION Bilateral 1997    COLONOSCOPY  2010   NASAL SEPTUM SURGERY  2007   WRIST FRACTURE SURGERY Right 1996   Allergies  Allergen Reactions   Augmentin [Amoxicillin-Pot Clavulanate] Swelling and Rash    Swelling in face   Lactose Intolerance (Gi)    Prior to Admission medications   Medication Sig Start Date End Date Taking? Authorizing Provider  albuterol (VENTOLIN HFA) 108 (90 Base) MCG/ACT inhaler Inhale 2 puffs into the lungs every 6 (six) hours as needed for wheezing or shortness of breath. 06/16/22  Yes [name], [name], DO  CALCIUM PO Take 1 tablet by mouth 2 (two) times daily. Slow release   Yes [provider]  cycloSPORINE (RESTASIS) 0.05 % ophthalmic emulsion 1 drop 2 (two) times daily. 06/05/21  Yes [provider]  denosumab (PROLIA) 60 MG/ML SOSY injection as directed Subcutaneous   Yes [provider]  folic acid (FOLVITE) 0.5 MG tablet Take 0.5 mg by mouth daily.   Yes [provider]  lactase (LACTAID) 3000 units tablet Take 2 tablets (6,000 Units total) by mouth 3 (three) times daily as needed (lactose intolerance). 03/15/20  Yes Rolly Salter, MD  levothyroxine (SYNTHROID) 50 MCG tablet Take 1 tablet (50 mcg total) by mouth daily before breakfast. 06/17/22  Yes [name], [name], DO  montelukast (SINGULAIR) 10 MG tablet Take 1 tablet (10 mg total) by mouth at bedtime. 06/16/22  Yes [name], [name], DO  Multiple Vitamins-Minerals (PRESERVISION AREDS 2) CAPS Take 1 capsule by mouth 2 (two) times daily.   Yes [provider]  MYRBETRIQ 25 MG TB24 tablet Take 1 tablet (25 mg total) by mouth daily. 06/22/22  Yes [name], [name], DO  polyvinyl alcohol (LIQUIFILM TEARS) 1.4 % ophthalmic solution Place 1 drop into both eyes as needed for dry eyes.   Yes [provider]  predniSONE (DELTASONE) 5 MG tablet Take 5 mg by mouth daily as needed (joint/muscle pain). 07/10/19  Yes [provider]  pregabalin (LYRICA) 25 MG capsule Take 1 capsule (25 mg total)  by mouth at bedtime. 07/09/22  Yes McGowen, Maryjean Morn, MD   Social History   Socioeconomic History   Marital status: Widowed    Spouse name: Not on file   Number of children: 3   Years of education: Not on file   Highest education level: Not on file  Occupational History   Occupation: retired  Tobacco Use   Smoking status: Former    Packs/day: .5    Types: Cigarettes    Start date: 1952    Quit date: 1965    Years since quitting: 59.4   Smokeless tobacco: Never  Vaping Use   Vaping Use: Never used  Substance and Sexual Activity   Alcohol use: Not Currently    Alcohol/week: 5.0  standard drinks of alcohol    Types: 5 Standard drinks or equivalent per week   Drug use: Never   Sexual activity: Not Currently    Partners: Male  Other Topics Concern   Not on file  Social History Narrative   Marital status/children/pets: Widowed.  Moved to [place] from [place] area) 2019.  She shares a home with 1 of her daughters.  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, retired Engineer, agricultural:      -smoke alarm in the home:Yes     - wears seatbelt: Yes     - Feels safe in their relationships: Yes      2 caffeinated beverages a day no alcohol tobacco or drug use   Social Determinants of Health   Financial Resource Strain: Low Risk  (10/24/2021)   Overall Financial Resource Strain (CARDIA)    Difficulty of Paying Living Expenses: Not hard at all  Food Insecurity: No Food Insecurity (10/24/2021)   Hunger Vital Sign    Worried About Running Out of Food in the Last Year: Never true    Ran Out of Food in the Last Year: Never true  Transportation Needs: No Transportation Needs (10/24/2021)   PRAPARE - Administrator, Civil Service (Medical): No    Lack of Transportation (Non-Medical): No  Physical Activity: Sufficiently Active (10/24/2021)   Exercise Vital Sign    Days of Exercise per Week: 7 days    Minutes of Exercise per Session: 40  min  Stress: No Stress Concern Present (10/24/2021)   [employer] of Occupational Health - Occupational Stress Questionnaire    Feeling of Stress : Not at all  Social Connections: Socially Isolated (10/24/2021)   Social Connection and Isolation Panel [NHANES]    Frequency of Communication with Friends and Family: More than three times a week    Frequency of Social Gatherings with Friends and Family: Three times a week    Attends Religious Services: Never    Active Member of Clubs or Organizations: No    Attends Banker Meetings: Never    Marital Status: Widowed  Intimate Partner Violence: Not At Risk (10/22/2020)   Humiliation, Afraid, Rape, and Kick questionnaire    Fear of Current or Ex-Partner: No    Emotionally Abused: No    Physically Abused: No    Sexually Abused: No    Review of Systems Per HPI  Objective:   Vitals:   07/28/22 1147  BP: 116/68  Pulse: 85  Temp: 98.6 F (37 C)  TempSrc: Temporal  Weight: 129 lb 3.2 oz (58.6 kg)  Height: 5' (1.524 m)     Physical Exam Vitals reviewed.  Constitutional:      General: She is not in acute distress.    Appearance: She is well-developed. She is not ill-appearing, toxic-appearing or diaphoretic.  HENT:     Head: Normocephalic and atraumatic.  Eyes:     Conjunctiva/sclera: Conjunctivae normal.     Pupils: Pupils are equal, round, and reactive to light.  Neck:     Vascular: No carotid bruit.  Cardiovascular:     Rate and Rhythm: Normal rate and regular rhythm.     Heart sounds: Murmur heard.  Pulmonary:     Effort: Pulmonary effort is normal. No respiratory distress.     Breath sounds: Normal breath sounds. No wheezing or rhonchi.  Abdominal:     Palpations: Abdomen is soft. There is no  pulsatile mass.     Tenderness: There is no abdominal tenderness.  Musculoskeletal:     Right lower leg: No edema.     Left lower leg: No edema.  Skin:    General: Skin is warm and dry.  Neurological:      Mental Status: She is alert.     Comments: Some difficulty with initiating speech but is able to provide some history, no slur.  Equal facial movements, no droop.  No air leakage with puffing cheeks.  Equal upper extremity and lower extremity strength on testing, equal grip strength.  Possible slight pronation of right hand but subtle with pronator drift testing. Cautious gait with shuffle.  Slow.   Psychiatric:        Mood and Affect: Mood normal.        Behavior: Behavior normal.       Results for orders placed or performed in visit on 07/28/22  POCT Urinalysis Dipstick  Result Value Ref Range   Color, UA Straw    Clarity, UA clear    Glucose, UA Negative Negative   Bilirubin, UA Positive    Ketones, UA Negative    Spec Grav, UA 1.020 1.010 - 1.025   Blood, UA Negative    pH, UA 6.0 5.0 - 8.0   Protein, UA Positive (A) Negative   Urobilinogen, UA 0.2 0.2 or 1.0 E.U./dL   Nitrite, UA Negative    Leukocytes, UA Negative Negative   Appearance     Odor      Assessment & Plan:  Shykira Seats is a 89 y.o. female . Altered mental status, unspecified altered mental status type  Gait instability  Shuffling gait  Difficulty with speech  Malaise  Recurrent UTI - Plan: POCT Urinalysis Dipstick  Approximately 1 to 2 days of change in mental status, difficulty with speech, change in gait with shuffling gait, cautious gait, general malaise, and notes arthralgias without other symptoms of cough, respiratory symptoms or other acute infection symptoms.  History of recurrent UTI previously but urinalysis in office reassuring and just completed antibiotics few days ago.  Concern for underlying infection, metabolic abnormality, or neurologic insult/CVA.  Known last timing of normal status but daughter did states she was in her usual state of health 2 days ago.  Will have evaluated through ER, by private vehicle.  Plan discussed with patient and daughter, all questions answered.  No  orders of the defined types were placed in this encounter.  Patient Instructions  I am concerned about the change in mental status, weakness and change in walking as well as speech.  Please proceed to the emergency room after leaving our office for further testing and evaluation.  Urine test in office did not show sign of infection.  Hang in there.       Signed,   Meredith Staggers, MD [facility], [facility] 07/28/22 12:42 PM

## 2022-07-28 NOTE — ED Provider Notes (Signed)
  Physical Exam  BP 129/63   Pulse 72   Temp 98.2 F (36.8 C) (Oral)   Resp (!) 22   SpO2 98%   Physical Exam Vitals and nursing note reviewed.  Constitutional:      General: She is not in acute distress.    Appearance: She is well-developed.  HENT:     Head: Normocephalic and atraumatic.     Mouth/Throat:     Mouth: Mucous membranes are moist.  Eyes:     Pupils: Pupils are equal, round, and reactive to light.  Cardiovascular:     Rate and Rhythm: Normal rate and regular rhythm.     Heart sounds: No murmur heard. Pulmonary:     Effort: Pulmonary effort is normal. No respiratory distress.     Breath sounds: Normal breath sounds.  Abdominal:     General: Abdomen is flat.     Palpations: Abdomen is soft.     Tenderness: There is abdominal tenderness (generalized).  Musculoskeletal:        General: No tenderness.     Right lower leg: No edema.     Left lower leg: No edema.  Skin:    General: Skin is warm and dry.  Neurological:     General: No focal deficit present.     Mental Status: She is alert. Mental status is at baseline.  Psychiatric:        Mood and Affect: Mood normal.        Behavior: Behavior normal.     Procedures  Procedures  ED Course / MDM   Clinical Course as of 07/28/22 1854  Wed Jul 28, 2022  1449 Basic metabolic panel(!) Metabolic panel normal.  Leukocytosis noted [JK]  1542 X-ray without acute abnormality [JK]  1543 Head CT without acute abnormality [JK]  1543 COVID-negative. [JK]  1554 Received sign out from Dr. Lynelle Doctor. Patient presenting with weakness and aches. Labs with leukocytosis. No real focal symptoms but has had prior UTIs. No rashes.  [WS]  1832 Reassessed patient after no specific findings on results, patient does endorse some abdominal pain and does have some mild diffuse abdominal tenderness.  Obtain CT scan which shows small bowel diverticulitis with 2.4 cm possible abscess.  Discussed with Dr. Freida Busman of general surgery agrees with  antibiotics and they will consult.  Recommends n.p.o. as well.  Will page hospitalist for admission.  Patient report she feels okay and still does not want any pain medication. [WS]  1853 Discussed with Dr. Pola Corn who has placed admission orders.  [WS]    Clinical Course User Index [JK] Linwood Dibbles, MD [WS] Lonell Grandchild, MD   Medical Decision Making Amount and/or Complexity of Data Reviewed Labs: ordered. Decision-making details documented in ED Course. Radiology: ordered.  Risk Prescription drug management. Decision regarding hospitalization.          Lonell Grandchild, MD 07/28/22 [phone]

## 2022-07-28 NOTE — ED Notes (Signed)
Labeled urine cup given, pt notified that urine sample is needed

## 2022-07-28 NOTE — H&P (Signed)
History and Physical    Margaret [name] WUJ:[id number] DOB: 1929-03-11 DOA: 07/28/2022  PCP: [name], DO  Patient coming from:  [place]   I have personally briefly reviewed patient's old medical records in [facility]  Chief Complaint: change in ms x 2 days with confusion generalized malaise/ myalgias  HPI: Margaret [name] is a 89 y.o. female with medical history significant of inflammatory arthritis, hypothyroidism, asthma, djd cervical spine,PVD,hypothyroidism, hx of abdominal abscess 1/22 treated conservatively with antibiotics with resolution. Patient presents to ED with interim history of diagnosis of UTI by pcp on  5/17 and treated with course of cipro. Patient returned to pcp on 5/31 with persistent symptoms,she was again given course of cipro with plans to adjust antibiotics based on culture. Patient completed antibiotics and noted return ot symptoms with body aches,generalized weakness and confusion. Due to persistence of symptoms despite outpatient antibiotics patient was referred to ED by pcp.  ED Course:  Vitals:  Afeb, bp 116/68 hr 85  sat 98% on ra  IN ed on evaluation patient was found to have acute diverticulitis  with 2.4 cm abscess, on Ct scan  with elevated wbc to 18.  Patient case was discussed with  surgery Dr Freida Busman who recommend iv ctx/and flagyl Resp panel neg CTH: NAD Cxr:NAD UA: neg Further labs.  Wbc 18, plt225 , hgb 14.1 N a139, K 3.5,cr 0.76 Alkphos 64 tbil 1.5 Lactic 1.1 EKG: nsr ,pac   CTAB MPRESSION: Inflammatory stranding and fluid along the central pelvic mesentery with the adjacent small bowel loops with wall thickening and edema. There are several diverticula in this location in the changes could relate to small bowel diverticulitis. There is a 2.4 cm mottled gas collection adjacent to small bowel loop in this location which could represent a small abscess. Recommend follow-up to confirm clearance and exclude secondary pathology.    No obstruction, widespread free air.   Separate colonic diverticulosis.   Slightly heterogeneous appearance to both kidneys but no inflammatory stranding. This has have a differential including form of nephritis or vascular abnormality. Please correlate for any signs of pyelonephritis or infection. Otherwise attention on follow-up.  Tx: ns 500cc  Ctx/metronidazole  Review of Systems: As per HPI otherwise 10 point review of systems negative.   Past Medical History:  Diagnosis Date   Allergy    Arthritis of left knee    Asthma    Carotid artery stenosis 10/17/2012   stable report in 2018- "moderate on right, minimal on left"   Carpal tunnel syndrome 2018   left.   Cervical radiculopathy 05/16/2015   DDD C4-C5, C5-C6, and C6-C7.  Facet arthropathy throughout cervical spine.  Normal foraminal narrowing secondary to uncovertebral  arthropathy is seen bilaterally at C3-C4 and C5/C6   Chicken pox    DDD (degenerative disc disease), lumbar 04/26/2015   Mild levoscoliosis, right anterior listhesis of L4 and L5.  DDD at every level.  Facet arthropathy at multiple levels.   Deviated septum    Glaucoma    both eyes, mild opened angle   Heart murmur    History of colon polyps    History of fall    History of frequent urinary tract infections    klebs- pansensitive. c. 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 Her smoking use included cigarettes. She started smoking about 72 years ago. She smoked an average of .5 packs per day. She has never used smokeless tobacco. She reports that she does not currently use alcohol after a past usage of about 5.0 standard drinks of alcohol per week. She reports that she does not use drugs.  Allergies  Allergen Reactions   Augmentin [Amoxicillin-Pot Clavulanate] Swelling and Rash    Swelling in face   Lactose Intolerance (Gi)     Family History  Problem Relation Age of Onset   Arthritis Mother    COPD Mother    Prostate cancer Father    Arthritis Father    Hearing loss Father    Heart disease Father    Heart attack Father    Stroke Maternal Grandmother    Liver cancer Sister    Arthritis Brother    Prostate cancer Brother    COPD Brother    Heart disease Brother    Breast cancer Daughter    Blindness Maternal Grandfather    Stroke Maternal Grandfather    COPD Paternal Grandmother    Breast cancer Paternal Grandmother     Prior to Admission medications   Medication Sig Start Date End Date Taking? Authorizing Provider  albuterol (VENTOLIN HFA) 108 (90 Base) MCG/ACT inhaler Inhale 2 puffs into the lungs every 6 (six) hours as needed for wheezing or shortness of breath. 06/16/22   [name], [name], DO  CALCIUM PO Take 1 tablet by mouth 2 (two) times daily. Slow release    [provider]  cycloSPORINE (RESTASIS) 0.05 % ophthalmic emulsion 1 drop 2 (two) times daily. 06/05/21   [provider]  denosumab (PROLIA) 60 MG/ML SOSY injection as directed Subcutaneous    [provider]  folic acid (FOLVITE) 0.5 MG tablet Take 0.5 mg by mouth daily.    [provider]   lactase (LACTAID) 3000 units tablet Take 2 tablets (6,000 Units total) by mouth 3 (three) times daily as needed (lactose intolerance). 03/15/20   Rolly Salter, MD  levothyroxine (SYNTHROID) 50 MCG tablet Take 1 tablet (50 mcg total) by mouth daily before breakfast. 06/17/22   [name], [name], DO  montelukast (SINGULAIR) 10 MG tablet Take 1 tablet (10 mg total) by mouth at bedtime. 06/16/22   [name], [name], DO  Multiple Vitamins-Minerals (PRESERVISION AREDS 2) CAPS Take 1 capsule by mouth 2 (two) times daily.    [provider]  MYRBETRIQ 25 MG TB24 tablet Take 1 tablet (25 mg total) by mouth daily. 06/22/22   [name], [name], DO  polyvinyl alcohol (LIQUIFILM TEARS) 1.4 % ophthalmic solution Place 1 drop  into both eyes as needed for dry eyes.    [provider]  predniSONE (DELTASONE) 5 MG tablet Take 5 mg by mouth daily as needed (joint/muscle pain). 07/10/19   [provider]  pregabalin (LYRICA) 25 MG capsule Take 1 capsule (25 mg total) by mouth at bedtime. 07/09/22   Jeoffrey Massed, MD    Physical Exam: Vitals:   07/28/22 1700 07/28/22 1741 07/28/22 2124 07/28/22 2157  BP: 129/63   128/67  Pulse: 72   78  Resp: (!) 22   16  Temp:  98.2 F (36.8 C) 98.2 F (36.8 C) 98.7 F (37.1 C)  TempSrc:  Oral Oral Oral  SpO2: 98%   98%  Weight:    58.6 kg  Height:    5' (1.524 m)    Constitutional: NAD, calm, comfortable Vitals:   07/28/22 1700 07/28/22 1741 07/28/22 2124 07/28/22 2157  BP: 129/63   128/67  Pulse: 72   78  Resp: (!) 22   16  Temp:  98.2 F (36.8 C) 98.2 F (36.8 C) 98.7 F (37.1 C)  TempSrc:  Oral Oral Oral  SpO2: 98%   98%  Weight:    58.6 kg  Height:    5' (1.524 m)   Eyes: PERRL, lids and conjunctivae normal ENMT: Mucous membranes are moist. Posterior pharynx clear of any exudate or lesions.Normal dentition.  Neck: normal, supple, no masses, no thyromegaly Respiratory: clear to auscultation bilaterally, no wheezing, no crackles.  Normal respiratory effort. No accessory muscle use.  Cardiovascular: Regular rate and rhythm, no murmurs / rubs / gallops. No extremity edema. 2+ pedal pulses. No carotid bruits.  Abdomen: no tenderness, no masses palpated. No hepatosplenomegaly. Bowel sounds positive.  Musculoskeletal: no clubbing / cyanosis. No joint deformity upper and lower extremities. Good ROM, no contractures. Normal muscle tone.  Skin: no rashes, lesions, ulcers. No induration Neurologic: CN 2-12 grossly intact. Sensation intact, DTR normal. Strength 5/5 in all 4.  Psychiatric: Normal judgment and insight. Alert and oriented x 3. Normal mood.    Labs on Admission: I have personally reviewed following labs and imaging studies  CBC: Recent Labs  Lab 07/28/22 1319  WBC 18.0*  HGB 14.1  HCT 41.6  MCV 90.6  PLT 225   Basic Metabolic Panel: Recent Labs  Lab 07/28/22 1319  NA 138  K 3.5  CL 101  CO2 27  GLUCOSE 114*  BUN 16  CREATININE 0.76  CALCIUM 9.3   GFR: Estimated Creatinine Clearance: 37.4 mL/min (by C-G formula based on SCr of 0.76 mg/dL). Liver Function Tests: Recent Labs  Lab 07/28/22 1528  AST 40  ALT 64*  ALKPHOS 64  BILITOT 1.5*  PROT 5.9*  ALBUMIN 3.9   No results for input(s): "LIPASE", "AMYLASE" in the last 168 hours. No results for input(s): "AMMONIA" in the last 168 hours. Coagulation Profile: No results for input(s): "INR", "PROTIME" in the last 168 hours. Cardiac Enzymes: Recent Labs  Lab 07/28/22 1528  CKTOTAL 60   BNP (last 3 results) No results for input(s): "PROBNP" in the last 8760 hours. HbA1C: No results for input(s): "HGBA1C" in the last 72 hours. CBG: No results for input(s): "GLUCAP" in the last 168 hours. Lipid Profile: No results for input(s): "CHOL", "HDL", "LDLCALC", "TRIG", "CHOLHDL", "LDLDIRECT" in the last 72 hours. Thyroid Function Tests: No results for input(s): "TSH", "T4TOTAL", "FREET4", "T3FREE", "THYROIDAB" in the last 72 hours. Anemia  Panel: No results for input(s): "VITAMINB12", "FOLATE", "FERRITIN", "TIBC", "IRON", "RETICCTPCT" in  the last 72 hours. Urine analysis:    Component Value Date/Time   COLORURINE YELLOW 07/28/2022 1547   APPEARANCEUR CLEAR 07/28/2022 1547   LABSPEC 1.020 07/28/2022 1547   PHURINE 5.5 07/28/2022 1547   GLUCOSEU NEGATIVE 07/28/2022 1547   HGBUR NEGATIVE 07/28/2022 1547   BILIRUBINUR NEGATIVE 07/28/2022 1547   BILIRUBINUR Positive 07/28/2022 1201   KETONESUR TRACE (A) 07/28/2022 1547   PROTEINUR 30 (A) 07/28/2022 1547   PROTEINUR Positive (A) 07/28/2022 1201   UROBILINOGEN 0.2 07/28/2022 1201   NITRITE NEGATIVE 07/28/2022 1547   NITRITE Negative 07/28/2022 1201   LEUKOCYTESUR NEGATIVE 07/28/2022 1547   LEUKOCYTESUR Negative 07/28/2022 1201    Radiological Exams on Admission: CT ABDOMEN PELVIS W CONTRAST  Result Date: 07/28/2022 CLINICAL DATA:  Nonlocalized acute abdominal pain EXAM: CT ABDOMEN AND PELVIS WITH CONTRAST TECHNIQUE: Multidetector CT imaging of the abdomen and pelvis was performed using the standard protocol following bolus administration of intravenous contrast. RADIATION DOSE REDUCTION: This exam was performed according to the departmental dose-optimization program which includes automated exposure control, adjustment of the mA and/or kV according to patient size and/or use of iterative reconstruction technique. CONTRAST:  75mL OMNIPAQUE IOHEXOL 300 MG/ML  SOLN COMPARISON:  CT 06/12/2020 FINDINGS: Lower chest: There is some linear opacity lung bases likely scar or atelectasis. Slight pleural thickening at the left lung base. Calcifications seen along the mitral valve annulus and along the area of the aortic valve. Hepatobiliary: No focal liver abnormality is seen. No gallstones, gallbladder wall thickening, or biliary dilatation. Patent portal vein Pancreas: Unremarkable. No pancreatic ductal dilatation or surrounding inflammatory changes. Spleen: Normal in size without focal  abnormality. Adrenals/Urinary Tract: Stable slight thickening of the left adrenal gland. Right adrenal gland is stable. No collecting system dilatation of either kidney. On portal venous phase there are some subtle patchy areas of incomplete enhancement along each kidney which are less apparent on the delayed dataset. No adjacent inflammatory stranding at this time. Preserved contours of the urinary bladder which is underdistended. Stomach/Bowel: No oral contrast. Large bowel has left-sided colonic diverticulosis. Scattered colonic stool diffusely. Stomach is nondilated. There is extensive edema in the central pelvic mesentery. The small bowel diffusely is nondilated but there is fold thickening along loops of small bowel in the central mesentery near the inflammatory stranding and fluid. There is a collection of mottled gas and debris in this location in the central mesentery on image 50 measuring 2.5 x 2.2 cm. Based on appearance these changes could relate to small bowel diverticulitis. A small abscess is not excluded. No associated obstruction or widespread free air. Vascular/Lymphatic: Aortic atherosclerosis. No enlarged abdominal or pelvic lymph nodes. Reproductive: Uterus is present. Prominent periuterine vessels, left-greater-than-right. Other: No free intra-abdominal air. Musculoskeletal: Scattered degenerative changes of the spine and pelvis. Curvature of the lumbar spine. Mild listhesis. Posterior osteophytes and disc bulging identified at multiple levels, greatest at T12-L1 with central canal stenosis. IMPRESSION: Inflammatory stranding and fluid along the central pelvic mesentery with the adjacent small bowel loops with wall thickening and edema. There are several diverticula in this location in the changes could relate to small bowel diverticulitis. There is a 2.4 cm mottled gas collection adjacent to small bowel loop in this location which could represent a small abscess. Recommend follow-up to confirm  clearance and exclude secondary pathology. No obstruction, widespread free air. Separate colonic diverticulosis. Slightly heterogeneous appearance to both kidneys but no inflammatory stranding. This has have a differential including form of nephritis or vascular abnormality. Please correlate  for any signs of pyelonephritis or infection. Otherwise attention on follow-up. Electronically Signed   By: Karen Kays M.D.   On: 07/28/2022 18:08   DG Chest 2 View  Result Date: 07/28/2022 CLINICAL DATA:  weakness EXAM: CHEST - 2 VIEW COMPARISON:  CXR 02/27/20 FINDINGS: No pleural effusion. No pneumothorax. Normal cardiac and mediastinal contours. No focal airspace opacity. No radiographically apparent displaced rib fractures. Visualized upper abdomen is unremarkable. Vertebral body heights are maintained. IMPRESSION: No focal airspace opacity. Electronically Signed   By: Lorenza Cambridge M.D.   On: 07/28/2022 15:18   CT Head Wo Contrast  Result Date: 07/28/2022 CLINICAL DATA:  Altered mental status, weakness, change in walking, change in speech. EXAM: CT HEAD WITHOUT CONTRAST TECHNIQUE: Contiguous axial images were obtained from the base of the skull through the vertex without intravenous contrast. RADIATION DOSE REDUCTION: This exam was performed according to the departmental dose-optimization program which includes automated exposure control, adjustment of the mA and/or kV according to patient size and/or use of iterative reconstruction technique. COMPARISON:  None Available. FINDINGS: Brain: There is no acute intracranial hemorrhage, extra-axial fluid collection, or acute infarct. There is background parenchymal volume loss with prominence of the ventricular system and extra-axial CSF spaces, within expected limits for age. There is patchy and confluent hypodensity in the supratentorial white matter likely reflecting sequela of underlying chronic small-vessel ischemic change. There is no mass lesion. There is no mass  effect or midline shift. The pituitary and suprasellar region are normal. Vascular: There is calcification of the bilateral carotid siphons. Skull: Choose Sinuses/Orbits: The imaged paranasal sinuses are clear. Bilateral lens implants are in place. The globes and orbits are otherwise unremarkable. Other: None. IMPRESSION: No acute intracranial pathology. Electronically Signed   By: Lesia Hausen M.D.   On: 07/28/2022 15:14    EKG: Independently reviewed. See above  Assessment/Plan  Acute diverticulitis with associated diverticular abscess -admit to med tele  -continue on ivfs  -continue on ctx/metronidazole  -clear liquids as tolerated  -f/u with surgery rec dr Jean Rosenthal   Inflammatory arthritis -no active flare  -hold any immunosuppressive medications   Hypothyroidism -resume synthroid   Asthma, -no acute exacerbation  -prn nebs    Djd cervical spine -supportive care   PVD -no active issues   DVT prophylaxis: heparin Code Status:  Family Communication:  Disposition Plan: patient  expected to be admitted greater than 2 midnights  Consults called: Surgery Dr Jean Rosenthal Admission status: med tele  Lurline Del MD [facility]   If 7PM-7AM, please contact night-coverage [email or web address] Password [facility]  07/28/2022, 10:26 PM

## 2022-07-28 NOTE — Patient Instructions (Signed)
I am concerned about the change in mental status, weakness and change in walking as well as speech.  Please proceed to the emergency room after leaving our office for further testing and evaluation.  Urine test in office did not show sign of infection.  Hang in there.

## 2022-07-28 NOTE — ED Provider Notes (Signed)
[place] EMERGENCY DEPARTMENT AT [facility] Provider Note   CSN: [id number] Arrival date & time: 07/28/22  1259     History  Chief Complaint  Patient presents with   Weakness    Margaret [name] is a 89 y.o. female.   Weakness  Has history of seasonal allergies vitamin D deficiency cervical radiculopathy, rheumatoid arthritis, polymyalgia rheumatica, frequent UTIs  Pt had trouble getting out of bed this morning. She was feeling weak.  Pt was feeling achy all over.    No cough.  No vomiting or diarrhea.  No trouble urinating.  NO sore throat.  No rashes.  No ill contacts.  Family states she has been unsteady on her feet today. Pt had been treated for a uti recently.  Not currently on abx.   Home Medications Prior to Admission medications   Medication Sig Start Date End Date Taking? Authorizing Provider  albuterol (VENTOLIN HFA) 108 (90 Base) MCG/ACT inhaler Inhale 2 puffs into the lungs every 6 (six) hours as needed for wheezing or shortness of breath. 06/16/22   [name], [name], DO  CALCIUM PO Take 1 tablet by mouth 2 (two) times daily. Slow release    [provider]  cycloSPORINE (RESTASIS) 0.05 % ophthalmic emulsion 1 drop 2 (two) times daily. 06/05/21   [provider]  denosumab (PROLIA) 60 MG/ML SOSY injection as directed Subcutaneous    [provider]  folic acid (FOLVITE) 0.5 MG tablet Take 0.5 mg by mouth daily.    [provider]  lactase (LACTAID) 3000 units tablet Take 2 tablets (6,000 Units total) by mouth 3 (three) times daily as needed (lactose intolerance). 03/15/20   Rolly Salter, MD  levothyroxine (SYNTHROID) 50 MCG tablet Take 1 tablet (50 mcg total) by mouth daily before breakfast. 06/17/22   [name], [name], DO  montelukast (SINGULAIR) 10 MG tablet Take 1 tablet (10 mg total) by mouth at bedtime. 06/16/22   [name], [name], DO  Multiple Vitamins-Minerals (PRESERVISION AREDS 2) CAPS Take 1 capsule by mouth 2 (two) times  daily.    [provider]  MYRBETRIQ 25 MG TB24 tablet Take 1 tablet (25 mg total) by mouth daily. 06/22/22   [name], [name], DO  polyvinyl alcohol (LIQUIFILM TEARS) 1.4 % ophthalmic solution Place 1 drop into both eyes as needed for dry eyes.    [provider]  predniSONE (DELTASONE) 5 MG tablet Take 5 mg by mouth daily as needed (joint/muscle pain). 07/10/19   [provider]  pregabalin (LYRICA) 25 MG capsule Take 1 capsule (25 mg total) by mouth at bedtime. 07/09/22   McGowen, Maryjean Morn, MD      Allergies    Augmentin [amoxicillin-pot clavulanate] and Lactose intolerance (gi)    Review of Systems   Review of Systems  Neurological:  Positive for weakness.    Physical Exam Updated Vital Signs BP 121/61 (BP Location: Left Arm)   Pulse 79   Temp (!) 97.5 F (36.4 C)   Resp 18   SpO2 98%  Physical Exam Vitals and nursing note reviewed.  Constitutional:      Appearance: She is well-developed. She is not diaphoretic.  HENT:     Head: Normocephalic and atraumatic.     Right Ear: External ear normal.     Left Ear: External ear normal.  Eyes:     General: No scleral icterus.       Right eye: No discharge.        Left  eye: No discharge.     Conjunctiva/sclera: Conjunctivae normal.  Neck:     Trachea: No tracheal deviation.  Cardiovascular:     Rate and Rhythm: Normal rate and regular rhythm.  Pulmonary:     Effort: Pulmonary effort is normal. No respiratory distress.     Breath sounds: Normal breath sounds. No stridor. No wheezing or rales.  Abdominal:     General: Bowel sounds are normal. There is no distension.     Palpations: Abdomen is soft.     Tenderness: There is no abdominal tenderness. There is no guarding or rebound.  Musculoskeletal:        General: No tenderness or deformity.     Cervical back: Neck supple.     Right lower leg: No edema.     Left lower leg: No edema.  Skin:    General: Skin is warm and dry.     Findings: No rash.   Neurological:     General: No focal deficit present.     Mental Status: She is alert.     Cranial Nerves: No cranial nerve deficit, dysarthria or facial asymmetry.     Sensory: No sensory deficit.     Motor: No abnormal muscle tone or seizure activity.     Coordination: Coordination normal.  Psychiatric:        Mood and Affect: Mood normal.     ED Results / Procedures / Treatments   Labs (all labs ordered are listed, but only abnormal results are displayed) Labs Reviewed  BASIC METABOLIC PANEL - Abnormal; Notable for the following components:      Result Value   Glucose, Bld 114 (*)    All other components within normal limits  CBC - Abnormal; Notable for the following components:   WBC 18.0 (*)    All other components within normal limits  SARS CORONAVIRUS 2 BY RT PCR  CULTURE, BLOOD (ROUTINE X 2)  CULTURE, BLOOD (ROUTINE X 2)  URINALYSIS, ROUTINE W REFLEX MICROSCOPIC  HEPATIC FUNCTION PANEL  LACTIC ACID, PLASMA  LACTIC ACID, PLASMA    EKG EKG Interpretation  Date/Time:  Wednesday July 28 2022 13:35:56 EDT Ventricular Rate:  81 PR Interval:  185 QRS Duration: 103 QT Interval:  405 QTC Calculation: 471 R Axis:   40 Text Interpretation: Sinus rhythm Atrial premature complex Biatrial enlargement Confirmed by Linwood Dibbles [id number]) on 07/28/2022 1:48:31 PM  Radiology DG Chest 2 View  Result Date: 07/28/2022 CLINICAL DATA:  weakness EXAM: CHEST - 2 VIEW COMPARISON:  CXR 02/27/20 FINDINGS: No pleural effusion. No pneumothorax. Normal cardiac and mediastinal contours. No focal airspace opacity. No radiographically apparent displaced rib fractures. Visualized upper abdomen is unremarkable. Vertebral body heights are maintained. IMPRESSION: No focal airspace opacity. Electronically Signed   By: Lorenza Cambridge M.D.   On: 07/28/2022 15:18   CT Head Wo Contrast  Result Date: 07/28/2022 CLINICAL DATA:  Altered mental status, weakness, change in walking, change in speech. EXAM: CT HEAD  WITHOUT CONTRAST TECHNIQUE: Contiguous axial images were obtained from the base of the skull through the vertex without intravenous contrast. RADIATION DOSE REDUCTION: This exam was performed according to the departmental dose-optimization program which includes automated exposure control, adjustment of the mA and/or kV according to patient size and/or use of iterative reconstruction technique. COMPARISON:  None Available. FINDINGS: Brain: There is no acute intracranial hemorrhage, extra-axial fluid collection, or acute infarct. There is background parenchymal volume loss with prominence of the ventricular system and extra-axial CSF spaces, within  expected limits for age. There is patchy and confluent hypodensity in the supratentorial white matter likely reflecting sequela of underlying chronic small-vessel ischemic change. There is no mass lesion. There is no mass effect or midline shift. The pituitary and suprasellar region are normal. Vascular: There is calcification of the bilateral carotid siphons. Skull: Choose Sinuses/Orbits: The imaged paranasal sinuses are clear. Bilateral lens implants are in place. The globes and orbits are otherwise unremarkable. Other: None. IMPRESSION: No acute intracranial pathology. Electronically Signed   By: Lesia Hausen M.D.   On: 07/28/2022 15:14    Procedures Procedures    Medications Ordered in ED Medications  sodium chloride 0.9 % bolus 500 mL (has no administration in time range)    Followed by  0.9 %  sodium chloride infusion (has no administration in time range)    ED Course/ Medical Decision Making/ A&P Clinical Course as of 07/28/22 1551  Wed Jul 28, 2022  1449 Basic metabolic panel(!) Metabolic panel normal.  Leukocytosis noted [JK]  1542 X-ray without acute abnormality [JK]  1543 Head CT without acute abnormality [JK]  1543 COVID-negative. [JK]    Clinical Course User Index [JK] Linwood Dibbles, MD                             Medical Decision  Making Diff. diagnosis includes but not limited to anemia, dehydration, acute infection  Problems Addressed: Leukocytosis, unspecified type: acute illness or injury that poses a threat to life or bodily functions  Amount and/or Complexity of Data Reviewed Labs: ordered. Decision-making details documented in ED Course. Radiology: ordered and independent interpretation performed.  Risk Prescription drug management.   Patient presented to the ED for evaluation of myalgias, weakness.  Weakness seems to be more generalized.  No focal deficits to suggest stroke.  Patient noted to have leukocytosis.  Labs without signs of severe dehydration.  COVID is negative.  Chest x-ray without pneumonia and CT scan does not show any acute abnormalities.  Lactic acid level urinalysis still pending.  Care turned over to Dr Suezanne Jacquet at shift change.        Final Clinical Impression(s) / ED Diagnoses Final diagnoses:  Leukocytosis, unspecified type    Rx / DC Orders ED Discharge Orders     None         Linwood Dibbles, MD 07/28/22 [phone]

## 2022-07-28 NOTE — ED Triage Notes (Signed)
Pt was sent here from her PCP and was sent here with d/c stating "I am concenred about the change in mental status, weakness and change in walking as well as speech".  Pt lives with her daughter and these changes have been going on for 2 days.  Pt is alert and oriented and ambulatory to triage.  Daughter describes that she is slower and has no energy.

## 2022-07-28 NOTE — Progress Notes (Signed)
Patient is transferred from [facility] to the unit at 2200. Alert and oriented x 4. No pain complained at this time. Paged MD admission on call at 2200 and waiting for new orders.

## 2022-07-28 NOTE — Progress Notes (Signed)
Call received from ED at [facility] 89 year old female recently treated by PCP for suspected UTI with ciprofloxacin.  After completion of antibiotics, started having abdominal pain, weakness, altered mental status  Follow-up with PCP today 6/5 sent to ED for worsening symptoms Afebrile, hemodynamically stable Not septic WBC count elevated to 18 CT abdomen pelvis showed acute diverticulitis with a possible 2.4 cm abscess EDP discussed with general surgery Dr. Freida Busman Recommended IV Rocephin, IV Flagyl Hospital service consulted for inpatient admission Accepted to MedSurg bed

## 2022-07-29 ENCOUNTER — Encounter (HOSPITAL_COMMUNITY): Payer: Self-pay

## 2022-07-29 DIAGNOSIS — Z66 Do not resuscitate: Secondary | ICD-10-CM | POA: Diagnosis present

## 2022-07-29 DIAGNOSIS — K5792 Diverticulitis of intestine, part unspecified, without perforation or abscess without bleeding: Secondary | ICD-10-CM | POA: Diagnosis not present

## 2022-07-29 LAB — CBC
HCT: 35.6 % — ABNORMAL LOW (ref 36.0–46.0)
Hemoglobin: 11.9 g/dL — ABNORMAL LOW (ref 12.0–15.0)
MCH: 31.2 pg (ref 26.0–34.0)
MCHC: 33.4 g/dL (ref 30.0–36.0)
MCV: 93.2 fL (ref 80.0–100.0)
Platelets: 176 10*3/uL (ref 150–400)
RBC: 3.82 MIL/uL — ABNORMAL LOW (ref 3.87–5.11)
RDW: 12.2 % (ref 11.5–15.5)
WBC: 12.7 10*3/uL — ABNORMAL HIGH (ref 4.0–10.5)
nRBC: 0 % (ref 0.0–0.2)

## 2022-07-29 LAB — COMPREHENSIVE METABOLIC PANEL
ALT: 55 U/L — ABNORMAL HIGH (ref 0–44)
AST: 35 U/L (ref 15–41)
Albumin: 3.2 g/dL — ABNORMAL LOW (ref 3.5–5.0)
Alkaline Phosphatase: 78 U/L (ref 38–126)
Anion gap: 10 (ref 5–15)
BUN: 12 mg/dL (ref 8–23)
CO2: 20 mmol/L — ABNORMAL LOW (ref 22–32)
Calcium: 7.5 mg/dL — ABNORMAL LOW (ref 8.9–10.3)
Chloride: 105 mmol/L (ref 98–111)
Creatinine, Ser: 0.76 mg/dL (ref 0.44–1.00)
GFR, Estimated: >60 mL/min (ref >60)
Glucose, Bld: 95 mg/dL (ref 70–99)
Potassium: 3 mmol/L — ABNORMAL LOW (ref 3.5–5.1)
Sodium: 135 mmol/L (ref 135–145)
Total Bilirubin: 1.2 mg/dL (ref 0.3–1.2)
Total Protein: 5.7 g/dL — ABNORMAL LOW (ref 6.5–8.1)

## 2022-07-29 LAB — GLUCOSE, CAPILLARY: Glucose-Capillary: 75 mg/dL (ref 70–99)

## 2022-07-29 LAB — CULTURE, BLOOD (ROUTINE X 2)

## 2022-07-29 MED ORDER — POLYVINYL ALCOHOL 1.4 % OP SOLN: 1.0000 [drp] | OPHTHALMIC | Status: DC | PRN

## 2022-07-29 MED ORDER — PREGABALIN 25 MG PO CAPS
25.0000 mg | ORAL_CAPSULE | Freq: Every day | ORAL | Status: DC
  Administered 2022-07-29 – 2022-08-02 (×5): 25 mg via ORAL
  Filled 2022-07-29 (×5): qty 1

## 2022-07-29 MED ORDER — CYCLOSPORINE 0.05 % OP EMUL
1.0000 [drp] | Freq: Two times a day (BID) | OPHTHALMIC | Status: DC
  Administered 2022-07-29 – 2022-08-03 (×10): 1 [drp] via OPHTHALMIC
  Filled 2022-07-29 (×10): qty 30

## 2022-07-29 MED ORDER — PROSIGHT PO TABS
1.0000 | ORAL_TABLET | Freq: Two times a day (BID) | ORAL | Status: DC
  Administered 2022-07-29 – 2022-08-03 (×10): 1 via ORAL
  Filled 2022-07-29 (×10): qty 1

## 2022-07-29 MED ORDER — POTASSIUM CHLORIDE 10 MEQ/100ML IV SOLN
10.0000 meq | INTRAVENOUS | Status: AC
  Administered 2022-07-29 (×6): 10 meq via INTRAVENOUS
  Filled 2022-07-29 (×6): qty 100

## 2022-07-29 MED ORDER — FOLIC ACID 1 MG PO TABS
0.5000 mg | ORAL_TABLET | Freq: Every day | ORAL | Status: DC
  Administered 2022-07-29 – 2022-08-03 (×6): 0.5 mg via ORAL
  Filled 2022-07-29 (×6): qty 1

## 2022-07-29 MED ORDER — METRONIDAZOLE 500 MG/100ML IV SOLN
500.0000 mg | Freq: Two times a day (BID) | INTRAVENOUS | Status: DC
  Administered 2022-07-29 – 2022-08-03 (×11): 500 mg via INTRAVENOUS
  Filled 2022-07-29 (×11): qty 100

## 2022-07-29 MED ORDER — SODIUM CHLORIDE 0.9 % IV SOLN
2.0000 g | INTRAVENOUS | Status: DC
  Administered 2022-07-29 – 2022-08-02 (×5): 2 g via INTRAVENOUS
  Filled 2022-07-29 (×5): qty 20

## 2022-07-29 MED ORDER — LACTASE 3000 UNITS PO TABS: 6000.0000 [IU] | ORAL_TABLET | Freq: Three times a day (TID) | ORAL | Status: DC | PRN

## 2022-07-29 NOTE — Progress Notes (Signed)
Mobility Specialist - Progress Note   07/29/22 1247  Mobility  Activity Ambulated with assistance in hallway  Level of Assistance Standby assist, set-up cues, supervision of patient - no hands on  Assistive Device Front wheel walker  Distance Ambulated (ft) 265 ft  Activity Response Tolerated well  Mobility Referral Yes  $Mobility charge 1 Mobility  Mobility Specialist Start Time (ACUTE ONLY) 1148  Mobility Specialist Stop Time (ACUTE ONLY) 1215  Mobility Specialist Time Calculation (min) (ACUTE ONLY) 27 min   Pt received in bed and agreeable to mobility. Pt was MinA from STS. No complaints during session. Pt to recliner after session with all needs met & call bell in reach.  [facility]

## 2022-07-29 NOTE — Consult Note (Signed)
Consult Note  Margaret [name] 1929/05/29  [id number].    Requesting MD: Dr. Pola Corn Chief Complaint/Reason for Consult: Abdominal pain, diverticulitis  HPI:  89 y.o. female with medical history significant for arthritis, CAD, history of colon polyps, recurrent UTIs, hypothyroidism, who presented to [facility] ED 6/5 with weakness, AMS, abdominal pain.  She has been recently treated for UTI by PCP last week.  Saw PCP in follow-up on 6/5 and was having worsening abdominal pain, weakness, AMS and therefore recommended to proceed to the emergency department.  As treatment for UTI she has completed 2 courses of ciprofloxacin outpatient starting 5/17 and 5/31.  She has history of prior intra-abdominal abscess that resolved with antibiotic treatment in 2022.This was unclear whether from remote perforated appendicitis, small bowel diverticulitis, or colonic diverticulitis.  She did follow up with Dr. Leone Payor but given resolution on CT imaging, colonoscopy was not pursued.  She denies any history of previous diverticulitis otherwise.  Her last colonoscopy was years ago, likely in [place].  She denies nausea, vomiting, diarrhea, constipation, urinary symptoms at this time.  Workup in ED significant for CT scan showing small bowel diverticulitis with 2.4 cm abscess.  She was transferred to [facility] and admitted to the medicine service.  General surgery asked to see in consultation.  Substance use: Former cigarette smoker Allergies: Augmentin-swelling, rash.  Lactose intolerant Blood thinners: None Past Surgeries: No prior abdominal surgeries   ROS: Reviewed and as above  Family History  Problem Relation Age of Onset   Arthritis Mother    COPD Mother    Prostate cancer Father    Arthritis Father    Hearing loss Father    Heart disease Father    Heart attack Father    Stroke Maternal Grandmother    Liver cancer Sister    Arthritis Brother    Prostate cancer Brother     COPD Brother    Heart disease Brother    Breast cancer Daughter    Blindness Maternal Grandfather    Stroke Maternal Grandfather    COPD Paternal Grandmother    Breast cancer Paternal Grandmother     Past Medical History:  Diagnosis Date   Allergy    Arthritis of left knee    Asthma    Carotid artery stenosis 10/17/2012   stable report in 2018- "moderate on right, minimal on left"   Carpal tunnel syndrome 2018   left.   Cervical radiculopathy 05/16/2015   DDD C4-C5, C5-C6, and C6-C7.  Facet arthropathy throughout cervical spine.  Normal foraminal narrowing secondary to uncovertebral  arthropathy is seen bilaterally at C3-C4 and C5/C6   Chicken pox    DDD (degenerative disc disease), lumbar 04/26/2015   Mild levoscoliosis, right anterior listhesis of L4 and L5.  DDD at every level.  Facet arthropathy at multiple levels.   Deviated septum    Glaucoma    both eyes, mild opened angle   Heart murmur    History of colon polyps    History of fall    History of frequent urinary tract infections    klebs- pansensitive. c. 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 Her smoking use included cigarettes. She started smoking about 72 years ago. She smoked an average of .5 packs per day. She has never used smokeless tobacco. She reports that she does not currently use alcohol after a past usage of about 5.0 standard drinks of alcohol per week. She reports that she does not use drugs.  Allergies:  Allergies  Allergen Reactions   Augmentin [Amoxicillin-Pot Clavulanate] Swelling and Rash    Swelling in face   Lactose Intolerance (Gi)     Medications Prior to Admission  Medication Sig Dispense Refill   albuterol (VENTOLIN HFA) 108 (90 Base) MCG/ACT inhaler Inhale 2 puffs into the lungs every 6 (six) hours as needed for wheezing or shortness of breath. 1 each 0   CALCIUM PO Take 1 tablet by mouth 2 (two) times daily. Slow release     cycloSPORINE (RESTASIS) 0.05 % ophthalmic emulsion 1 drop 2 (two) times daily.     denosumab (PROLIA) 60 MG/ML SOSY injection as directed Subcutaneous     folic acid (FOLVITE) 0.5 MG tablet Take 0.5 mg by mouth daily.     lactase (LACTAID) 3000 units tablet Take 2 tablets (6,000 Units total) by mouth 3 (three) times daily as needed (lactose intolerance). 60 tablet 0   levothyroxine (SYNTHROID) 50 MCG tablet Take 1 tablet (50 mcg total) by mouth daily before breakfast. 90 tablet 3   montelukast (SINGULAIR) 10 MG tablet Take 1 tablet (10 mg total) by mouth at bedtime. 90 tablet 1   Multiple Vitamins-Minerals (PRESERVISION AREDS 2) CAPS Take 1 capsule by mouth 2 (two) times daily.     MYRBETRIQ 25 MG TB24 tablet Take 1 tablet (25 mg total) by mouth daily. 30 tablet 5   polyvinyl alcohol (LIQUIFILM TEARS) 1.4 % ophthalmic solution Place 1 drop into both eyes as needed for dry eyes.     predniSONE (DELTASONE) 5 MG tablet Take 5 mg by mouth daily as needed (joint/muscle pain).     pregabalin (LYRICA) 25 MG capsule Take 1 capsule (25 mg total) by mouth at bedtime. 30 capsule 0    Blood pressure (!) 125/58,  pulse 67, temperature 98.8 F (37.1 C), temperature source Oral, resp. rate 16, height 5' (1.524 m), weight 58.6 kg, SpO2 95 %. Physical Exam: General: pleasant, WD, elderly, female who is laying in bed in NAD HEENT: head is normocephalic, atraumatic.  Sclera are noninjected.  Pupils equal and round. EOMs intact.  Ears and nose without any masses or lesions.  Mouth is pink and moist Heart: regular, rate, and rhythm.  Normal s1,s2. No obvious gallops or rubs noted.  Loud murmur noted  Lungs: CTAB, no wheezes, rhonchi, or rales noted.  Respiratory effort nonlabored Abd: soft, tender in lower central pelvis, mildly, no peritonitis  or guarding, ND, +BS, no masses, hernias, or organomegaly Psych: A&Ox3 with an appropriate affect.    Results for orders placed or performed during the hospital encounter of 07/28/22 (from the past 48 hour(s))  Basic metabolic panel     Status: Abnormal   Collection Time: 07/28/22  1:19 PM  Result Value Ref Range   Sodium 138 135 - 145 mmol/L   Potassium 3.5 3.5 - 5.1 mmol/L   Chloride 101 98 - 111 mmol/L   CO2 27 22 - 32 mmol/L   Glucose, Bld 114 (H) 70 - 99 mg/dL    Comment: Glucose reference range applies only to samples taken after fasting for at least 8 hours.   BUN 16 8 - 23 mg/dL   Creatinine, Ser 1.61 0.44 - 1.00 mg/dL   Calcium 9.3 8.9 - 09.6 mg/dL   GFR, Estimated >04 >54 mL/min    Comment: (NOTE) Calculated using the CKD-EPI Creatinine Equation (2021)    Anion gap 10 5 - 15    Comment: Performed at [facility], [address], [place], [place] [address]  CBC     Status: Abnormal   Collection Time: 07/28/22  1:19 PM  Result Value Ref Range   WBC 18.0 (H) 4.0 - 10.5 K/uL   RBC 4.59 3.87 - 5.11 MIL/uL   Hemoglobin 14.1 12.0 - 15.0 g/dL   HCT 91.4 78.2 - 95.6 %   MCV 90.6 80.0 - 100.0 fL   MCH 30.7 26.0 - 34.0 pg   MCHC 33.9 30.0 - 36.0 g/dL   RDW 21.3 08.6 - 57.8 %   Platelets 225 150 - 400 K/uL   nRBC 0.0 0.0 - 0.2 %     Comment: Performed at [facility], [address] [place], [place] [address]  SARS Coronavirus 2 by RT PCR (hospital order, performed in [facility] hospital lab) *cepheid single result test* Anterior Nasal Swab     Status: None   Collection Time: 07/28/22  2:21 PM   Specimen: Anterior Nasal Swab  Result Value Ref Range   SARS Coronavirus 2 by RT PCR NEGATIVE NEGATIVE    Comment: (NOTE) SARS-CoV-2 target nucleic acids are NOT DETECTED.  The SARS-CoV-2 RNA is generally detectable in upper and lower respiratory specimens during the acute phase of infection. The lowest concentration of SARS-CoV-2 viral copies this assay can detect is 250 copies / mL. A negative result does not preclude SARS-CoV-2 infection and should not be used as the sole basis for treatment or other patient management decisions.  A negative result may occur with improper specimen collection / handling, submission of specimen other than nasopharyngeal swab, presence of viral mutation(s) within the areas targeted by this assay, and inadequate number of viral copies (<250 copies / mL). A negative result must be combined with clinical observations, patient history, and epidemiological information.  Fact Sheet for Patients:   RoadLapTop.co.za  Fact Sheet for Healthcare Providers: http://kim-miller.com/  This test is not yet approved or  cleared by the [place] FDA and has been authorized for detection and/or diagnosis of SARS-CoV-2 by FDA under an Emergency Use Authorization (EUA).  This EUA will remain in effect (meaning this test can be used) for the duration of the COVID-19 declaration under Section 564(b)(1) of the Act, 21 U.S.C. section 360bbb-3(b)(1), unless the authorization is terminated or revoked sooner.  Performed at [facility], [address], [place], [place] [address]   Hepatic function panel     Status:  Abnormal  Collection Time: 07/28/22  3:28 PM  Result Value Ref Range   Total Protein 5.9 (L) 6.5 - 8.1 g/dL   Albumin 3.9 3.5 - 5.0 g/dL   AST 40 15 - 41 U/L   ALT 64 (H) 0 - 44 U/L   Alkaline Phosphatase 64 38 - 126 U/L   Total Bilirubin 1.5 (H) 0.3 - 1.2 mg/dL   Bilirubin, Direct 0.3 (H) 0.0 - 0.2 mg/dL   Indirect Bilirubin 1.2 (H) 0.3 - 0.9 mg/dL    Comment: Performed at [facility], [address] [place], [place] [address]  Blood culture (routine x 2)     Status: None (Preliminary result)   Collection Time: 07/28/22  3:28 PM   Specimen: BLOOD LEFT ARM  Result Value Ref Range   Specimen Description      BLOOD LEFT ARM Performed at [facility], [address], [place], [place] [address]    Special Requests      BOTTLES DRAWN AEROBIC AND ANAEROBIC Blood Culture adequate volume Performed at [facility], [address] [place], [place] [address]    Culture PENDING    Report Status PENDING   Lactic acid, plasma     Status: None   Collection Time: 07/28/22  3:28 PM  Result Value Ref Range   Lactic Acid, Venous 1.1 0.5 - 1.9 mmol/L    Comment: Performed at [facility], [address] [place], [place] [address]  CK     Status: None   Collection Time: 07/28/22  3:28 PM  Result Value Ref Range   Total CK 60 38 - 234 U/L    Comment: Performed at [facility], [address], [place], [place] [address]  Urinalysis, Routine w reflex microscopic -Urine, Clean Catch     Status: Abnormal   Collection Time: 07/28/22  3:47 PM  Result Value Ref Range   Color, Urine YELLOW YELLOW   APPearance CLEAR CLEAR   Specific Gravity, Urine 1.020 1.005 - 1.030   pH 5.5 5.0 - 8.0   Glucose, UA NEGATIVE NEGATIVE mg/dL   Hgb urine dipstick NEGATIVE NEGATIVE   Bilirubin Urine NEGATIVE NEGATIVE   Ketones, ur TRACE (A) NEGATIVE mg/dL   Protein, ur 30 (A) NEGATIVE mg/dL   Nitrite NEGATIVE NEGATIVE    Leukocytes,Ua NEGATIVE NEGATIVE   RBC / HPF 0-5 0 - 5 RBC/hpf   WBC, UA 6-10 0 - 5 WBC/hpf   Bacteria, UA NONE SEEN NONE SEEN   Squamous Epithelial / HPF 0-5 0 - 5 /HPF   Mucus PRESENT    Hyaline Casts, UA PRESENT     Comment: Performed at [facility], [address], [place], [place] [address]  Comprehensive metabolic panel     Status: Abnormal   Collection Time: 07/29/22  5:45 AM  Result Value Ref Range   Sodium 135 135 - 145 mmol/L   Potassium 3.0 (L) 3.5 - 5.1 mmol/L   Chloride 105 98 - 111 mmol/L   CO2 20 (L) 22 - 32 mmol/L   Glucose, Bld 95 70 - 99 mg/dL    Comment: Glucose reference range applies only to samples taken after fasting for at least 8 hours.   BUN 12 8 - 23 mg/dL   Creatinine, Ser 5.78 0.44 - 1.00 mg/dL   Calcium 7.5 (L) 8.9 - 10.3 mg/dL   Total Protein 5.7 (L) 6.5 - 8.1 g/dL   Albumin 3.2 (L) 3.5 - 5.0 g/dL   AST 35 15 -  41 U/L   ALT 55 (H) 0 - 44 U/L   Alkaline Phosphatase 78 38 - 126 U/L   Total Bilirubin 1.2 0.3 - 1.2 mg/dL   GFR, Estimated >95 >62 mL/min    Comment: (NOTE) Calculated using the CKD-EPI Creatinine Equation (2021)    Anion gap 10 5 - 15    Comment: Performed at [facility], [address], [place], [place] [address]  CBC     Status: Abnormal   Collection Time: 07/29/22  5:45 AM  Result Value Ref Range   WBC 12.7 (H) 4.0 - 10.5 K/uL   RBC 3.82 (L) 3.87 - 5.11 MIL/uL   Hemoglobin 11.9 (L) 12.0 - 15.0 g/dL   HCT 57.8 (L) 46.9 - 62.9 %   MCV 93.2 80.0 - 100.0 fL   MCH 31.2 26.0 - 34.0 pg   MCHC 33.4 30.0 - 36.0 g/dL   RDW 52.8 41.3 - 24.4 %   Platelets 176 150 - 400 K/uL   nRBC 0.0 0.0 - 0.2 %    Comment: Performed at [facility], [address], [place], [place] [address]   CT ABDOMEN PELVIS W CONTRAST  Result Date: 07/28/2022 CLINICAL DATA:  Nonlocalized acute abdominal pain EXAM: CT ABDOMEN AND PELVIS WITH CONTRAST TECHNIQUE: Multidetector CT imaging of the abdomen and  pelvis was performed using the standard protocol following bolus administration of intravenous contrast. RADIATION DOSE REDUCTION: This exam was performed according to the departmental dose-optimization program which includes automated exposure control, adjustment of the mA and/or kV according to patient size and/or use of iterative reconstruction technique. CONTRAST:  75mL OMNIPAQUE IOHEXOL 300 MG/ML  SOLN COMPARISON:  CT 06/12/2020 FINDINGS: Lower chest: There is some linear opacity lung bases likely scar or atelectasis. Slight pleural thickening at the left lung base. Calcifications seen along the mitral valve annulus and along the area of the aortic valve. Hepatobiliary: No focal liver abnormality is seen. No gallstones, gallbladder wall thickening, or biliary dilatation. Patent portal vein Pancreas: Unremarkable. No pancreatic ductal dilatation or surrounding inflammatory changes. Spleen: Normal in size without focal abnormality. Adrenals/Urinary Tract: Stable slight thickening of the left adrenal gland. Right adrenal gland is stable. No collecting system dilatation of either kidney. On portal venous phase there are some subtle patchy areas of incomplete enhancement along each kidney which are less apparent on the delayed dataset. No adjacent inflammatory stranding at this time. Preserved contours of the urinary bladder which is underdistended. Stomach/Bowel: No oral contrast. Large bowel has left-sided colonic diverticulosis. Scattered colonic stool diffusely. Stomach is nondilated. There is extensive edema in the central pelvic mesentery. The small bowel diffusely is nondilated but there is fold thickening along loops of small bowel in the central mesentery near the inflammatory stranding and fluid. There is a collection of mottled gas and debris in this location in the central mesentery on image 50 measuring 2.5 x 2.2 cm. Based on appearance these changes could relate to small bowel diverticulitis. A small  abscess is not excluded. No associated obstruction or widespread free air. Vascular/Lymphatic: Aortic atherosclerosis. No enlarged abdominal or pelvic lymph nodes. Reproductive: Uterus is present. Prominent periuterine vessels, left-greater-than-right. Other: No free intra-abdominal air. Musculoskeletal: Scattered degenerative changes of the spine and pelvis. Curvature of the lumbar spine. Mild listhesis. Posterior osteophytes and disc bulging identified at multiple levels, greatest at T12-L1 with central canal stenosis. IMPRESSION: Inflammatory stranding and fluid along the central pelvic mesentery with the adjacent small bowel loops with wall thickening and  edema. There are several diverticula in this location in the changes could relate to small bowel diverticulitis. There is a 2.4 cm mottled gas collection adjacent to small bowel loop in this location which could represent a small abscess. Recommend follow-up to confirm clearance and exclude secondary pathology. No obstruction, widespread free air. Separate colonic diverticulosis. Slightly heterogeneous appearance to both kidneys but no inflammatory stranding. This has have a differential including form of nephritis or vascular abnormality. Please correlate for any signs of pyelonephritis or infection. Otherwise attention on follow-up. Electronically Signed   By: Karen Kays M.D.   On: 07/28/2022 18:08   DG Chest 2 View  Result Date: 07/28/2022 CLINICAL DATA:  weakness EXAM: CHEST - 2 VIEW COMPARISON:  CXR 02/27/20 FINDINGS: No pleural effusion. No pneumothorax. Normal cardiac and mediastinal contours. No focal airspace opacity. No radiographically apparent displaced rib fractures. Visualized upper abdomen is unremarkable. Vertebral body heights are maintained. IMPRESSION: No focal airspace opacity. Electronically Signed   By: Lorenza Cambridge M.D.   On: 07/28/2022 15:18   CT Head Wo Contrast  Result Date: 07/28/2022 CLINICAL DATA:  Altered mental status,  weakness, change in walking, change in speech. EXAM: CT HEAD WITHOUT CONTRAST TECHNIQUE: Contiguous axial images were obtained from the base of the skull through the vertex without intravenous contrast. RADIATION DOSE REDUCTION: This exam was performed according to the departmental dose-optimization program which includes automated exposure control, adjustment of the mA and/or kV according to patient size and/or use of iterative reconstruction technique. COMPARISON:  None Available. FINDINGS: Brain: There is no acute intracranial hemorrhage, extra-axial fluid collection, or acute infarct. There is background parenchymal volume loss with prominence of the ventricular system and extra-axial CSF spaces, within expected limits for age. There is patchy and confluent hypodensity in the supratentorial white matter likely reflecting sequela of underlying chronic small-vessel ischemic change. There is no mass lesion. There is no mass effect or midline shift. The pituitary and suprasellar region are normal. Vascular: There is calcification of the bilateral carotid siphons. Skull: Choose Sinuses/Orbits: The imaged paranasal sinuses are clear. Bilateral lens implants are in place. The globes and orbits are otherwise unremarkable. Other: None. IMPRESSION: No acute intracranial pathology. Electronically Signed   By: Lesia Hausen M.D.   On: 07/28/2022 15:14      Assessment/Plan Small bowel diverticulitis with 2.4cm abscess  Patient seen and examined and relevant labs and imaging personally reviewed.  Workup and exam consistent with diverticulitis and associated abscess, presumed small bowel based on scan.  Abscess is small and without window for drainage, do not recommend drainage.  She has mild leukocytosis and is hemodynamically stable (normotensive, not tachycardic) and urgent/emergent surgical intervention not indicated or recommended at this time.  Recommend conservative management at this time.  However, if she fails  conservative management, she may require surgical intervention.  She has recently completed 2 courses of ciprofloxacin for UTI which has not provided sufficient coverage for intra-abdominal infection.  Agree with IV antibiotics ceftriaxone/metronidazole.  Recommend bowel rest, can have clear liquids from our standpoint.  Agree with hospitalist admission and we will follow with you.   FEN: CLD ID: [id number] VTE: heparin subq  Arthritis CAD H/o colon polyps -last colonoscopy 2010 recurrent UTIs  hypothyroidism  I reviewed ED provider notes, hospitalist notes, last 24 h vitals and pain scores, last 48 h intake and output, last 24 h labs and trends, and last 24 h imaging results.   Letha Cape, [facility] Surgery 07/29/2022, 7:39  AM Please see Amion for pager number during day hours 7:00am-4:30pm

## 2022-07-29 NOTE — Progress Notes (Signed)
Progress Note   Patient: Margaret [name] WUJ:[id number] DOB: 13-Nov-1929 DOA: 07/28/2022     1 DOS: the patient was seen and examined on 07/29/2022   Brief hospital course: 89yo female with h/o inflammatory arthritis, hypothyroidism, PVD, and h/o intraabdominal abscess (2022) presenting with weakness, myalgias, and confusion.  She was recently treated with Cipro for UTI by PCP (5/17, 5/31).  CT in the ER with acute diverticulitis with 2.4 cm abscess.  She was started on ceftriaxone and metronidazole.  Surgery is consulting.  Assessment and Plan:  Diverticulitis -Patient presented with generalized weakness, myalgias, confusion - atypical presentation -She has a prior h/o diverticulitis with abscess and her CT supports this as a diagnosis; UA is not suggestive of UTI -Her only SIRS criteria is leukocytosis (12.7) -For now, will give bowel rest (clear liquids ok, per surgery), IVF, and treat with Rocephin/Flagyl for intraabdominal infection -If not improving, she may need operative intervention; surgery is following  Inflammatory arthritis -no active flare  -Takes prednisone prn - will hold -Continue pregabalin   Hypothyroidism -resume synthroid   Asthma -no acute exacerbation  -prn albuterol -Continue Singulair   PVD -no active issues   DNR -I have discussed code status with the patient and she would not desire resuscitation and would prefer to die a natural death should that situation arise.      Subjective: She was up and about today, walked to bathroom and cleaned herself up.  Pain is only with palpation.  No n/v/d.  Physical Exam: Vitals:   07/29/22 0149 07/29/22 0547 07/29/22 1014 07/29/22 1348  BP: (!) 137/54 (!) 125/58 (!) 141/64 124/67  Pulse: 70 67 75 72  Resp: 16 16 16 14   Temp: 98.2 F (36.8 C) 98.8 F (37.1 C) 98 F (36.7 C) 98.2 F (36.8 C)  TempSrc: Oral Oral Oral Oral  SpO2: 98% 95% 98% 98%  Weight:      Height:       General:  Appears calm and  comfortable and is in NAD Eyes:  EOMI, normal lids, iris ENT:  grossly normal hearing, lips & tongue, mmm Neck:  no LAD, masses or thyromegaly Cardiovascular:  RRR, no m/r/g. No LE edema.  Respiratory:   CTA bilaterally with no wheezes/rales/rhonchi.  Normal respiratory effort. Abdomen:  soft, mildly diffusely TTP, ND Back:   normal alignment, no CVAT Skin:  no rash or induration seen on limited exam Musculoskeletal:  grossly normal tone BUE/BLE, good ROM, no bony abnormality Psychiatric:  grossly normal mood and affect, speech fluent and appropriate, AOx3 Neurologic:  CN 2-12 grossly intact, moves all extremities in coordinated fashion   Radiological Exams on Admission: Independently reviewed - see discussion in A/P where applicable  CT ABDOMEN PELVIS W CONTRAST  Result Date: 07/28/2022 CLINICAL DATA:  Nonlocalized acute abdominal pain EXAM: CT ABDOMEN AND PELVIS WITH CONTRAST TECHNIQUE: Multidetector CT imaging of the abdomen and pelvis was performed using the standard protocol following bolus administration of intravenous contrast. RADIATION DOSE REDUCTION: This exam was performed according to the departmental dose-optimization program which includes automated exposure control, adjustment of the mA and/or kV according to patient size and/or use of iterative reconstruction technique. CONTRAST:  75mL OMNIPAQUE IOHEXOL 300 MG/ML  SOLN COMPARISON:  CT 06/12/2020 FINDINGS: Lower chest: There is some linear opacity lung bases likely scar or atelectasis. Slight pleural thickening at the left lung base. Calcifications seen along the mitral valve annulus and along the area of the aortic valve. Hepatobiliary: No focal liver abnormality is seen. No  gallstones, gallbladder wall thickening, or biliary dilatation. Patent portal vein Pancreas: Unremarkable. No pancreatic ductal dilatation or surrounding inflammatory changes. Spleen: Normal in size without focal abnormality. Adrenals/Urinary Tract: Stable  slight thickening of the left adrenal gland. Right adrenal gland is stable. No collecting system dilatation of either kidney. On portal venous phase there are some subtle patchy areas of incomplete enhancement along each kidney which are less apparent on the delayed dataset. No adjacent inflammatory stranding at this time. Preserved contours of the urinary bladder which is underdistended. Stomach/Bowel: No oral contrast. Large bowel has left-sided colonic diverticulosis. Scattered colonic stool diffusely. Stomach is nondilated. There is extensive edema in the central pelvic mesentery. The small bowel diffusely is nondilated but there is fold thickening along loops of small bowel in the central mesentery near the inflammatory stranding and fluid. There is a collection of mottled gas and debris in this location in the central mesentery on image 50 measuring 2.5 x 2.2 cm. Based on appearance these changes could relate to small bowel diverticulitis. A small abscess is not excluded. No associated obstruction or widespread free air. Vascular/Lymphatic: Aortic atherosclerosis. No enlarged abdominal or pelvic lymph nodes. Reproductive: Uterus is present. Prominent periuterine vessels, left-greater-than-right. Other: No free intra-abdominal air. Musculoskeletal: Scattered degenerative changes of the spine and pelvis. Curvature of the lumbar spine. Mild listhesis. Posterior osteophytes and disc bulging identified at multiple levels, greatest at T12-L1 with central canal stenosis. IMPRESSION: Inflammatory stranding and fluid along the central pelvic mesentery with the adjacent small bowel loops with wall thickening and edema. There are several diverticula in this location in the changes could relate to small bowel diverticulitis. There is a 2.4 cm mottled gas collection adjacent to small bowel loop in this location which could represent a small abscess. Recommend follow-up to confirm clearance and exclude secondary pathology.  No obstruction, widespread free air. Separate colonic diverticulosis. Slightly heterogeneous appearance to both kidneys but no inflammatory stranding. This has have a differential including form of nephritis or vascular abnormality. Please correlate for any signs of pyelonephritis or infection. Otherwise attention on follow-up. Electronically Signed   By: Karen Kays M.D.   On: 07/28/2022 18:08   DG Chest 2 View  Result Date: 07/28/2022 CLINICAL DATA:  weakness EXAM: CHEST - 2 VIEW COMPARISON:  CXR 02/27/20 FINDINGS: No pleural effusion. No pneumothorax. Normal cardiac and mediastinal contours. No focal airspace opacity. No radiographically apparent displaced rib fractures. Visualized upper abdomen is unremarkable. Vertebral body heights are maintained. IMPRESSION: No focal airspace opacity. Electronically Signed   By: Lorenza Cambridge M.D.   On: 07/28/2022 15:18   CT Head Wo Contrast  Result Date: 07/28/2022 CLINICAL DATA:  Altered mental status, weakness, change in walking, change in speech. EXAM: CT HEAD WITHOUT CONTRAST TECHNIQUE: Contiguous axial images were obtained from the base of the skull through the vertex without intravenous contrast. RADIATION DOSE REDUCTION: This exam was performed according to the departmental dose-optimization program which includes automated exposure control, adjustment of the mA and/or kV according to patient size and/or use of iterative reconstruction technique. COMPARISON:  None Available. FINDINGS: Brain: There is no acute intracranial hemorrhage, extra-axial fluid collection, or acute infarct. There is background parenchymal volume loss with prominence of the ventricular system and extra-axial CSF spaces, within expected limits for age. There is patchy and confluent hypodensity in the supratentorial white matter likely reflecting sequela of underlying chronic small-vessel ischemic change. There is no mass lesion. There is no mass effect or midline shift. The pituitary and  suprasellar region are normal. Vascular: There is calcification of the bilateral carotid siphons. Skull: Choose Sinuses/Orbits: The imaged paranasal sinuses are clear. Bilateral lens implants are in place. The globes and orbits are otherwise unremarkable. Other: None. IMPRESSION: No acute intracranial pathology. Electronically Signed   By: Lesia Hausen M.D.   On: 07/28/2022 15:14    EKG: Independently reviewed.  NSR with rate 81; no evidence of acute ischemia   Labs on Admission: I have personally reviewed the available labs and imaging studies at the time of the admission.  Pertinent labs:    K+ 3.0 Albumin 3.2 AST 35/ALT 55 WBC 12.6; 18 on 6/5 Hgb 11.9 COVID negative UA: trace ketones, 30 protein Blood cultures pending  Family Communication: None present; I spoke with her daughter by telephone  Disposition: Status is: Inpatient Remains inpatient appropriate because: ongoing IV antibiotics  Planned Discharge Destination: Home    Time spent: 35 minutes  Author: Jonah Blue, MD 07/29/2022 3:55 PM  For on call review [email or web address].

## 2022-07-29 NOTE — Plan of Care (Signed)

## 2022-07-29 NOTE — TOC CM/SW Note (Signed)
Transition of Care [facility]) - Inpatient Brief Assessment   Patient Details  Name: [name] MRN: [id number] Date of Birth: 18-Jun-1929  Transition of [facility]) CM/SW Contact:    Durenda Guthrie, RN Phone Number: 07/29/2022, 8:51 AM   Clinical Narrative:    Transition of Care Asessment: Insurance and Status: Insurance coverage has been reviewed Patient has primary care physician: Yes Felix Pacini DO) Home environment has been reviewed: shares home  with her daughter Ranita Springer Prior level of function:: independent Prior/Current Home Services: No current home services Social Determinants of Health Reivew: SDOH reviewed no interventions necessary Readmission risk has been reviewed: Yes Transition of care needs: no transition of care needs at this time

## 2022-07-29 NOTE — Hospital Course (Signed)
89yo female with h/o inflammatory arthritis, hypothyroidism, PVD, and h/o intraabdominal abscess (2022) presenting with weakness, myalgias, and confusion.  She was recently treated with Cipro for UTI by PCP (5/17, 5/31).  CT in the ER with acute diverticulitis with 2.4 cm abscess.  She was started on ceftriaxone and metronidazole.  Surgery is consulting.

## 2022-07-30 DIAGNOSIS — K5792 Diverticulitis of intestine, part unspecified, without perforation or abscess without bleeding: Secondary | ICD-10-CM | POA: Diagnosis not present

## 2022-07-30 LAB — URINE CULTURE: MICRO NUMBER:: 15043919

## 2022-07-30 LAB — COMPREHENSIVE METABOLIC PANEL
ALT: 42 U/L (ref 0–44)
AST: 37 U/L (ref 15–41)
Albumin: 3.1 g/dL — ABNORMAL LOW (ref 3.5–5.0)
Alkaline Phosphatase: 92 U/L (ref 38–126)
Anion gap: 10 (ref 5–15)
BUN: 6 mg/dL — ABNORMAL LOW (ref 8–23)
CO2: 23 mmol/L (ref 22–32)
Calcium: 8 mg/dL — ABNORMAL LOW (ref 8.9–10.3)
Chloride: 108 mmol/L (ref 98–111)
Creatinine, Ser: 0.69 mg/dL (ref 0.44–1.00)
GFR, Estimated: >60 mL/min (ref >60)
Glucose, Bld: 108 mg/dL — ABNORMAL HIGH (ref 70–99)
Potassium: 4.3 mmol/L (ref 3.5–5.1)
Sodium: 141 mmol/L (ref 135–145)
Total Bilirubin: 1.4 mg/dL — ABNORMAL HIGH (ref 0.3–1.2)
Total Protein: 5.9 g/dL — ABNORMAL LOW (ref 6.5–8.1)

## 2022-07-30 LAB — CBC
HCT: 35.9 % — ABNORMAL LOW (ref 36.0–46.0)
Hemoglobin: 11.8 g/dL — ABNORMAL LOW (ref 12.0–15.0)
MCH: 31.1 pg (ref 26.0–34.0)
MCHC: 32.9 g/dL (ref 30.0–36.0)
MCV: 94.5 fL (ref 80.0–100.0)
Platelets: 198 10*3/uL (ref 150–400)
RBC: 3.8 MIL/uL — ABNORMAL LOW (ref 3.87–5.11)
RDW: 12 % (ref 11.5–15.5)
WBC: 7.7 10*3/uL (ref 4.0–10.5)
nRBC: 0 % (ref 0.0–0.2)

## 2022-07-30 LAB — PAT ID TIQ DOC

## 2022-07-30 LAB — GLUCOSE, CAPILLARY: Glucose-Capillary: 98 mg/dL (ref 70–99)

## 2022-07-30 MED ORDER — SODIUM CHLORIDE 0.9 % IV SOLN
1000.0000 mL | INTRAVENOUS | Status: AC
  Administered 2022-07-30: 1000 mL via INTRAVENOUS

## 2022-07-30 NOTE — Progress Notes (Signed)
PROGRESS NOTE    Margaret [name]  ZOX:[id number] DOB: [birth year] DOA: 07/28/2022 PCP: [name], DO   Brief Narrative:    89yo female with h/o inflammatory arthritis, hypothyroidism, PVD, and h/o intraabdominal abscess (2022) presenting with weakness, myalgias, and confusion. She was recently treated with Cipro for UTI by PCP (5/17, 5/31). CT in the ER with acute diverticulitis with 2.4 cm abscess. She was started on ceftriaxone and metronidazole. Surgery is consulting.   Assessment & Plan:   Principal Problem:   Acute diverticulitis Active Problems:   Hypothyroidism   Asthma   Seronegative rheumatoid arthritis (HCC)   DNR (do not resuscitate)  Assessment and Plan:   Diverticulitis -Patient presented with generalized weakness, myalgias, confusion - atypical presentation -She has a prior h/o diverticulitis with abscess and her CT supports this as a diagnosis; UA is not suggestive of UTI -Her only SIRS criteria is leukocytosis (12.7) -For now, will give bowel rest (clear liquids ok, per surgery), IVF, and treat with Rocephin/Flagyl for intraabdominal infection -Surgery planning for conservative management, advance diet per general surgery   Inflammatory arthritis -no active flare  -Takes prednisone prn - will hold -Continue pregabalin   Hypothyroidism -resume synthroid   Asthma -no acute exacerbation  -prn albuterol -Continue Singulair   PVD -no active issues    DNR -I have discussed code status with the patient and she would not desire resuscitation and would prefer to die a natural death should that situation arise.    DVT prophylaxis:Heparin Code Status: DNR Family Communication: None at bedside Disposition Plan: Advance diet as tolerated Status is: Inpatient Remains inpatient appropriate because: Need for IV medications  Consultants:  General Surgery  Procedures:  None  Antimicrobials:  Anti-infectives (From admission, onward)    Start      Dose/Rate Route Frequency Ordered Stop   07/29/22 1900  cefTRIAXone (ROCEPHIN) 2 g in sodium chloride 0.9 % 100 mL IVPB        2 g 200 mL/hr over 30 Minutes Intravenous Every 24 hours 07/29/22 0517     07/29/22 0700  metroNIDAZOLE (FLAGYL) IVPB 500 mg        500 mg 100 mL/hr over 60 Minutes Intravenous Every 12 hours 07/29/22 0517     07/28/22 1830  cefTRIAXone (ROCEPHIN) 2 g in sodium chloride 0.9 % 100 mL IVPB        2 g 200 mL/hr over 30 Minutes Intravenous  Once 07/28/22 1821 07/28/22 1932   07/28/22 1830  metroNIDAZOLE (FLAGYL) IVPB 500 mg        500 mg 100 mL/hr over 60 Minutes Intravenous  Once 07/28/22 1821 07/28/22 2000       Subjective: Patient seen and evaluated today and is able to tolerate clear liquid diet.  Had a bowel movement yesterday and denies any significant abdominal pain.  Objective: Vitals:   07/29/22 1014 07/29/22 1348 07/29/22 1923 07/30/22 0528  BP: (!) 141/64 124/67 (!) 145/65 (!) 148/64  Pulse: 75 72 75 70  Resp: 16 14 16 16   Temp: 98 F (36.7 C) 98.2 F (36.8 C) 98.2 F (36.8 C) 97.9 F (36.6 C)  TempSrc: Oral Oral Oral Oral  SpO2: 98% 98% 100% 97%  Weight:      Height:        Intake/Output Summary (Last 24 hours) at 07/30/2022 0950 Last data filed at 07/30/2022 0500 Gross per 24 hour  Intake 1744.62 ml  Output --  Net 1744.62 ml   [employer]   07/28/22  2157  Weight: 58.6 kg    Examination:  General exam: Appears calm and comfortable  Respiratory system: Clear to auscultation. Respiratory effort normal. Cardiovascular system: S1 & S2 heard, RRR.  Gastrointestinal system: Abdomen is soft Central nervous system: Alert and awake Extremities: No edema Skin: No significant lesions noted Psychiatry: Flat affect.    Data Reviewed: I have personally reviewed following labs and imaging studies  CBC: Recent Labs  Lab 07/28/22 1319 07/29/22 0545 07/30/22 0909  WBC 18.0* 12.7* 7.7  HGB 14.1 11.9* 11.8*  HCT 41.6 35.6* 35.9*   MCV 90.6 93.2 94.5  PLT 225 176 198   Basic Metabolic Panel: Recent Labs  Lab 07/28/22 1319 07/29/22 0545  NA 138 135  K 3.5 3.0*  CL 101 105  CO2 27 20*  GLUCOSE 114* 95  BUN 16 12  CREATININE 0.76 0.76  CALCIUM 9.3 7.5*   GFR: Estimated Creatinine Clearance: 37.4 mL/min (by C-G formula based on SCr of 0.76 mg/dL). Liver Function Tests: Recent Labs  Lab 07/28/22 1528 07/29/22 0545  AST 40 35  ALT 64* 55*  ALKPHOS 64 78  BILITOT 1.5* 1.2  PROT 5.9* 5.7*  ALBUMIN 3.9 3.2*   No results for input(s): "LIPASE", "AMYLASE" in the last 168 hours. No results for input(s): "AMMONIA" in the last 168 hours. Coagulation Profile: No results for input(s): "INR", "PROTIME" in the last 168 hours. Cardiac Enzymes: Recent Labs  Lab 07/28/22 1528  CKTOTAL 60   BNP (last 3 results) No results for input(s): "PROBNP" in the last 8760 hours. HbA1C: No results for input(s): "HGBA1C" in the last 72 hours. CBG: Recent Labs  Lab 07/29/22 0827 07/30/22 0743  GLUCAP 75 98   Lipid Profile: No results for input(s): "CHOL", "HDL", "LDLCALC", "TRIG", "CHOLHDL", "LDLDIRECT" in the last 72 hours. Thyroid Function Tests: No results for input(s): "TSH", "T4TOTAL", "FREET4", "T3FREE", "THYROIDAB" in the last 72 hours. Anemia Panel: No results for input(s): "VITAMINB12", "FOLATE", "FERRITIN", "TIBC", "IRON", "RETICCTPCT" in the last 72 hours. Sepsis Labs: Recent Labs  Lab 07/28/22 1528  LATICACIDVEN 1.1    Recent Results (from the past 240 hour(s))  Urine Culture     Status: Abnormal   Collection Time: 07/23/22 11:38 AM   Specimen: Urine  Result Value Ref Range Status   MICRO NUMBER: [id number]  Final   SPECIMEN QUALITY: Adequate  Final   Sample Source URINE  Final   STATUS: FINAL  Final   ISOLATE 1: Escherichia coli (A)  Final    Comment: Greater than 100,000 CFU/mL of Escherichia coli      Susceptibility   Escherichia coli - URINE CULTURE, REFLEX    AMOX/CLAVULANIC 4  Sensitive     AMPICILLIN 4 Sensitive     AMPICILLIN/SULBACTAM <=2 Sensitive     CEFAZOLIN* <=4 Not Reportable      * For infections other than uncomplicated UTI caused by E. coli, K. pneumoniae or P. mirabilis: Cefazolin is resistant if MIC > or = 8 mcg/mL. (Distinguishing susceptible versus intermediate for isolates with MIC < or = 4 mcg/mL requires additional testing.) For uncomplicated UTI caused by E. coli, K. pneumoniae or P. mirabilis: Cefazolin is susceptible if MIC <32 mcg/mL and predicts susceptible to the oral agents cefaclor, cefdinir, cefpodoxime, cefprozil, cefuroxime, cephalexin and loracarbef.     CEFTAZIDIME <=1 Sensitive     CEFEPIME <=1 Sensitive     CEFTRIAXONE <=1 Sensitive     CIPROFLOXACIN >=4 Resistant     LEVOFLOXACIN >=8 Resistant  GENTAMICIN <=1 Sensitive     IMIPENEM <=0.25 Sensitive     NITROFURANTOIN <=16 Sensitive     PIP/TAZO <=4 Sensitive     TOBRAMYCIN <=1 Sensitive     TRIMETH/SULFA* <=20 Sensitive      * For infections other than uncomplicated UTI caused by E. coli, K. pneumoniae or P. mirabilis: Cefazolin is resistant if MIC > or = 8 mcg/mL. (Distinguishing susceptible versus intermediate for isolates with MIC < or = 4 mcg/mL requires additional testing.) For uncomplicated UTI caused by E. coli, K. pneumoniae or P. mirabilis: Cefazolin is susceptible if MIC <32 mcg/mL and predicts susceptible to the oral agents cefaclor, cefdinir, cefpodoxime, cefprozil, cefuroxime, cephalexin and loracarbef. Legend: S = Susceptible  I = Intermediate R = Resistant  NS = Not susceptible * = Not tested  NR = Not reported **NN = See antimicrobic comments   SARS Coronavirus 2 by RT PCR (hospital order, performed in [facility] hospital lab) *cepheid single result test* Anterior Nasal Swab     Status: None   Collection Time: 07/28/22  2:21 PM   Specimen: Anterior Nasal Swab  Result Value Ref Range Status   SARS Coronavirus 2 by RT PCR NEGATIVE  NEGATIVE Final    Comment: (NOTE) SARS-CoV-2 target nucleic acids are NOT DETECTED.  The SARS-CoV-2 RNA is generally detectable in upper and lower respiratory specimens during the acute phase of infection. The lowest concentration of SARS-CoV-2 viral copies this assay can detect is 250 copies / mL. A negative result does not preclude SARS-CoV-2 infection and should not be used as the sole basis for treatment or other patient management decisions.  A negative result may occur with improper specimen collection / handling, submission of specimen other than nasopharyngeal swab, presence of viral mutation(s) within the areas targeted by this assay, and inadequate number of viral copies (<250 copies / mL). A negative result must be combined with clinical observations, patient history, and epidemiological information.  Fact Sheet for Patients:   RoadLapTop.co.za  Fact Sheet for Healthcare Providers: http://kim-miller.com/  This test is not yet approved or  cleared by the [place] FDA and has been authorized for detection and/or diagnosis of SARS-CoV-2 by FDA under an Emergency Use Authorization (EUA).  This EUA will remain in effect (meaning this test can be used) for the duration of the COVID-19 declaration under Section 564(b)(1) of the Act, 21 U.S.C. section 360bbb-3(b)(1), unless the authorization is terminated or revoked sooner.  Performed at [facility], [address] [place], [place] [address]   Blood culture (routine x 2)     Status: None (Preliminary result)   Collection Time: 07/28/22  3:28 PM   Specimen: BLOOD LEFT ARM  Result Value Ref Range Status   Specimen Description   Final    BLOOD LEFT ARM Performed at [facility], [address] [place], [place] [address]    Special Requests   Final    BOTTLES DRAWN AEROBIC AND ANAEROBIC Blood Culture adequate volume Performed at [facility], [address], [place], [place] [address]    Culture   Final    NO GROWTH < 24 HOURS Performed at [facility], [address] [place], [place] [address]    Report Status PENDING  Incomplete         Radiology Studies: CT ABDOMEN PELVIS W CONTRAST  Result Date: 07/28/2022 CLINICAL DATA:  Nonlocalized acute abdominal pain EXAM: CT ABDOMEN AND PELVIS WITH CONTRAST TECHNIQUE: Multidetector CT  imaging of the abdomen and pelvis was performed using the standard protocol following bolus administration of intravenous contrast. RADIATION DOSE REDUCTION: This exam was performed according to the departmental dose-optimization program which includes automated exposure control, adjustment of the mA and/or kV according to patient size and/or use of iterative reconstruction technique. CONTRAST:  75mL OMNIPAQUE IOHEXOL 300 MG/ML  SOLN COMPARISON:  CT 06/12/2020 FINDINGS: Lower chest: There is some linear opacity lung bases likely scar or atelectasis. Slight pleural thickening at the left lung base. Calcifications seen along the mitral valve annulus and along the area of the aortic valve. Hepatobiliary: No focal liver abnormality is seen. No gallstones, gallbladder wall thickening, or biliary dilatation. Patent portal vein Pancreas: Unremarkable. No pancreatic ductal dilatation or surrounding inflammatory changes. Spleen: Normal in size without focal abnormality. Adrenals/Urinary Tract: Stable slight thickening of the left adrenal gland. Right adrenal gland is stable. No collecting system dilatation of either kidney. On portal venous phase there are some subtle patchy areas of incomplete enhancement along each kidney which are less apparent on the delayed dataset. No adjacent inflammatory stranding at this time. Preserved contours of the urinary bladder which is underdistended. Stomach/Bowel: No oral contrast. Large bowel has left-sided colonic diverticulosis. Scattered colonic stool diffusely.  Stomach is nondilated. There is extensive edema in the central pelvic mesentery. The small bowel diffusely is nondilated but there is fold thickening along loops of small bowel in the central mesentery near the inflammatory stranding and fluid. There is a collection of mottled gas and debris in this location in the central mesentery on image 50 measuring 2.5 x 2.2 cm. Based on appearance these changes could relate to small bowel diverticulitis. A small abscess is not excluded. No associated obstruction or widespread free air. Vascular/Lymphatic: Aortic atherosclerosis. No enlarged abdominal or pelvic lymph nodes. Reproductive: Uterus is present. Prominent periuterine vessels, left-greater-than-right. Other: No free intra-abdominal air. Musculoskeletal: Scattered degenerative changes of the spine and pelvis. Curvature of the lumbar spine. Mild listhesis. Posterior osteophytes and disc bulging identified at multiple levels, greatest at T12-L1 with central canal stenosis. IMPRESSION: Inflammatory stranding and fluid along the central pelvic mesentery with the adjacent small bowel loops with wall thickening and edema. There are several diverticula in this location in the changes could relate to small bowel diverticulitis. There is a 2.4 cm mottled gas collection adjacent to small bowel loop in this location which could represent a small abscess. Recommend follow-up to confirm clearance and exclude secondary pathology. No obstruction, widespread free air. Separate colonic diverticulosis. Slightly heterogeneous appearance to both kidneys but no inflammatory stranding. This has have a differential including form of nephritis or vascular abnormality. Please correlate for any signs of pyelonephritis or infection. Otherwise attention on follow-up. Electronically Signed   By: Karen Kays M.D.   On: 07/28/2022 18:08   DG Chest 2 View  Result Date: 07/28/2022 CLINICAL DATA:  weakness EXAM: CHEST - 2 VIEW COMPARISON:  CXR  02/27/20 FINDINGS: No pleural effusion. No pneumothorax. Normal cardiac and mediastinal contours. No focal airspace opacity. No radiographically apparent displaced rib fractures. Visualized upper abdomen is unremarkable. Vertebral body heights are maintained. IMPRESSION: No focal airspace opacity. Electronically Signed   By: Lorenza Cambridge M.D.   On: 07/28/2022 15:18   CT Head Wo Contrast  Result Date: 07/28/2022 CLINICAL DATA:  Altered mental status, weakness, change in walking, change in speech. EXAM: CT HEAD WITHOUT CONTRAST TECHNIQUE: Contiguous axial images were obtained from the base of the skull through the vertex without intravenous contrast.  RADIATION DOSE REDUCTION: This exam was performed according to the departmental dose-optimization program which includes automated exposure control, adjustment of the mA and/or kV according to patient size and/or use of iterative reconstruction technique. COMPARISON:  None Available. FINDINGS: Brain: There is no acute intracranial hemorrhage, extra-axial fluid collection, or acute infarct. There is background parenchymal volume loss with prominence of the ventricular system and extra-axial CSF spaces, within expected limits for age. There is patchy and confluent hypodensity in the supratentorial white matter likely reflecting sequela of underlying chronic small-vessel ischemic change. There is no mass lesion. There is no mass effect or midline shift. The pituitary and suprasellar region are normal. Vascular: There is calcification of the bilateral carotid siphons. Skull: Choose Sinuses/Orbits: The imaged paranasal sinuses are clear. Bilateral lens implants are in place. The globes and orbits are otherwise unremarkable. Other: None. IMPRESSION: No acute intracranial pathology. Electronically Signed   By: Lesia Hausen M.D.   On: 07/28/2022 15:14        Scheduled Meds:  cycloSPORINE  1 drop Both Eyes BID   folic acid  0.5 mg Oral Daily   heparin  5,000 Units  Subcutaneous Q8H   levothyroxine  50 mcg Oral QAC breakfast   montelukast  10 mg Oral QHS   multivitamin  1 tablet Oral BID   pregabalin  25 mg Oral QHS   Continuous Infusions:  sodium chloride Stopped (07/28/22 2124)   cefTRIAXone (ROCEPHIN)  IV 2 g (07/29/22 1800)   metronidazole 500 mg (07/30/22 0800)     LOS: 2 days    Time spent: 35 minutes    Melchor Kirchgessner Hoover Brunette, DO [facility]  If 7PM-7AM, please contact night-coverage [email or web address] 07/30/2022, 9:50 AM

## 2022-07-30 NOTE — Progress Notes (Signed)
Subjective/Chief Complaint: Having bowel function, feels better, less pain, no n/v states absolutely does not want surgery- when asked if it ended up being a life /death situation with perforation she would consider   Objective: Vital signs in last 24 hours: Temp:  [97.9 F (36.6 C)-98.2 F (36.8 C)] 97.9 F (36.6 C) (06/07 0528) Pulse Rate:  [70-75] 70 (06/07 0528) Resp:  [14-16] 16 (06/07 0528) BP: (124-148)/(64-67) 148/64 (06/07 0528) SpO2:  [97 %-100 %] 97 % (06/07 0528)    Intake/Output from previous day: 06/06 0701 - 06/07 0700 In: 1744.6 [P.O.:720; I.V.:900; IV Piggyback:124.6] Out: -  Intake/Output this shift: No intake/output data recorded.  Ab soft minimally tender nondistended  Lab Results:  Recent Labs    07/28/22 1319 07/29/22 0545  WBC 18.0* 12.7*  HGB 14.1 11.9*  HCT 41.6 35.6*  PLT 225 176   BMET Recent Labs    07/28/22 1319 07/29/22 0545  NA 138 135  K 3.5 3.0*  CL 101 105  CO2 27 20*  GLUCOSE 114* 95  BUN 16 12  CREATININE 0.76 0.76  CALCIUM 9.3 7.5*   PT/INR No results for input(s): "LABPROT", "INR" in the last 72 hours. ABG No results for input(s): "PHART", "HCO3" in the last 72 hours.  Invalid input(s): "PCO2", "PO2"  Studies/Results: CT ABDOMEN PELVIS W CONTRAST  Result Date: 07/28/2022 CLINICAL DATA:  Nonlocalized acute abdominal pain EXAM: CT ABDOMEN AND PELVIS WITH CONTRAST TECHNIQUE: Multidetector CT imaging of the abdomen and pelvis was performed using the standard protocol following bolus administration of intravenous contrast. RADIATION DOSE REDUCTION: This exam was performed according to the departmental dose-optimization program which includes automated exposure control, adjustment of the mA and/or kV according to patient size and/or use of iterative reconstruction technique. CONTRAST:  75mL OMNIPAQUE IOHEXOL 300 MG/ML  SOLN COMPARISON:  CT 06/12/2020 FINDINGS: Lower chest: There is some linear opacity lung bases likely  scar or atelectasis. Slight pleural thickening at the left lung base. Calcifications seen along the mitral valve annulus and along the area of the aortic valve. Hepatobiliary: No focal liver abnormality is seen. No gallstones, gallbladder wall thickening, or biliary dilatation. Patent portal vein Pancreas: Unremarkable. No pancreatic ductal dilatation or surrounding inflammatory changes. Spleen: Normal in size without focal abnormality. Adrenals/Urinary Tract: Stable slight thickening of the left adrenal gland. Right adrenal gland is stable. No collecting system dilatation of either kidney. On portal venous phase there are some subtle patchy areas of incomplete enhancement along each kidney which are less apparent on the delayed dataset. No adjacent inflammatory stranding at this time. Preserved contours of the urinary bladder which is underdistended. Stomach/Bowel: No oral contrast. Large bowel has left-sided colonic diverticulosis. Scattered colonic stool diffusely. Stomach is nondilated. There is extensive edema in the central pelvic mesentery. The small bowel diffusely is nondilated but there is fold thickening along loops of small bowel in the central mesentery near the inflammatory stranding and fluid. There is a collection of mottled gas and debris in this location in the central mesentery on image 50 measuring 2.5 x 2.2 cm. Based on appearance these changes could relate to small bowel diverticulitis. A small abscess is not excluded. No associated obstruction or widespread free air. Vascular/Lymphatic: Aortic atherosclerosis. No enlarged abdominal or pelvic lymph nodes. Reproductive: Uterus is present. Prominent periuterine vessels, left-greater-than-right. Other: No free intra-abdominal air. Musculoskeletal: Scattered degenerative changes of the spine and pelvis. Curvature of the lumbar spine. Mild listhesis. Posterior osteophytes and disc bulging identified at multiple levels, greatest  at T12-L1 with  central canal stenosis. IMPRESSION: Inflammatory stranding and fluid along the central pelvic mesentery with the adjacent small bowel loops with wall thickening and edema. There are several diverticula in this location in the changes could relate to small bowel diverticulitis. There is a 2.4 cm mottled gas collection adjacent to small bowel loop in this location which could represent a small abscess. Recommend follow-up to confirm clearance and exclude secondary pathology. No obstruction, widespread free air. Separate colonic diverticulosis. Slightly heterogeneous appearance to both kidneys but no inflammatory stranding. This has have a differential including form of nephritis or vascular abnormality. Please correlate for any signs of pyelonephritis or infection. Otherwise attention on follow-up. Electronically Signed   By: Karen Kays M.D.   On: 07/28/2022 18:08   DG Chest 2 View  Result Date: 07/28/2022 CLINICAL DATA:  weakness EXAM: CHEST - 2 VIEW COMPARISON:  CXR 02/27/20 FINDINGS: No pleural effusion. No pneumothorax. Normal cardiac and mediastinal contours. No focal airspace opacity. No radiographically apparent displaced rib fractures. Visualized upper abdomen is unremarkable. Vertebral body heights are maintained. IMPRESSION: No focal airspace opacity. Electronically Signed   By: Lorenza Cambridge M.D.   On: 07/28/2022 15:18   CT Head Wo Contrast  Result Date: 07/28/2022 CLINICAL DATA:  Altered mental status, weakness, change in walking, change in speech. EXAM: CT HEAD WITHOUT CONTRAST TECHNIQUE: Contiguous axial images were obtained from the base of the skull through the vertex without intravenous contrast. RADIATION DOSE REDUCTION: This exam was performed according to the departmental dose-optimization program which includes automated exposure control, adjustment of the mA and/or kV according to patient size and/or use of iterative reconstruction technique. COMPARISON:  None Available. FINDINGS: Brain:  There is no acute intracranial hemorrhage, extra-axial fluid collection, or acute infarct. There is background parenchymal volume loss with prominence of the ventricular system and extra-axial CSF spaces, within expected limits for age. There is patchy and confluent hypodensity in the supratentorial white matter likely reflecting sequela of underlying chronic small-vessel ischemic change. There is no mass lesion. There is no mass effect or midline shift. The pituitary and suprasellar region are normal. Vascular: There is calcification of the bilateral carotid siphons. Skull: Choose Sinuses/Orbits: The imaged paranasal sinuses are clear. Bilateral lens implants are in place. The globes and orbits are otherwise unremarkable. Other: None. IMPRESSION: No acute intracranial pathology. Electronically Signed   By: Lesia Hausen M.D.   On: 07/28/2022 15:14    Anti-infectives: Anti-infectives (From admission, onward)    Start     Dose/Rate Route Frequency Ordered Stop   07/29/22 1900  cefTRIAXone (ROCEPHIN) 2 g in sodium chloride 0.9 % 100 mL IVPB        2 g 200 mL/hr over 30 Minutes Intravenous Every 24 hours 07/29/22 0517     07/29/22 0700  metroNIDAZOLE (FLAGYL) IVPB 500 mg        500 mg 100 mL/hr over 60 Minutes Intravenous Every 12 hours 07/29/22 0517     07/28/22 1830  cefTRIAXone (ROCEPHIN) 2 g in sodium chloride 0.9 % 100 mL IVPB        2 g 200 mL/hr over 30 Minutes Intravenous  Once 07/28/22 1821 07/28/22 1932   07/28/22 1830  metroNIDAZOLE (FLAGYL) IVPB 500 mg        500 mg 100 mL/hr over 60 Minutes Intravenous  Once 07/28/22 1821 07/28/22 2000       Assessment/Plan: Small bowel diverticulitis with 2.4cm abscess  -cont abx -continue clears for today -no urgent  needs for surgery   FEN: CLD ID: [email or web address] VTE: heparin subq  I reviewed hospitalist notes, last 24 h vitals and pain scores, last 48 h intake and output, last 24 h labs and trends, and last 24 h imaging  results.    Emelia Loron 07/30/2022

## 2022-07-31 DIAGNOSIS — K5792 Diverticulitis of intestine, part unspecified, without perforation or abscess without bleeding: Secondary | ICD-10-CM | POA: Diagnosis not present

## 2022-07-31 LAB — CBC
HCT: 35.6 % — ABNORMAL LOW (ref 36.0–46.0)
Hemoglobin: 11.6 g/dL — ABNORMAL LOW (ref 12.0–15.0)
MCH: 30.7 pg (ref 26.0–34.0)
MCHC: 32.6 g/dL (ref 30.0–36.0)
MCV: 94.2 fL (ref 80.0–100.0)
Platelets: 227 10*3/uL (ref 150–400)
RBC: 3.78 MIL/uL — ABNORMAL LOW (ref 3.87–5.11)
RDW: 11.9 % (ref 11.5–15.5)
WBC: 5 10*3/uL (ref 4.0–10.5)
nRBC: 0 % (ref 0.0–0.2)

## 2022-07-31 LAB — COMPREHENSIVE METABOLIC PANEL
ALT: 40 U/L (ref 0–44)
AST: 33 U/L (ref 15–41)
Albumin: 3 g/dL — ABNORMAL LOW (ref 3.5–5.0)
Alkaline Phosphatase: 90 U/L (ref 38–126)
Anion gap: 10 (ref 5–15)
BUN: 6 mg/dL — ABNORMAL LOW (ref 8–23)
CO2: 24 mmol/L (ref 22–32)
Calcium: 7.8 mg/dL — ABNORMAL LOW (ref 8.9–10.3)
Chloride: 105 mmol/L (ref 98–111)
Creatinine, Ser: 0.67 mg/dL (ref 0.44–1.00)
GFR, Estimated: >60 mL/min (ref >60)
Glucose, Bld: 94 mg/dL (ref 70–99)
Potassium: 3.4 mmol/L — ABNORMAL LOW (ref 3.5–5.1)
Sodium: 139 mmol/L (ref 135–145)
Total Bilirubin: 0.5 mg/dL (ref 0.3–1.2)
Total Protein: 5.8 g/dL — ABNORMAL LOW (ref 6.5–8.1)

## 2022-07-31 LAB — GLUCOSE, CAPILLARY: Glucose-Capillary: 93 mg/dL (ref 70–99)

## 2022-07-31 LAB — MAGNESIUM: Magnesium: 1.9 mg/dL (ref 1.7–2.4)

## 2022-07-31 MED ORDER — POTASSIUM CHLORIDE CRYS ER 20 MEQ PO TBCR
40.0000 meq | EXTENDED_RELEASE_TABLET | Freq: Two times a day (BID) | ORAL | Status: AC
  Administered 2022-07-31 (×2): 40 meq via ORAL
  Filled 2022-07-31 (×2): qty 2

## 2022-07-31 NOTE — Progress Notes (Signed)
PROGRESS NOTE    Margaret [name]  ZOX:[id number] DOB: 14-Jul-1929 DOA: 07/28/2022 PCP: [name], DO   Brief Narrative:    89yo female with h/o inflammatory arthritis, hypothyroidism, PVD, and h/o intraabdominal abscess (2022) presenting with weakness, myalgias, and confusion. She was recently treated with Cipro for UTI by PCP (5/17, 5/31). CT in the ER with acute diverticulitis with 2.4 cm abscess. She was started on ceftriaxone and metronidazole. Surgery is consulting and working on gradual dietary advancement.   Assessment & Plan:   Principal Problem:   Acute diverticulitis Active Problems:   Hypothyroidism   Asthma   Seronegative rheumatoid arthritis (HCC)   DNR (do not resuscitate)  Assessment and Plan:   Diverticulitis -Patient presented with generalized weakness, myalgias, confusion - atypical presentation -She has a prior h/o diverticulitis with abscess and her CT supports this as a diagnosis; UA is not suggestive of UTI -Her only SIRS criteria is leukocytosis (12.7) -For now, will give bowel rest (clear liquids ok, per surgery), IVF, and treat with Rocephin/Flagyl for intraabdominal infection -Surgery planning for conservative management, advance diet per general surgery, now to full liquids   Inflammatory arthritis -no active flare  -Takes prednisone prn - will hold -Continue pregabalin  Mild hypokalemia -Replete and reevaluate in a.m.   Hypothyroidism -resume synthroid   Asthma -no acute exacerbation  -prn albuterol -Continue Singulair   PVD -no active issues    DNR -I have discussed code status with the patient and she would not desire resuscitation and would prefer to die a natural death should that situation arise.    DVT prophylaxis:Heparin Code Status: DNR Family Communication: None at bedside Disposition Plan: Advance diet as tolerated Status is: Inpatient Remains inpatient appropriate because: Need for IV medications  Consultants:   General Surgery  Procedures:  None  Antimicrobials:  Anti-infectives (From admission, onward)    Start     Dose/Rate Route Frequency Ordered Stop   07/29/22 1900  cefTRIAXone (ROCEPHIN) 2 g in sodium chloride 0.9 % 100 mL IVPB        2 g 200 mL/hr over 30 Minutes Intravenous Every 24 hours 07/29/22 0517     07/29/22 0700  metroNIDAZOLE (FLAGYL) IVPB 500 mg        500 mg 100 mL/hr over 60 Minutes Intravenous Every 12 hours 07/29/22 0517     07/28/22 1830  cefTRIAXone (ROCEPHIN) 2 g in sodium chloride 0.9 % 100 mL IVPB        2 g 200 mL/hr over 30 Minutes Intravenous  Once 07/28/22 1821 07/28/22 1932   07/28/22 1830  metroNIDAZOLE (FLAGYL) IVPB 500 mg        500 mg 100 mL/hr over 60 Minutes Intravenous  Once 07/28/22 1821 07/28/22 2000       Subjective: Patient seen and evaluated today and is able to tolerate clear liquid diet.  Had a bowel movement yesterday morning and denies any significant abdominal pain.  Objective: Vitals:   07/30/22 2000 07/31/22 0441 07/31/22 0500 07/31/22 0617  BP: (!) 145/76 (!) 154/71    Pulse: 60 64    Resp: 15 16    Temp: 97.9 F (36.6 C) 97.7 F (36.5 C)    TempSrc: Oral Oral    SpO2: 98% 97%    Weight:   60.6 kg 58.9 kg  Height:        Intake/Output Summary (Last 24 hours) at 07/31/2022 0940 Last data filed at 07/31/2022 0300 Gross per 24 hour  Intake 1047.53 ml  Output --  Net 1047.53 ml   Filed Weights   07/28/22 2157 07/31/22 0500 07/31/22 0617  Weight: 58.6 kg 60.6 kg 58.9 kg    Examination:  General exam: Appears calm and comfortable  Respiratory system: Clear to auscultation. Respiratory effort normal. Cardiovascular system: S1 & S2 heard, RRR.  Gastrointestinal system: Abdomen is soft Central nervous system: Alert and awake Extremities: No edema Skin: No significant lesions noted Psychiatry: Flat affect.    Data Reviewed: I have personally reviewed following labs and imaging studies  CBC: Recent Labs  Lab  07/28/22 1319 07/29/22 0545 07/30/22 0909 07/31/22 0610  WBC 18.0* 12.7* 7.7 5.0  HGB 14.1 11.9* 11.8* 11.6*  HCT 41.6 35.6* 35.9* 35.6*  MCV 90.6 93.2 94.5 94.2  PLT 225 176 198 227   Basic Metabolic Panel: Recent Labs  Lab 07/28/22 1319 07/29/22 0545 07/30/22 0909 07/31/22 0610  NA 138 135 141 139  K 3.5 3.0* 4.3 3.4*  CL 101 105 108 105  CO2 27 20* 23 24  GLUCOSE 114* 95 108* 94  BUN 16 12 6* 6*  CREATININE 0.76 0.76 0.69 0.67  CALCIUM 9.3 7.5* 8.0* 7.8*  MG  --   --   --  1.9   GFR: Estimated Creatinine Clearance: 37.6 mL/min (by C-G formula based on SCr of 0.67 mg/dL). Liver Function Tests: Recent Labs  Lab 07/28/22 1528 07/29/22 0545 07/30/22 0909 07/31/22 0610  AST 40 35 37 33  ALT 64* 55* 42 40  ALKPHOS 64 78 92 90  BILITOT 1.5* 1.2 1.4* 0.5  PROT 5.9* 5.7* 5.9* 5.8*  ALBUMIN 3.9 3.2* 3.1* 3.0*   No results for input(s): "LIPASE", "AMYLASE" in the last 168 hours. No results for input(s): "AMMONIA" in the last 168 hours. Coagulation Profile: No results for input(s): "INR", "PROTIME" in the last 168 hours. Cardiac Enzymes: Recent Labs  Lab 07/28/22 1528  CKTOTAL 60   BNP (last 3 results) No results for input(s): "PROBNP" in the last 8760 hours. HbA1C: No results for input(s): "HGBA1C" in the last 72 hours. CBG: Recent Labs  Lab 07/29/22 0827 07/30/22 0743 07/31/22 0740  GLUCAP 75 98 93   Lipid Profile: No results for input(s): "CHOL", "HDL", "LDLCALC", "TRIG", "CHOLHDL", "LDLDIRECT" in the last 72 hours. Thyroid Function Tests: No results for input(s): "TSH", "T4TOTAL", "FREET4", "T3FREE", "THYROIDAB" in the last 72 hours. Anemia Panel: No results for input(s): "VITAMINB12", "FOLATE", "FERRITIN", "TIBC", "IRON", "RETICCTPCT" in the last 72 hours. Sepsis Labs: Recent Labs  Lab 07/28/22 1528  LATICACIDVEN 1.1    Recent Results (from the past 240 hour(s))  Urine Culture     Status: Abnormal   Collection Time: 07/23/22 11:38 AM    Specimen: Urine  Result Value Ref Range Status   MICRO NUMBER: [id number]  Final   SPECIMEN QUALITY: Adequate  Final   Sample Source URINE  Final   STATUS: FINAL  Final   ISOLATE 1: Escherichia coli (A)  Final    Comment: Greater than 100,000 CFU/mL of Escherichia coli      Susceptibility   Escherichia coli - URINE CULTURE, REFLEX    AMOX/CLAVULANIC 4 Sensitive     AMPICILLIN 4 Sensitive     AMPICILLIN/SULBACTAM <=2 Sensitive     CEFAZOLIN* <=4 Not Reportable      * For infections other than uncomplicated UTI caused by E. coli, K. pneumoniae or P. mirabilis: Cefazolin is resistant if MIC > or = 8 mcg/mL. (Distinguishing susceptible versus intermediate for isolates with MIC <  or = 4 mcg/mL requires additional testing.) For uncomplicated UTI caused by E. coli, K. pneumoniae or P. mirabilis: Cefazolin is susceptible if MIC <32 mcg/mL and predicts susceptible to the oral agents cefaclor, cefdinir, cefpodoxime, cefprozil, cefuroxime, cephalexin and loracarbef.     CEFTAZIDIME <=1 Sensitive     CEFEPIME <=1 Sensitive     CEFTRIAXONE <=1 Sensitive     CIPROFLOXACIN >=4 Resistant     LEVOFLOXACIN >=8 Resistant     GENTAMICIN <=1 Sensitive     IMIPENEM <=0.25 Sensitive     NITROFURANTOIN <=16 Sensitive     PIP/TAZO <=4 Sensitive     TOBRAMYCIN <=1 Sensitive     TRIMETH/SULFA* <=20 Sensitive      * For infections other than uncomplicated UTI caused by E. coli, K. pneumoniae or P. mirabilis: Cefazolin is resistant if MIC > or = 8 mcg/mL. (Distinguishing susceptible versus intermediate for isolates with MIC < or = 4 mcg/mL requires additional testing.) For uncomplicated UTI caused by E. coli, K. pneumoniae or P. mirabilis: Cefazolin is susceptible if MIC <32 mcg/mL and predicts susceptible to the oral agents cefaclor, cefdinir, cefpodoxime, cefprozil, cefuroxime, cephalexin and loracarbef. Legend: S = Susceptible  I = Intermediate R = Resistant  NS = Not susceptible * = Not  tested  NR = Not reported **NN = See antimicrobic comments   SARS Coronavirus 2 by RT PCR (hospital order, performed in [facility] hospital lab) *cepheid single result test* Anterior Nasal Swab     Status: None   Collection Time: 07/28/22  2:21 PM   Specimen: Anterior Nasal Swab  Result Value Ref Range Status   SARS Coronavirus 2 by RT PCR NEGATIVE NEGATIVE Final    Comment: (NOTE) SARS-CoV-2 target nucleic acids are NOT DETECTED.  The SARS-CoV-2 RNA is generally detectable in upper and lower respiratory specimens during the acute phase of infection. The lowest concentration of SARS-CoV-2 viral copies this assay can detect is 250 copies / mL. A negative result does not preclude SARS-CoV-2 infection and should not be used as the sole basis for treatment or other patient management decisions.  A negative result may occur with improper specimen collection / handling, submission of specimen other than nasopharyngeal swab, presence of viral mutation(s) within the areas targeted by this assay, and inadequate number of viral copies (<250 copies / mL). A negative result must be combined with clinical observations, patient history, and epidemiological information.  Fact Sheet for Patients:   RoadLapTop.co.za  Fact Sheet for Healthcare Providers: http://kim-miller.com/  This test is not yet approved or  cleared by the [place] FDA and has been authorized for detection and/or diagnosis of SARS-CoV-2 by FDA under an Emergency Use Authorization (EUA).  This EUA will remain in effect (meaning this test can be used) for the duration of the COVID-19 declaration under Section 564(b)(1) of the Act, 21 U.S.C. section 360bbb-3(b)(1), unless the authorization is terminated or revoked sooner.  Performed at [facility], [address] [place], [place] [address]   Blood culture (routine x 2)     Status: None (Preliminary result)    Collection Time: 07/28/22  3:28 PM   Specimen: BLOOD LEFT ARM  Result Value Ref Range Status   Specimen Description   Final    BLOOD LEFT ARM Performed at [facility], [address], [place], [place] [address]    Special Requests   Final    BOTTLES DRAWN AEROBIC AND ANAEROBIC Blood Culture adequate volume Performed at [facility], [address] [place], [place] [address]    Culture   Final    NO GROWTH 3 DAYS Performed at [facility], [address], [place], [place] [address]    Report Status PENDING  Incomplete         Radiology Studies: No results found.      Scheduled Meds:  cycloSPORINE  1 drop Both Eyes BID   folic acid  0.5 mg Oral Daily   heparin  5,000 Units Subcutaneous Q8H   levothyroxine  50 mcg Oral QAC breakfast   montelukast  10 mg Oral QHS   multivitamin  1 tablet Oral BID   potassium chloride  40 mEq Oral BID   pregabalin  25 mg Oral QHS   Continuous Infusions:  cefTRIAXone (ROCEPHIN)  IV 2 g (07/30/22 1919)   metronidazole 500 mg (07/31/22 0546)     LOS: 3 days    Time spent: 35 minutes    Natelie Ostrosky D Sherryll Burger, DO [facility]  If 7PM-7AM, please contact night-coverage [email or web address] 07/31/2022, 9:40 AM

## 2022-07-31 NOTE — Progress Notes (Signed)
   Subjective/Chief Complaint: Having bowel function, wants food, no n/v, does not state she has ab pain   Objective: Vital signs in last 24 hours: Temp:  [97.6 F (36.4 C)-97.9 F (36.6 C)] 97.7 F (36.5 C) (06/08 0441) Pulse Rate:  [60-70] 64 (06/08 0441) Resp:  [15-18] 16 (06/08 0441) BP: (118-154)/(71-85) 154/71 (06/08 0441) SpO2:  [97 %-100 %] 97 % (06/08 0441) Weight:  [58.9 kg-60.6 kg] 58.9 kg (06/08 0617) Last BM Date : 07/30/22  Intake/Output from previous day: 06/07 0701 - 06/08 0700 In: 1047.5 [I.V.:647.5; IV Piggyback:400] Out: -  Intake/Output this shift: No intake/output data recorded.  Ab soft nontender   Lab Results:  Recent Labs    07/30/22 0909 07/31/22 0610  WBC 7.7 5.0  HGB 11.8* 11.6*  HCT 35.9* 35.6*  PLT 198 227   BMET Recent Labs    07/30/22 0909 07/31/22 0610  NA 141 139  K 4.3 3.4*  CL 108 105  CO2 23 24  GLUCOSE 108* 94  BUN 6* 6*  CREATININE 0.69 0.67  CALCIUM 8.0* 7.8*   PT/INR No results for input(s): "LABPROT", "INR" in the last 72 hours. ABG No results for input(s): "PHART", "HCO3" in the last 72 hours.  Invalid input(s): "PCO2", "PO2"  Studies/Results: No results found.  Anti-infectives: Anti-infectives (From admission, onward)    Start     Dose/Rate Route Frequency Ordered Stop   07/29/22 1900  cefTRIAXone (ROCEPHIN) 2 g in sodium chloride 0.9 % 100 mL IVPB        2 g 200 mL/hr over 30 Minutes Intravenous Every 24 hours 07/29/22 0517     07/29/22 0700  metroNIDAZOLE (FLAGYL) IVPB 500 mg        500 mg 100 mL/hr over 60 Minutes Intravenous Every 12 hours 07/29/22 0517     07/28/22 1830  cefTRIAXone (ROCEPHIN) 2 g in sodium chloride 0.9 % 100 mL IVPB        2 g 200 mL/hr over 30 Minutes Intravenous  Once 07/28/22 1821 07/28/22 1932   07/28/22 1830  metroNIDAZOLE (FLAGYL) IVPB 500 mg        500 mg 100 mL/hr over 60 Minutes Intravenous  Once 07/28/22 1821 07/28/22 2000       Assessment/Plan: Small bowel  diverticulitis with 2.4cm abscess  -cont abx -fulls, adat -no urgent needs for surgery  -she understands with repeated episode surgery best treatment but at 89 she would like to see if this will get better conservatively again and I dont disagree  FEN: fulls, adat ID: [email or web address] VTE: heparin subq  I reviewed hospitalist notes, last 24 h vitals and pain scores, last 48 h intake and output, last 24 h labs and trends, and last 24 h imaging results.   Margaret [name] 07/31/2022

## 2022-08-01 DIAGNOSIS — K5792 Diverticulitis of intestine, part unspecified, without perforation or abscess without bleeding: Secondary | ICD-10-CM | POA: Diagnosis not present

## 2022-08-01 LAB — CBC
HCT: 36.3 % (ref 36.0–46.0)
Hemoglobin: 11.7 g/dL — ABNORMAL LOW (ref 12.0–15.0)
MCH: 30.2 pg (ref 26.0–34.0)
MCHC: 32.2 g/dL (ref 30.0–36.0)
MCV: 93.6 fL (ref 80.0–100.0)
Platelets: 233 10*3/uL (ref 150–400)
RBC: 3.88 MIL/uL (ref 3.87–5.11)
RDW: 12 % (ref 11.5–15.5)
WBC: 5.1 10*3/uL (ref 4.0–10.5)
nRBC: 0 % (ref 0.0–0.2)

## 2022-08-01 LAB — BASIC METABOLIC PANEL
Anion gap: 9 (ref 5–15)
BUN: 6 mg/dL — ABNORMAL LOW (ref 8–23)
CO2: 23 mmol/L (ref 22–32)
Calcium: 8.6 mg/dL — ABNORMAL LOW (ref 8.9–10.3)
Chloride: 108 mmol/L (ref 98–111)
Creatinine, Ser: 0.51 mg/dL (ref 0.44–1.00)
GFR, Estimated: >60 mL/min (ref >60)
Glucose, Bld: 94 mg/dL (ref 70–99)
Potassium: 4 mmol/L (ref 3.5–5.1)
Sodium: 140 mmol/L (ref 135–145)

## 2022-08-01 LAB — GLUCOSE, CAPILLARY: Glucose-Capillary: 91 mg/dL (ref 70–99)

## 2022-08-01 LAB — CULTURE, BLOOD (ROUTINE X 2)

## 2022-08-01 LAB — MAGNESIUM: Magnesium: 2 mg/dL (ref 1.7–2.4)

## 2022-08-01 MED ORDER — ACETAMINOPHEN 325 MG PO TABS
650.0000 mg | ORAL_TABLET | Freq: Four times a day (QID) | ORAL | Status: DC | PRN
  Administered 2022-08-01: 650 mg via ORAL
  Filled 2022-08-01: qty 2

## 2022-08-01 NOTE — Progress Notes (Signed)
Mobility Specialist - Progress Note   08/01/22 1424  Mobility  Activity Ambulated with assistance in hallway  Level of Assistance Standby assist, set-up cues, supervision of patient - no hands on  Assistive Device Front wheel walker  Distance Ambulated (ft) 250 ft  Range of Motion/Exercises Active  Activity Response Tolerated well  Mobility Referral Yes  $Mobility charge 1 Mobility  Mobility Specialist Start Time (ACUTE ONLY) 1415  Mobility Specialist Stop Time (ACUTE ONLY) 1425  Mobility Specialist Time Calculation (min) (ACUTE ONLY) 10 min   Pt received in bed and agreed to mobility. Had no issues throughout session, returned to bed with all needs met and family in room.  Marilynne Halsted Mobility Specialist

## 2022-08-01 NOTE — Progress Notes (Signed)
   Subjective/Chief Complaint: Large bm, no real ab pain, no n/v, tol fulls   Objective: Vital signs in last 24 hours: Temp:  [97.9 F (36.6 C)-98.2 F (36.8 C)] 98.2 F (36.8 C) (06/09 0518) Pulse Rate:  [63-71] 63 (06/09 0518) Resp:  [18] 18 (06/08 1222) BP: (148-170)/(76-95) 168/82 (06/09 0518) SpO2:  [96 %-100 %] 96 % (06/09 0518) Weight:  [58.9 kg] 58.9 kg (06/09 0452) Last BM Date : 07/31/22  Intake/Output from previous day: 06/08 0701 - 06/09 0700 In: 780 [P.O.:480; IV Piggyback:300] Out: -  Intake/Output this shift: No intake/output data recorded.  Ab soft nontender nondistended  Lab Results:  Recent Labs    07/31/22 0610 08/01/22 0541  WBC 5.0 5.1  HGB 11.6* 11.7*  HCT 35.6* 36.3  PLT 227 233   BMET Recent Labs    07/31/22 0610 08/01/22 0541  NA 139 140  K 3.4* 4.0  CL 105 108  CO2 24 23  GLUCOSE 94 94  BUN 6* 6*  CREATININE 0.67 0.51  CALCIUM 7.8* 8.6*   PT/INR No results for input(s): "LABPROT", "INR" in the last 72 hours. ABG No results for input(s): "PHART", "HCO3" in the last 72 hours.  Invalid input(s): "PCO2", "PO2"  Studies/Results: No results found.  Anti-infectives: Anti-infectives (From admission, onward)    Start     Dose/Rate Route Frequency Ordered Stop   07/29/22 1900  cefTRIAXone (ROCEPHIN) 2 g in sodium chloride 0.9 % 100 mL IVPB        2 g 200 mL/hr over 30 Minutes Intravenous Every 24 hours 07/29/22 0517     07/29/22 0700  metroNIDAZOLE (FLAGYL) IVPB 500 mg        500 mg 100 mL/hr over 60 Minutes Intravenous Every 12 hours 07/29/22 0517     07/28/22 1830  cefTRIAXone (ROCEPHIN) 2 g in sodium chloride 0.9 % 100 mL IVPB        2 g 200 mL/hr over 30 Minutes Intravenous  Once 07/28/22 1821 07/28/22 1932   07/28/22 1830  metroNIDAZOLE (FLAGYL) IVPB 500 mg        500 mg 100 mL/hr over 60 Minutes Intravenous  Once 07/28/22 1821 07/28/22 2000       Assessment/Plan: Small bowel diverticulitis with 2.4cm abscess   -cont abx -soft diet -no urgent needs for surgery  -she understands with repeated episode surgery best treatment but at 89 she would like to see if this will get better conservatively again and I dont disagree -if does not continue to improve will need repeat imaging    FEN: soft, adat ID: rocephin/flagyl VTE: heparin subq  I reviewed hospitalist notes, last 24 h vitals and pain scores, last 48 h intake and output, last 24 h labs and trends, and last 24 h imaging results.   Margaret [name] 08/01/2022

## 2022-08-01 NOTE — Progress Notes (Signed)
PROGRESS NOTE   Margaret [name]  NWG:[id number] DOB: 1929-06-04 DOA: 07/28/2022 PCP: [name], DO  Brief Narrative:   89 year old white female-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 Known inflammatory arthritis on prednisone--, DJD cervical spine PVD prior Plaquenil/methotrexate , hypothyroid, asthma, wet macular degeneration-still able to function Prior 3.9 cm appendiceal fluid collection versus distal appendiceal perforation 02/2020-conservatively managed with antibiotics-per patient underwent several CTs in the outpatient setting and follow-up with Dr. Concha Se and this seemed to stabilize  Apparently recently treated for UTI 5/31-symptoms returned-prior culture May/17 mixed flora-resumed Cipro Seen in office lethargic low energy dysnomia unintelligible and not sounding herself-difficult to arouse when usually arises early  Found to have diverticulitis with acute 2.4 cm abscess WBC 18-General surgery consulted recommended IV antibiotics Family expressed preference for conservative measures as did patient  Hospital-Problem based course   2.4 cm diverticular abscess send diverticulitis recurrent Has had a thoughtful discussion with general surgery Dr. Dwain Sarna and still prefers to conservatively manage on ceftriaxone and Flagyl-expect will need total 14 to 21 days total antibiotics CT scan as per general surgery WBC 5.1-  Inflammatory arthritis, DJD spine-rheumatoid arthritis followed by Dr. Melburn Popper Prednisone on hold--likely underlying risk factor given immunosuppression Will CC rheumatologist Continue Lyrica 25 at bedtime  Hypothyroid continue Synthroid 50 mcg daily  Macular degeneration Continue Restasis ETEC polyphenol chloride  DVT prophylaxis: Heparin Code Status: Full Family Communication: Discussed with daughter Raynelle Fanning at the bedside Disposition:  Status is: Inpatient Remains inpatient appropriate because: Requires resolution  and further discussion with general surgery     Subjective: Awake coherent no distress looks relatively comfortable-has a headache but was able to eat some of the soft diet  Objective: Vitals:   07/31/22 2109 08/01/22 0452 08/01/22 0518 08/01/22 1315  BP: (!) 148/76  (!) 168/82 (!) 153/93  Pulse: 63  63 72  Resp:    18  Temp: 98 F (36.7 C)  98.2 F (36.8 C) 97.9 F (36.6 C)  TempSrc: Oral  Oral Oral  SpO2: 98%  96% 99%  Weight:  58.9 kg    Height:        Intake/Output Summary (Last 24 hours) at 08/01/2022 1747 Last data filed at 08/01/2022 0300 Gross per 24 hour  Intake 200 ml  Output --  Net 200 ml   Filed Weights   07/31/22 0500 07/31/22 0617 08/01/22 0452  Weight: 60.6 kg 58.9 kg 58.9 kg    Examination:  EOMI NCAT looks younger than stated age coherent slow speech No icterus no pallor Neck soft supple CTAB Abdomen soft no rebound-on deep palpation right lower quadrant has some tenderness Neurologically intact moving 4 limbs equally no deficit  Data Reviewed: personally reviewed   CBC    Component Value Date/Time   WBC 5.1 08/01/2022 0541   RBC 3.88 08/01/2022 0541   HGB 11.7 (L) 08/01/2022 0541   HCT 36.3 08/01/2022 0541   PLT 233 08/01/2022 0541   MCV 93.6 08/01/2022 0541   MCH 30.2 08/01/2022 0541   MCHC 32.2 08/01/2022 0541   RDW 12.0 08/01/2022 0541   LYMPHSABS 757 (L) 09/05/2020 1501   MONOABS 0.8 03/19/2020 1159   EOSABS 156 09/05/2020 1501   BASOSABS 39 09/05/2020 1501      Latest Ref Rng & Units 08/01/2022    5:41 AM 07/31/2022    6:10 AM 07/30/2022    9:09 AM  CMP  Glucose 70 - 99 mg/dL 94  94  578   BUN 8 -  23 mg/dL 6  6  6    Creatinine 0.44 - 1.00 mg/dL 4.54  0.98  1.19   Sodium 135 - 145 mmol/L 140  139  141   Potassium 3.5 - 5.1 mmol/L 4.0  3.4  4.3   Chloride 98 - 111 mmol/L 108  105  108   CO2 22 - 32 mmol/L 23  24  23    Calcium 8.9 - 10.3 mg/dL 8.6  7.8  8.0   Total Protein 6.5 - 8.1 g/dL  5.8  5.9   Total Bilirubin 0.3 - 1.2  mg/dL  0.5  1.4   Alkaline Phos 38 - 126 U/L  90  92   AST 15 - 41 U/L  33  37   ALT 0 - 44 U/L  40  42      Radiology Studies: No results found.   Scheduled Meds:  cycloSPORINE  1 drop Both Eyes BID   folic acid  0.5 mg Oral Daily   heparin  5,000 Units Subcutaneous Q8H   levothyroxine  50 mcg Oral QAC breakfast   montelukast  10 mg Oral QHS   multivitamin  1 tablet Oral BID   pregabalin  25 mg Oral QHS   Continuous Infusions:  cefTRIAXone (ROCEPHIN)  IV 2 g (07/31/22 2013)   metronidazole 500 mg (08/01/22 0526)     LOS: 4 days   Time spent: 78  Rhetta Mura, MD [facility] To contact the attending provider between 7A-7P or the covering provider during after hours 7P-7A, please log into the web site [email or web address] and access using universal [place] password for that web site. If you do not have the password, please call the hospital operator.  08/01/2022, 5:47 PM

## 2022-08-02 ENCOUNTER — Inpatient Hospital Stay (HOSPITAL_COMMUNITY): Payer: Medicare HMO

## 2022-08-02 DIAGNOSIS — K5792 Diverticulitis of intestine, part unspecified, without perforation or abscess without bleeding: Secondary | ICD-10-CM | POA: Diagnosis not present

## 2022-08-02 LAB — GLUCOSE, CAPILLARY: Glucose-Capillary: 93 mg/dL (ref 70–99)

## 2022-08-02 LAB — BASIC METABOLIC PANEL
Anion gap: 9 (ref 5–15)
BUN: 8 mg/dL (ref 8–23)
CO2: 26 mmol/L (ref 22–32)
Calcium: 9 mg/dL (ref 8.9–10.3)
Chloride: 105 mmol/L (ref 98–111)
Creatinine, Ser: 0.81 mg/dL (ref 0.44–1.00)
GFR, Estimated: >60 mL/min (ref >60)
Glucose, Bld: 98 mg/dL (ref 70–99)
Potassium: 3.9 mmol/L (ref 3.5–5.1)
Sodium: 140 mmol/L (ref 135–145)

## 2022-08-02 LAB — CULTURE, BLOOD (ROUTINE X 2)
Culture: NO GROWTH
Special Requests: ADEQUATE

## 2022-08-02 MED ORDER — SODIUM CHLORIDE (PF) 0.9 % IJ SOLN
INTRAMUSCULAR | Status: AC
  Filled 2022-08-02: qty 50

## 2022-08-02 MED ORDER — HYDRALAZINE HCL 20 MG/ML IJ SOLN: 2.0000 mg | INTRAMUSCULAR | Status: DC | PRN

## 2022-08-02 MED ORDER — IOHEXOL 9 MG/ML PO SOLN
500.0000 mL | ORAL | Status: AC
  Administered 2022-08-02 (×2): 500 mL via ORAL

## 2022-08-02 MED ORDER — IOHEXOL 9 MG/ML PO SOLN
ORAL | Status: AC
  Filled 2022-08-02: qty 1000

## 2022-08-02 MED ORDER — IOHEXOL 300 MG/ML  SOLN
100.0000 mL | Freq: Once | INTRAMUSCULAR | Status: AC | PRN
  Administered 2022-08-02: 100 mL via INTRAVENOUS

## 2022-08-02 NOTE — Plan of Care (Signed)

## 2022-08-02 NOTE — Care Management Important Message (Signed)
Important Message  Patient Details IM Letter given. Name: Margaret [name] MRN: [id number] Date of Birth: Apr 21, 1929   Medicare Important Message Given:  Yes     Caren Macadam 08/02/2022, 10:02 AM

## 2022-08-02 NOTE — Progress Notes (Signed)
Mobility Specialist - Progress Note   08/02/22 1002  Mobility  Activity Ambulated with assistance in hallway  Level of Assistance Standby assist, set-up cues, supervision of patient - no hands on  Assistive Device Front wheel walker  Distance Ambulated (ft) 200 ft  Range of Motion/Exercises Active  Activity Response Tolerated well  Mobility Referral Yes  $Mobility charge 1 Mobility  Mobility Specialist Start Time (ACUTE ONLY) 0950  Mobility Specialist Stop Time (ACUTE ONLY) 1002  Mobility Specialist Time Calculation (min) (ACUTE ONLY) 12 min   Pt received in bed and agreed to mobility. Had no issues throughout session. Returned to chair with all needs met.  Marilynne Halsted Mobility Specialist

## 2022-08-02 NOTE — Progress Notes (Signed)
   Subjective/Chief Complaint: Moving her bowels.  Had one episode of emesis yesterday, but has tolerated her soft diet since then and feels well.   Objective: Vital signs in last 24 hours: Temp:  [97.8 F (36.6 C)-98.1 F (36.7 C)] 97.8 F (36.6 C) (06/10 0605) Pulse Rate:  [60-72] 60 (06/10 0605) Resp:  [17-18] 17 (06/10 0605) BP: (145-153)/(69-93) 153/69 (06/10 0605) SpO2:  [97 %-99 %] 98 % (06/10 0605) Weight:  [56.2 kg] 56.2 kg (06/10 0605) Last BM Date : 07/31/22  Intake/Output from previous day: No intake/output data recorded. Intake/Output this shift: No intake/output data recorded.  Ab soft nontender nondistended  Lab Results:  Recent Labs    07/31/22 0610 08/01/22 0541  WBC 5.0 5.1  HGB 11.6* 11.7*  HCT 35.6* 36.3  PLT 227 233   BMET Recent Labs    08/01/22 0541 08/02/22 0534  NA 140 140  K 4.0 3.9  CL 108 105  CO2 23 26  GLUCOSE 94 98  BUN 6* 8  CREATININE 0.51 0.81  CALCIUM 8.6* 9.0   PT/INR No results for input(s): "LABPROT", "INR" in the last 72 hours. ABG No results for input(s): "PHART", "HCO3" in the last 72 hours.  Invalid input(s): "PCO2", "PO2"  Studies/Results: No results found.  Anti-infectives: Anti-infectives (From admission, onward)    Start     Dose/Rate Route Frequency Ordered Stop   07/29/22 1900  cefTRIAXone (ROCEPHIN) 2 g in sodium chloride 0.9 % 100 mL IVPB        2 g 200 mL/hr over 30 Minutes Intravenous Every 24 hours 07/29/22 0517     07/29/22 0700  metroNIDAZOLE (FLAGYL) IVPB 500 mg        500 mg 100 mL/hr over 60 Minutes Intravenous Every 12 hours 07/29/22 0517     07/28/22 1830  cefTRIAXone (ROCEPHIN) 2 g in sodium chloride 0.9 % 100 mL IVPB        2 g 200 mL/hr over 30 Minutes Intravenous  Once 07/28/22 1821 07/28/22 1932   07/28/22 1830  metroNIDAZOLE (FLAGYL) IVPB 500 mg        500 mg 100 mL/hr over 60 Minutes Intravenous  Once 07/28/22 1821 07/28/22 2000       Assessment/Plan: Small bowel  diverticulitis with 2.4cm abscess -cont abx -soft diet -no urgent needs for surgery  -she understands with repeated episode surgery best treatment but at 89 she would like to see if this will get better conservatively again and I dont disagree -repeat CT scan today to assure that fluid collection and inflammation are improving prior to discharge.  If this looks stable to improved, can plan for DC home later today/tomorrow pending timing of CT Scan.   FEN: soft ID: rocephin/flagyl VTE: heparin subq  I reviewed hospitalist notes, last 24 h vitals and pain scores, last 48 h intake and output, last 24 h labs and trends, and last 24 h imaging results.   Letha Cape 08/02/2022

## 2022-08-02 NOTE — Progress Notes (Addendum)
PROGRESS NOTE   Margaret [name]  WUJ:[id number] DOB: 11/28/1929 DOA: 07/28/2022 PCP: [name], DO  Brief Narrative:   89 year old white female-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 Known inflammatory arthritis on prednisone--, DJD cervical spine PVD prior Plaquenil/methotrexate , hypothyroid, asthma, wet macular degeneration-still able to function Prior 3.9 cm appendiceal fluid collection versus distal appendiceal perforation 02/2020-conservatively managed with antibiotics-per patient underwent several CTs in the outpatient setting and follow-up with Dr. Concha Se and this seemed to stabilize  Apparently recently treated for UTI 5/31-symptoms returned-prior culture May/17 mixed flora-resumed Cipro Seen in office lethargic low energy dysnomia unintelligible and not sounding herself-difficult to arouse when usually arises early  Found to have diverticulitis with acute 2.4 cm abscess WBC 18-General surgery consulted recommended IV antibiotics Family expressed preference for conservative measures as did patient  Hospital-Problem based course   2.4 cm diverticular abscess +diverticulitis --recurrent, had a case of this in 2022 Some mild abdominal discomfort but CT scan final read pending CT scan as per general surgery WBC 5.1 ACC calculator [in case goes to surgery]=18 % serious complication, 21 % any complication  Inflammatory arthritis, DJD spine-rheumatoid arthritis followed by Dr. Lannie Fields, Kathi Ludwig Prednisone on hold--likely underlying risk factor given immunosuppression Will [facility] rheumatologist At her age  suggest not resuming prednisone-it is rare that people in their 90s have active rheumatoid processes and this is something that should be considered Continue Lyrica 25 at bedtime  Hypothyroid continue Synthroid 50 mcg daily  Macular degeneration Continue Restasis ETEC polyphenol chloride  DVT prophylaxis: Heparin Code Status: Full Family  Communication: Discussed with patient alone on 6/10 Disposition:  Status is: Inpatient Remains inpatient appropriate because: Requires resolution and further discussion with general surgery    Subjective:  Doing fair no distress blood pressure elevated in the 160s, and I think that this is reactive-she does not have a prior to admission diagnosis of elevated blood pressure Admits that she is a little bit worried-tells me that she is reconsidering having surgery --we had a chat about what surgery might entail--I have told her that the main risk factors for her are her advanced age and potentially her rheumatoid but otherwise she is a pretty healthy lady  Objective: Vitals:   08/01/22 1315 08/01/22 2015 08/02/22 0605 08/02/22 1231  BP: (!) 153/93 (!) 145/82 (!) 153/69 (!) 170/78  Pulse: 72 64 60 61  Resp: 18 17 17 15   Temp: 97.9 F (36.6 C) 98.1 F (36.7 C) 97.8 F (36.6 C) 97.9 F (36.6 C)  TempSrc: Oral Oral Oral Oral  SpO2: 99% 97% 98% 98%  Weight:   56.2 kg   Height:        Intake/Output Summary (Last 24 hours) at 08/02/2022 1638 Last data filed at 08/02/2022 0943 Gross per 24 hour  Intake 237 ml  Output --  Net 237 ml    Filed Weights   07/31/22 0617 08/01/22 0452 08/02/22 0605  Weight: 58.9 kg 58.9 kg 56.2 kg    Examination:  EOMI NCAT looks younger than stated age coherent slow speech No icterus no pallor Neck soft supple CTAB Abdomen soft no rebound-on deep palpation right lower quadrant has some tenderness Neurologically intact moving 4 limbs equally no deficit  Data Reviewed: personally reviewed   CBC    Component Value Date/Time   WBC 5.1 08/01/2022 0541   RBC 3.88 08/01/2022 0541   HGB 11.7 (L) 08/01/2022 0541   HCT 36.3 08/01/2022 0541   PLT 233 08/01/2022 0541   MCV  93.6 08/01/2022 0541   MCH 30.2 08/01/2022 0541   MCHC 32.2 08/01/2022 0541   RDW 12.0 08/01/2022 0541   LYMPHSABS 757 (L) 09/05/2020 1501   MONOABS 0.8 03/19/2020 1159   EOSABS  156 09/05/2020 1501   BASOSABS 39 09/05/2020 1501      Latest Ref Rng & Units 08/02/2022    5:34 AM 08/01/2022    5:41 AM 07/31/2022    6:10 AM  CMP  Glucose 70 - 99 mg/dL 98  94  94   BUN 8 - 23 mg/dL 8  6  6    Creatinine 0.44 - 1.00 mg/dL 9.14  7.82  9.56   Sodium 135 - 145 mmol/L 140  140  139   Potassium 3.5 - 5.1 mmol/L 3.9  4.0  3.4   Chloride 98 - 111 mmol/L 105  108  105   CO2 22 - 32 mmol/L 26  23  24    Calcium 8.9 - 10.3 mg/dL 9.0  8.6  7.8   Total Protein 6.5 - 8.1 g/dL   5.8   Total Bilirubin 0.3 - 1.2 mg/dL   0.5   Alkaline Phos 38 - 126 U/L   90   AST 15 - 41 U/L   33   ALT 0 - 44 U/L   40      Radiology Studies: No results found.   Scheduled Meds:  cycloSPORINE  1 drop Both Eyes BID   folic acid  0.5 mg Oral Daily   heparin  5,000 Units Subcutaneous Q8H   levothyroxine  50 mcg Oral QAC breakfast   montelukast  10 mg Oral QHS   multivitamin  1 tablet Oral BID   pregabalin  25 mg Oral QHS   Continuous Infusions:  cefTRIAXone (ROCEPHIN)  IV 2 g (08/01/22 1816)   metronidazole Stopped (08/02/22 0940)     LOS: 5 days   Time spent: 60  Rhetta Mura, MD [facility] To contact the attending provider between 7A-7P or the covering provider during after hours 7P-7A, please log into the web site [email or web address] and access using universal [facility] password for that web site. If you do not have the password, please call the hospital operator.  08/02/2022, 4:38 PM

## 2022-08-03 DIAGNOSIS — K5792 Diverticulitis of intestine, part unspecified, without perforation or abscess without bleeding: Secondary | ICD-10-CM | POA: Diagnosis not present

## 2022-08-03 LAB — GLUCOSE, CAPILLARY: Glucose-Capillary: 132 mg/dL — ABNORMAL HIGH (ref 70–99)

## 2022-08-03 LAB — BASIC METABOLIC PANEL
Anion gap: 8 (ref 5–15)
BUN: 11 mg/dL (ref 8–23)
CO2: 26 mmol/L (ref 22–32)
Calcium: 8.8 mg/dL — ABNORMAL LOW (ref 8.9–10.3)
Chloride: 106 mmol/L (ref 98–111)
Creatinine, Ser: 0.85 mg/dL (ref 0.44–1.00)
GFR, Estimated: >60 mL/min (ref >60)
Glucose, Bld: 97 mg/dL (ref 70–99)
Potassium: 3.7 mmol/L (ref 3.5–5.1)
Sodium: 140 mmol/L (ref 135–145)

## 2022-08-03 MED ORDER — METRONIDAZOLE 500 MG PO TABS: 500.0000 mg | ORAL_TABLET | Freq: Two times a day (BID) | ORAL | 0 refills | Status: AC

## 2022-08-03 MED ORDER — ACETAMINOPHEN 325 MG PO TABS: 650.0000 mg | ORAL_TABLET | Freq: Four times a day (QID) | ORAL | Status: DC | PRN

## 2022-08-03 MED ORDER — AMOXICILLIN-POT CLAVULANATE 500-125 MG PO TABS: 1.0000 | ORAL_TABLET | Freq: Three times a day (TID) | ORAL | 0 refills | Status: DC

## 2022-08-03 MED ORDER — ONDANSETRON HCL 4 MG PO TABS: 4.0000 mg | ORAL_TABLET | Freq: Four times a day (QID) | ORAL | 0 refills | Status: DC | PRN

## 2022-08-03 MED ORDER — CIPROFLOXACIN HCL 500 MG PO TABS: 500.0000 mg | ORAL_TABLET | Freq: Two times a day (BID) | ORAL | 0 refills | Status: AC

## 2022-08-03 NOTE — Discharge Summary (Addendum)
Physician Discharge Summary  Margaret [name] GEX:[id number] DOB: Jun 14, 1929 DOA: 07/28/2022  PCP: Margaret Leatherwood, DO  Admit date: 07/28/2022 Discharge date: 08/03/2022  Time spent: 37 minutes  Recommendations for Outpatient Follow-up:  Needs CBC 1 week--please get TSH in about 3 weeks Recommend close follow-up with Dr. Gerrit [name] general surgery in about 3 weeks Rx given for 28 days of cipor/flagyll on discharge to ensure that patient has a long course of therapy for the abscess that is resolving No more steroids-with immunosuppression do not think this is a good idea with her recurrent infections-would recommend outpatient confirmatory labs once patient has been treated for about 2 to 3 weeks either PCP/rheumatology office with regards to getting a CRP, ESR and other rheumatoid specific labs to determine disease activity  Discharge Diagnoses:  MAIN problem for hospitalization   Diverticular abscess with diverticulitis 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 Hypothyroidism Macular degeneration  Please see below for itemized issues addressed in HOpsital- refer to other progress notes for clarity if needed  Discharge Condition: Improved  Diet recommendation: Regular  Filed Weights   08/01/22 0452 08/02/22 0605 08/03/22 0500  Weight: 58.9 kg 56.2 kg 59.9 kg    History of present illness:  89 year old white female-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 Known inflammatory arthritis on prednisone--, DJD cervical spine PVD prior Plaquenil/methotrexate , hypothyroid, asthma, wet macular degeneration-still able to function Prior 3.9 cm appendiceal fluid collection versus distal appendiceal perforation 02/2020-conservatively managed with antibiotics-per patient underwent several CTs in the outpatient setting and follow-up with Margaret [name] and this seemed to stabilize   Apparently recently treated for UTI 5/31-symptoms returned-prior  culture May/17 mixed flora-resumed Cipro Seen in office lethargic low energy dysnomia unintelligible and not sounding herself-difficult to arouse when usually arises early   Found to have diverticulitis with acute 2.4 cm abscess WBC 18-General surgery consulted recommended IV antibiotics Family expressed preference for conservative measures as did patient  Hospital Course:   2.4 cm diverticular abscess +diverticulitis --recurrent, had a case of this in 2022 Some mild abdominal discomfort but CT scan final showed interval improvement of stranding and adjacent circumferential bowel wall thickening and interval likely small increase in size of mesenteric abscess and deep pelvis within small bowel mesentery  severe sigmoid diverticulosis General surgery felt that this could be observed and monitored closely in the outpatient setting and will set up a 3-week follow-up appointment with her and I have placed the patient on cipro/flagyll for 28 more days ACC calculator [in case goes to surgery]=18 % serious complication, 21 % any complication and this will be further discussed with the patient on close follow-up with Dr. Gerrit [name] appreciate input   Inflammatory arthritis, DJD spine-rheumatoid arthritis followed by Margaret [name], Margaret [name] Prednisone on hold--likely underlying risk factor given immunosuppression Will CC rheumatologist. At her age suggest not resuming prednisone-it is rare that people in their 90s have active rheumatoid processes and this is something that should be considered Continue Lyrica 25 at bedtime   Hypothyroid continue Synthroid 50 mcg daily-needs a TSH in about 4 to 6 weeks once more stable   Macular degeneration Continue Restasis ETEC polyphenol chloride   Discharge Exam: Vitals:   08/02/22 2001 08/03/22 0416  BP: 118/61 136/75  Pulse: 63 65  Resp: 18 18  Temp: 98.5 F (36.9 C) 97.6 F (36.4 C)  SpO2: 98% 96%    Subj on day of d/c   Awake coherent no distress No  icterus no pallor Chest clear  no wheezing Abdomen soft no rebound no guarding No lower extremity edema Neuro intact   Discharge Instructions   Discharge Instructions     Diet - low sodium heart healthy   Complete by: As directed    Discharge instructions   Complete by: As directed    Make sure you look at the antibiotics carefully and take them as you have been prescribed as you do require them long-term and will need a close follow-up with Margaret [name] who we have CCed on this note-please call his office and ensure that you get an appointment within 4 weeks from time of discharge Please do not take your prednisone this medication can cause you to have worsening infections-this will need to be discussed with your rheumatologist in the outpatient setting Please ensure that you follow-up with your primary care physician in about 1 week and get screening labs  For high-grade fever, nausea, vomiting, chest pain please ensure that you call your primary care physician For the large amounts of bright red blood per rectum or tarry black stool please come to the emergency room immediately   Increase activity slowly   Complete by: As directed       Allergies as of 08/03/2022       Reactions   Augmentin [amoxicillin-pot Clavulanate] Swelling, Rash   Swelling in face   Lactose Intolerance (gi)         Medication List     STOP taking these medications    folic acid 0.5 MG tablet Commonly known as: FOLVITE   lactase 3000 units tablet Commonly known as: LACTAID   predniSONE 5 MG tablet Commonly known as: DELTASONE       TAKE these medications    acetaminophen 325 MG tablet Commonly known as: TYLENOL Take 2 tablets (650 mg total) by mouth every 6 (six) hours as needed for moderate pain or headache.   albuterol 108 (90 Base) MCG/ACT inhaler Commonly known as: VENTOLIN HFA Inhale 2 puffs into the lungs every 6 (six) hours as needed for wheezing or shortness of breath.    amoxicillin-clavulanate 500-125 MG tablet Commonly known as: Augmentin Take 1 tablet by mouth 3 (three) times daily for 28 days.   cycloSPORINE 0.05 % ophthalmic emulsion Commonly known as: RESTASIS Place 1 drop into both eyes 2 (two) times daily.   levothyroxine 50 MCG tablet Commonly known as: SYNTHROID Take 1 tablet (50 mcg total) by mouth daily before breakfast.   montelukast 10 MG tablet Commonly known as: SINGULAIR Take 1 tablet (10 mg total) by mouth at bedtime.   ondansetron 4 MG tablet Commonly known as: ZOFRAN Take 1 tablet (4 mg total) by mouth every 6 (six) hours as needed for nausea.   polyvinyl alcohol 1.4 % ophthalmic solution Commonly known as: LIQUIFILM TEARS Place 1 drop into both eyes as needed for dry eyes.   pregabalin 25 MG capsule Commonly known as: Lyrica Take 1 capsule (25 mg total) by mouth at bedtime.   PreserVision AREDS 2 Caps Take 1 capsule by mouth 2 (two) times daily.       Allergies  Allergen Reactions   Augmentin [Amoxicillin-Pot Clavulanate] Swelling and Rash    Swelling in face   Lactose Intolerance (Gi)     Follow-up Information     Darnell Level, MD Follow up in 3 week(s).   Specialty: General Surgery Why: Arrive 30 minutes prior to your appointment time, Please bring your insurance card and photo ID Contact information: [address]  [address] [place] [address] [phone]                  The results of significant diagnostics from this hospitalization (including imaging, microbiology, ancillary and laboratory) are listed below for reference.    Significant Diagnostic Studies: CT ABDOMEN PELVIS W CONTRAST  Result Date: 08/02/2022 CLINICAL DATA:  Small-bowel diverticulitis, follow-up examination EXAM: CT ABDOMEN AND PELVIS WITH CONTRAST TECHNIQUE: Multidetector CT imaging of the abdomen and pelvis was performed using the standard protocol following bolus administration of intravenous contrast.  RADIATION DOSE REDUCTION: This exam was performed according to the departmental dose-optimization program which includes automated exposure control, adjustment of the mA and/or kV according to patient size and/or use of iterative reconstruction technique. CONTRAST:  OMNIPAQUE IOHEXOL 300 MG/ML  SOLN COMPARISON:  07/28/2022 FINDINGS: Lower chest: No acute abnormality. Hepatobiliary: Mild hepatic steatosis. No enhancing intrahepatic mass. No intra or extrahepatic biliary ductal dilation. Gallbladder unremarkable. Pancreas: Unremarkable Spleen: Unremarkable Adrenals/Urinary Tract: Adrenal glands are unremarkable. Kidneys are normal, without renal calculi, focal lesion, or hydronephrosis. Bladder is unremarkable. Stomach/Bowel: Previously noted inflammatory stranding and adjacent circumferential bowel wall thickening within the deep pelvis has improved in the interval since prior examination. A loculated gas and debris collection compatible with a a mesenteric abscess is again seen within the deep pelvis within the small bowel mesentery of the mid jejunum and appears slightly larger in size measuring 2.0 x 3.0 cm in dimension though this is related to a enlarging gaseous component. There is severe sigmoid diverticulosis again identified. There are scattered small bowel diverticula also identified, particularly involving the loop of small bowel adjacent to the mesenteric abscess. No free intraperitoneal gas or fluid. No evidence of obstruction. The stomach, small bowel, and large bowel are otherwise unremarkable the appendix is not clearly identified, however, no secondary signs of appendicitis are seen within the right lower quadrant. Vascular/Lymphatic: Aortic atherosclerosis. The left gonadal vein is dilated and there are numerous left adnexal varices identified suggesting changes of ovarian vein reflux and pelvic venous insufficiency. No enlarged abdominal or pelvic lymph nodes. Reproductive: Uterus and  bilateral adnexa are unremarkable. Other: No abdominal wall hernia Musculoskeletal: Osseous structures are age-appropriate. No acute bone abnormality. No lytic or blastic bone lesion. IMPRESSION: 1. Interval improvement in inflammatory stranding and adjacent circumferential bowel wall thickening within the deep pelvis. 2. Slight interval increase in size of a mesenteric abscess within the deep pelvis within the small bowel mesentery of the mid jejunum related to an increasing gaseous component. 3. Severe sigmoid diverticulosis. Scattered small bowel diverticula also identified, particularly involving the loop of small bowel adjacent to the mesenteric abscess, presumably the source of the mesenteric abscess. As noted above, regional inflammatory changes are improved and there is no evidence of obstruction. 4. Mild hepatic steatosis. 5. Dilated left gonadal vein with numerous left adnexal varices suggesting changes of ovarian vein reflux and pelvic venous insufficiency. 6. Aortic atherosclerosis. Aortic Atherosclerosis (ICD10-I70.0). Electronically Signed   By: Helyn Numbers M.D.   On: 08/02/2022 19:23   CT ABDOMEN PELVIS W CONTRAST  Result Date: 07/28/2022 CLINICAL DATA:  Nonlocalized acute abdominal pain EXAM: CT ABDOMEN AND PELVIS WITH CONTRAST TECHNIQUE: Multidetector CT imaging of the abdomen and pelvis was performed using the standard protocol following bolus administration of intravenous contrast. RADIATION DOSE REDUCTION: This exam was performed according to the departmental dose-optimization program which includes automated exposure control, adjustment of the mA and/or kV according to patient size and/or use of iterative reconstruction  technique. CONTRAST:  75mL OMNIPAQUE IOHEXOL 300 MG/ML  SOLN COMPARISON:  CT 06/12/2020 FINDINGS: Lower chest: There is some linear opacity lung bases likely scar or atelectasis. Slight pleural thickening at the left lung base. Calcifications seen along the mitral valve  annulus and along the area of the aortic valve. Hepatobiliary: No focal liver abnormality is seen. No gallstones, gallbladder wall thickening, or biliary dilatation. Patent portal vein Pancreas: Unremarkable. No pancreatic ductal dilatation or surrounding inflammatory changes. Spleen: Normal in size without focal abnormality. Adrenals/Urinary Tract: Stable slight thickening of the left adrenal gland. Right adrenal gland is stable. No collecting system dilatation of either kidney. On portal venous phase there are some subtle patchy areas of incomplete enhancement along each kidney which are less apparent on the delayed dataset. No adjacent inflammatory stranding at this time. Preserved contours of the urinary bladder which is underdistended. Stomach/Bowel: No oral contrast. Large bowel has left-sided colonic diverticulosis. Scattered colonic stool diffusely. Stomach is nondilated. There is extensive edema in the central pelvic mesentery. The small bowel diffusely is nondilated but there is fold thickening along loops of small bowel in the central mesentery near the inflammatory stranding and fluid. There is a collection of mottled gas and debris in this location in the central mesentery on image 50 measuring 2.5 x 2.2 cm. Based on appearance these changes could relate to small bowel diverticulitis. A small abscess is not excluded. No associated obstruction or widespread free air. Vascular/Lymphatic: Aortic atherosclerosis. No enlarged abdominal or pelvic lymph nodes. Reproductive: Uterus is present. Prominent periuterine vessels, left-greater-than-right. Other: No free intra-abdominal air. Musculoskeletal: Scattered degenerative changes of the spine and pelvis. Curvature of the lumbar spine. Mild listhesis. Posterior osteophytes and disc bulging identified at multiple levels, greatest at T12-L1 with central canal stenosis. IMPRESSION: Inflammatory stranding and fluid along the central pelvic mesentery with the  adjacent small bowel loops with wall thickening and edema. There are several diverticula in this location in the changes could relate to small bowel diverticulitis. There is a 2.4 cm mottled gas collection adjacent to small bowel loop in this location which could represent a small abscess. Recommend follow-up to confirm clearance and exclude secondary pathology. No obstruction, widespread free air. Separate colonic diverticulosis. Slightly heterogeneous appearance to both kidneys but no inflammatory stranding. This has have a differential including form of nephritis or vascular abnormality. Please correlate for any signs of pyelonephritis or infection. Otherwise attention on follow-up. Electronically Signed   By: Karen Kays M.D.   On: 07/28/2022 18:08   DG Chest 2 View  Result Date: 07/28/2022 CLINICAL DATA:  weakness EXAM: CHEST - 2 VIEW COMPARISON:  CXR 02/27/20 FINDINGS: No pleural effusion. No pneumothorax. Normal cardiac and mediastinal contours. No focal airspace opacity. No radiographically apparent displaced rib fractures. Visualized upper abdomen is unremarkable. Vertebral body heights are maintained. IMPRESSION: No focal airspace opacity. Electronically Signed   By: Lorenza Cambridge M.D.   On: 07/28/2022 15:18   CT Head Wo Contrast  Result Date: 07/28/2022 CLINICAL DATA:  Altered mental status, weakness, change in walking, change in speech. EXAM: CT HEAD WITHOUT CONTRAST TECHNIQUE: Contiguous axial images were obtained from the base of the skull through the vertex without intravenous contrast. RADIATION DOSE REDUCTION: This exam was performed according to the departmental dose-optimization program which includes automated exposure control, adjustment of the mA and/or kV according to patient size and/or use of iterative reconstruction technique. COMPARISON:  None Available. FINDINGS: Brain: There is no acute intracranial hemorrhage, extra-axial fluid collection, or acute  infarct. There is background  parenchymal volume loss with prominence of the ventricular system and extra-axial CSF spaces, within expected limits for age. There is patchy and confluent hypodensity in the supratentorial white matter likely reflecting sequela of underlying chronic small-vessel ischemic change. There is no mass lesion. There is no mass effect or midline shift. The pituitary and suprasellar region are normal. Vascular: There is calcification of the bilateral carotid siphons. Skull: Choose Sinuses/Orbits: The imaged paranasal sinuses are clear. Bilateral lens implants are in place. The globes and orbits are otherwise unremarkable. Other: None. IMPRESSION: No acute intracranial pathology. Electronically Signed   By: Lesia Hausen M.D.   On: 07/28/2022 15:14   Intravitreal Injection, Pharmacologic Agent - OD - Right Eye  Result Date: 07/16/2022 Time Out 07/16/2022. 1:50 PM. Confirmed correct patient, procedure, site, and patient consented. Anesthesia Topical anesthesia was used. Anesthetic medications included Lidocaine 2%, Proparacaine 0.5%. Procedure Preparation included 5% betadine to ocular surface, eyelid speculum. A (32g) needle was used. Injection: 6 mg faricimab-svoa 6 MG/0.05ML   Route: Intravitreal, Site: Right Eye   NDC: [phone], Lot: [id number], Expiration date: 05/22/2024, Waste: 0 mL Post-op Post injection exam found visual acuity of at least counting fingers. The patient tolerated the procedure well. There were no complications. The patient received written and verbal post procedure care education. Post injection medications were not given.   OCT, Retina - OU - Both Eyes  Result Date: 07/16/2022 Right Eye Quality was good. Central Foveal Thickness: 281. Progression has improved. Findings include no SRF, abnormal foveal contour, retinal drusen , intraretinal hyper-reflective material, epiretinal membrane, intraretinal fluid, outer retinal atrophy (Mild interval improvement in central cystic changes, Partial PVD).  Left Eye Quality was good. Central Foveal Thickness: 292. Progression has been stable. Findings include normal foveal contour, no IRF, no SRF, retinal drusen , outer retinal atrophy (Trace ERM, Partial PVD). 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 CME - cystoid macular edema. PED - pigment epithelial detachment. IRF - intraretinal fluid. SRF - subretinal fluid. EZ - ellipsoid zone. ERM - epiretinal membrane. ORA - outer retinal atrophy. ORT - outer retinal tubulation. SRHM - subretinal hyper-reflective material   Microbiology: Recent Results (from the past 240 hour(s))  SARS Coronavirus 2 by RT PCR (hospital order, performed in [facility] hospital lab) *cepheid single result test* Anterior Nasal Swab     Status: None   Collection Time: 07/28/22  2:21 PM   Specimen: Anterior Nasal Swab  Result Value Ref Range Status   SARS Coronavirus 2 by RT PCR NEGATIVE NEGATIVE Final    Comment: (NOTE) SARS-CoV-2 target nucleic acids are NOT DETECTED.  The SARS-CoV-2 RNA is generally detectable in upper and lower respiratory specimens during the acute phase of infection. The lowest concentration of SARS-CoV-2 viral copies this assay can detect is 250 copies / mL. A negative result does not preclude SARS-CoV-2 infection and should not be used as the sole basis for treatment or other patient management decisions.  A negative result may occur with improper specimen collection / handling, submission of specimen other than nasopharyngeal swab, presence of viral mutation(s) within the areas targeted by this assay, and inadequate number of viral copies (<250 copies / mL). A negative result must be combined with clinical observations, patient history, and epidemiological information.  Fact Sheet  for Patients:  RoadLapTop.co.za  Fact Sheet for Healthcare Providers: http://kim-miller.com/  This test is not yet approved or  cleared by the [place] FDA and has been authorized for detection and/or diagnosis of SARS-CoV-2 by FDA under an Emergency Use Authorization (EUA).  This EUA will remain in effect (meaning this test can be used) for the duration of the COVID-19 declaration under Section 564(b)(1) of the Act, 21 U.S.C. section 360bbb-3(b)(1), unless the authorization is terminated or revoked sooner.  Performed at [facility], [address], [place], [place] [address]   Blood culture (routine x 2)     Status: None   Collection Time: 07/28/22  3:28 PM   Specimen: BLOOD LEFT ARM  Result Value Ref Range Status   Specimen Description   Final    BLOOD LEFT ARM Performed at [facility], [address] [place], [place] [address]    Special Requests   Final    BOTTLES DRAWN AEROBIC AND ANAEROBIC Blood Culture adequate volume Performed at [facility], [address], [place], [place] [address]    Culture   Final    NO GROWTH 5 DAYS Performed at [facility], [address] [place], [place] [address]    Report Status 08/02/2022 FINAL  Final     Labs: Basic Metabolic Panel: Recent Labs  Lab 07/30/22 0909 07/31/22 0610 08/01/22 0541 08/02/22 0534 08/03/22 0527  NA 141 139 140 140 140  K 4.3 3.4* 4.0 3.9 3.7  CL 108 105 108 105 106  CO2 23 24 23 26 26   GLUCOSE 108* 94 94 98 97  BUN 6* 6* 6* 8 11  CREATININE 0.69 0.67 0.51 0.81 0.85  CALCIUM 8.0* 7.8* 8.6* 9.0 8.8*  MG  --  1.9 2.0  --   --    Liver Function Tests: Recent Labs  Lab 07/28/22 1528 07/29/22 0545 07/30/22 0909 07/31/22 0610  AST 40 35 37 33  ALT 64* 55* 42 40  ALKPHOS 64 78 92 90  BILITOT 1.5* 1.2 1.4* 0.5  PROT 5.9* 5.7* 5.9* 5.8*  ALBUMIN 3.9 3.2* 3.1* 3.0*   No results for input(s):  "LIPASE", "AMYLASE" in the last 168 hours. No results for input(s): "AMMONIA" in the last 168 hours. CBC: Recent Labs  Lab 07/28/22 1319 07/29/22 0545 07/30/22 0909 07/31/22 0610 08/01/22 0541  WBC 18.0* 12.7* 7.7 5.0 5.1  HGB 14.1 11.9* 11.8* 11.6* 11.7*  HCT 41.6 35.6* 35.9* 35.6* 36.3  MCV 90.6 93.2 94.5 94.2 93.6  PLT 225 176 198 227 233   Cardiac Enzymes: Recent Labs  Lab 07/28/22 1528  CKTOTAL 60   BNP: BNP (last 3 results) No results for input(s): "BNP" in the last 8760 hours.  ProBNP (last 3 results) No results for input(s): "PROBNP" in the last 8760 hours.  CBG: Recent Labs  Lab 07/30/22 0743 07/31/22 0740 08/01/22 0751 08/02/22 0721 08/03/22 0813  GLUCAP 98 93 91 93 132*       Signed:  Rhetta Mura MD   [facility] 08/03/2022, 11:24 AM

## 2022-08-03 NOTE — Progress Notes (Signed)
Subjective/Chief Complaint: Moving her bowels. Tolerating soft diet with no issues.  CT scan reviewed and discussed with patient and her daughter at bedside today.  Eager to go home.   Objective: Vital signs in last 24 hours: Temp:  [97.6 F (36.4 C)-98.5 F (36.9 C)] 97.6 F (36.4 C) (06/11 0416) Pulse Rate:  [61-66] 65 (06/11 0416) Resp:  [15-18] 18 (06/11 0416) BP: (118-170)/(61-78) 136/75 (06/11 0416) SpO2:  [96 %-98 %] 96 % (06/11 0416) Weight:  [59.9 kg] 59.9 kg (06/11 0500) Last BM Date : 08/02/22  Intake/Output from previous day: 06/10 0701 - 06/11 0700 In: 1337.8 [P.O.:837; IV Piggyback:500.8] Out: -  Intake/Output this shift: Total I/O In: 240 [P.O.:240] Out: -   Ab soft nontender nondistended  Lab Results:  Recent Labs    08/01/22 0541  WBC 5.1  HGB 11.7*  HCT 36.3  PLT 233   BMET Recent Labs    08/02/22 0534 08/03/22 0527  NA 140 140  K 3.9 3.7  CL 105 106  CO2 26 26  GLUCOSE 98 97  BUN 8 11  CREATININE 0.81 0.85  CALCIUM 9.0 8.8*   PT/INR No results for input(s): "LABPROT", "INR" in the last 72 hours. ABG No results for input(s): "PHART", "HCO3" in the last 72 hours.  Invalid input(s): "PCO2", "PO2"  Studies/Results: CT ABDOMEN PELVIS W CONTRAST  Result Date: 08/02/2022 CLINICAL DATA:  Small-bowel diverticulitis, follow-up examination EXAM: CT ABDOMEN AND PELVIS WITH CONTRAST TECHNIQUE: Multidetector CT imaging of the abdomen and pelvis was performed using the standard protocol following bolus administration of intravenous contrast. RADIATION DOSE REDUCTION: This exam was performed according to the departmental dose-optimization program which includes automated exposure control, adjustment of the mA and/or kV according to patient size and/or use of iterative reconstruction technique. CONTRAST:  OMNIPAQUE IOHEXOL 300 MG/ML  SOLN COMPARISON:  07/28/2022 FINDINGS: Lower chest: No acute abnormality. Hepatobiliary: Mild hepatic  steatosis. No enhancing intrahepatic mass. No intra or extrahepatic biliary ductal dilation. Gallbladder unremarkable. Pancreas: Unremarkable Spleen: Unremarkable Adrenals/Urinary Tract: Adrenal glands are unremarkable. Kidneys are normal, without renal calculi, focal lesion, or hydronephrosis. Bladder is unremarkable. Stomach/Bowel: Previously noted inflammatory stranding and adjacent circumferential bowel wall thickening within the deep pelvis has improved in the interval since prior examination. A loculated gas and debris collection compatible with a a mesenteric abscess is again seen within the deep pelvis within the small bowel mesentery of the mid jejunum and appears slightly larger in size measuring 2.0 x 3.0 cm in dimension though this is related to a enlarging gaseous component. There is severe sigmoid diverticulosis again identified. There are scattered small bowel diverticula also identified, particularly involving the loop of small bowel adjacent to the mesenteric abscess. No free intraperitoneal gas or fluid. No evidence of obstruction. The stomach, small bowel, and large bowel are otherwise unremarkable the appendix is not clearly identified, however, no secondary signs of appendicitis are seen within the right lower quadrant. Vascular/Lymphatic: Aortic atherosclerosis. The left gonadal vein is dilated and there are numerous left adnexal varices identified suggesting changes of ovarian vein reflux and pelvic venous insufficiency. No enlarged abdominal or pelvic lymph nodes. Reproductive: Uterus and bilateral adnexa are unremarkable. Other: No abdominal wall hernia Musculoskeletal: Osseous structures are age-appropriate. No acute bone abnormality. No lytic or blastic bone lesion. IMPRESSION: 1. Interval improvement in inflammatory stranding and adjacent circumferential bowel wall thickening within the deep pelvis. 2. Slight interval increase in size of a mesenteric abscess within the deep pelvis within  the  small bowel mesentery of the mid jejunum related to an increasing gaseous component. 3. Severe sigmoid diverticulosis. Scattered small bowel diverticula also identified, particularly involving the loop of small bowel adjacent to the mesenteric abscess, presumably the source of the mesenteric abscess. As noted above, regional inflammatory changes are improved and there is no evidence of obstruction. 4. Mild hepatic steatosis. 5. Dilated left gonadal vein with numerous left adnexal varices suggesting changes of ovarian vein reflux and pelvic venous insufficiency. 6. Aortic atherosclerosis. Aortic Atherosclerosis (ICD10-I70.0). Electronically Signed   By: Helyn Numbers M.D.   On: 08/02/2022 19:23    Anti-infectives: Anti-infectives (From admission, onward)    Start     Dose/Rate Route Frequency Ordered Stop   07/29/22 1900  cefTRIAXone (ROCEPHIN) 2 g in sodium chloride 0.9 % 100 mL IVPB        2 g 200 mL/hr over 30 Minutes Intravenous Every 24 hours 07/29/22 0517     07/29/22 0700  metroNIDAZOLE (FLAGYL) IVPB 500 mg        500 mg 100 mL/hr over 60 Minutes Intravenous Every 12 hours 07/29/22 0517     07/28/22 1830  cefTRIAXone (ROCEPHIN) 2 g in sodium chloride 0.9 % 100 mL IVPB        2 g 200 mL/hr over 30 Minutes Intravenous  Once 07/28/22 1821 07/28/22 1932   07/28/22 1830  metroNIDAZOLE (FLAGYL) IVPB 500 mg        500 mg 100 mL/hr over 60 Minutes Intravenous  Once 07/28/22 1821 07/28/22 2000       Assessment/Plan: Small bowel diverticulitis with 2.4cm abscess -cont oral abx at home for 3 weeks. -CT reviewed and discussed with patient and daughter -soft diet -no urgent needs for surgery  -she understands with repeated episode surgery best treatment but at 89 she would like to see if this will get better conservatively again and I dont disagree -stable for DC home today.  Will have her follow up with Dr. [name] in 3 weeks in the office.  Discussed reasons to call our office, DC diet  instructions, etc -d/w primary service   FEN: soft ID: rocephin/flagyl, Cipro/Flagyl given PCN allergy VTE: heparin subq  I reviewed hospitalist notes, last 24 h vitals and pain scores, last 48 h intake and output, last 24 h labs and trends, and last 24 h imaging results.   Letha Cape 08/03/2022

## 2022-08-03 NOTE — Progress Notes (Signed)
Mobility Specialist - Progress Note   08/03/22 0910  Mobility  Activity Ambulated with assistance in hallway  Level of Assistance Standby assist, set-up cues, supervision of patient - no hands on  Assistive Device Front wheel walker  Distance Ambulated (ft) 180 ft  Range of Motion/Exercises Active  Activity Response Tolerated well  Mobility Referral Yes  $Mobility charge 1 Mobility  Mobility Specialist Start Time (ACUTE ONLY) 0900  Mobility Specialist Stop Time (ACUTE ONLY) 0910  Mobility Specialist Time Calculation (min) (ACUTE ONLY) 10 min   Pt received in bed and agreed to mobility. Had no issues throughout session. Returned to chair with all needs met and family in room.  Marilynne Halsted Mobility Specialist

## 2022-08-04 ENCOUNTER — Telehealth: Payer: Self-pay

## 2022-08-04 NOTE — Transitions of Care (Post Inpatient/ED Visit) (Signed)
08/04/2022  Name: Margaret [name] MRN: [id number] DOB: Oct 14, 1929  Today's TOC FU Call Status: Today's TOC FU Call Status:: Successful TOC FU Call Competed TOC FU Call Complete Date: 08/04/22  Transition Care Management Follow-up Telephone Call Date of Discharge: 08/03/22 Discharge Facility: [facility]) Type of Discharge: Inpatient Admission Primary Inpatient Discharge Diagnosis:: Acute diverticulitis How have you been since you were released from the hospital?: Better Any questions or concerns?: Yes  Items Reviewed: Did you receive and understand the discharge instructions provided?: Yes Medications obtained,verified, and reconciled?: Yes (Medications Reviewed) Any new allergies since your discharge?: No Dietary orders reviewed?: Yes Do you have support at home?: Yes  Medications Reviewed Today: Medications Reviewed Today     Reviewed by Merleen Nicely, LPN (Licensed Practical Nurse) on 08/04/22 at 1357  Med List Status: <None>   Medication Order Taking? Sig Documenting Provider Last Dose Status Informant  acetaminophen (TYLENOL) 325 MG tablet [id number] Yes Take 2 tablets (650 mg total) by mouth every 6 (six) hours as needed for moderate pain or headache. Rhetta Mura, MD Taking Active   albuterol (VENTOLIN HFA) 108 (90 Base) MCG/ACT inhaler [id number] Yes Inhale 2 puffs into the lungs every 6 (six) hours as needed for wheezing or shortness of breath. [name], DO Taking Active Self, Pharmacy Records  ciprofloxacin (CIPRO) 500 MG tablet [id number] Yes Take 1 tablet (500 mg total) by mouth 2 (two) times daily for 28 days. Rhetta Mura, MD Taking Active   cycloSPORINE (RESTASIS) 0.05 % ophthalmic emulsion [id number] Yes Place 1 drop into both eyes 2 (two) times daily. [provider] Taking Active Self, Pharmacy Records  levothyroxine (SYNTHROID) 50 MCG tablet [id number] Yes Take 1 tablet (50 mcg total) by mouth daily before breakfast. Claiborne Billings,  [name], DO Taking Active Self, Pharmacy Records  metroNIDAZOLE (FLAGYL) 500 MG tablet [id number] Yes Take 1 tablet (500 mg total) by mouth 2 (two) times daily for 28 days. Rhetta Mura, MD Taking Active   montelukast (SINGULAIR) 10 MG tablet [id number] Yes Take 1 tablet (10 mg total) by mouth at bedtime. [name], DO Taking Active Self, Pharmacy Records  Multiple Vitamins-Minerals (PRESERVISION AREDS 2) CAPS [id number] Yes Take 1 capsule by mouth 2 (two) times daily. [provider] Taking Active Self, Pharmacy Records  ondansetron (ZOFRAN) 4 MG tablet [id number] Yes Take 1 tablet (4 mg total) by mouth every 6 (six) hours as needed for nausea. Rhetta Mura, MD Taking Active   polyvinyl alcohol (LIQUIFILM TEARS) 1.4 % ophthalmic solution [id number] Yes Place 1 drop into both eyes as needed for dry eyes. [provider] Taking Active Self, Pharmacy Records  pregabalin (LYRICA) 25 MG capsule [id number] Yes Take 1 capsule (25 mg total) by mouth at bedtime. Jeoffrey Massed, MD Taking Active Self, Pharmacy Records            Home Care and Equipment/Supplies: Were Home Health Services Ordered?: No Any new equipment or medical supplies ordered?: No  Functional Questionnaire: Do you need assistance with bathing/showering or dressing?: No Do you need assistance with meal preparation?: No Do you need assistance with eating?: No Do you have difficulty maintaining continence: No Do you need assistance with getting out of bed/getting out of a chair/moving?: No Do you have difficulty managing or taking your medications?: No  Follow up appointments reviewed: PCP Follow-up appointment confirmed?: Yes Date of PCP follow-up appointment?: 08/11/22 Follow-up Provider: Felix Pacini DO Specialist Hospital Follow-up appointment confirmed?: No Do you need transportation to your  follow-up appointment?: No Do you understand care options if your condition(s) worsen?:  Yes-patient verbalized understanding    SIGNATURE  Woodfin Ganja LPN [facility] Nurse Health Advisor Direct Dial [phone]

## 2022-08-11 ENCOUNTER — Encounter: Payer: Self-pay | Admitting: Family Medicine

## 2022-08-11 ENCOUNTER — Ambulatory Visit (INDEPENDENT_AMBULATORY_CARE_PROVIDER_SITE_OTHER): Payer: Medicare HMO | Admitting: Family Medicine

## 2022-08-11 VITALS — BP 108/68 | HR 64 | Temp 97.5°F | Wt 128.8 lb

## 2022-08-11 DIAGNOSIS — M359 Systemic involvement of connective tissue, unspecified: Secondary | ICD-10-CM

## 2022-08-11 DIAGNOSIS — K5712 Diverticulitis of small intestine without perforation or abscess without bleeding: Secondary | ICD-10-CM | POA: Diagnosis not present

## 2022-08-11 DIAGNOSIS — R768 Other specified abnormal immunological findings in serum: Secondary | ICD-10-CM | POA: Diagnosis not present

## 2022-08-11 DIAGNOSIS — M5136 Other intervertebral disc degeneration, lumbar region: Secondary | ICD-10-CM

## 2022-08-11 DIAGNOSIS — M353 Polymyalgia rheumatica: Secondary | ICD-10-CM

## 2022-08-11 DIAGNOSIS — E038 Other specified hypothyroidism: Secondary | ICD-10-CM | POA: Diagnosis not present

## 2022-08-11 DIAGNOSIS — E063 Autoimmune thyroiditis: Secondary | ICD-10-CM

## 2022-08-11 DIAGNOSIS — M0600 Rheumatoid arthritis without rheumatoid factor, unspecified site: Secondary | ICD-10-CM

## 2022-08-11 LAB — COMPREHENSIVE METABOLIC PANEL
ALT: 45 U/L — ABNORMAL HIGH (ref 0–35)
AST: 41 U/L — ABNORMAL HIGH (ref 0–37)
Albumin: 4.1 g/dL (ref 3.5–5.2)
Alkaline Phosphatase: 65 U/L (ref 39–117)
BUN: 14 mg/dL (ref 6–23)
CO2: 26 mEq/L (ref 19–32)
Calcium: 9.2 mg/dL (ref 8.4–10.5)
Chloride: 101 mEq/L (ref 96–112)
Creatinine, Ser: 0.78 mg/dL (ref 0.40–1.20)
GFR: 66.6 mL/min (ref >60.00)
Glucose, Bld: 92 mg/dL (ref 70–99)
Potassium: 4.1 mEq/L (ref 3.5–5.1)
Sodium: 136 mEq/L (ref 135–145)
Total Bilirubin: 0.6 mg/dL (ref 0.2–1.2)
Total Protein: 6 g/dL (ref 6.0–8.3)

## 2022-08-11 LAB — CBC WITH DIFFERENTIAL/PLATELET
Basophils Absolute: 0.1 10*3/uL (ref 0.0–0.1)
Basophils Relative: 0.9 % (ref 0.0–3.0)
Eosinophils Absolute: 0.2 10*3/uL (ref 0.0–0.7)
Eosinophils Relative: 2.5 % (ref 0.0–5.0)
HCT: 39.6 % (ref 36.0–46.0)
Hemoglobin: 13 g/dL (ref 12.0–15.0)
Lymphocytes Relative: 10.3 % — ABNORMAL LOW (ref 12.0–46.0)
Lymphs Abs: 0.7 10*3/uL (ref 0.7–4.0)
MCHC: 32.8 g/dL (ref 30.0–36.0)
MCV: 91.4 fl (ref 78.0–100.0)
Monocytes Absolute: 0.7 10*3/uL (ref 0.1–1.0)
Monocytes Relative: 10.3 % (ref 3.0–12.0)
Neutro Abs: 5.2 10*3/uL (ref 1.4–7.7)
Neutrophils Relative %: 76 % (ref 43.0–77.0)
Platelets: 314 10*3/uL (ref 150.0–400.0)
RBC: 4.33 Mil/uL (ref 3.87–5.11)
RDW: 13 % (ref 11.5–15.5)
WBC: 6.8 10*3/uL (ref 4.0–10.5)

## 2022-08-11 MED ORDER — PREGABALIN 25 MG PO CAPS: 25.0000 mg | ORAL_CAPSULE | Freq: Every day | ORAL | 1 refills | Status: DC

## 2022-08-11 NOTE — Progress Notes (Addendum)
Margaret [name] , 01/13/1930, 89 y.o., female MRN: [id number] Patient Care Team    Relationship Specialty Notifications Start End  [name], DO PCP - General Family Medicine  03/08/19   Jodelle Red, MD PCP - Cardiology Cardiology  04/30/22   Rennis Chris, MD Consulting Physician Ophthalmology  03/29/18   Grant Fontana, MD Consulting Physician Ophthalmology  03/29/18   Jacqlyn Krauss, MD Referring Physician Dermatology  08/02/19   Rossie Muskrat, MD  Rheumatology  08/02/19     Chief Complaint  Patient presents with   Diverticulitis    Hosp admission     Subjective:  Margaret [name]  is a 89 y.o. female presents for hospital follow up after recent admission on 07/28/2022 for primary diagnosis diverticulitis with abscess. Patient was discharged on 08/03/2022 to home. Patients discharge summary has been reviewed, as well as all labs/image studies obtained during hospitalization.  Medication reconciliation completed today.  Patients hospital course: Patient was admitted for small bowel diverticulitis with 2.5 cm abscess.  White blood cell count 18 on admission 5.1 on discharge.  Hemoglobin 14.1 on admission and 11.7 on discharge.  Her prednisone was discontinued and inpt team recommended not using prednisone long term for her rheumatological condition. Dr. Kathi Ludwig is her rheumatologist.  She would like referral to rheumatologist within [facility]. Since hospital discharge patient reports overall she feels she is doing much better.  Her pain is very mild and intermittent now.  She is attempting to eat a low fiber diet.  She is not eating as well as she is used to, but this is improving since discharge.  She is attempting to hydrate well.  She is having nonpainful bowel movements, but reports they have been loose since her hospital stay.  She states they are darker than usual but not black or tarry.  Although she still has some weakness, she is improving daily with her strength.  She  is taking the Cipro and Flagyl as prescribed by inpatient team, and it is to continue for 4 weeks.  She has follow-up with her surgeon mid July.    No results for input(s): "HGB", "HCT", "WBC", "PLT" in the last 168 hours.    Latest Ref Rng & Units 08/03/2022    5:27 AM 08/02/2022    5:34 AM 08/01/2022    5:41 AM  CMP  Glucose 70 - 99 mg/dL 97  98  94   BUN 8 - 23 mg/dL 11  8  6    Creatinine 0.44 - 1.00 mg/dL 4.09  8.11  9.14   Sodium 135 - 145 mmol/L 140  140  140   Potassium 3.5 - 5.1 mmol/L 3.7  3.9  4.0   Chloride 98 - 111 mmol/L 106  105  108   CO2 22 - 32 mmol/L 26  26  23    Calcium 8.9 - 10.3 mg/dL 8.8  9.0  8.6       CT ABDOMEN PELVIS W CONTRAST Result Date: 08/02/2022 IMPRESSION: 1. Interval improvement in inflammatory stranding and adjacent circumferential bowel wall thickening within the deep pelvis. 2. Slight interval increase in size of a mesenteric abscess within the deep pelvis within the small bowel mesentery of the mid jejunum related to an increasing gaseous component. 3. Severe sigmoid diverticulosis. Scattered small bowel diverticula also identified, particularly involving the loop of small bowel adjacent to the mesenteric abscess, presumably the source of the mesenteric abscess. As noted above, regional inflammatory changes are improved and  there is no evidence of obstruction. 4. Mild hepatic steatosis. 5. Dilated left gonadal vein with numerous left adnexal varices suggesting changes of ovarian vein reflux and pelvic venous insufficiency. 6. Aortic atherosclerosis. Aortic Atherosclerosis (ICD10-I70.0). Electronically Signed   By: Helyn Numbers M.D.   On: 08/02/2022 19:23   CT ABDOMEN PELVIS W CONTRAST Result Date: 07/28/2022 IMPRESSION: Inflammatory stranding and fluid along the central pelvic mesentery with the adjacent small bowel loops with wall thickening and edema. There are several diverticula in this location in the changes could relate to small bowel  diverticulitis. There is a 2.4 cm mottled gas collection adjacent to small bowel loop in this location which could represent a small abscess. Recommend follow-up to confirm clearance and exclude secondary pathology. No obstruction, widespread free air. Separate colonic diverticulosis. Slightly heterogeneous appearance to both kidneys but no inflammatory stranding. This has have a differential including form of nephritis or vascular abnormality. Please correlate for any signs of pyelonephritis or infection. Otherwise attention on follow-up. Electronically Signed   By: Karen Kays M.D.   On: 07/28/2022 18:08   DG Chest 2 View Result Date: 07/28/2022 IMPRESSION: No focal airspace opacity. Electronically Signed   By: Lorenza Cambridge M.D.   On: 07/28/2022 15:18   CT Head Wo Contrast Result Date: 07/28/2022 IMPRESSION: No acute intracranial pathology. Electronically Signed   By: Lesia Hausen M.D.   On: 07/28/2022 15:14        07/23/2022   11:16 AM 06/16/2022   10:22 AM 10/24/2021    9:10 AM 12/09/2020    9:53 AM 10/22/2020    1:13 PM  Depression screen PHQ 2/9  Decreased Interest 0 0 0 0 0  Down, Depressed, Hopeless 0 0 0 0 0  PHQ - 2 Score 0 0 0 0 0  Altered sleeping 1      Tired, decreased energy 1      Change in appetite 0      Feeling bad or failure about yourself  0      Trouble concentrating 1      Moving slowly or fidgety/restless 0      Suicidal thoughts 0      PHQ-9 Score 3      Difficult doing work/chores Not difficult at all        Allergies  Allergen Reactions   Augmentin [Amoxicillin-Pot Clavulanate] Swelling and Rash    Swelling in face   Lactose Intolerance (Gi)    Social History   Tobacco Use   Smoking status: Former    Packs/day: .5    Types: Cigarettes    Start date: 1952    Quit date: 1965    Years since quitting: 59.5   Smokeless tobacco: Never  Substance Use Topics   Alcohol use: Not Currently    Alcohol/week: 5.0 standard drinks of alcohol    Types: 5  Standard drinks or equivalent per week   Past Medical History:  Diagnosis Date   Allergy    Arthritis of left knee    Asthma    Carotid artery stenosis 10/17/2012   stable report in 2018- "moderate on right, minimal on left"   Carpal tunnel syndrome 2018   left.   Cervical radiculopathy 05/16/2015   DDD C4-C5, C5-C6, and C6-C7.  Facet arthropathy throughout cervical spine.  Normal foraminal narrowing secondary to uncovertebral  arthropathy is seen bilaterally at C3-C4 and C5/C6   Chicken pox    DDD (degenerative disc disease), lumbar 04/26/2015   Mild levoscoliosis,  right anterior listhesis of L4 and L5.  DDD at every level.  Facet arthropathy at multiple levels.   Deviated septum    Glaucoma    both eyes, mild opened angle   Heart murmur    History of colon polyps    History of fall    History of frequent urinary tract infections    klebs- pansensitive. c. 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    Pseudophakia of both eyes    PVD (peripheral vascular disease) (HCC)    Rheumatic fever    89 years old    Syncope 05/17/2017   Thyroid disease    Urinary incontinence    Vertigo    had been prescribed antivert   Vitamin D deficiency    Past Surgical History:  Procedure Laterality Date   CARPAL TUNNEL RELEASE Bilateral 2019   CATARACT EXTRACTION Bilateral 1997   COLONOSCOPY  2010   NASAL SEPTUM SURGERY  2007   WRIST FRACTURE SURGERY Right 1996   Family History  Problem Relation Age of Onset   Arthritis Mother    COPD Mother    Prostate cancer Father    Arthritis Father    Hearing loss Father    Heart disease Father    Heart attack Father     Stroke Maternal Grandmother    Liver cancer Sister    Arthritis Brother    Prostate cancer Brother    COPD Brother    Heart disease Brother    Breast cancer Daughter    Blindness Maternal Grandfather    Stroke Maternal Grandfather    COPD Paternal Grandmother    Breast cancer Paternal Grandmother    Allergies as of 08/11/2022       Reactions   Augmentin [amoxicillin-pot Clavulanate] Swelling, Rash   Swelling in face   Lactose Intolerance (gi)         Medication List        Accurate as of August 11, 2022 12:36 PM. If you have any questions, ask your nurse or doctor.          acetaminophen 325 MG tablet Commonly known as: TYLENOL Take 2 tablets (650 mg total) by mouth every 6 (six) hours as needed for moderate pain or headache.   albuterol 108 (90 Base) MCG/ACT inhaler Commonly known as: VENTOLIN HFA Inhale 2 puffs into the lungs every 6 (six) hours as needed for wheezing or shortness of breath.   ciprofloxacin 500 MG tablet Commonly known as: Cipro Take 1 tablet (500 mg total) by mouth 2 (two) times daily for 28 days.   cycloSPORINE 0.05 % ophthalmic emulsion Commonly known as: RESTASIS Place 1 drop into both eyes 2 (two) times daily.   levothyroxine 50 MCG tablet Commonly known as: SYNTHROID Take 1 tablet (50 mcg total) by mouth daily before breakfast.   metroNIDAZOLE 500 MG tablet Commonly known as: Flagyl Take 1 tablet (500 mg total) by mouth 2 (two) times daily for 28 days.   montelukast 10 MG tablet Commonly known as: SINGULAIR Take 1 tablet (10 mg total) by mouth at bedtime.  ondansetron 4 MG tablet Commonly known as: ZOFRAN Take 1 tablet (4 mg total) by mouth every 6 (six) hours as needed for nausea.   polyvinyl alcohol 1.4 % ophthalmic solution Commonly known as: LIQUIFILM TEARS Place 1 drop into both eyes as needed for dry eyes.   pregabalin 25 MG capsule Commonly known as: Lyrica Take 1 capsule (25 mg total) by mouth at bedtime.    PreserVision AREDS 2 Caps Take 1 capsule by mouth 2 (two) times daily.        All past medical history, surgical history, allergies, family history, immunizations and medications were updated in the EMR today and reviewed under the history and medication portions of their EMR.      ROS: Negative, with the exception of above mentioned in HPI   Objective:  BP 108/68   Pulse 64   Temp (!) 97.5 F (36.4 C)   Wt 128 lb 12.8 oz (58.4 kg)   SpO2 97%   BMI 25.15 kg/m  Body mass index is 25.15 kg/m. Physical Exam Vitals and nursing note reviewed.  Constitutional:      General: She is not in acute distress.    Appearance: Normal appearance. She is not ill-appearing, toxic-appearing or diaphoretic.  HENT:     Head: Normocephalic and atraumatic.  Eyes:     General: No scleral icterus.       Right eye: No discharge.        Left eye: No discharge.     Extraocular Movements: Extraocular movements intact.     Conjunctiva/sclera: Conjunctivae normal.     Pupils: Pupils are equal, round, and reactive to light.  Cardiovascular:     Rate and Rhythm: Normal rate and regular rhythm.  Pulmonary:     Effort: Pulmonary effort is normal. No respiratory distress.     Breath sounds: Normal breath sounds. No wheezing, rhonchi or rales.  Abdominal:     General: Abdomen is flat. Bowel sounds are normal. There is no distension.     Palpations: Abdomen is soft.     Tenderness: There is no abdominal tenderness.  Musculoskeletal:     Right lower leg: No edema.     Left lower leg: No edema.  Skin:    General: Skin is warm.     Findings: No rash.  Neurological:     Mental Status: She is alert and oriented to person, place, and time. Mental status is at baseline.     Motor: No weakness.     Gait: Gait normal.  Psychiatric:        Mood and Affect: Mood normal.        Behavior: Behavior normal.        Thought Content: Thought content normal.        Judgment: Judgment normal.        Assessment/Plan: Margaret [name] is a 89 y.o. female present for OV for Hospital discharge follow up Diverticulitis small intestine w/o perforation or abscess w/o bleeding Low fiber diet discussed Use Cipro/Flagyl total 28 days status postdischarge Patient has follow-up with surgeon in July - CBC w/Diff - Comp Met (CMET) Emergent precautions discussed Patient reports she has prescription for Zofran she has some nausea but is not using the Zofran routinely.  Hypothyroidism due to Hashimoto's thyroiditis - TSH; Future- 4 week lab appt only   ANA positive/DDD (degenerative disc disease), lumbar/Polymyalgia rheumatica (HCC)/Connective tissue disease (HCC)/Seronegative rheumatoid arthritis (HCC) No longer candidate for chronic steroid therapy. - Ambulatory referral to Rheumatology  Reviewed expectations re: course of current medical issues. Discussed self-management of symptoms. Outlined signs and symptoms indicating need for more acute intervention. Patient verbalized understanding and all questions were answered. Patient received an After-Visit Summary. Any changes in medications were reviewed and patient was provided with updated med list with their AVS.     Orders Placed This Encounter  Procedures   CBC w/Diff   Comp Met (CMET)   TSH     Note is dictated utilizing voice recognition software. Although note has been proof read prior to signing, occasional typographical errors still can be missed. If any questions arise, please do not hesitate to call for verification.   electronically signed by:  Felix Pacini, DO  [facility] [place]

## 2022-08-11 NOTE — Patient Instructions (Addendum)
Return if symptoms worsen or fail to improve.        Great to see you today.  I have refilled the medication(s) we provide.   If labs were collected, we will inform you of lab results once received either by echart message or telephone call.   - echart message- for normal results that have been seen by the patient already.   - telephone call: abnormal results or if patient has not viewed results in their echart.  

## 2022-08-11 NOTE — Addendum Note (Signed)
Addended by: Felix Pacini A on: 08/11/2022 12:42 PM   Modules accepted: Orders

## 2022-08-16 ENCOUNTER — Encounter: Payer: Self-pay | Admitting: Family Medicine

## 2022-08-18 NOTE — Telephone Encounter (Signed)
Please advise patient's proxy, that we are not able to schedule for another physician. New rheumatology appts , or most speciality, take 3-6 months to get in to them.   There are other rheumatologist, but not in the [facility] system as patient requested.  We could place a new referral outside of system- but probably will still take at least 3 months to get her in  or   she an temporarily follow back up with the rheum she is already established, while waiting on Nov. Appt.

## 2022-08-18 NOTE — Progress Notes (Signed)
[facility] - Clinic Note  08/20/2022    CHIEF COMPLAINT Patient presents for Retina Follow Up   HISTORY OF PRESENT ILLNESS: Margaret [name] is a 89 y.o. female who presents to the clinic today for:   HPI     Retina Follow Up   Patient presents with  Wet AMD.  In right eye.  This started months ago.  Duration of 4 weeks.  Since onset it is stable.  I, the attending physician,  performed the HPI with the patient and updated documentation appropriately.        Comments   Patient feels that the vision is the same. She is using Restasis OU BID. Patient was in the hospital for an intestinal cyst.      Last edited by Rennis Chris, MD on 08/20/2022  3:19 PM.     Pt states she has been in the hospital for 5 days due to intestinal cysts, she was on IV antibiotics   Referring physician: Felix Pacini A, DO [address] [place],  [place] [address]  HISTORICAL INFORMATION:  Selected notes from the MEDICAL RECORD NUMBER Self referral for macular degeneration LEE: 03.21.19 (Dr. Prentice Docker in [place], [place]) [BCVA: OD: 20/40 OS: 20/40] Ocular Hx-POAG, non-exu ARMD, DES, pseudo, YAG PMH-astham, arthritis,    CURRENT MEDICATIONS: Current Outpatient Medications (Ophthalmic Drugs)  Medication Sig   cycloSPORINE (RESTASIS) 0.05 % ophthalmic emulsion Place 1 drop into both eyes 2 (two) times daily.   polyvinyl alcohol (LIQUIFILM TEARS) 1.4 % ophthalmic solution Place 1 drop into both eyes as needed for dry eyes.   No current facility-administered medications for this visit. (Ophthalmic Drugs)   Current Outpatient Medications (Other)  Medication Sig   acetaminophen (TYLENOL) 325 MG tablet Take 2 tablets (650 mg total) by mouth every 6 (six) hours as needed for moderate pain or headache.   albuterol (VENTOLIN HFA) 108 (90 Base) MCG/ACT inhaler Inhale 2 puffs into the lungs every 6 (six) hours as needed for wheezing or shortness of breath.   ciprofloxacin (CIPRO) 500 MG tablet  Take 1 tablet (500 mg total) by mouth 2 (two) times daily for 28 days.   levothyroxine (SYNTHROID) 50 MCG tablet Take 1 tablet (50 mcg total) by mouth daily before breakfast.   metroNIDAZOLE (FLAGYL) 500 MG tablet Take 1 tablet (500 mg total) by mouth 2 (two) times daily for 28 days.   montelukast (SINGULAIR) 10 MG tablet Take 1 tablet (10 mg total) by mouth at bedtime.   Multiple Vitamins-Minerals (PRESERVISION AREDS 2) CAPS Take 1 capsule by mouth 2 (two) times daily.   ondansetron (ZOFRAN) 4 MG tablet Take 1 tablet (4 mg total) by mouth every 6 (six) hours as needed for nausea.   pregabalin (LYRICA) 25 MG capsule Take 1 capsule (25 mg total) by mouth at bedtime.   No current facility-administered medications for this visit. (Other)   REVIEW OF SYSTEMS: ROS   Positive for: Musculoskeletal, Endocrine, Eyes Negative for: Constitutional, Gastrointestinal, Neurological, Skin, Genitourinary, HENT, Cardiovascular, Respiratory, Psychiatric, Allergic/Imm, Heme/Lymph Last edited by Charlette Caffey, COT on 08/20/2022 12:39 PM.     ALLERGIES Allergies  Allergen Reactions   Augmentin [Amoxicillin-Pot Clavulanate] Swelling and Rash    Swelling in face   Lactose Intolerance (Gi)    PAST MEDICAL HISTORY Past Medical History:  Diagnosis Date   Allergy    Arthritis of left knee    Asthma    Carotid artery stenosis 10/17/2012   stable report in 2018- "moderate  on right, minimal on left"   Carpal tunnel syndrome 2018   left.   Cervical radiculopathy 05/16/2015   DDD C4-C5, C5-C6, and C6-C7.  Facet arthropathy throughout cervical spine.  Normal foraminal narrowing secondary to uncovertebral  arthropathy is seen bilaterally at C3-C4 and C5/C6   Chicken pox    DDD (degenerative disc disease), lumbar 04/26/2015   Mild levoscoliosis, right anterior listhesis of L4 and L5.  DDD at every level.  Facet arthropathy at multiple levels.   Deviated septum    Glaucoma    both eyes, mild opened angle    Heart murmur    History of colon polyps    History of fall    History of frequent urinary tract infections    klebs- pansensitive. c. 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    Pseudophakia of both eyes    PVD (peripheral vascular disease) (HCC)    Rheumatic fever    89 years old    Skin lesion 12/31/2021   Syncope 05/17/2017   Thyroid disease    Urinary incontinence    Vertigo    had been prescribed antivert   Vitamin D deficiency    Past Surgical History:  Procedure Laterality Date   CARPAL TUNNEL RELEASE Bilateral 2019   CATARACT EXTRACTION Bilateral 1997   COLONOSCOPY  2010   NASAL SEPTUM SURGERY  2007   WRIST FRACTURE SURGERY Right 1996   FAMILY HISTORY Family History  Problem Relation Age of Onset   Arthritis Mother    COPD Mother    Prostate cancer Father    Arthritis Father    Hearing loss Father    Heart disease Father    Heart attack Father    Stroke Maternal Grandmother    Liver cancer Sister    Arthritis Brother    Prostate cancer Brother    COPD Brother    Heart disease Brother    Breast cancer Daughter    Blindness Maternal Grandfather    Stroke Maternal Grandfather    COPD Paternal Grandmother    Breast cancer Paternal Grandmother    SOCIAL HISTORY Social History   Tobacco Use   Smoking status: Former    Packs/day: .5    Types: Cigarettes    Start date: 1952    Quit date: 1965    Years since quitting: 59.5   Smokeless tobacco: Never  Vaping Use   Vaping Use: Never used  Substance Use Topics   Alcohol use: Not Currently    Alcohol/week: 5.0 standard drinks of alcohol    Types: 5 Standard drinks or equivalent per  week   Drug use: Never       OPHTHALMIC EXAM:  Base Eye Exam     Visual Acuity (Snellen - Linear)       Right Left   Dist cc 20/40 20/25   Dist ph cc NI NI    Correction: Glasses         Tonometry (Tonopen, 12:42 PM)       Right Left   Pressure 17 14  Pupils       Dark Light Shape React APD   Right 3 2 Round Brisk None   Left 3 2 Round Brisk None         Visual Fields       Left Right    Full Full         Extraocular Movement       Right Left    Full, Ortho Full, Ortho         Neuro/Psych     Oriented x3: Yes   Mood/Affect: Normal         Dilation     Both eyes: 1.0% Mydriacyl, 2.5% Phenylephrine @ 12:39 PM           Slit Lamp and Fundus Exam     External Exam       Right Left   External Normal Normal         Slit Lamp Exam       Right Left   Lids/Lashes Dermatochalasis - upper lid, Telangiectasia Dermatochalasis - upper lid, mild Meibomian gland dysfunction   Conjunctiva/Sclera Temporal Pinguecula White and quiet   Cornea Arcus, 1+ Punctate epithelial erosions, mild tear film debris, fine endo pigment Arcus, 1-2+ inferior Punctate epithelial erosions, mild tear film debris, fine endo pigment   Anterior Chamber Deep and quiet, narrow temporal angle Deep and quiet   Iris Round and dilated Round and dilated, pigmented mass at 0200 and 0730 behind iris, anterior to IOL -- ?regression of 0200 lesion   Lens PC IOL in good position with open PC PC IOL in good position with open PC   Anterior Vitreous Vitreous syneresis Vitreous syneresis, Posterior vitreous detachment         Fundus Exam       Right Left   Disc 360 Peripapillary atrophy, Sharp rim, 2+ Pallor sharp rim, temporal PPA, mild Pallor   C/D Ratio 0.4 0.3   Macula Flat, Blunted foveal reflex, drusen, RPE mottling and clumping, +central IRF / cystic changes -- improved, no heme, early RPE atrophy Flat, Blunted foveal reflex, drusen, RPE mottling, clumping and  early atrophy, no heme or edema   Vessels attenuated, Tortuous attenuated, Tortuous   Periphery Attached, scattered Reticular degeneration, pigmented cystoid degeneration temporally, No heme Attached, scattered Reticular degeneration, No heme           Refraction     Wearing Rx       Sphere Cylinder Axis Add   Right -0.75 +2.25 175 +2.75   Left -0.25 +1.25 165 +2.75           IMAGING AND PROCEDURES  Imaging and Procedures for @TODAY @  OCT, Retina - OU - Both Eyes       Right Eye Quality was good. Central Foveal Thickness: 280. Progression has been stable. Findings include normal foveal contour, no IRF, no SRF, retinal drusen , intraretinal hyper-reflective material, epiretinal membrane, outer retinal atrophy (Stable improvement in central cystic changes, Partial PVD).   Left Eye Quality was good. Central Foveal Thickness: 304. Progression has been stable. Findings include normal foveal contour, no IRF, no SRF, retinal drusen , outer retinal atrophy (Trace ERM, Partial PVD).   Notes *Images captured and stored on drive  Diagnosis / Impression:  OD: Exudative ARMD - stable improvement in central cystic changes, Partial PVD OS: Nonexudative ARMD OS Partial PVD OU  Clinical management:  See below  Abbreviations: NFP - Normal foveal profile. CME - cystoid macular edema. PED - pigment  epithelial detachment. IRF - intraretinal fluid. SRF - subretinal fluid. EZ - ellipsoid zone. ERM - epiretinal membrane. ORA - outer retinal atrophy. ORT - outer retinal tubulation. SRHM - subretinal hyper-reflective material       Intravitreal Injection, Pharmacologic Agent - OD - Right Eye       Time Out 08/20/2022. 1:09 PM. Confirmed correct patient, procedure, site, and patient consented.   Anesthesia Topical anesthesia was used. Anesthetic medications included Lidocaine 2%, Proparacaine 0.5%.   Procedure Preparation included 5% betadine to ocular surface, eyelid speculum. A  (32g) needle was used.   Injection: 6 mg faricimab-svoa 6 MG/0.05ML   Route: Intravitreal, Site: Right Eye   NDC: O8010301, Lot: [id number], Expiration date: 06/21/2024, Waste: 0 mL   Post-op Post injection exam found visual acuity of at least counting fingers. The patient tolerated the procedure well. There were no complications. The patient received written and verbal post procedure care education. Post injection medications were not given.            ASSESSMENT/PLAN:    ICD-10-CM   1. Exudative age-related macular degeneration of right eye with active choroidal neovascularization (HCC)  H35.3211 OCT, Retina - OU - Both Eyes    Intravitreal Injection, Pharmacologic Agent - OD - Right Eye    faricimab-svoa (VABYSMO) 6mg /0.28mL intravitreal injection    2. 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 Long-term use of Plaquenil  Z79.899     5. Pseudophakia of both eyes  Z96.1     6. Intermediate stage nonexudative age-related macular degeneration of both eyes  H35.3132     7. Dry eyes  H04.123      1. Exudative age related macular degeneration, OD  - conversion from nonexudative ARMD noted on 9.23.22 visit  - s/p IVA OD #1 (09.23.22), #2 (10.21.22), #3 (11.18.22), #4 (12.30.22), #5 (02.13.23), #6 (03.24.23), #7 (05.08.23), #8 (06.09.23) -- IVA resistance  - s/p IVE OD #1 (07.24.23), #2 (08.25.23), #3 (09.29.23), #4 (10.27.23) -- IVE resistance  - s/p IVV OD #1 (12.01.23), #2 (01.05.34), #3 (02.02.24), #4 (03.01.24), #5 (03.29.24), #6 (04.26.24), #7 (05.24.24) - BCVA OD stable at 20/40  - OCT shows OD: stable improvement in central cystic changes, Partial PVD at 5 weeks - recommend IVV OD #8 today, 06.28.24 w/ f/u extended to 7 wks - pt wishes to be treated with IVV - RBA of procedure discussed, questions answered - Vabysmo informed consent obtained and signed, 12.01.23 - see procedure note - f/u  7 weeks -- DFE/OCT, possible injection   2. Age related macular degeneration, non-exudative OS  - intermediate stage-no change from previous visit - The incidence, anatomy, and pathology of dry AMD, risk of progression, and the AREDS and AREDS 2 study including smoking risks discussed with patient.   - recommend Amsler grid monitoring  3. Iris/ciliary body mass OS - pigmented lesion behind iris but anterior to PCIOL at 0730 and 0300 - 0730 lesion spans 0630 to 0830 behind dilated iris and larger than 0300 lesion  - patient asymptomatic - pt saw Dr. Haywood Lasso, [facility] on 11.01.22 who dx her with iris margin cyst  4. Plaquenil use for RA  - baseline testing done 05.20.20 -- started plaquenil early May 2020 f or RA - HVF 10-2 showed non-specific defects OU -- essentially normal baseline test - OCT shows no obvious plaquenil-related changes, but interval development of exudative ARMD  OD as above  - currently taking 200mg  / day per Dr. Clarise Cruz instructions --> 3.29 mg/kg body wt per day  - po prednisone, currently as needed which is about once a week - and is also now on 10mg  po MTX weekly + folic acid - the American Academy of Ophthalmology recommends dosing 5mg /kg per day to reduce risk of retinal toxicity  - recommend consideration of using alternate medication for RA especially given co-morbidity of age-related macular degeneration OU  - managed by Dr[facility] Kathi Ludwig at [facility]  5. Pseudophakia OU - s/p CE/IOL w/ ?canaloplasty OU by Dr[facility] Lavinia Sharps at [facility]  - doing well  - continue to monitor  6. History of Glaucoma   - not currently on gtts -- IOP 17,14 - ?underwent canaloplasty in conjunction with CEIOL at [facility] clinic   - referred to and now under the expert care of Dr. Alben Spittle -- initial Visit/consult 10.3.2019  7. Dry Eye Syndrome  - use AT's OU as needed  Ophthalmic Meds Ordered this visit:  Meds ordered this encounter  Medications    faricimab-svoa (VABYSMO) 6mg /0.15mL intravitreal injection     This document serves as a record of services personally performed by Karie Chimera, MD, PhD. It was created on their behalf by Berlin Hun COT, an ophthalmic technician. The creation of this record is the provider's dictation and/or activities during the visit.    Electronically signed by: Berlin Hun COT 6.26.24 1:15 PM  This document serves as a record of services personally performed by Karie Chimera, MD, PhD. It was created on their behalf by Glee Arvin. Manson Passey, OA an ophthalmic technician. The creation of this record is the provider's dictation and/or activities during the visit.    Electronically signed by: Glee Arvin. Manson Passey, OA 08/22/22 1:15 PM  Karie Chimera, M.D., Ph.D. Diseases & Surgery of the Retina and Vitreous [facility] 08/20/2022   I have reviewed the above documentation for accuracy and completeness, and I agree with the above. Karie Chimera, M.D., Ph.D. 08/22/22 1:17 PM   Abbreviations: M myopia (nearsighted); A astigmatism; H hyperopia (farsighted); P presbyopia; Mrx spectacle prescription;  CTL contact lenses; OD right eye; OS left eye; OU both eyes  XT exotropia; ET esotropia; PEK punctate epithelial keratitis; PEE punctate epithelial erosions; DES dry eye syndrome; MGD meibomian gland dysfunction; ATs artificial tears; PFAT's preservative free artificial tears; NSC nuclear sclerotic cataract; PSC posterior subcapsular cataract; ERM epi-retinal membrane; PVD posterior vitreous detachment; RD retinal detachment; DM diabetes mellitus; DR diabetic retinopathy; NPDR non-proliferative diabetic retinopathy; PDR proliferative diabetic retinopathy; CSME clinically significant macular edema; DME diabetic macular edema; dbh dot blot hemorrhages; CWS cotton wool spot; POAG primary open angle glaucoma; C/D cup-to-disc ratio; HVF humphrey visual field; GVF goldmann visual field;  OCT optical coherence tomography; IOP intraocular pressure; BRVO Branch retinal vein occlusion; CRVO central retinal vein occlusion; CRAO central retinal artery occlusion; BRAO branch retinal artery occlusion; RT retinal tear; SB scleral buckle; PPV pars plana vitrectomy; VH Vitreous hemorrhage; PRP panretinal laser photocoagulation; IVK intravitreal kenalog; VMT vitreomacular traction; MH Macular hole;  NVD neovascularization of the disc; NVE neovascularization elsewhere; AREDS age related eye disease study; ARMD age related macular degeneration; POAG primary open angle glaucoma; EBMD epithelial/anterior basement membrane dystrophy; ACIOL anterior chamber intraocular lens; IOL intraocular lens; PCIOL posterior chamber intraocular lens; Phaco/IOL phacoemulsification with intraocular lens placement; PRK photorefractive keratectomy; LASIK laser assisted in situ keratomileusis; HTN hypertension; DM diabetes mellitus; COPD chronic obstructive  pulmonary disease

## 2022-08-20 ENCOUNTER — Encounter (INDEPENDENT_AMBULATORY_CARE_PROVIDER_SITE_OTHER): Payer: Self-pay | Admitting: Ophthalmology

## 2022-08-20 ENCOUNTER — Ambulatory Visit (INDEPENDENT_AMBULATORY_CARE_PROVIDER_SITE_OTHER): Payer: Medicare HMO | Admitting: Ophthalmology

## 2022-08-20 DIAGNOSIS — H353211 Exudative age-related macular degeneration, right eye, with active choroidal neovascularization: Secondary | ICD-10-CM | POA: Diagnosis not present

## 2022-08-20 DIAGNOSIS — Z79899 Other long term (current) drug therapy: Secondary | ICD-10-CM

## 2022-08-20 DIAGNOSIS — H353122 Nonexudative age-related macular degeneration, left eye, intermediate dry stage: Secondary | ICD-10-CM | POA: Diagnosis not present

## 2022-08-20 DIAGNOSIS — D4989 Neoplasm of unspecified behavior of other specified sites: Secondary | ICD-10-CM | POA: Diagnosis not present

## 2022-08-20 DIAGNOSIS — H04123 Dry eye syndrome of bilateral lacrimal glands: Secondary | ICD-10-CM

## 2022-08-20 DIAGNOSIS — Z961 Presence of intraocular lens: Secondary | ICD-10-CM

## 2022-08-20 DIAGNOSIS — H353132 Nonexudative age-related macular degeneration, bilateral, intermediate dry stage: Secondary | ICD-10-CM

## 2022-08-20 MED ORDER — FARICIMAB-SVOA 6 MG/0.05ML IZ SOLN
6.0000 mg | INTRAVITREAL | Status: AC | PRN
Start: 2022-08-20 — End: 2022-08-20
  Administered 2022-08-20: 6 mg via INTRAVITREAL

## 2022-08-23 ENCOUNTER — Telehealth: Payer: Self-pay | Admitting: Family Medicine

## 2022-08-23 ENCOUNTER — Other Ambulatory Visit: Payer: Self-pay

## 2022-08-23 ENCOUNTER — Encounter: Payer: Self-pay | Admitting: Family Medicine

## 2022-08-23 ENCOUNTER — Encounter (HOSPITAL_BASED_OUTPATIENT_CLINIC_OR_DEPARTMENT_OTHER): Payer: Self-pay

## 2022-08-23 DIAGNOSIS — R197 Diarrhea, unspecified: Secondary | ICD-10-CM | POA: Diagnosis not present

## 2022-08-23 DIAGNOSIS — R109 Unspecified abdominal pain: Secondary | ICD-10-CM | POA: Insufficient documentation

## 2022-08-23 LAB — COMPREHENSIVE METABOLIC PANEL
AST: 51 U/L — ABNORMAL HIGH (ref 15–41)
Alkaline Phosphatase: 44 U/L (ref 38–126)
BUN: 16 mg/dL (ref 8–23)

## 2022-08-23 LAB — COMPREHENSIVE METABOLIC PANEL WITH GFR
ALT: 37 U/L (ref 0–44)
Albumin: 4.4 g/dL (ref 3.5–5.0)
Anion gap: 10 (ref 5–15)
CO2: 25 mmol/L (ref 22–32)
Calcium: 9.4 mg/dL (ref 8.9–10.3)
Chloride: 102 mmol/L (ref 98–111)
Creatinine, Ser: 0.83 mg/dL (ref 0.44–1.00)
GFR, Estimated: >60 mL/min (ref >60)
Glucose, Bld: 112 mg/dL — ABNORMAL HIGH (ref 70–99)
Potassium: 4.9 mmol/L (ref 3.5–5.1)
Sodium: 137 mmol/L (ref 135–145)
Total Bilirubin: 0.7 mg/dL (ref 0.3–1.2)
Total Protein: 6.8 g/dL (ref 6.5–8.1)

## 2022-08-23 LAB — LIPASE, BLOOD: Lipase: 34 U/L (ref 11–51)

## 2022-08-23 LAB — CBC
HCT: 40.8 % (ref 36.0–46.0)
Hemoglobin: 13.9 g/dL (ref 12.0–15.0)
MCH: 30.4 pg (ref 26.0–34.0)
MCHC: 34.1 g/dL (ref 30.0–36.0)
MCV: 89.3 fL (ref 80.0–100.0)
Platelets: 226 10*3/uL (ref 150–400)
RBC: 4.57 MIL/uL (ref 3.87–5.11)
RDW: 12.6 % (ref 11.5–15.5)
WBC: 15.2 10*3/uL — ABNORMAL HIGH (ref 4.0–10.5)
nRBC: 0 % (ref 0.0–0.2)

## 2022-08-23 NOTE — Telephone Encounter (Signed)
Telephone encounter already sent to provider for review. Please refer to phone note for updates

## 2022-08-23 NOTE — Telephone Encounter (Signed)
Patient's daughter called and reports her mother would like to be referred to [facility] now due to the  other place Dr. Dimple Casey office being booked out 6 months. She is requesting to see Dr. Lendon Colonel at [facility] located at [facility].

## 2022-08-23 NOTE — ED Triage Notes (Signed)
Patient here POV from Home.  Endorses ABD Pain (mostly Lower Across) that has been present for some time but began again today at 1500. Recently discharged from hospital 1.5 Weeks for IV Antibiotics for Diverticular Abscess.  No Fevers. Loose Stools. Cramping in nature. Mild nausea. No emesis.   NAD Noted during Triage. A&Ox4. GCS 15. Ambulatory.

## 2022-08-23 NOTE — Addendum Note (Signed)
Addended by: Filomena Jungling on: 08/23/2022 03:49 PM   Modules accepted: Orders

## 2022-08-23 NOTE — Telephone Encounter (Signed)
New referral placed.

## 2022-08-23 NOTE — Telephone Encounter (Signed)
Ok to place referral.

## 2022-08-24 ENCOUNTER — Emergency Department (HOSPITAL_BASED_OUTPATIENT_CLINIC_OR_DEPARTMENT_OTHER): Payer: Medicare HMO

## 2022-08-24 ENCOUNTER — Emergency Department (HOSPITAL_BASED_OUTPATIENT_CLINIC_OR_DEPARTMENT_OTHER)
Admission: EM | Admit: 2022-08-24 | Discharge: 2022-08-24 | Disposition: A | Discharge status: Home / Self Care | Payer: Medicare HMO | Attending: Emergency Medicine | Admitting: Emergency Medicine

## 2022-08-24 DIAGNOSIS — R197 Diarrhea, unspecified: Secondary | ICD-10-CM

## 2022-08-24 DIAGNOSIS — R109 Unspecified abdominal pain: Secondary | ICD-10-CM

## 2022-08-24 LAB — URINALYSIS, ROUTINE W REFLEX MICROSCOPIC
Bacteria, UA: NONE SEEN
Bilirubin Urine: NEGATIVE
Glucose, UA: NEGATIVE mg/dL
Hgb urine dipstick: NEGATIVE
Nitrite: NEGATIVE
Protein, ur: NEGATIVE mg/dL
Specific Gravity, Urine: 1.018 (ref 1.005–1.030)
pH: 6 (ref 5.0–8.0)

## 2022-08-24 MED ORDER — DICYCLOMINE HCL 20 MG PO TABS: 20.0000 mg | ORAL_TABLET | Freq: Two times a day (BID) | ORAL | 0 refills | Status: DC

## 2022-08-24 MED ORDER — MELOXICAM 7.5 MG PO TABS: 7.5000 mg | ORAL_TABLET | Freq: Every day | ORAL | 0 refills | Status: DC

## 2022-08-24 MED ORDER — IOHEXOL 300 MG/ML  SOLN
100.0000 mL | Freq: Once | INTRAMUSCULAR | Status: AC | PRN
  Administered 2022-08-24: 100 mL via INTRAVENOUS

## 2022-08-24 MED ORDER — ONDANSETRON HCL 4 MG/2ML IJ SOLN
4.0000 mg | Freq: Once | INTRAMUSCULAR | Status: AC
  Administered 2022-08-24: 4 mg via INTRAVENOUS
  Filled 2022-08-24: qty 2

## 2022-08-24 NOTE — ED Provider Notes (Signed)
[facility] AT [facility]  Provider Note  CSN: [id number] Arrival date & time: 08/23/22 2215  History Chief Complaint  Patient presents with   Abdominal Pain    Margaret [name] is a 89 y.o. female with history of diverticulosis recently admitted for diverticulitis and pelvic abscess, treated non-operatively, discharged on 6/11 with a 30 day course of Cipro/Flagyl reports increasing lower abdominal cramping pain, nausea without vomiting, chills without fever. She has had loose stools since being on the Abx.    Home Medications Prior to Admission medications   Medication Sig Start Date End Date Taking? Authorizing Provider  dicyclomine (BENTYL) 20 MG tablet Take 1 tablet (20 mg total) by mouth 2 (two) times daily. 08/24/22  Yes Pollyann Savoy, MD  meloxicam (MOBIC) 7.5 MG tablet Take 1 tablet (7.5 mg total) by mouth daily. 08/24/22  Yes Pollyann Savoy, MD  acetaminophen (TYLENOL) 325 MG tablet Take 2 tablets (650 mg total) by mouth every 6 (six) hours as needed for moderate pain or headache. 08/03/22   Rhetta Mura, MD  albuterol (VENTOLIN HFA) 108 (90 Base) MCG/ACT inhaler Inhale 2 puffs into the lungs every 6 (six) hours as needed for wheezing or shortness of breath. 06/16/22   [name], [name], DO  ciprofloxacin (CIPRO) 500 MG tablet Take 1 tablet (500 mg total) by mouth 2 (two) times daily for 28 days. 08/03/22 08/31/22  Rhetta Mura, MD  cycloSPORINE (RESTASIS) 0.05 % ophthalmic emulsion Place 1 drop into both eyes 2 (two) times daily. 06/05/21   [provider]  levothyroxine (SYNTHROID) 50 MCG tablet Take 1 tablet (50 mcg total) by mouth daily before breakfast. 06/17/22   [name], [name], DO  metroNIDAZOLE (FLAGYL) 500 MG tablet Take 1 tablet (500 mg total) by mouth 2 (two) times daily for 28 days. 08/03/22 08/31/22  Rhetta Mura, MD  montelukast (SINGULAIR) 10 MG tablet Take 1 tablet (10 mg total) by mouth at bedtime. 06/16/22    [name], [name], DO  Multiple Vitamins-Minerals (PRESERVISION AREDS 2) CAPS Take 1 capsule by mouth 2 (two) times daily.    [provider]  ondansetron (ZOFRAN) 4 MG tablet Take 1 tablet (4 mg total) by mouth every 6 (six) hours as needed for nausea. 08/03/22   Rhetta Mura, MD  polyvinyl alcohol (LIQUIFILM TEARS) 1.4 % ophthalmic solution Place 1 drop into both eyes as needed for dry eyes.    [provider]  pregabalin (LYRICA) 25 MG capsule Take 1 capsule (25 mg total) by mouth at bedtime. 08/11/22   [name], [name], DO     Allergies    Augmentin [amoxicillin-pot clavulanate] and Lactose intolerance (gi)   Review of Systems   Review of Systems Please see HPI for pertinent positives and negatives  Physical Exam BP 129/66 (BP Location: Right Arm)   Pulse 87   Temp 98.3 F (36.8 C) (Oral)   Resp 19   Ht 5' (1.524 m)   Wt 58.4 kg   SpO2 99%   BMI 25.14 kg/m   Physical Exam Vitals and nursing note reviewed.  Constitutional:      Appearance: Normal appearance.  HENT:     Head: Normocephalic and atraumatic.     Nose: Nose normal.     Mouth/Throat:     Mouth: Mucous membranes are moist.  Eyes:     Extraocular Movements: Extraocular movements intact.     Conjunctiva/sclera: Conjunctivae normal.  Cardiovascular:     Rate and Rhythm: Normal rate.  Pulmonary:     Effort: Pulmonary effort is normal.     Breath sounds: Normal breath sounds.  Abdominal:     General: Abdomen is flat.     Palpations: Abdomen is soft.     Tenderness: There is abdominal tenderness in the right lower quadrant and left lower quadrant. There is no guarding. Negative signs include Murphy's sign and McBurney's sign.  Musculoskeletal:        General: No swelling. Normal range of motion.     Cervical back: Neck supple.  Skin:    General: Skin is warm and dry.  Neurological:     General: No focal deficit present.     Mental Status: She is alert.  Psychiatric:        Mood and  Affect: Mood normal.     ED Results / Procedures / Treatments   EKG None  Procedures Procedures  Medications Ordered in the ED Medications  ondansetron (ZOFRAN) injection 4 mg (4 mg Intravenous Given 08/24/22 0314)  iohexol (OMNIPAQUE) 300 MG/ML solution 100 mL (100 mLs Intravenous Contrast Given 08/24/22 0327)    Initial Impression and Plan  Patient with known diverticular disease and recent abscess here with increasing pain. Abdomen is tender but no focal peritoneal signs. Labs done in triage show CBC with increased WBC from previous. CMP is unremarkable. UA pending. Send for CT. Zofran for nausea, she declines pain medication.   ED Course   Clinical Course as of 08/24/22 0419  Tue Aug 24, 2022  8119 UA is clear.  [CS]  [phone] I personally viewed the images from radiology studies and agree with radiologist interpretation:  CT is neg for acute process, no new infections, SBO and prior abscess is resolved. Patient reassured no surgical process. She is requesting medication for abdominal cramping which may be due to her diarrhea. Doubt this is C-diff with negative CT. Will give Rx for Bentyl. Patient also previously on steroids from rheum, but stopped due to infection, asking for NSAID rx to help with joint pain. Rx short course of Mobic. Recommend close outpatient PCP and general surgery follow up to ask if Abx need to be continued with her negative CT. RTED for any worsening pain, fever, vomiting or bloody stools.  [CS]    Clinical Course User Index [CS] Pollyann Savoy, MD     MDM Rules/Calculators/A&P Medical Decision Making Given presenting complaint, I considered that admission might be necessary. After review of results from ED lab and/or imaging studies, admission to the hospital is not indicated at this time.    Problems Addressed: Abdominal cramping: acute illness or injury Diarrhea, unspecified type: acute illness or injury  Amount and/or Complexity of Data  Reviewed Labs: ordered. Decision-making details documented in ED Course. Radiology: ordered and independent interpretation performed. Decision-making details documented in ED Course.  Risk Prescription drug management. Decision regarding hospitalization.     Final Clinical Impression(s) / ED Diagnoses Final diagnoses:  Abdominal cramping  Diarrhea, unspecified type    Rx / DC Orders ED Discharge Orders          Ordered    dicyclomine (BENTYL) 20 MG tablet  2 times daily        08/24/22 0418    meloxicam (MOBIC) 7.5 MG tablet  Daily        08/24/22 0418             Pollyann Savoy, MD 08/24/22 [phone]

## 2022-08-24 NOTE — Telephone Encounter (Signed)
Referral moved.  To Dr. Lendon Colonel

## 2022-09-09 ENCOUNTER — Encounter: Payer: Self-pay | Admitting: Family Medicine

## 2022-09-09 ENCOUNTER — Encounter: Payer: Medicare HMO | Admitting: Family Medicine

## 2022-09-09 NOTE — Progress Notes (Signed)
No show

## 2022-09-09 NOTE — Progress Notes (Deleted)
Margaret [name] , 1929/05/25, 89 y.o., female MRN: [id number] Patient Care Team    Relationship Specialty Notifications Start End  [name], DO PCP - General Family Medicine  03/08/19   Jodelle Red, MD PCP - Cardiology Cardiology  04/30/22   Rennis Chris, MD Consulting Physician Ophthalmology  03/29/18   Grant Fontana, MD Consulting Physician Ophthalmology  03/29/18   Jacqlyn Krauss, MD Referring Physician Dermatology  08/02/19   Rossie Muskrat, MD  Rheumatology  08/02/19     No chief complaint on file.    Subjective: Margaret [name] is a 89 y.o. Pt presents for an OV with complaints of *** of *** duration.  Associated symptoms include ***.  Pt has tried *** to ease their symptoms.  Bentyl, mobic TSH    07/23/2022   11:16 AM 06/16/2022   10:22 AM 10/24/2021    9:10 AM 12/09/2020    9:53 AM 10/22/2020    1:13 PM  Depression screen PHQ 2/9  Decreased Interest 0 0 0 0 0  Down, Depressed, Hopeless 0 0 0 0 0  PHQ - 2 Score 0 0 0 0 0  Altered sleeping 1      Tired, decreased energy 1      Change in appetite 0      Feeling bad or failure about yourself  0      Trouble concentrating 1      Moving slowly or fidgety/restless 0      Suicidal thoughts 0      PHQ-9 Score 3      Difficult doing work/chores Not difficult at all        Allergies  Allergen Reactions   Augmentin [Amoxicillin-Pot Clavulanate] Swelling and Rash    Swelling in face   Lactose Intolerance (Gi)    Social History   Social History Narrative   Marital status/children/pets: Widowed.  Moved to [place] from [place] area) 2019.  She shares a home with 1 of her daughters.  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   Cervical radiculopathy 05/16/2015   DDD C4-C5, C5-C6, and C6-C7.  Facet arthropathy throughout cervical spine.  Normal foraminal narrowing secondary to uncovertebral  arthropathy is seen bilaterally at C3-C4 and C5/C6   Chicken pox    DDD (degenerative disc disease), lumbar 04/26/2015   Mild levoscoliosis, right anterior listhesis of L4 and L5.  DDD at every level.  Facet arthropathy at multiple levels.   Deviated septum    Glaucoma    both eyes, mild opened angle   Heart murmur    History of colon polyps    History of fall    History of frequent urinary tract infections    klebs- pansensitive. c. 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    Pseudophakia of both eyes    PVD (peripheral vascular disease) (HCC)    Rheumatic fever    89 years old    Skin lesion 12/31/2021   Syncope 05/17/2017   Thyroid disease    Urinary incontinence    Vertigo    had been prescribed antivert   Vitamin D deficiency    Past Surgical History:  Procedure Laterality Date   CARPAL TUNNEL RELEASE Bilateral 2019   CATARACT EXTRACTION Bilateral 1997   COLONOSCOPY  2010   NASAL SEPTUM SURGERY  2007   WRIST FRACTURE SURGERY Right 1996   Family History  Problem Relation Age of Onset   Arthritis Mother    COPD Mother    Prostate cancer Father    Arthritis Father    Hearing loss Father    Heart disease Father    Heart attack Father    Stroke Maternal Grandmother    Liver  cancer Sister    Arthritis Brother    Prostate cancer Brother    COPD Brother    Heart disease Brother    Breast cancer Daughter    Blindness Maternal Grandfather    Stroke Maternal Grandfather    COPD Paternal Grandmother    Breast cancer Paternal Grandmother    Allergies as of 09/09/2022       Reactions   Augmentin [amoxicillin-pot Clavulanate] Swelling, Rash   Swelling in face   Lactose Intolerance (gi)         Medication List        Accurate as of September 09, 2022  7:44 AM. If you have any questions, ask your nurse or doctor.          acetaminophen 325 MG tablet Commonly known as: TYLENOL Take 2 tablets (650 mg total) by mouth every 6 (six) hours as needed for moderate pain or headache.   albuterol 108 (90 Base) MCG/ACT inhaler Commonly known as: VENTOLIN HFA Inhale 2 puffs into the lungs every 6 (six) hours as needed for wheezing or shortness of breath.   cycloSPORINE 0.05 % ophthalmic emulsion Commonly known as: RESTASIS Place 1 drop into both eyes 2 (two) times daily.   dicyclomine 20 MG tablet Commonly known as: BENTYL Take 1 tablet (20 mg total) by mouth 2 (two) times daily.   levothyroxine 50 MCG tablet Commonly known as: SYNTHROID Take 1 tablet (50 mcg total) by mouth daily before breakfast.   meloxicam 7.5 MG tablet Commonly known as: Mobic Take 1 tablet (7.5 mg total) by mouth daily.   montelukast 10 MG tablet Commonly known as: SINGULAIR Take 1 tablet (10 mg total) by mouth at bedtime.   ondansetron 4 MG tablet Commonly known as: ZOFRAN Take 1 tablet (4 mg total) by mouth every 6 (six) hours as needed for nausea.   polyvinyl alcohol 1.4 % ophthalmic solution Commonly known as: LIQUIFILM TEARS Place 1 drop into both eyes as needed for dry eyes.   pregabalin 25 MG capsule Commonly known as: Lyrica Take 1 capsule (25 mg total) by mouth at bedtime.   PreserVision AREDS 2 Caps Take 1 capsule by mouth 2 (two) times daily.  All  past medical history, surgical history, allergies, family history, immunizations andmedications were updated in the EMR today and reviewed under the history and medication portions of their EMR.     ROS Negative, with the exception of above mentioned in HPI   Objective:  There were no vitals taken for this visit. There is no height or weight on file to calculate BMI.  Physical Exam   No results found. No results found. No results found for this or any previous visit (from the past 24 hour(s)).  Assessment/Plan: Margaret [name] is a 89 y.o. female present for OV for  *** Reviewed expectations re: course of current medical issues. Discussed self-management of symptoms. Outlined signs and symptoms indicating need for more acute intervention. Patient verbalized understanding and all questions were answered. Patient received an After-Visit Summary.    No orders of the defined types were placed in this encounter.  No orders of the defined types were placed in this encounter.  Referral Orders  No referral(s) requested today     Note is dictated utilizing voice recognition software. Although note has been proof read prior to signing, occasional typographical errors still can be missed. If any questions arise, please do not hesitate to call for verification.   electronically signed by:  Felix Pacini, DO  [facility] [place]

## 2022-09-09 NOTE — Patient Instructions (Signed)
No follow-ups on file.        Great to see you today.  I have refilled the medication(s) we provide.   If labs were collected, we will inform you of lab results once received either by echart message or telephone call.   - echart message- for normal results that have been seen by the patient already.   - telephone call: abnormal results or if patient has not viewed results in their echart.  

## 2022-09-14 ENCOUNTER — Ambulatory Visit: Payer: Medicare HMO | Admitting: Family Medicine

## 2022-09-17 ENCOUNTER — Ambulatory Visit: Payer: Medicare HMO | Admitting: Family Medicine

## 2022-09-17 ENCOUNTER — Encounter: Payer: Self-pay | Admitting: Family Medicine

## 2022-09-17 VITALS — BP 124/74 | HR 77 | Temp 97.4°F | Wt 127.8 lb

## 2022-09-17 DIAGNOSIS — M0600 Rheumatoid arthritis without rheumatoid factor, unspecified site: Secondary | ICD-10-CM | POA: Diagnosis not present

## 2022-09-17 DIAGNOSIS — L989 Disorder of the skin and subcutaneous tissue, unspecified: Secondary | ICD-10-CM | POA: Diagnosis not present

## 2022-09-17 DIAGNOSIS — M359 Systemic involvement of connective tissue, unspecified: Secondary | ICD-10-CM | POA: Diagnosis not present

## 2022-09-17 DIAGNOSIS — R300 Dysuria: Secondary | ICD-10-CM

## 2022-09-17 DIAGNOSIS — M353 Polymyalgia rheumatica: Secondary | ICD-10-CM | POA: Diagnosis not present

## 2022-09-17 MED ORDER — MELOXICAM 15 MG PO TABS: 15.0000 mg | ORAL_TABLET | Freq: Every day | ORAL | 1 refills | Status: DC

## 2022-09-17 NOTE — Progress Notes (Signed)
Margaret [name] , [birth year], 89 y.o., female MRN: [id number] Patient Care Team    Relationship Specialty Notifications Start End  [name], DO PCP - General Family Medicine  03/08/19   Jodelle Red, MD PCP - Cardiology Cardiology  04/30/22   Rennis Chris, MD Consulting Physician Ophthalmology  03/29/18   Grant Fontana, MD Consulting Physician Ophthalmology  03/29/18   Jacqlyn Krauss, MD Referring Physician Dermatology  08/02/19   Rossie Muskrat, MD  Rheumatology  08/02/19     Chief Complaint  Patient presents with   Fatigue     Subjective: Margaret [name] is a 89 y.o. Pt presents for an OV with complaints of recent fatigue and polyarthralgia.  Patient recently has been in the hospital struggling with diverticulitis.  During that time inpatient team removed her steroid and encouraged her to talk to her rheumatology about the different medication.  Patient had been prescribed prednisone 5 mg daily for approximately 4 years.   Since that time patient reports she has been feeling very fatigued and having joint aches.  Emergency room doctor did start her back on Mobic 7.5 mg daily.  She states it is helpful, but she still in a great deal of pain.  She is scheduled with a new rheumatologist this Wednesday. Reviewed all recent labs she has had which were normal with the exception of mildly elevated white blood cells.  She has fully recovered from her diverticulitis.  She does endorse occasional episodes of diarrhea.    07/23/2022   11:16 AM 06/16/2022   10:22 AM 10/24/2021    9:10 AM 12/09/2020    9:53 AM 10/22/2020    1:13 PM  Depression screen PHQ 2/9  Decreased Interest 0 0 0 0 0  Down, Depressed, Hopeless 0 0 0 0 0  PHQ - 2 Score 0 0 0 0 0  Altered sleeping 1      Tired, decreased energy 1      Change in appetite 0      Feeling bad or failure about yourself  0      Trouble concentrating 1      Moving slowly or fidgety/restless 0      Suicidal thoughts 0       PHQ-9 Score 3      Difficult doing work/chores Not difficult at all        Allergies  Allergen Reactions   Augmentin [Amoxicillin-Pot Clavulanate] Swelling and Rash    Swelling in face   Lactose Intolerance (Gi)    Social History   Social History Narrative   Marital status/children/pets: Widowed.  Moved to [place] from [place] area) 2019.  She shares a home with 1 of her daughters.  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   Cervical radiculopathy 05/16/2015   DDD C4-C5, C5-C6, and C6-C7.  Facet arthropathy  throughout cervical spine.  Normal foraminal narrowing secondary to uncovertebral  arthropathy is seen bilaterally at C3-C4 and C5/C6   Chicken pox    DDD (degenerative disc disease), lumbar 04/26/2015   Mild levoscoliosis, right anterior listhesis of L4 and L5.  DDD at every level.  Facet arthropathy at multiple levels.   Deviated septum    Glaucoma    both eyes, mild opened angle   Heart murmur    History of colon polyps    History of fall    History of frequent urinary tract infections    klebs- pansensitive. c. 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    Pseudophakia of both  eyes    PVD (peripheral vascular disease) (HCC)    Rheumatic fever    89 years old    Skin lesion 12/31/2021   Syncope 05/17/2017   Thyroid disease    Urinary incontinence    Vertigo    had been prescribed antivert   Vitamin D deficiency    Past Surgical History:  Procedure Laterality Date   CARPAL TUNNEL RELEASE Bilateral 2019   CATARACT EXTRACTION Bilateral 1997   COLONOSCOPY  2010   NASAL SEPTUM SURGERY  2007   WRIST FRACTURE SURGERY Right 1996   Family History  Problem Relation Age of Onset   Arthritis Mother    COPD Mother    Prostate cancer Father    Arthritis Father    Hearing loss Father    Heart disease Father    Heart attack Father    Stroke Maternal Grandmother    Liver cancer Sister    Arthritis Brother    Prostate cancer Brother    COPD Brother    Heart disease Brother    Breast cancer Daughter    Blindness Maternal Grandfather    Stroke Maternal Grandfather    COPD Paternal Grandmother    Breast cancer Paternal Grandmother    Allergies as of 09/17/2022       Reactions   Augmentin [amoxicillin-pot Clavulanate] Swelling, Rash   Swelling in face   Lactose Intolerance (gi)         Medication List        Accurate as of September 17, 2022  3:39 PM. If you have any questions, ask your nurse or doctor.          acetaminophen 325 MG tablet Commonly known as: TYLENOL Take 2 tablets (650 mg total) by mouth every 6 (six) hours as needed for moderate pain or headache.   albuterol 108 (90 Base) MCG/ACT inhaler Commonly known as: VENTOLIN HFA Inhale 2 puffs into the lungs every 6 (six) hours as needed for wheezing or shortness of breath.   cycloSPORINE 0.05 % ophthalmic emulsion Commonly known as: RESTASIS Place 1 drop into both eyes 2 (two) times daily.   dicyclomine 20 MG tablet Commonly known as: BENTYL Take 1 tablet (20 mg total) by mouth 2 (two) times daily.   levothyroxine 50 MCG tablet Commonly known as: SYNTHROID Take 1 tablet (50 mcg  total) by mouth daily before breakfast.   meloxicam 15 MG  tablet Commonly known as: MOBIC Take 1 tablet (15 mg total) by mouth daily. What changed:  medication strength how much to take Changed by: Felix Pacini   montelukast 10 MG tablet Commonly known as: SINGULAIR Take 1 tablet (10 mg total) by mouth at bedtime.   polyvinyl alcohol 1.4 % ophthalmic solution Commonly known as: LIQUIFILM TEARS Place 1 drop into both eyes as needed for dry eyes.   pregabalin 25 MG capsule Commonly known as: Lyrica Take 1 capsule (25 mg total) by mouth at bedtime.   PreserVision AREDS 2 Caps Take 1 capsule by mouth 2 (two) times daily.        All past medical history, surgical history, allergies, family history, immunizations andmedications were updated in the EMR today and reviewed under the history and medication portions of their EMR.     ROS Negative, with the exception of above mentioned in HPI   Objective:  BP 124/74   Pulse 77   Temp (!) 97.4 F (36.3 C)   Wt 127 lb 12.8 oz (58 kg)   SpO2 97%   BMI 24.96 kg/m  Body mass index is 24.96 kg/m. Physical Exam Vitals and nursing note reviewed.  Constitutional:      General: She is not in acute distress.    Appearance: Normal appearance. She is normal weight. She is not ill-appearing or toxic-appearing.  HENT:     Head: Normocephalic and atraumatic.  Eyes:     General: No scleral icterus.       Right eye: No discharge.        Left eye: No discharge.     Extraocular Movements: Extraocular movements intact.     Conjunctiva/sclera: Conjunctivae normal.     Pupils: Pupils are equal, round, and reactive to light.  Abdominal:     Tenderness: There is no abdominal tenderness. There is no right CVA tenderness or left CVA tenderness.  Skin:    Findings: Lesion (5 mm stuck on scaly dark plaque, irritated) present. No rash.  Neurological:     Mental Status: She is alert and oriented to person, place, and time. Mental status is at  baseline.     Motor: No weakness.     Coordination: Coordination normal.     Gait: Gait normal.  Psychiatric:        Mood and Affect: Mood normal.        Behavior: Behavior normal.        Thought Content: Thought content normal.        Judgment: Judgment normal.     No results found. No results found. No results found for this or any previous visit (from the past 24 hour(s)).  Assessment/Plan: Margaret [name] is a 89 y.o. female present for OV for  Connective tissue disease (HCC)/RA/PMR Increase Mobic to 15 mg daily.  She we will schedule lab appointment only in 2 weeks to monitor kidney function after starting medication - Basic Metabolic Panel (BMET); Future  Skin lesion of back SK-irritated on back.  Reassured.  She has dermatology appointment in a couple weeks.  Dysuria Complains of mild dysuria of a few weeks duration. - Urinalysis w microscopic + reflex cultur  Reviewed expectations re: course of current medical issues. Discussed self-management of symptoms. Outlined signs and symptoms indicating need for more acute intervention. Patient verbalized understanding and all questions were answered. Patient received an After-Visit Summary.    Orders Placed This Encounter  Procedures   Basic Metabolic Panel (BMET)   Urinalysis w  microscopic + reflex cultur   Meds ordered this encounter  Medications   meloxicam (MOBIC) 15 MG tablet    Sig: Take 1 tablet (15 mg total) by mouth daily.    Dispense:  90 tablet    Refill:  1   Referral Orders  No referral(s) requested today     Note is dictated utilizing voice recognition software. Although note has been proof read prior to signing, occasional typographical errors still can be missed. If any questions arise, please do not hesitate to call for verification.   electronically signed by:  Felix Pacini, DO  [facility] [place]

## 2022-09-17 NOTE — Patient Instructions (Signed)
Return in about 2 weeks (around 10/01/2022) for lab appt (non-fasting).        Great to see you today.  I have refilled the medication(s) we provide.   If labs were collected or images ordered, we will inform you of  results once we have received them and reviewed. We will contact you either by echart message, or telephone call.  Please give ample time to the testing facility, and our office to run,  receive and review results. Please do not call inquiring of results, even if you can see them in your chart. We will contact you as soon as we are able. If it has been over 1 week since the test was completed, and you have not yet heard from [place], then please call us.    - echart message- for normal results that have been seen by the patient already.   - telephone call: abnormal results or if patient has not viewed results in their echart.  If a referral to a specialist was entered for you, please call us in 2 weeks if you have not heard from the specialist office to schedule.

## 2022-09-18 ENCOUNTER — Encounter: Payer: Self-pay | Admitting: Family Medicine

## 2022-09-20 ENCOUNTER — Telehealth: Payer: Self-pay | Admitting: Family Medicine

## 2022-09-20 MED ORDER — SULFAMETHOXAZOLE-TRIMETHOPRIM 800-160 MG PO TABS: 1.0000 | ORAL_TABLET | Freq: Two times a day (BID) | ORAL | 0 refills | Status: AC

## 2022-09-20 NOTE — Telephone Encounter (Signed)
Still awaiting complete resolution of her urine culture including sensitivities to know what antibiotic to place her on.  Once I get those results we will call her

## 2022-09-20 NOTE — Telephone Encounter (Signed)
Please call patient and inform her at 1:33 PM today her culture sensitivities resulted. I have called in a medication called Bactrim to her pharmacy.  This is taken twice a day for 5 days.

## 2022-09-20 NOTE — Telephone Encounter (Signed)
Spoke with patient regarding results/recommendations.  

## 2022-10-01 ENCOUNTER — Other Ambulatory Visit (INDEPENDENT_AMBULATORY_CARE_PROVIDER_SITE_OTHER): Payer: Medicare HMO

## 2022-10-01 DIAGNOSIS — M359 Systemic involvement of connective tissue, unspecified: Secondary | ICD-10-CM

## 2022-10-01 LAB — BASIC METABOLIC PANEL
BUN: 16 mg/dL (ref 6–23)
CO2: 27 mEq/L (ref 19–32)
Calcium: 9.5 mg/dL (ref 8.4–10.5)
Chloride: 99 mEq/L (ref 96–112)
Creatinine, Ser: 0.74 mg/dL (ref 0.40–1.20)
GFR: 70.87 mL/min (ref >60.00)
Glucose, Bld: 87 mg/dL (ref 70–99)
Potassium: 3.9 mEq/L (ref 3.5–5.1)
Sodium: 135 mEq/L (ref 135–145)

## 2022-10-01 NOTE — Progress Notes (Signed)
Pt came for labs only, tolerated draw well.   

## 2022-10-06 NOTE — Progress Notes (Signed)
This encounter was created in error - please disregard.

## 2022-10-06 NOTE — Progress Notes (Shared)
[facility] - Clinic Note  10/08/2022    CHIEF COMPLAINT Patient presents for Retina Follow Up   HISTORY OF PRESENT ILLNESS: Margaret [name] is a 89 y.o. female who presents to the clinic today for:   HPI     Retina Follow Up   Patient presents with  Wet AMD.  In right eye.  Severity is moderate.  Duration of 7 weeks.  Since onset it is stable.  I, the attending physician,  performed the HPI with the patient and updated documentation appropriately.        Comments   Patient states vision seems better OD.       Last edited by Rennis Chris, MD on 10/10/2022 11:16 PM.      Referring physician: [name], DO [address] [place],  [place] [id number]  HISTORICAL INFORMATION:  Selected notes from the MEDICAL RECORD NUMBER Self referral for macular degeneration LEE: 03.21.19 (Dr. Prentice Docker in [place], [place]) [BCVA: OD: 20/40 OS: 20/40] Ocular Hx-POAG, non-exu ARMD, DES, pseudo, YAG PMH-astham, arthritis,    CURRENT MEDICATIONS: Current Outpatient Medications (Ophthalmic Drugs)  Medication Sig   cycloSPORINE (RESTASIS) 0.05 % ophthalmic emulsion Place 1 drop into both eyes 2 (two) times daily.   polyvinyl alcohol (LIQUIFILM TEARS) 1.4 % ophthalmic solution Place 1 drop into both eyes as needed for dry eyes.   No current facility-administered medications for this visit. (Ophthalmic Drugs)   Current Outpatient Medications (Other)  Medication Sig   acetaminophen (TYLENOL) 325 MG tablet Take 2 tablets (650 mg total) by mouth every 6 (six) hours as needed for moderate pain or headache.   albuterol (VENTOLIN HFA) 108 (90 Base) MCG/ACT inhaler Inhale 2 puffs into the lungs every 6 (six) hours as needed for wheezing or shortness of breath.   levothyroxine (SYNTHROID) 50 MCG tablet Take 1 tablet (50 mcg total) by mouth daily before breakfast.   meloxicam (MOBIC) 15 MG tablet Take 1 tablet (15 mg total) by mouth daily.   montelukast (SINGULAIR) 10 MG tablet Take 1  tablet (10 mg total) by mouth at bedtime.   Multiple Vitamins-Minerals (PRESERVISION AREDS 2) CAPS Take 1 capsule by mouth 2 (two) times daily.   pregabalin (LYRICA) 25 MG capsule Take 1 capsule (25 mg total) by mouth at bedtime.   dicyclomine (BENTYL) 20 MG tablet Take 1 tablet (20 mg total) by mouth 2 (two) times daily. (Patient not taking: Reported on 09/17/2022)   No current facility-administered medications for this visit. (Other)   REVIEW OF SYSTEMS: ROS   Positive for: Musculoskeletal, Endocrine, Eyes Negative for: Constitutional, Gastrointestinal, Neurological, Skin, Genitourinary, HENT, Cardiovascular, Respiratory, Psychiatric, Allergic/Imm, Heme/Lymph Last edited by Annalee Genta D, COT on 10/08/2022 12:48 PM.      ALLERGIES Allergies  Allergen Reactions   Augmentin [Amoxicillin-Pot Clavulanate] Swelling and Rash    Swelling in face   Lactose Intolerance (Gi)    PAST MEDICAL HISTORY Past Medical History:  Diagnosis Date   Allergy    Arthritis of left knee    Asthma    Carotid artery stenosis 10/17/2012   stable report in 2018- "moderate on right, minimal on left"   Carpal tunnel syndrome 2018   left.   Cervical radiculopathy 05/16/2015   DDD C4-C5, C5-C6, and C6-C7.  Facet arthropathy throughout cervical spine.  Normal foraminal narrowing secondary to uncovertebral  arthropathy is seen bilaterally at C3-C4 and C5/C6   Chicken pox    DDD (degenerative disc disease), lumbar 04/26/2015   Mild  levoscoliosis, right anterior listhesis of L4 and L5.  DDD at every level.  Facet arthropathy at multiple levels.   Deviated septum    Glaucoma    both eyes, mild opened angle   Heart murmur    History of colon polyps    History of fall    History of frequent urinary tract infections    klebs- pansensitive. c. freundii - resitant to cefazolin and augementin   Hypothyroidism    Intra-abdominal abscess (HCC) 03/10/2020   Iron deficiency anemia    prior pcp told her to take Fe  325 QD   Macular degeneration    h/o retinal edema   Mitral valve prolapse 08/27/2011   Murmur, cardiac 10/17/2012   Osteoarthritis    Osteoporosis    Pneumonia    Positive TB test    In college    Pseudophakia of both eyes    PVD (peripheral vascular disease) (HCC)    Rheumatic fever    89 years old    Skin lesion 12/31/2021   Syncope 05/17/2017   Thyroid disease    Urinary incontinence    Vertigo    had been prescribed antivert   Vitamin D deficiency    Past Surgical History:  Procedure Laterality Date   CARPAL TUNNEL RELEASE Bilateral 2019   CATARACT EXTRACTION Bilateral 1997   COLONOSCOPY  2010   NASAL SEPTUM SURGERY  2007   WRIST FRACTURE SURGERY Right 1996   FAMILY HISTORY Family History  Problem Relation Age of Onset   Arthritis Mother    COPD Mother    Prostate cancer Father    Arthritis Father    Hearing loss Father    Heart disease Father    Heart attack Father    Stroke Maternal Grandmother    Liver cancer Sister    Arthritis Brother    Prostate cancer Brother    COPD Brother    Heart disease Brother    Breast cancer Daughter    Blindness Maternal Grandfather    Stroke Maternal Grandfather    COPD Paternal Grandmother    Breast cancer Paternal Grandmother    SOCIAL HISTORY Social History   Tobacco Use   Smoking status: Former    Current packs/day: 0.00    Average packs/day: 0.5 packs/day for 13.0 years (6.5 ttl pk-yrs)    Types: Cigarettes    Start date: 52    Quit date: 1965    Years since quitting: 59.6   Smokeless tobacco: Never  Vaping Use   Vaping status: Never Used  Substance Use Topics   Alcohol use: Not Currently    Alcohol/week: 5.0 standard drinks of alcohol    Types: 5 Standard drinks or equivalent per week   Drug use: Never       OPHTHALMIC EXAM:  Base Eye Exam     Visual Acuity (Snellen - Linear)       Right Left   Dist cc 20/40 20/25   Dist ph cc 20/30 -1 NI    Correction: Glasses         Tonometry  (Tonopen, 12:54 PM)       Right Left   Pressure 12 14         Pupils       Dark Light Shape React APD   Right 3 2 Round Brisk None   Left 3 2 Round Brisk None         Visual Fields (Counting fingers)       Left  Right    Full Full         Extraocular Movement       Right Left    Full, Ortho Full, Ortho         Neuro/Psych     Oriented x3: Yes   Mood/Affect: Normal         Dilation     Both eyes: 1.0% Mydriacyl, 2.5% Phenylephrine @ 12:55 PM           Slit Lamp and Fundus Exam     External Exam       Right Left   External Normal Normal         Slit Lamp Exam       Right Left   Lids/Lashes Dermatochalasis - upper lid, Telangiectasia Dermatochalasis - upper lid, mild Meibomian gland dysfunction   Conjunctiva/Sclera Temporal Pinguecula White and quiet   Cornea Arcus, 1+ Punctate epithelial erosions, mild tear film debris, fine endo pigment Arcus, 1-2+ inferior Punctate epithelial erosions, mild tear film debris, fine endo pigment   Anterior Chamber Deep and quiet, narrow temporal angle Deep and quiet   Iris Round and dilated Round and dilated, pigmented mass at 0200 and 0730 behind iris, anterior to IOL -- ?regression of 0200 lesion   Lens PC IOL in good position with open PC PC IOL in good position with open PC   Anterior Vitreous Vitreous syneresis Vitreous syneresis, Posterior vitreous detachment         Fundus Exam       Right Left   Disc 360 Peripapillary atrophy, Sharp rim, 2+ Pallor sharp rim, temporal PPA, mild Pallor   C/D Ratio 0.4 0.3   Macula Flat, Blunted foveal reflex, drusen, RPE mottling and clumping, +central IRF / cystic changes -- improved, no heme, early RPE atrophy Flat, Blunted foveal reflex, drusen, RPE mottling, clumping and early atrophy, no heme or edema   Vessels attenuated, Tortuous attenuated, Tortuous   Periphery Attached, scattered Reticular degeneration, pigmented cystoid degeneration temporally, No heme  Attached, scattered Reticular degeneration, No heme           Refraction     Wearing Rx       Sphere Cylinder Axis Add   Right -0.75 +2.25 175 +2.75   Left -0.25 +1.25 165 +2.75           IMAGING AND PROCEDURES  Imaging and Procedures for @TODAY @  OCT, Retina - OU - Both Eyes       Right Eye Quality was good. Central Foveal Thickness: 275. Progression has been stable. Findings include normal foveal contour, no IRF, no SRF, retinal drusen , intraretinal hyper-reflective material, epiretinal membrane, outer retinal atrophy (Stable improvement in central cystic changes, Partial PVD).   Left Eye Quality was good. Central Foveal Thickness: 301. Progression has been stable. Findings include normal foveal contour, no IRF, no SRF, retinal drusen , pigment epithelial detachment, outer retinal atrophy (Trace ERM, Partial PVD).   Notes *Images captured and stored on drive  Diagnosis / Impression:  OD: Exudative ARMD - stable improvement in central cystic changes, Partial PVD OS: Nonexudative ARMD OS Partial PVD OU  Clinical management:  See below  Abbreviations: NFP - Normal foveal profile. CME - cystoid macular edema. PED - pigment epithelial detachment. IRF - intraretinal fluid. SRF - subretinal fluid. EZ - ellipsoid zone. ERM - epiretinal membrane. ORA - outer retinal atrophy. ORT - outer retinal tubulation. SRHM - subretinal hyper-reflective material       Intravitreal Injection, Pharmacologic  Agent - OD - Right Eye       Time Out 10/08/2022. 1:26 PM. Confirmed correct patient, procedure, site, and patient consented.   Anesthesia Topical anesthesia was used. Anesthetic medications included Lidocaine 2%, Proparacaine 0.5%.   Procedure Preparation included 5% betadine to ocular surface, eyelid speculum. A (32g) needle was used.   Injection: 6 mg faricimab-svoa 6 MG/0.05ML   Route: Intravitreal, Site: Right Eye   NDC: O8010301, Lot: [id number], Expiration date:  10/22/2024, Waste: 0 mL   Post-op Post injection exam found visual acuity of at least counting fingers. The patient tolerated the procedure well. There were no complications. The patient received written and verbal post procedure care education. Post injection medications were not given.            ASSESSMENT/PLAN:    ICD-10-CM   1. Exudative age-related macular degeneration of right eye with active choroidal neovascularization (HCC)  H35.3211 OCT, Retina - OU - Both Eyes    Intravitreal Injection, Pharmacologic Agent - OD - Right Eye    faricimab-svoa (VABYSMO) 6mg /0.92mL intravitreal injection    2. 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 Long-term use of Plaquenil  Z79.899     5. Pseudophakia of both eyes  Z96.1     6. Intermediate stage nonexudative age-related macular degeneration of both eyes  H35.3132     7. Dry eyes  H04.123      1. Exudative age related macular degeneration, OD  - conversion from nonexudative ARMD noted on 9.23.22 visit  - s/p IVA OD #1 (09.23.22), #2 (10.21.22), #3 (11.18.22), #4 (12.30.22), #5 (02.13.23), #6 (03.24.23), #7 (05.08.23), #8 (06.09.23) -- IVA resistance  - s/p IVE OD #1 (07.24.23), #2 (08.25.23), #3 (09.29.23), #4 (10.27.23) -- IVE resistance  - s/p IVV OD #1 (12.01.23), #2 (01.05.34), #3 (02.02.24), #4 (03.01.24), #5 (03.29.24), #6 (04.26.24), #7 (05.24.24), #8 (06.28.24) - BCVA OD 20/30 from 20/40  - OCT shows OD: stable improvement in central cystic changes, Partial PVD at 7 weeks - recommend IVV OD #9 today, 08.16.24 w/ f/u extended to 9 wks - pt wishes to be treated with IVV - RBA of procedure discussed, questions answered - Vabysmo informed consent obtained and signed, 12.01.23 - see procedure note - f/u 9 weeks -- DFE/OCT, possible injection   2. Age related macular degeneration, non-exudative OS  - intermediate stage-no change from previous  visit - The incidence, anatomy, and pathology of dry AMD, risk of progression, and the AREDS and AREDS 2 study including smoking risks discussed with patient.   - recommend Amsler grid monitoring  3. Iris/ciliary body mass OS - pigmented lesion behind iris but anterior to PCIOL at 0730 and 0300 - 0730 lesion spans 0630 to 0830 behind dilated iris and larger than 0300 lesion  - patient asymptomatic - pt saw Dr. Haywood Lasso, [facility] on 11.01.22 who dx her with iris margin cyst  4. Plaquenil use for RA  - baseline testing done 05.20.20 -- started plaquenil early May 2020 for RA - HVF 10-2 showed non-specific defects OU -- essentially normal baseline test - OCT shows no obvious plaquenil-related changes, but interval development of exudative ARMD OD as above - currently taking 200mg  / day per Dr. Clarise Cruz instructions --> 3.29 mg/kg body wt per day  - po prednisone, currently as needed which is about once a week - and is also now on 10mg  po  MTX weekly + folic acid - the American Academy of Ophthalmology recommends dosing 5mg /kg per day to reduce risk of retinal toxicity  - recommend consideration of using alternate medication for RA especially given co-morbidity of age-related macular degeneration OU  - managed by Dr. Kathi Ludwig at [facility]  5. Pseudophakia OU - s/p CE/IOL w/ ?canaloplasty OU by Dr. Lavinia Sharps at [facility]  - doing well  - continue to monitor  6. History of Glaucoma   - not currently on gtts -- IOP 12,14 - ?underwent canaloplasty in conjunction with CEIOL at [facility]   - referred to and now under the expert care of Dr. Alben Spittle -- initial Visit/consult 10.3.2019  7. Dry Eye Syndrome  - use AT's OU as needed  Ophthalmic Meds Ordered this visit:  Meds ordered this encounter  Medications   faricimab-svoa (VABYSMO) 6mg /0.7mL intravitreal injection     This document serves as a record of services personally performed by Karie Chimera, MD, PhD. It was created on their behalf by Glee Arvin. Manson Passey, OA an ophthalmic technician. The creation of this record is the provider's dictation and/or activities during the visit.    Electronically signed by: Glee Arvin. Manson Passey, OA 10/10/22 11:21 PM  Karie Chimera, M.D., Ph.D. Diseases & Surgery of the Retina and Vitreous [facility] 10/08/2022   I have reviewed the above documentation for accuracy and completeness, and I agree with the above. Karie Chimera, M.D., Ph.D. 10/10/22 11:21 PM   Abbreviations: M myopia (nearsighted); A astigmatism; H hyperopia (farsighted); P presbyopia; Mrx spectacle prescription;  CTL contact lenses; OD right eye; OS left eye; OU both eyes  XT exotropia; ET esotropia; PEK punctate epithelial keratitis; PEE punctate epithelial erosions; DES dry eye syndrome; MGD meibomian gland dysfunction; ATs artificial tears; PFAT's preservative free artificial tears; NSC nuclear sclerotic cataract; PSC posterior subcapsular cataract; ERM epi-retinal membrane; PVD posterior vitreous detachment; RD retinal detachment; DM diabetes mellitus; DR diabetic retinopathy; NPDR non-proliferative diabetic retinopathy; PDR proliferative diabetic retinopathy; CSME clinically significant macular edema; DME diabetic macular edema; dbh dot blot hemorrhages; CWS cotton wool spot; POAG primary open angle glaucoma; C/D cup-to-disc ratio; HVF humphrey visual field; GVF goldmann visual field; OCT optical coherence tomography; IOP intraocular pressure; BRVO Branch retinal vein occlusion; CRVO central retinal vein occlusion; CRAO central retinal artery occlusion; BRAO branch retinal artery occlusion; RT retinal tear; SB scleral buckle; PPV pars plana vitrectomy; VH Vitreous hemorrhage; PRP panretinal laser photocoagulation; IVK intravitreal kenalog; VMT vitreomacular traction; MH Macular hole;  NVD neovascularization of the disc; NVE neovascularization elsewhere; AREDS age  related eye disease study; ARMD age related macular degeneration; POAG primary open angle glaucoma; EBMD epithelial/anterior basement membrane dystrophy; ACIOL anterior chamber intraocular lens; IOL intraocular lens; PCIOL posterior chamber intraocular lens; Phaco/IOL phacoemulsification with intraocular lens placement; PRK photorefractive keratectomy; LASIK laser assisted in situ keratomileusis; HTN hypertension; DM diabetes mellitus; COPD chronic obstructive pulmonary disease

## 2022-10-08 ENCOUNTER — Encounter (INDEPENDENT_AMBULATORY_CARE_PROVIDER_SITE_OTHER): Payer: Self-pay | Admitting: Ophthalmology

## 2022-10-08 ENCOUNTER — Ambulatory Visit (INDEPENDENT_AMBULATORY_CARE_PROVIDER_SITE_OTHER): Payer: Medicare HMO | Admitting: Ophthalmology

## 2022-10-08 DIAGNOSIS — H353211 Exudative age-related macular degeneration, right eye, with active choroidal neovascularization: Secondary | ICD-10-CM | POA: Diagnosis not present

## 2022-10-08 DIAGNOSIS — Z79899 Other long term (current) drug therapy: Secondary | ICD-10-CM

## 2022-10-08 DIAGNOSIS — H04123 Dry eye syndrome of bilateral lacrimal glands: Secondary | ICD-10-CM | POA: Diagnosis not present

## 2022-10-08 DIAGNOSIS — H353122 Nonexudative age-related macular degeneration, left eye, intermediate dry stage: Secondary | ICD-10-CM | POA: Diagnosis not present

## 2022-10-08 DIAGNOSIS — H353132 Nonexudative age-related macular degeneration, bilateral, intermediate dry stage: Secondary | ICD-10-CM

## 2022-10-08 DIAGNOSIS — H401131 Primary open-angle glaucoma, bilateral, mild stage: Secondary | ICD-10-CM

## 2022-10-08 DIAGNOSIS — Z961 Presence of intraocular lens: Secondary | ICD-10-CM

## 2022-10-08 DIAGNOSIS — D4989 Neoplasm of unspecified behavior of other specified sites: Secondary | ICD-10-CM

## 2022-10-10 ENCOUNTER — Encounter (INDEPENDENT_AMBULATORY_CARE_PROVIDER_SITE_OTHER): Payer: Self-pay | Admitting: Ophthalmology

## 2022-10-10 MED ORDER — FARICIMAB-SVOA 6 MG/0.05ML IZ SOLN
6.0000 mg | INTRAVITREAL | Status: AC | PRN
Start: 2022-10-10 — End: 2022-10-10
  Administered 2022-10-10: 6 mg via INTRAVITREAL

## 2022-10-18 IMAGING — DX DG CHEST 2V
2 series · 2 of 2 positions shown · non-contrast
Comparison: None.

CLINICAL DATA: Cough and shortness of breath.

EXAM:
CHEST - 2 VIEW

[chest pa]
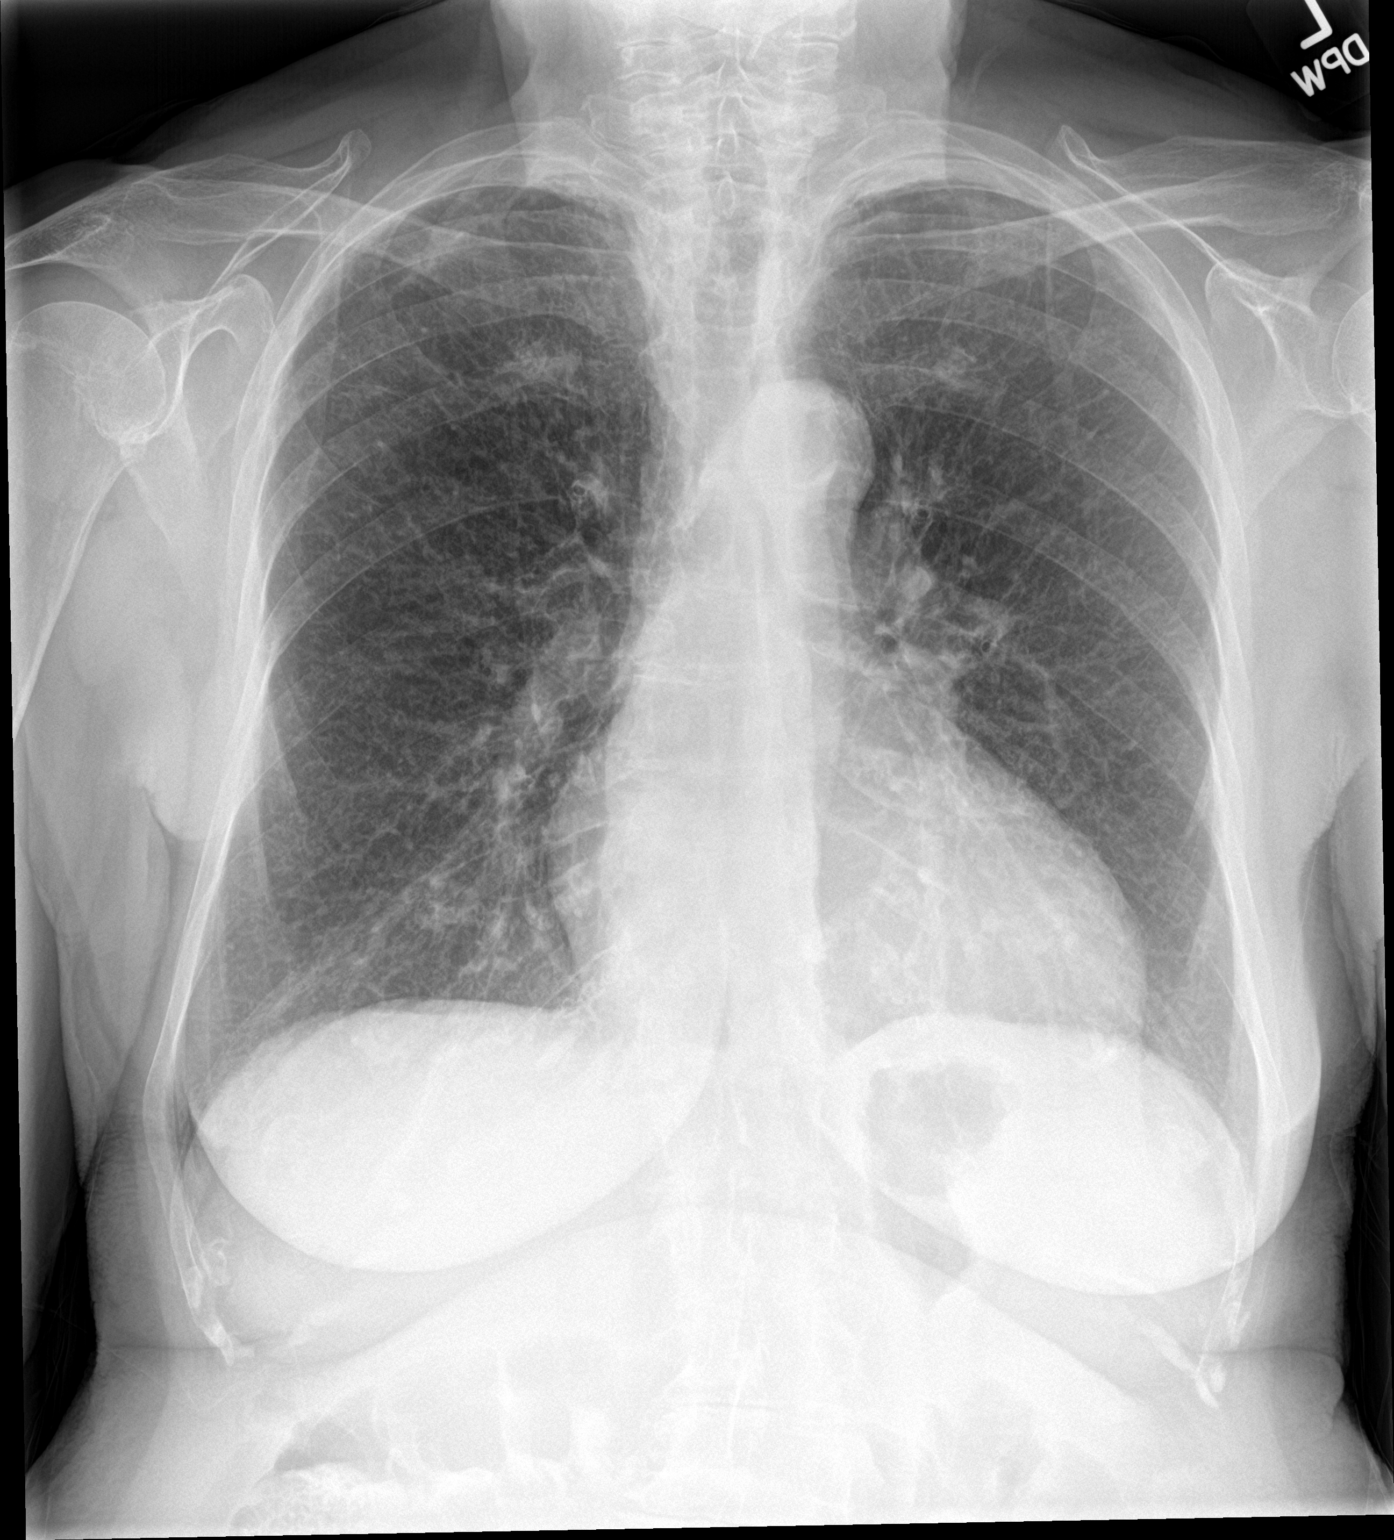

[chest lat]
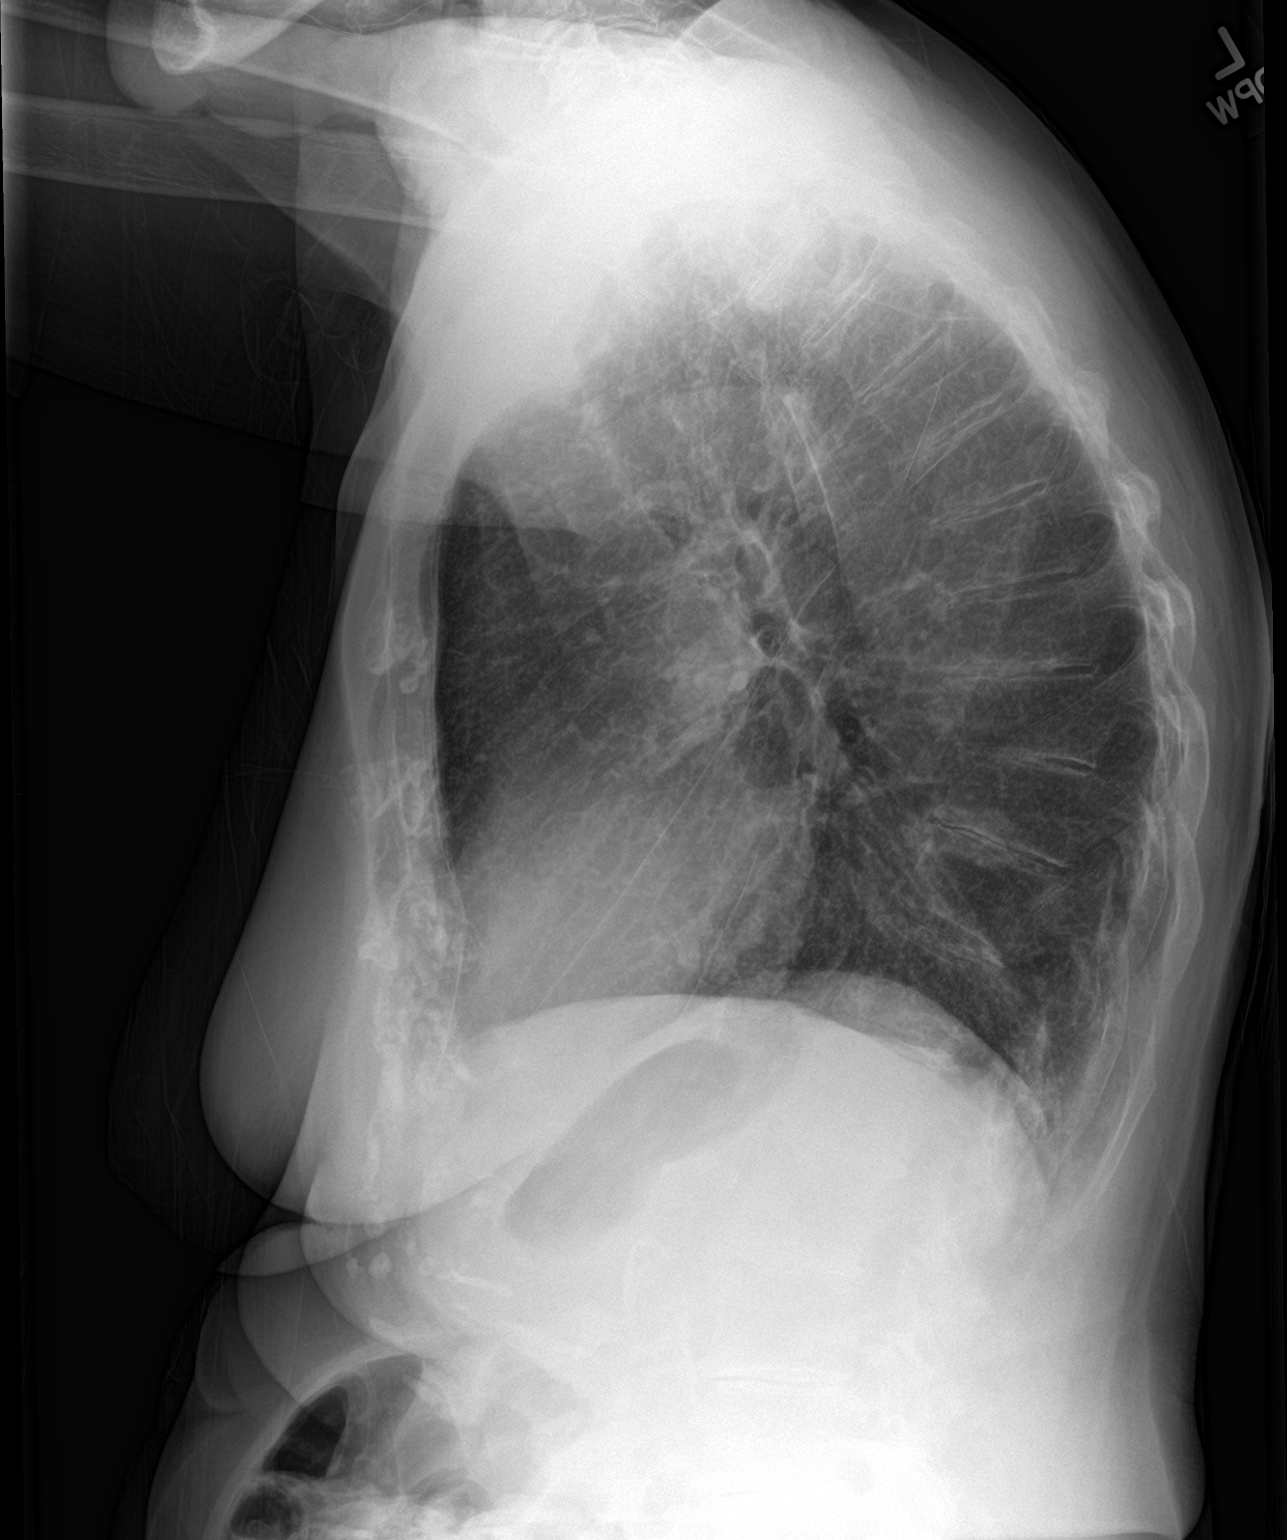

[2 of 2 positions shown; findings below may reference images not displayed]

FINDINGS: Mild to moderate severity diffuse chronic appearing increased
interstitial lung markings are seen. Very mild areas of focal
scarring, atelectasis and/or early infiltrate are seen within the
right apex and medial aspects of the bilateral upper lobes. There is
no evidence of a pleural effusion or pneumothorax. The heart size
and mediastinal contours are within normal limits. There is mild
calcification of the aortic arch. Degenerative changes seen
throughout the thoracic spine.
IMPRESSION: Chronic appearing increased interstitial lung markings with mild
areas of bilateral upper lobe scarring, atelectasis and/or early
infiltrate.

## 2022-10-19 ENCOUNTER — Ambulatory Visit (HOSPITAL_BASED_OUTPATIENT_CLINIC_OR_DEPARTMENT_OTHER): Payer: Medicare HMO | Admitting: Cardiology

## 2022-10-19 ENCOUNTER — Encounter (HOSPITAL_BASED_OUTPATIENT_CLINIC_OR_DEPARTMENT_OTHER): Payer: Self-pay | Admitting: Cardiology

## 2022-10-19 VITALS — BP 126/60 | HR 72 | Ht 60.0 in | Wt 126.0 lb

## 2022-10-19 DIAGNOSIS — R011 Cardiac murmur, unspecified: Secondary | ICD-10-CM

## 2022-10-19 DIAGNOSIS — I340 Nonrheumatic mitral (valve) insufficiency: Secondary | ICD-10-CM

## 2022-10-19 DIAGNOSIS — Z7189 Other specified counseling: Secondary | ICD-10-CM

## 2022-10-19 DIAGNOSIS — I350 Nonrheumatic aortic (valve) stenosis: Secondary | ICD-10-CM

## 2022-10-19 NOTE — Progress Notes (Signed)
Cardiology Office Note:  .    Date:  10/19/2022  ID:  Margaret [name], DOB [birth year], MRN [id number] PCP: [name], DO  [facility] Providers Cardiologist:  Jodelle Red, MD     History of Present Illness: .    Margaret [name] is a 89 y.o. female with a hx of PMR/RA who is seen for follow-up today. I initially met her 04/06/2022 as a new consult at the request of [name], [name], DO for the evaluation and management of aortic stenosis.   Reviewed note from Dr. Claiborne Billings from 12/31/21. Noted a murmur at that visit, echo done, showed moderate to severe aortic stenosis.   At her initial visit 03/2022, we reviewed her echo results in detail. Noted occasional dull feeling in her chest when she was walking uphill, but it did not stop her. She has a long history of fainting with triggers, ever since childhood. Has fainted less as an adult, last episode July 2023 after cutting her leg on a wall and seeing the blood. Had intermittent LE edema in the past, they were cautious about what socks she wears. She complained of rare palpitations and restless leg syndrome at night. Spent extensive time counseling on symptoms that need to be relayed to me as well as red flag symptoms that need immediate medical attention. Planned to follow every 6 mos for symptoms, with echo every 6 mos-55yr based on symptoms.  On 07/28/2022 she had been admitted to the hospital with diverticulitis of small intestine, with abscess without bleeding. Treated nonoperatively and discharged 08/03/22 with 30 day course of Cipro/Flagyl. She represented to the ED 08/24/2022 with worsening abdominal pain. Discharged with dicyclomine and meloxicam.  Today, she is accompanied by her daughter. Generally she appears well. Once in a while she will notice that her heart "does its thing" which has been ongoing for years. When going uphill, she may feel out of breath and sometimes stops to rest.  Also complains of intermittent swelling  in her hands which she mostly attributes to her arthritis.   She denies any lightheadedness, headaches, syncope, orthopnea, or PND.  ROS:  Please see the history of present illness. ROS otherwise negative except as noted.  (+) Occasional chest discomfort (+) Exertional shortness of breath (+) Edema of hands bilaterally  Studies Reviewed: Marland Kitchen         Physical Exam:    VS:  BP 126/60   Pulse 72   Ht 5' (1.524 m)   Wt 126 lb (57.2 kg)   BMI 24.61 kg/m    Wt Readings from Last 3 Encounters:  10/19/22 126 lb (57.2 kg)  09/17/22 127 lb 12.8 oz (58 kg)  08/23/22 128 lb 12 oz (58.4 kg)    GEN: Well nourished, well developed in no acute distress HEENT: Normal, moist mucous membranes NECK: No JVD CARDIAC: regular rhythm, normal S1 and S2, no rubs or gallops. 3/6 mid peaking harsh systolic ejection murmur at USB, 2/6 holosystolic murmur at LSB. VASCULAR: Radial and DP pulses 2+ bilaterally. No carotid bruits RESPIRATORY:  Clear to auscultation without rales, wheezing or rhonchi  ABDOMEN: Soft, non-tender, non-distended MUSCULOSKELETAL:  Ambulates independently SKIN: Warm and dry, no edema NEUROLOGIC:  Alert and oriented x 3. No focal neuro deficits noted. PSYCHIATRIC:  Normal affect   ASSESSMENT AND PLAN: .    Aortic stenosis -remains active with minimal symptoms only at peak activity -spent extensive time counseling on symptoms that need to be relayed to me as well as red  flag symptoms that need immediate medical attention -will follow every 6 mos for symptoms, with echo every 6 mos-36yr based on symptoms. Repeat echo in 6 mos  Murmur -both AS and mild MR murmurs   Elevated blood pressure reading without diagnosis of hypertension -normalized   Cardiac risk counseling and prevention recommendations: -recommend heart healthy/Mediterranean diet, with whole grains, fruits, vegetable, fish, lean meats, nuts, and olive oil. Limit salt. -recommend moderate walking, 3-5 times/week for  30-50 minutes each session. Aim for at least 150 minutes.week. Goal should be pace of 3 miles/hours, or walking 1.5 miles in 30 minutes -recommend avoidance of tobacco products. Avoid excess alcohol.  Dispo: Follow-up in 6 months, or sooner as needed.  I,Mathew Stumpf,acting as a Neurosurgeon for [employer], MD.,have documented all relevant documentation on the behalf of Jodelle Red, MD,as directed by  Jodelle Red, MD while in the presence of Jodelle Red, MD.  I, Jodelle Red, MD, have reviewed all documentation for this visit. The documentation on 10/19/22 for the exam, diagnosis, procedures, and orders are all accurate and complete.   Signed, Jodelle Red, MD

## 2022-10-19 NOTE — Patient Instructions (Addendum)
Medication Instructions:  Your physician recommends that you continue on your current medications as directed. Please refer to the Current Medication list given to you today.  *If you need a refill on your cardiac medications before your next appointment, please call your pharmacy*  Lab Work: NONE  Testing/Procedures: Your physician has requested that you have an echocardiogram. Echocardiography is a painless test that uses sound waves to create images of your heart. It provides your doctor with information about the size and shape of your heart and how well your heart's chambers and valves are working. This procedure takes approximately one hour. There are no restrictions for this procedure. Please do NOT wear cologne, perfume, aftershave, or lotions (deodorant is allowed). Please arrive 15 minutes prior to your appointment time. IN 6 MONTHS ABOUT A WEEK PRIOR TO FOLLOW UP   Follow-Up: At [facility], you and your health needs are our priority.  As part of our continuing mission to provide you with exceptional heart care, we have created designated Provider Care Teams.  These Care Teams include your primary Cardiologist (physician) and Advanced Practice Providers (APPs -  Physician Assistants and Nurse Practitioners) who all work together to provide you with the care you need, when you need it.  We recommend signing up for the patient portal called "MyChart".  Sign up information is provided on this After Visit Summary.  MyChart is used to connect with patients for Virtual Visits (Telemedicine).  Patients are able to view lab/test results, encounter notes, upcoming appointments, etc.  Non-urgent messages can be sent to your provider as well.   To learn more about what you can do with MyChart, go to [email or web address].    Your next appointment:   After ECHO in 6 month(s)  The format for your next appointment:   In Person  Provider:   Jodelle Red, MD or Ronn Melena NP

## 2022-10-25 ENCOUNTER — Encounter: Payer: Self-pay | Admitting: Family Medicine

## 2022-10-28 ENCOUNTER — Ambulatory Visit (INDEPENDENT_AMBULATORY_CARE_PROVIDER_SITE_OTHER): Payer: Medicare HMO

## 2022-10-28 DIAGNOSIS — M810 Age-related osteoporosis without current pathological fracture: Secondary | ICD-10-CM

## 2022-10-28 MED ORDER — DENOSUMAB 60 MG/ML ~~LOC~~ SOSY
60.0000 mg | PREFILLED_SYRINGE | Freq: Once | SUBCUTANEOUS | Status: AC
Start: 2022-10-28 — End: 2022-10-28
  Administered 2022-10-28: 60 mg via SUBCUTANEOUS

## 2022-10-28 NOTE — Progress Notes (Signed)
Pt in for Prolia injection per Dr Claiborne Billings.  Injection tolerated well.  Pt advised to schedule for next dose in March

## 2022-11-24 IMAGING — CT CT ABD-PELV W/ CM
2 of 5 series · 15 of 46 positions shown, 17 images · IV contrast (omnipaque)
Comparison: 03/10/2020

CLINICAL DATA: Intra-abdominal abscess.  Lower abdominal pain.

EXAM:
CT ABDOMEN AND PELVIS WITH CONTRAST
TECHNIQUE: Multidetector CT imaging of the abdomen and pelvis was performed
using the standard protocol following bolus administration of
intravenous contrast.
CONTRAST:  100mL OMNIPAQUE IOHEXOL 300 MG/ML  SOLN

[Series 2: axial st · axial · 0.74mm/px · z∈[-616,-291]mm · 12 of 77 slices shown, 14 images]
[im 6/77  soft-tissue]
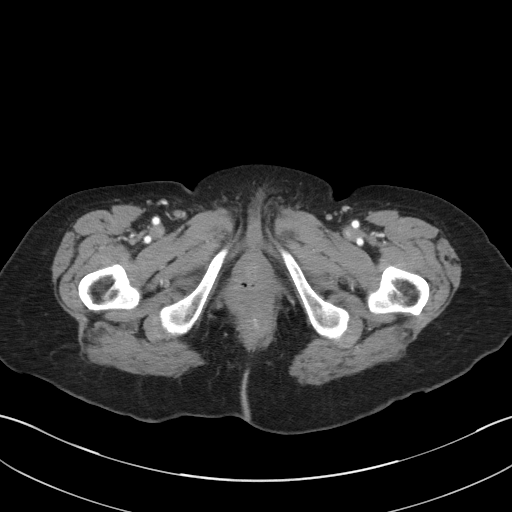
[im 6/77  bone]
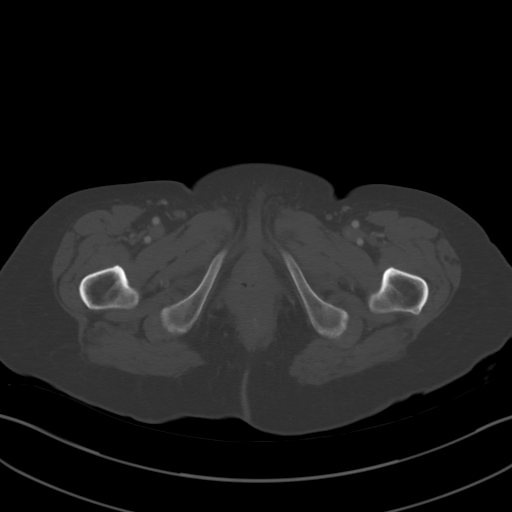
[im 11/77  soft-tissue]
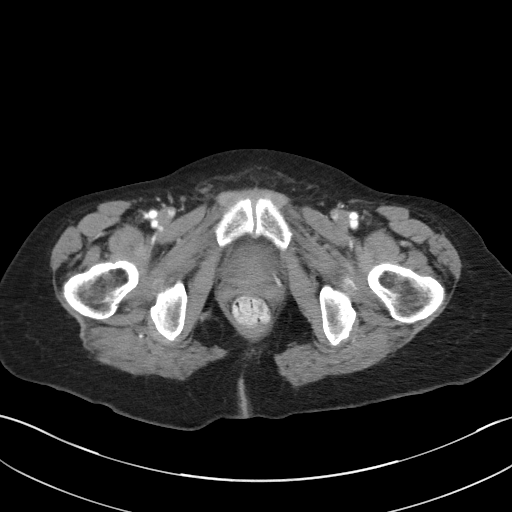
[im 16/77  soft-tissue]
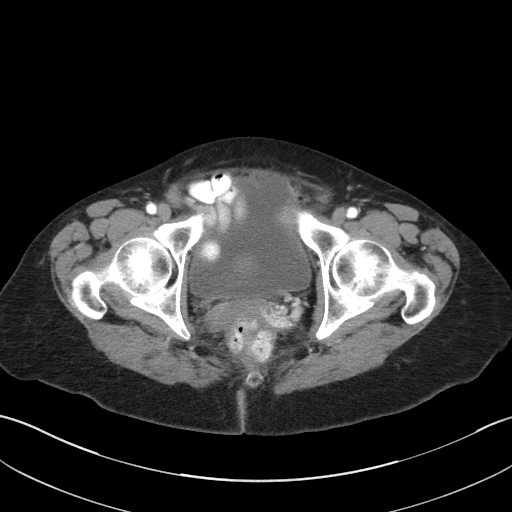
[im 26/77  soft-tissue]
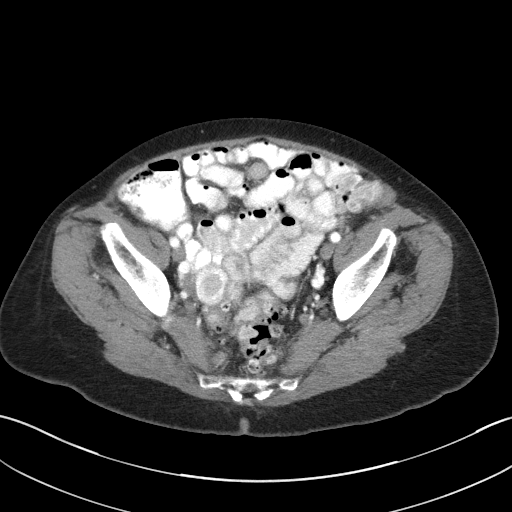
[im 31/77  soft-tissue]
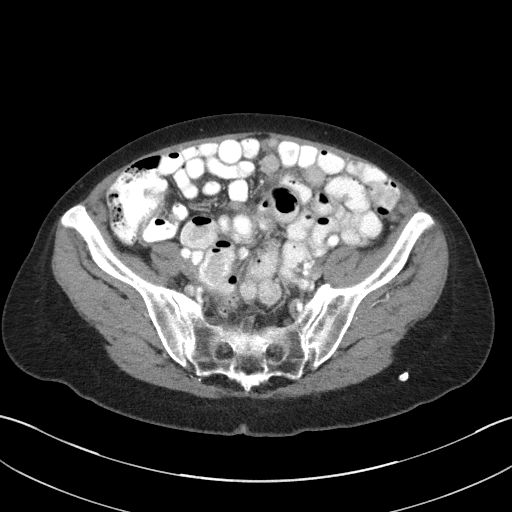
[im 36/77  soft-tissue]
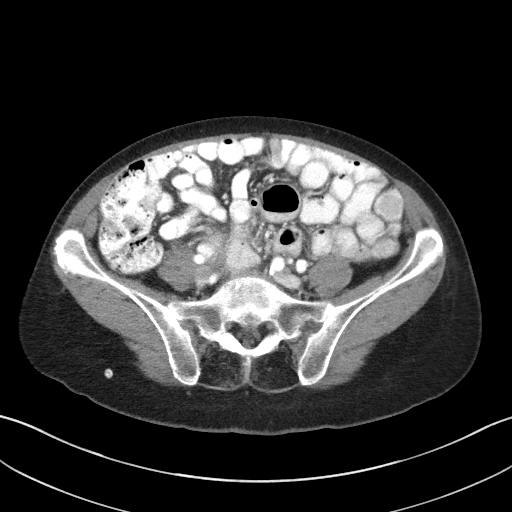
[im 41/77  soft-tissue]
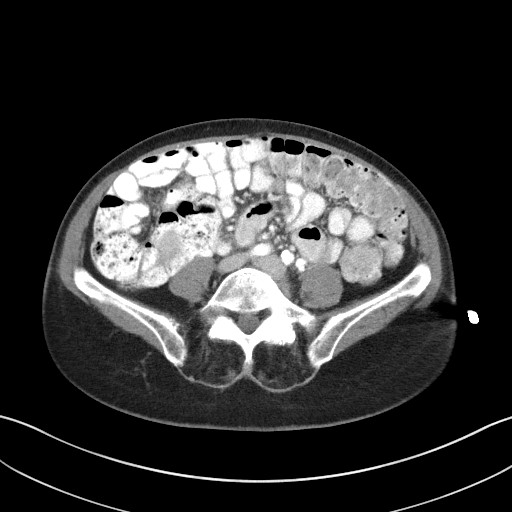
[im 46/77  soft-tissue]
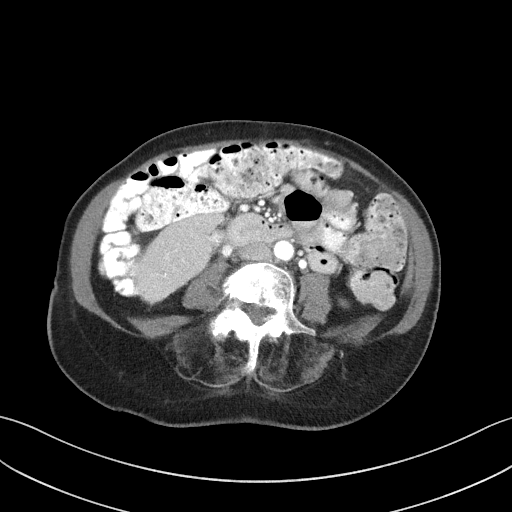
[im 51/77  soft-tissue]
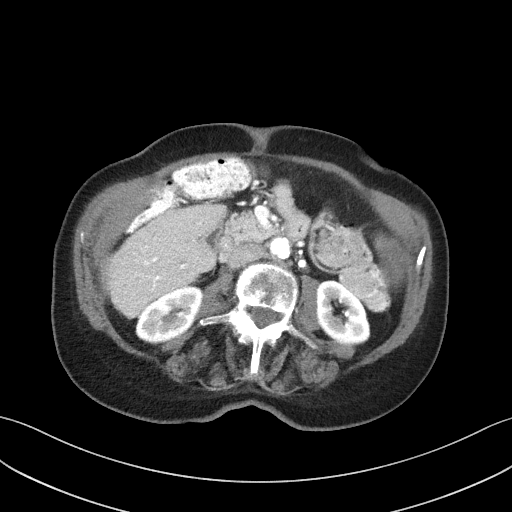
[im 51/77  bone]
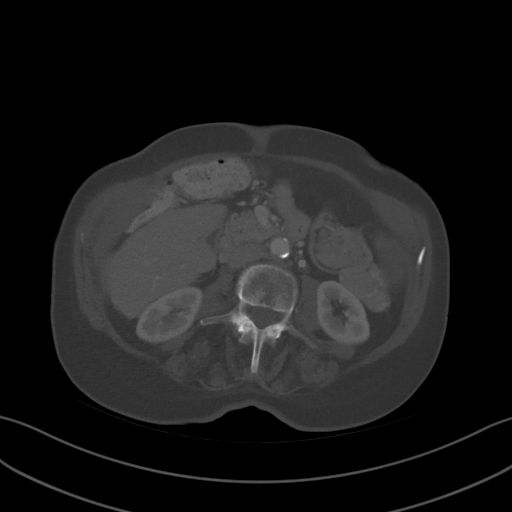
[im 61/77  soft-tissue]
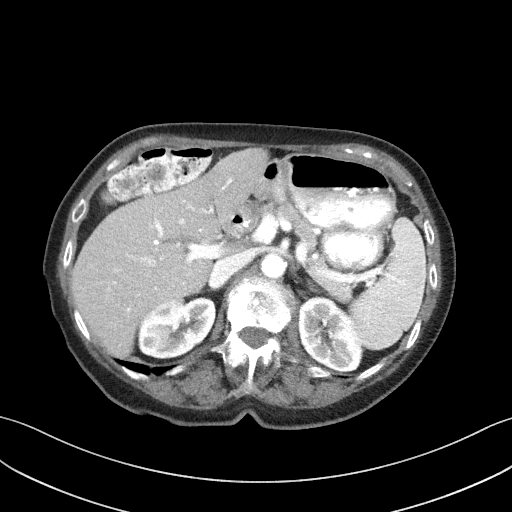
[im 66/77  soft-tissue]
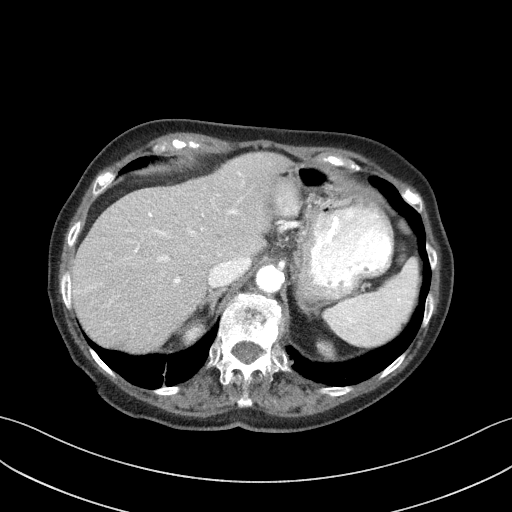
[im 71/77  soft-tissue]
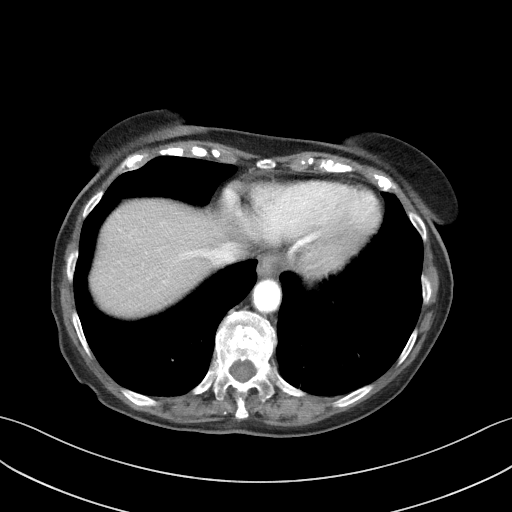

[Series 5: coronal st · coronal · 0.60mm/px · 3 of 74 slices shown]
[im 25/74  soft-tissue]
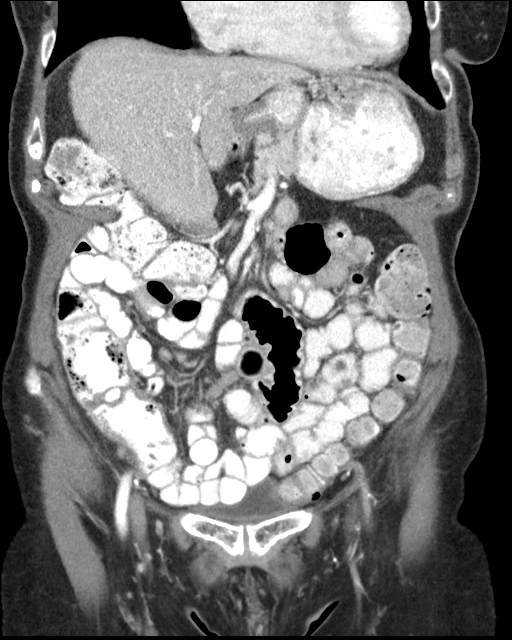
[im 33/74  soft-tissue]
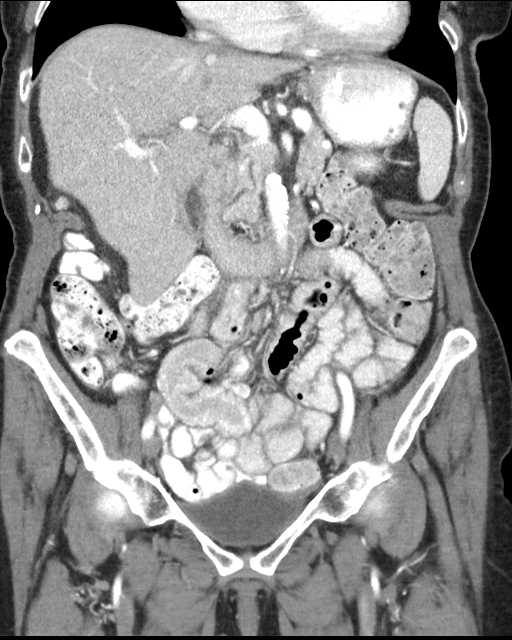
[im 41/74  soft-tissue]
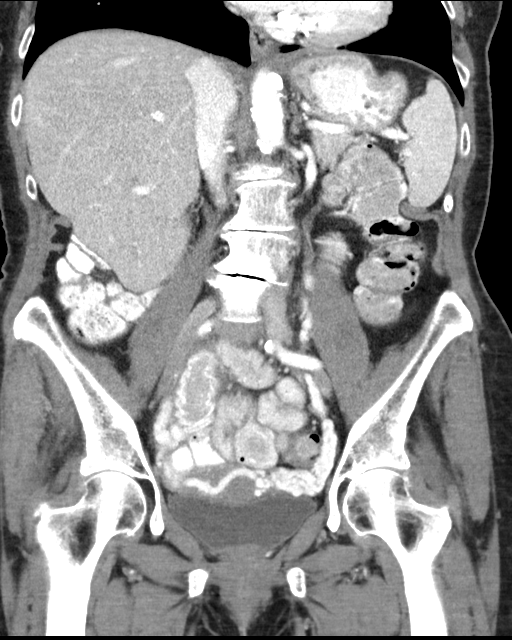

[15 of 46 positions shown; findings below may reference images not displayed]

FINDINGS: Lower chest: Stable scarring in both lower lobes. Mitral valve
calcification. Descending thoracic aortic atherosclerotic
calcification.

Hepatobiliary: Contracted gallbladder. Minimal transient hepatic
attenuation difference peripherally in the right hepatic lobe for
example on image 15 of series 2, likely incidental.

Pancreas: Unremarkable

Spleen: Unremarkable

Adrenals/Urinary Tract: Unremarkable

Stomach/Bowel: Numerous small bowel diverticular present especially
along pelvic loops of small bowel. On image 25 of series [DATE] of
these small bowel diverticula is currently gas-filled measuring
cm in diameter and appears to correspond to the previously stool
filled and inflamed diverticulum a previously measured 4.2 cm in
diameter. This is adjacent to the tip of the appendix but does not
appear to be arising from the appendix. There other scattered
smaller diverticula in this vicinity, as well as some indistinct
mesenteric stranding and potentially a small amount of fluid density
adjacent to regional loops of small bowel in the right pelvis.
Addition, there is substantial sigmoid colon diverticulosis
approximate to some of the stranding, and accordingly while
diverticulitis is likely I am uncertain whether represents
diverticulitis involving the small bowel diverticula or the sigmoid
colon. I do not currently see a discrete abscess or extraluminal
gas. The appendix appears unremarkable. Orally administered contrast
extends through to the rectum.

Vascular/Lymphatic: Aortoiliac atherosclerotic vascular disease. No
pathologic adenopathy.

Reproductive: Unremarkable

Other: No supplemental non-categorized findings.

Musculoskeletal: Mild levoconvex lumbar scoliosis with rotary
component. Prominent loss of intervertebral disc height at all
levels between T12 and L5, with grade 1 anterolisthesis of L4 on L5.
IMPRESSION: 1. Small bowel diverticulosis in the pelvis with suspected mild
diverticulitis. The stool filled ball like structures shown in the
midst of the inflammation on the prior exam are thought to be small
bowel diverticula which were inflamed; this appears improved now,
with the largest of these reduced in size and currently air filled
on image 25 of series 5. No extraluminal gas or abscess identified.
There is substantial adjacent sigmoid colon diverticulosis but I
tend to favor the small-bowel diverticulosis as the more likely
cause for the inflammatory findings.
2. Other imaging findings of potential clinical significance: Mitral
valve calcification. Stable scarring in both lower lobes. Mild
levoconvex lumbar scoliosis with rotary component. Multilevel
degenerative disc disease and degenerative disc disease.
3. Aortic atherosclerosis.

Aortic Atherosclerosis ([id number]).

## 2022-12-09 NOTE — Progress Notes (Signed)
[facility] - Clinic Note  12/10/2022    CHIEF COMPLAINT Patient presents for Retina Follow Up   HISTORY OF PRESENT ILLNESS: Margaret [name] is a 89 y.o. female who presents to the clinic today for:   HPI     Retina Follow Up   Patient presents with  Wet AMD.  In right eye.  This started 9 weeks ago.  I, the attending physician,  performed the HPI with the patient and updated documentation appropriately.        Comments   Patient here for 9 weeks retina follow up for exu ARMD OD. Patient states vision pretty good. Does change hour by hour. No eye pain.       Last edited by Rennis Chris, MD on 12/10/2022  4:40 PM.    Pt states she has good and bad days with her vision   Referring physician: Felix Pacini A, DO [address] [place],  [place] [address]  HISTORICAL INFORMATION:  Selected notes from the MEDICAL RECORD NUMBER Self referral for macular degeneration LEE: 03.21.19 (Dr. Prentice Docker in [place], [place]) [BCVA: OD: 20/40 OS: 20/40] Ocular Hx-POAG, non-exu ARMD, DES, pseudo, YAG PMH-astham, arthritis,    CURRENT MEDICATIONS: Current Outpatient Medications (Ophthalmic Drugs)  Medication Sig   cycloSPORINE (RESTASIS) 0.05 % ophthalmic emulsion Place 1 drop into both eyes 2 (two) times daily.   polyvinyl alcohol (LIQUIFILM TEARS) 1.4 % ophthalmic solution Place 1 drop into both eyes as needed for dry eyes.   No current facility-administered medications for this visit. (Ophthalmic Drugs)   Current Outpatient Medications (Other)  Medication Sig   acetaminophen (TYLENOL) 325 MG tablet Take 2 tablets (650 mg total) by mouth every 6 (six) hours as needed for moderate pain or headache.   albuterol (VENTOLIN HFA) 108 (90 Base) MCG/ACT inhaler Inhale 2 puffs into the lungs every 6 (six) hours as needed for wheezing or shortness of breath.   dicyclomine (BENTYL) 20 MG tablet Take 1 tablet (20 mg total) by mouth 2 (two) times daily.   levothyroxine (SYNTHROID) 50 MCG  tablet Take 1 tablet (50 mcg total) by mouth daily before breakfast.   montelukast (SINGULAIR) 10 MG tablet Take 1 tablet (10 mg total) by mouth at bedtime.   Multiple Vitamins-Minerals (PRESERVISION AREDS 2) CAPS Take 1 capsule by mouth 2 (two) times daily.   pregabalin (LYRICA) 25 MG capsule Take 1 capsule (25 mg total) by mouth at bedtime.   meloxicam (MOBIC) 15 MG tablet Take 1 tablet (15 mg total) by mouth daily.   No current facility-administered medications for this visit. (Other)   REVIEW OF SYSTEMS: ROS   Positive for: Musculoskeletal, Endocrine, Eyes Negative for: Constitutional, Gastrointestinal, Neurological, Skin, Genitourinary, HENT, Cardiovascular, Respiratory, Psychiatric, Allergic/Imm, Heme/Lymph Last edited by Laddie Aquas, COA on 12/10/2022  1:09 PM.       ALLERGIES Allergies  Allergen Reactions   Augmentin [Amoxicillin-Pot Clavulanate] Swelling and Rash    Swelling in face   Lactose Intolerance (Gi)    PAST MEDICAL HISTORY Past Medical History:  Diagnosis Date   Allergy    Arthritis of left knee    Asthma    Carotid artery stenosis 10/17/2012   stable report in 2018- "moderate on right, minimal on left"   Carpal tunnel syndrome 2018   left.   Cervical radiculopathy 05/16/2015   DDD C4-C5, C5-C6, and C6-C7.  Facet arthropathy throughout cervical spine.  Normal foraminal narrowing secondary to uncovertebral  arthropathy is seen bilaterally at C3-C4  and C5/C6   Chicken pox    DDD (degenerative disc disease), lumbar 04/26/2015   Mild levoscoliosis, right anterior listhesis of L4 and L5.  DDD at every level.  Facet arthropathy at multiple levels.   Deviated septum    Glaucoma    both eyes, mild opened angle   Heart murmur    History of colon polyps    History of fall    History of frequent urinary tract infections    klebs- pansensitive. c. freundii - resitant to cefazolin and augementin   Hypothyroidism    Intra-abdominal abscess (HCC) 03/10/2020    Iron deficiency anemia    prior pcp told her to take Fe 325 QD   Macular degeneration    h/o retinal edema   Mitral valve prolapse 08/27/2011   Murmur, cardiac 10/17/2012   Osteoarthritis    Osteoporosis    Pneumonia    Positive TB test    In college    Pseudophakia of both eyes    PVD (peripheral vascular disease) (HCC)    Rheumatic fever    89 years old    Skin lesion 12/31/2021   Syncope 05/17/2017   Thyroid disease    Urinary incontinence    Vertigo    had been prescribed antivert   Vitamin D deficiency    Past Surgical History:  Procedure Laterality Date   CARPAL TUNNEL RELEASE Bilateral 2019   CATARACT EXTRACTION Bilateral 1997   COLONOSCOPY  2010   NASAL SEPTUM SURGERY  2007   WRIST FRACTURE SURGERY Right 1996   FAMILY HISTORY Family History  Problem Relation Age of Onset   Arthritis Mother    COPD Mother    Prostate cancer Father    Arthritis Father    Hearing loss Father    Heart disease Father    Heart attack Father    Stroke Maternal Grandmother    Liver cancer Sister    Arthritis Brother    Prostate cancer Brother    COPD Brother    Heart disease Brother    Breast cancer Daughter    Blindness Maternal Grandfather    Stroke Maternal Grandfather    COPD Paternal Grandmother    Breast cancer Paternal Grandmother    SOCIAL HISTORY Social History   Tobacco Use   Smoking status: Former    Current packs/day: 0.00    Average packs/day: 0.5 packs/day for 13.0 years (6.5 ttl pk-yrs)    Types: Cigarettes    Start date: 63    Quit date: 1965    Years since quitting: 59.8   Smokeless tobacco: Never  Vaping Use   Vaping status: Never Used  Substance Use Topics   Alcohol use: Not Currently    Alcohol/week: 5.0 standard drinks of alcohol    Types: 5 Standard drinks or equivalent per week   Drug use: Never       OPHTHALMIC EXAM:  Base Eye Exam     Visual Acuity (Snellen - Linear)       Right Left   Dist cc 20/40 20/25 -1   Dist ph cc  NI NI    Correction: Glasses         Tonometry (Tonopen, 1:07 PM)       Right Left   Pressure 12 12         Pupils       Dark Light Shape React APD   Right 3 2 Round Brisk None   Left 3 2 Round Brisk None  Visual Fields (Counting fingers)       Left Right    Full Full         Extraocular Movement       Right Left    Full, Ortho Full, Ortho         Neuro/Psych     Oriented x3: Yes   Mood/Affect: Normal         Dilation     Both eyes: 1.0% Mydriacyl, 2.5% Phenylephrine @ 1:07 PM           Slit Lamp and Fundus Exam     External Exam       Right Left   External Normal Normal         Slit Lamp Exam       Right Left   Lids/Lashes Dermatochalasis - upper lid, Telangiectasia Dermatochalasis - upper lid, mild Meibomian gland dysfunction   Conjunctiva/Sclera Temporal Pinguecula White and quiet   Cornea Arcus, 1+ Punctate epithelial erosions, mild tear film debris, fine endo pigment Arcus, 1-2+ inferior Punctate epithelial erosions, mild tear film debris, fine endo pigment   Anterior Chamber Deep and quiet, narrow temporal angle Deep and quiet   Iris Round and dilated Round and dilated, pigmented mass at 0200 and 0730 behind iris, anterior to IOL -- ?regression of 0200 lesion   Lens PC IOL in good position with open PC PC IOL in good position with open PC   Anterior Vitreous Vitreous syneresis Vitreous syneresis, Posterior vitreous detachment         Fundus Exam       Right Left   Disc 360 Peripapillary atrophy, Sharp rim, 2+ Pallor sharp rim, temporal PPA, mild Pallor   C/D Ratio 0.3 0.3   Macula Flat, Blunted foveal reflex, drusen, RPE mottling and clumping, +central IRF / cystic changes -- stably improved, no heme, early RPE atrophy Flat, Blunted foveal reflex, drusen, RPE mottling, clumping and early atrophy, no heme or edema   Vessels attenuated, Tortuous attenuated, Tortuous   Periphery Attached, scattered Reticular  degeneration, pigmented cystoid degeneration temporally, No heme Attached, scattered Reticular degeneration, No heme           Refraction     Wearing Rx       Sphere Cylinder Axis Add   Right -0.75 +2.25 175 +2.75   Left -0.25 +1.25 165 +2.75           IMAGING AND PROCEDURES  Imaging and Procedures for @TODAY @  OCT, Retina - OU - Both Eyes       Right Eye Quality was good. Central Foveal Thickness: 274. Progression has been stable. Findings include normal foveal contour, no IRF, no SRF, retinal drusen , intraretinal hyper-reflective material, epiretinal membrane, outer retinal atrophy (Stable improvement in central cystic changes, Partial PVD).   Left Eye Quality was good. Central Foveal Thickness: 283. Progression has been stable. Findings include normal foveal contour, no IRF, no SRF, retinal drusen , pigment epithelial detachment, outer retinal atrophy (Trace ERM, Partial PVD).   Notes *Images captured and stored on drive  Diagnosis / Impression:  OD: Exudative ARMD - stable improvement in central cystic changes, Partial PVD OS: Nonexudative ARMD OS Partial PVD OU  Clinical management:  See below  Abbreviations: NFP - Normal foveal profile. CME - cystoid macular edema. PED - pigment epithelial detachment. IRF - intraretinal fluid. SRF - subretinal fluid. EZ - ellipsoid zone. ERM - epiretinal membrane. ORA - outer retinal atrophy. ORT - outer retinal tubulation. SRHM -  subretinal hyper-reflective material       Intravitreal Injection, Pharmacologic Agent - OD - Right Eye       Time Out 12/10/2022. 1:19 PM. Confirmed correct patient, procedure, site, and patient consented.   Anesthesia Topical anesthesia was used. Anesthetic medications included Lidocaine 2%, Proparacaine 0.5%.   Procedure Preparation included 5% betadine to ocular surface, eyelid speculum. A (32g) needle was used.   Injection: 6 mg faricimab-svoa 6 MG/0.05ML   Route: Intravitreal,  Site: Right Eye   NDC: [id number], Lot: [id number], Expiration date: 05/22/2024, Waste: 0 mL   Post-op Post injection exam found visual acuity of at least counting fingers. The patient tolerated the procedure well. There were no complications. The patient received written and verbal post procedure care education. Post injection medications were not given.            ASSESSMENT/PLAN:    ICD-10-CM   1. Exudative age-related macular degeneration of right eye with active choroidal neovascularization (HCC)  H35.3211 OCT, Retina - OU - Both Eyes    Intravitreal Injection, Pharmacologic Agent - OD - Right Eye    faricimab-svoa (VABYSMO) 6mg /0.53mL intravitreal injection    2. 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 Long-term use of Plaquenil  Z79.899     5. Pseudophakia of both eyes  Z96.1     6. Intermediate stage nonexudative age-related macular degeneration of both eyes  H35.3132     7. Dry eyes  H04.123     8. Primary open angle glaucoma (POAG) of both eyes, mild stage  H40.1131       1. Exudative age related macular degeneration, OD  - conversion from nonexudative ARMD noted on 9.23.22 visit  - s/p IVA OD #1 (09.23.22), #2 (10.21.22), #3 (11.18.22), #4 (12.30.22), #5 (02.13.23), #6 (03.24.23), #7 (05.08.23), #8 (06.09.23) -- IVA resistance  - s/p IVE OD #1 (07.24.23), #2 (08.25.23), #3 (09.29.23), #4 (10.27.23) -- IVE resistance  - s/p IVV OD #1 (12.01.23), #2 (01.05.34), #3 (02.02.24), #4 (03.01.24), #5 (03.29.24), #6 (04.26.24), #7 (05.24.24), #8 (06.28.24) #9 (08.16.24) - BCVA OD 20/40 -- slightly decreased  - OCT shows OD: stable improvement in central cystic changes, Partial PVD at 9 weeks - recommend IVV OD #9 today, 10.18.24 w/ f/u in 9 wks - pt wishes to be treated with IVV - RBA of procedure discussed, questions answered - Vabysmo informed consent obtained and signed, 12.01.23 - see  procedure note - f/u 9 weeks -- DFE/OCT, possible injection   2. Age related macular degeneration, non-exudative OS  - intermediate stage-no change from previous visit - The incidence, anatomy, and pathology of dry AMD, risk of progression, and the AREDS and AREDS 2 study including smoking risks discussed with patient.   - recommend Amsler grid monitoring  3. Iris/ciliary body mass OS - pigmented lesion behind iris but anterior to PCIOL at 0730 and 0300 - 0730 lesion spans 0630 to 0830 behind dilated iris and larger than 0300 lesion  - patient asymptomatic - pt saw Dr. Haywood Lasso, [facility] on 11.01.22 who dx her with iris margin cyst  4. Plaquenil use for RA  - baseline testing done 05.20.20 -- started plaquenil early May 2020 for RA - HVF 10-2 showed non-specific defects OU -- essentially normal baseline test - OCT shows no obvious plaquenil-related changes, but interval development of exudative ARMD OD as above - currently taking 200mg  / day  per Dr. Clarise Cruz instructions --> 3.29 mg/kg body wt per day  - po prednisone, currently as needed which is about once a week - and is also now on 10mg  po MTX weekly + folic acid - the American Academy of Ophthalmology recommends dosing 5mg /kg per day to reduce risk of retinal toxicity  - recommend consideration of using alternate medication for RA especially given co-morbidity of age-related macular degeneration OU  - [facility]  5. Pseudophakia OU - s/p CE/IOL w/ ?canaloplasty OU [facility]  - doing well  - continue to monitor  6. History of Glaucoma   - not currently on gtts -- IOP 12,14 - ?underwent canaloplasty in conjunction with CEIOL at [facility]   - referred to and now under the expert care of Dr. Alben Spittle -- initial Visit/consult 10.3.2019  7. Dry Eye Syndrome  - use AT's OU as needed  Ophthalmic Meds Ordered this visit:  Meds ordered this  encounter  Medications   faricimab-svoa (VABYSMO) 6mg /0.26mL intravitreal injection     This document serves as a record of services personally performed by Karie Chimera, MD, PhD. It was created on their behalf by Berlin Hun COT, an ophthalmic technician. The creation of this record is the provider's dictation and/or activities during the visit.    Electronically signed by: Berlin Hun COT 10.17.24 4:45 PM  This document serves as a record of services personally performed by Karie Chimera, MD, PhD. It was created on their behalf by Glee Arvin. Manson Passey, OA an ophthalmic technician. The creation of this record is the provider's dictation and/or activities during the visit.    Electronically signed by: Glee Arvin. Manson Passey, OA 12/10/22 4:45 PM  I have reviewed the above documentation for accuracy and completeness, and I agree with the above. Karie Chimera, M.D., Ph.D. 12/10/22 4:46 PM   Abbreviations: M myopia (nearsighted); A astigmatism; H hyperopia (farsighted); P presbyopia; Mrx spectacle prescription;  CTL contact lenses; OD right eye; OS left eye; OU both eyes  XT exotropia; ET esotropia; PEK punctate epithelial keratitis; PEE punctate epithelial erosions; DES dry eye syndrome; MGD meibomian gland dysfunction; ATs artificial tears; PFAT's preservative free artificial tears; NSC nuclear sclerotic cataract; PSC posterior subcapsular cataract; ERM epi-retinal membrane; PVD posterior vitreous detachment; RD retinal detachment; DM diabetes mellitus; DR diabetic retinopathy; NPDR non-proliferative diabetic retinopathy; PDR proliferative diabetic retinopathy; CSME clinically significant macular edema; DME diabetic macular edema; dbh dot blot hemorrhages; CWS cotton wool spot; POAG primary open angle glaucoma; C/D cup-to-disc ratio; HVF humphrey visual field; GVF goldmann visual field; OCT optical coherence tomography; IOP intraocular pressure; BRVO Branch retinal vein occlusion; CRVO  central retinal vein occlusion; CRAO central retinal artery occlusion; BRAO branch retinal artery occlusion; RT retinal tear; SB scleral buckle; PPV pars plana vitrectomy; VH Vitreous hemorrhage; PRP panretinal laser photocoagulation; IVK intravitreal kenalog; VMT vitreomacular traction; MH Macular hole;  NVD neovascularization of the disc; NVE neovascularization elsewhere; AREDS age related eye disease study; ARMD age related macular degeneration; POAG primary open angle glaucoma; EBMD epithelial/anterior basement membrane dystrophy; ACIOL anterior chamber intraocular lens; IOL intraocular lens; PCIOL posterior chamber intraocular lens; Phaco/IOL phacoemulsification with intraocular lens placement; PRK photorefractive keratectomy; LASIK laser assisted in situ keratomileusis; HTN hypertension; DM diabetes mellitus; COPD chronic obstructive pulmonary disease

## 2022-12-10 ENCOUNTER — Ambulatory Visit (INDEPENDENT_AMBULATORY_CARE_PROVIDER_SITE_OTHER): Payer: Medicare HMO | Admitting: Ophthalmology

## 2022-12-10 ENCOUNTER — Encounter (INDEPENDENT_AMBULATORY_CARE_PROVIDER_SITE_OTHER): Payer: Self-pay | Admitting: Ophthalmology

## 2022-12-10 DIAGNOSIS — D4989 Neoplasm of unspecified behavior of other specified sites: Secondary | ICD-10-CM

## 2022-12-10 DIAGNOSIS — H353132 Nonexudative age-related macular degeneration, bilateral, intermediate dry stage: Secondary | ICD-10-CM

## 2022-12-10 DIAGNOSIS — H353122 Nonexudative age-related macular degeneration, left eye, intermediate dry stage: Secondary | ICD-10-CM

## 2022-12-10 DIAGNOSIS — Z961 Presence of intraocular lens: Secondary | ICD-10-CM | POA: Diagnosis not present

## 2022-12-10 DIAGNOSIS — H04123 Dry eye syndrome of bilateral lacrimal glands: Secondary | ICD-10-CM

## 2022-12-10 DIAGNOSIS — Z79899 Other long term (current) drug therapy: Secondary | ICD-10-CM

## 2022-12-10 DIAGNOSIS — H353211 Exudative age-related macular degeneration, right eye, with active choroidal neovascularization: Secondary | ICD-10-CM | POA: Diagnosis not present

## 2022-12-10 DIAGNOSIS — H401131 Primary open-angle glaucoma, bilateral, mild stage: Secondary | ICD-10-CM

## 2022-12-10 MED ORDER — FARICIMAB-SVOA 6 MG/0.05ML IZ SOSY
6.0000 mg | PREFILLED_SYRINGE | INTRAVITREAL | Status: AC | PRN
  Administered 2022-12-10: 6 mg via INTRAVITREAL

## 2022-12-16 ENCOUNTER — Encounter: Payer: Self-pay | Admitting: Family Medicine

## 2022-12-16 ENCOUNTER — Ambulatory Visit: Payer: Medicare HMO | Admitting: Family Medicine

## 2022-12-16 VITALS — BP 118/78 | HR 66 | Temp 97.8°F | Wt 126.4 lb

## 2022-12-16 DIAGNOSIS — J302 Other seasonal allergic rhinitis: Secondary | ICD-10-CM

## 2022-12-16 DIAGNOSIS — M359 Systemic involvement of connective tissue, unspecified: Secondary | ICD-10-CM

## 2022-12-16 DIAGNOSIS — I739 Peripheral vascular disease, unspecified: Secondary | ICD-10-CM | POA: Diagnosis not present

## 2022-12-16 DIAGNOSIS — E034 Atrophy of thyroid (acquired): Secondary | ICD-10-CM

## 2022-12-16 DIAGNOSIS — E559 Vitamin D deficiency, unspecified: Secondary | ICD-10-CM

## 2022-12-16 DIAGNOSIS — G4762 Sleep related leg cramps: Secondary | ICD-10-CM

## 2022-12-16 DIAGNOSIS — M0600 Rheumatoid arthritis without rheumatoid factor, unspecified site: Secondary | ICD-10-CM

## 2022-12-16 DIAGNOSIS — M353 Polymyalgia rheumatica: Secondary | ICD-10-CM

## 2022-12-16 DIAGNOSIS — M810 Age-related osteoporosis without current pathological fracture: Secondary | ICD-10-CM

## 2022-12-16 DIAGNOSIS — I6529 Occlusion and stenosis of unspecified carotid artery: Secondary | ICD-10-CM

## 2022-12-16 MED ORDER — MONTELUKAST SODIUM 10 MG PO TABS: 10.0000 mg | ORAL_TABLET | Freq: Every day | ORAL | 1 refills | Status: DC

## 2022-12-16 MED ORDER — PREGABALIN 25 MG PO CAPS: 25.0000 mg | ORAL_CAPSULE | Freq: Every day | ORAL | 1 refills | Status: DC

## 2022-12-16 NOTE — Patient Instructions (Addendum)
Return in about 24 weeks (around 06/02/2023) for cpe (20 min), Routine chronic condition follow-up.        Great to see you today.  I have refilled the medication(s) we provide.   If labs were collected or images ordered, we will inform you of  results once we have received them and reviewed. We will contact you either by echart message, or telephone call.  Please give ample time to the testing facility, and our office to run,  receive and review results. Please do not call inquiring of results, even if you can see them in your chart. We will contact you as soon as we are able. If it has been over 1 week since the test was completed, and you have not yet heard from [place], then please call us.    - echart message- for normal results that have been seen by the patient already.   - telephone call: abnormal results or if patient has not viewed results in their echart.  If a referral to a specialist was entered for you, please call us in 2 weeks if you have not heard from the specialist office to schedule.

## 2022-12-16 NOTE — Progress Notes (Signed)
Margaret [name] , [birth year], 89 y.o., female MRN: [id number] Patient Care Team    Relationship Specialty Notifications Start End  [name], DO PCP - General Family Medicine  03/08/19   Jodelle Red, MD PCP - Cardiology Cardiology  04/30/22   Rennis Chris, MD Consulting Physician Ophthalmology  03/29/18   Grant Fontana, MD Consulting Physician Ophthalmology  03/29/18   Jacqlyn Krauss, MD Referring Physician Dermatology  08/02/19   [name], MD Consulting Physician Rheumatology  12/16/22     Chief Complaint  Patient presents with   restless leg     Subjective: Pt presents for an OV for Chronic Conditions/illness Management  PMR/RA: She is established with rheumatology.  She is on low-dose prednisone and colchicine, and had been prescribed MTX and  Plaquenil at different times in the past. Prior note: Patient presents today to discuss her arthralgias and myalgias.  Symptoms are worsening.  She reports increased symptoms in her left shoulder her left hand and her left hip.  Her symptoms are bilateral, worse on the left side. Her CMP, CRP, ESR, TSH, CBC and Lyme titers were all normal. Her ANA was positive, 1:640 nuclear homogenous. CCP and RF normal.  Hypothyroidism, unspecified type Patient reports compliance with levothyroxine 50 mcg daily.    Nocturnal leg cramps/RLS Patient reports the leg cramps have improved with the use of Lyrica 25 mg QHS.  Would like to continue this medication if she feels it is working well, patient reports nightly compliance Tried medications: Mirapex  Vitamin D insufficiency: pt is supplementing.   Seasonal allergies/Mild intermittent asthma, unspecified whether complicated Patient reports she has asthma and allergies.    Patient reports compliance with Singulair and her symptoms are well-controlled.  She also uses Flonase and albuterol as needed.       12/16/2022   10:04 AM 07/23/2022   11:16 AM 06/16/2022   10:22  AM 10/24/2021    9:10 AM 12/09/2020    9:53 AM  Depression screen PHQ 2/9  Decreased Interest 0 0 0 0 0  Down, Depressed, Hopeless 0 0 0 0 0  PHQ - 2 Score 0 0 0 0 0  Altered sleeping  1     Tired, decreased energy  1     Change in appetite  0     Feeling bad or failure about yourself   0     Trouble concentrating  1     Moving slowly or fidgety/restless  0     Suicidal thoughts  0     PHQ-9 Score  3     Difficult doing work/chores  Not difficult at all       Allergies  Allergen Reactions   Augmentin [Amoxicillin-Pot Clavulanate] Swelling and Rash    Swelling in face   Lactose Intolerance (Gi)    Penicillin G     Other Reaction(s): swelling of face   Social History   Social History Narrative   Marital status/children/pets: Widowed.  [place]oved to [place] from [place] area) 2019.  She shares a home with 1 of her daughters.  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   Cervical radiculopathy 05/16/2015   DDD C4-C5, C5-C6, and C6-C7.  Facet arthropathy throughout cervical spine.  Normal foraminal narrowing secondary to uncovertebral  arthropathy is seen bilaterally at C3-C4 and C5/C6   Chicken pox    DDD (degenerative disc disease), lumbar 04/26/2015   Mild levoscoliosis, right anterior listhesis of L4 and L5.  DDD at every level.  Facet arthropathy at multiple levels.   Deviated septum    Glaucoma    both eyes, mild opened angle   Heart murmur    History of colon polyps    History of fall    History of  frequent urinary tract infections    klebs- pansensitive. c. 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    Pseudophakia of both eyes    PVD (peripheral vascular disease) (HCC)    Rheumatic fever    89 years old    Skin lesion 12/31/2021   Syncope 05/17/2017   Thyroid disease    Urinary incontinence    Vertigo    had been prescribed antivert   Vitamin D deficiency    Past Surgical History:  Procedure Laterality Date   CARPAL TUNNEL RELEASE Bilateral 2019   CATARACT EXTRACTION Bilateral 1997   COLONOSCOPY  2010   NASAL SEPTUM SURGERY  2007   WRIST FRACTURE SURGERY Right 1996   Family History  Problem Relation Age of Onset   Arthritis Mother    COPD Mother    Prostate cancer Father    Arthritis Father    Hearing loss Father    Heart disease Father    Heart attack Father    Stroke Maternal Grandmother    Liver cancer Sister    Arthritis Brother    Prostate cancer Brother    COPD Brother    Heart disease Brother    Breast cancer Daughter    Blindness Maternal Grandfather    Stroke Maternal Grandfather    COPD Paternal Grandmother    Breast cancer Paternal Grandmother    Allergies as of 12/16/2022       Reactions   Augmentin [amoxicillin-pot Clavulanate] Swelling, Rash   Swelling in face   Lactose Intolerance (gi)    Penicillin G    Other Reaction(s): swelling of face        Medication List        Accurate as of December 16, 2022 11:04 AM. If you have any questions, ask your nurse or doctor.          STOP taking these medications    acetaminophen 325 MG tablet Commonly known as: TYLENOL Stopped by: Felix Pacini   dicyclomine 20  MG tablet Commonly known as: BENTYL Stopped by: Felix Pacini   meloxicam 15 MG tablet Commonly known as: MOBIC Stopped by: Felix Pacini  TAKE these medications    albuterol 108 (90 Base) MCG/ACT inhaler Commonly known as: VENTOLIN HFA Inhale 2 puffs into the lungs every 6 (six) hours as needed for wheezing or shortness of breath.   colchicine 0.6 MG tablet Take 0.6 mg by mouth daily.   cycloSPORINE 0.05 % ophthalmic emulsion Commonly known as: RESTASIS Place 1 drop into both eyes 2 (two) times daily.   levothyroxine 50 MCG tablet Commonly known as: SYNTHROID Take 1 tablet (50 mcg total) by mouth daily before breakfast.   montelukast 10 MG tablet Commonly known as: SINGULAIR Take 1 tablet (10 mg total) by mouth at bedtime.   polyvinyl alcohol 1.4 % ophthalmic solution Commonly known as: LIQUIFILM TEARS Place 1 drop into both eyes as needed for dry eyes.   predniSONE 5 MG tablet Commonly known as: DELTASONE Take 5 mg by mouth daily. Every other day   pregabalin 25 MG capsule Commonly known as: Lyrica Take 1 capsule (25 mg total) by mouth at bedtime.   PreserVision AREDS 2 Caps Take 1 capsule by mouth 2 (two) times daily.   triamcinolone cream 0.1 % Commonly known as: KENALOG 1 application Externally once a day for 30 days        All past medical history, surgical history, allergies, family history, immunizations andmedications were updated in the EMR today and reviewed under the history and medication portions of their EMR.     ROS: Negative, with the exception of above mentioned in HPI  Objective:  BP 118/78   Pulse 66   Temp 97.8 F (36.6 C)   Wt 126 lb 6.4 oz (57.3 kg)   SpO2 98%   BMI 24.69 kg/m  Body mass index is 24.69 kg/m. Physical Exam Vitals and nursing note reviewed.  Constitutional:      General: She is not in acute distress.    Appearance: Normal appearance. She is not ill-appearing, toxic-appearing or diaphoretic.  HENT:      Head: Normocephalic and atraumatic.  Eyes:     General: No scleral icterus.       Right eye: No discharge.        Left eye: No discharge.     Extraocular Movements: Extraocular movements intact.     Conjunctiva/sclera: Conjunctivae normal.     Pupils: Pupils are equal, round, and reactive to light.  Cardiovascular:     Rate and Rhythm: Normal rate and regular rhythm.     Heart sounds: Murmur (3/6 SM) heard.  Pulmonary:     Effort: Pulmonary effort is normal. No respiratory distress.     Breath sounds: Normal breath sounds. No wheezing, rhonchi or rales.  Musculoskeletal:     Cervical back: Neck supple.     Right lower leg: No edema.     Left lower leg: No edema.  Skin:    General: Skin is warm.  Neurological:     Mental Status: She is alert and oriented to person, place, and time. Mental status is at baseline.     Motor: No weakness.     Gait: Gait normal.  Psychiatric:        Mood and Affect: Mood normal.        Behavior: Behavior normal.        Thought Content: Thought content normal.        Judgment: Judgment normal.     No results found. No results found. No results found for this or any previous visit (from the past 24 hour(s)).   Assessment/Plan:  Dasha Olver is a 89 y.o. female present for OV for Chronic Conditions/illness Management Hypothyroidism, unspecified type Continue levothyroxine 50 mcg daily.  Labs due  05/2023  Vit d def/osteoporosis:  prolia injections are provided by rheumatology.  Vit D, PTH and calcium levels UTD  Rheumatoid arthritis/PMR: - Now established with rheumatology  Continue routine follow-ups with rheumatology  Nocturnal leg cramps/RLS/PVD Stable Continue Lyrica 25 mg nightly Failed:  Mirapex. [place] controlled substance database reviewed 12/16/22  Seasonal allergies/Mild intermittent asthma, unspecified whether complicated Stable Continue Singulair  Continue albuterol as needed  Continue Flonase   Stenosis of  carotid artery, unspecified laterality Mod-severe AS> est wi cardio with q 6 mos follow ups   Reviewed expectations re: course of current medical issues. Discussed self-management of symptoms. Outlined signs and symptoms indicating need for more acute intervention. Patient verbalized understanding and all questions were answered. Patient received an After-Visit Summary.    No orders of the defined types were placed in this encounter.   Meds ordered this encounter  Medications   montelukast (SINGULAIR) 10 MG tablet    Sig: Take 1 tablet (10 mg total) by mouth at bedtime.    Dispense:  90 tablet    Refill:  1   pregabalin (LYRICA) 25 MG capsule    Sig: Take 1 capsule (25 mg total) by mouth at bedtime.    Dispense:  90 capsule    Refill:  1    Referral Orders  No referral(s) requested today     Note is dictated utilizing voice recognition software. Although note has been proof read prior to signing, occasional typographical errors still can be missed. If any questions arise, please do not hesitate to call for verification.   electronically signed by:  Felix Pacini, DO  [facility] [place]

## 2023-01-04 ENCOUNTER — Encounter: Payer: Medicare HMO | Admitting: Internal Medicine

## 2023-01-12 ENCOUNTER — Ambulatory Visit: Payer: Medicare HMO

## 2023-01-12 DIAGNOSIS — Z0000 Encounter for general adult medical examination without abnormal findings: Secondary | ICD-10-CM

## 2023-01-12 NOTE — Progress Notes (Signed)
Subjective:   Margaret [name] is a 89 y.o. female who presents for Medicare Annual (Subsequent) preventive examination.  Visit Complete: Virtual I connected with  Valentino Saxon on 01/12/23 by a audio enabled telemedicine application and verified that I am speaking with the correct person using two identifiers.  Patient Location: Home  Provider Location: Home Office  I discussed the limitations of evaluation and management by telemedicine. The patient expressed understanding and agreed to proceed.  Vital Signs: Because this visit was a virtual/telehealth visit, some criteria may be missing or patient reported. Any vitals not documented were not able to be obtained and vitals that have been documented are patient reported.  Patient Medicare AWV questionnaire was completed by the patient on 12-16-2022; I have confirmed that all information answered by patient is correct and no changes since this date.  Cardiac Risk Factors include: advanced age (>71men, >28 women)     Objective:    Today's Vitals   01/12/23 1457  PainSc: 5    There is no height or weight on file to calculate BMI.     01/12/2023    3:01 PM 08/23/2022   10:51 PM 07/28/2022   10:00 PM 07/28/2022    1:09 PM 10/24/2021    9:13 AM 10/22/2020    1:14 PM 03/11/2020    1:41 AM  Advanced Directives  Does Patient Have a Medical Advance Directive? Yes No Yes Yes Yes Yes Yes  Type of Clinical research associate of [place];Living will Healthcare Power of [place];Living will Healthcare Power of [employer] of [place];Living will  Does patient want to make changes to medical advance directive?  No - Patient declined No - Patient declined    No - Patient declined  Copy of Healthcare Power of Attorney in Chart? No - copy requested     No - copy requested   Would patient like information on creating a medical advance directive?  No - Patient declined         Current Medications  (verified) Outpatient Encounter Medications as of 01/12/2023  Medication Sig   albuterol (VENTOLIN HFA) 108 (90 Base) MCG/ACT inhaler Inhale 2 puffs into the lungs every 6 (six) hours as needed for wheezing or shortness of breath.   colchicine 0.6 MG tablet Take 0.6 mg by mouth daily.   cycloSPORINE (RESTASIS) 0.05 % ophthalmic emulsion Place 1 drop into both eyes 2 (two) times daily.   levothyroxine (SYNTHROID) 50 MCG tablet Take 1 tablet (50 mcg total) by mouth daily before breakfast.   montelukast (SINGULAIR) 10 MG tablet Take 1 tablet (10 mg total) by mouth at bedtime.   Multiple Vitamins-Minerals (PRESERVISION AREDS 2) CAPS Take 1 capsule by mouth 2 (two) times daily.   polyvinyl alcohol (LIQUIFILM TEARS) 1.4 % ophthalmic solution Place 1 drop into both eyes as needed for dry eyes.   predniSONE (DELTASONE) 5 MG tablet Take 5 mg by mouth daily. Every other day   pregabalin (LYRICA) 25 MG capsule Take 1 capsule (25 mg total) by mouth at bedtime.   triamcinolone cream (KENALOG) 0.1 % 1 application Externally once a day for 30 days   No facility-administered encounter medications on file as of 01/12/2023.    Allergies (verified) Augmentin [amoxicillin-pot clavulanate], Lactose intolerance (gi), and Penicillin g   History: Past Medical History:  Diagnosis Date   Allergy    Arthritis of left knee    Asthma    Carotid artery stenosis 10/17/2012  stable report in 2018- "moderate on right, minimal on left"   Carpal tunnel syndrome 2018   left.   Cervical radiculopathy 05/16/2015   DDD C4-C5, C5-C6, and C6-C7.  Facet arthropathy throughout cervical spine.  Normal foraminal narrowing secondary to uncovertebral  arthropathy is seen bilaterally at C3-C4 and C5/C6   Chicken pox    DDD (degenerative disc disease), lumbar 04/26/2015   Mild levoscoliosis, right anterior listhesis of L4 and L5.  DDD at every level.  Facet arthropathy at multiple levels.   Deviated septum    Glaucoma    both  eyes, mild opened angle   Heart murmur    History of colon polyps    History of fall    History of frequent urinary tract infections    klebs- pansensitive. c. 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    Pseudophakia of both eyes    PVD (peripheral vascular disease) (HCC)    Rheumatic fever    89 years old    Skin lesion 12/31/2021   Syncope 05/17/2017   Thyroid disease    Urinary incontinence    Vertigo    had been prescribed antivert   Vitamin D deficiency    Past Surgical History:  Procedure Laterality Date   CARPAL TUNNEL RELEASE Bilateral 2019   CATARACT EXTRACTION Bilateral 1997   COLONOSCOPY  2010   NASAL SEPTUM SURGERY  2007   WRIST FRACTURE SURGERY Right 1996   Family History  Problem Relation Age of Onset   Arthritis Mother    COPD Mother    Prostate cancer Father    Arthritis Father    Hearing loss Father    Heart disease Father    Heart attack Father    Stroke Maternal Grandmother    Liver cancer Sister    Arthritis Brother    Prostate cancer Brother    COPD Brother    Heart disease Brother    Breast cancer Daughter    Blindness Maternal Grandfather    Stroke Maternal Grandfather    COPD Paternal Grandmother    Breast cancer Paternal Grandmother    Social History   Socioeconomic History   Marital status: Widowed    Spouse name: Not on file   Number of children: 3   Years of education: Not on file   Highest education level: Not on file  Occupational History   Occupation: retired  Tobacco Use   Smoking status: Former    Current packs/day: 0.00    Average packs/day: 0.5 packs/day for 13.0 years (6.5 ttl pk-yrs)     Types: Cigarettes    Start date: 96    Quit date: 1965    Years since quitting: 59.9   Smokeless tobacco: Never  Vaping Use   Vaping status: Never Used  Substance and Sexual Activity   Alcohol use: Not Currently    Alcohol/week: 5.0 standard drinks of alcohol    Types: 5 Standard drinks or equivalent per week   Drug use: Never   Sexual activity: Not Currently    Partners: Male  Other Topics Concern  Not on file  Social History Narrative   Marital status/children/pets: Widowed.  Moved to [place] from [place] area) 2019.  She shares a home with 1 of her daughters.  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, retired Engineer, agricultural:      -smoke alarm in the home:Yes     - wears seatbelt: Yes     - Feels safe in their relationships: Yes      2 caffeinated beverages a day no alcohol tobacco or drug use   Social Determinants of Health   Financial Resource Strain: Low Risk  (10/24/2021)   Overall Financial Resource Strain (CARDIA)    Difficulty of Paying Living Expenses: Not hard at all  Food Insecurity: No Food Insecurity (01/12/2023)   Hunger Vital Sign    Worried About Running Out of Food in the Last Year: Never true    Ran Out of Food in the Last Year: Never true  Transportation Needs: No Transportation Needs (01/12/2023)   PRAPARE - Administrator, Civil Service (Medical): No    Lack of Transportation (Non-Medical): No  Physical Activity: Inactive (01/12/2023)   Exercise Vital Sign    Days of Exercise per Week: 0 days    Minutes of Exercise per Session: 0 min  Stress: No Stress Concern Present (01/12/2023)   [employer] of Occupational Health - Occupational Stress Questionnaire    Feeling of Stress : Not at all  Social Connections: Socially Isolated (01/12/2023)   Social Connection and Isolation Panel [NHANES]    Frequency of Communication with Friends and Family: Twice a week    Frequency of Social  Gatherings with Friends and Family: More than three times a week    Attends Religious Services: Never    Database administrator or Organizations: No    Attends Banker Meetings: Not on file    Marital Status: Widowed    Tobacco Counseling Counseling given: Not Answered   Clinical Intake:  Pre-visit preparation completed: Yes  Pain : 0-10 Pain Score: 5  Pain Type: Acute pain Pain Location: Head (next to right eye skin caner removed) Pain Onset: In the past 7 days Pain Frequency: Intermittent Pain Relieving Factors: tylenol  Pain Relieving Factors: tylenol  Diabetes: No  How often do you need to have someone help you when you read instructions, pamphlets, or other written materials from your doctor or pharmacy?: 1 - Never  Interpreter Needed?: No  Information entered by :: Remi Haggard LPN   Activities of Daily Living    01/12/2023    3:02 PM 07/28/2022   10:00 PM  In your present state of health, do you have any difficulty performing the following activities:  Hearing? 1 0  Vision? 1 0  Difficulty concentrating or making decisions? 1 0  Walking or climbing stairs? 1 0  Dressing or bathing? 0 0  Doing errands, shopping? 0 0  Preparing Food and eating ? N   Using the Toilet? N   In the past six months, have you accidently leaked urine? Y   Do you have problems with loss of bowel control? N   Managing your Medications? N   Managing your Finances? N   Housekeeping or managing your Housekeeping? N     Patient Care Team: [name], DO as PCP - General (Family Medicine) Jodelle Red, MD as PCP - Cardiology (Cardiology) Rennis Chris, MD as Consulting Physician (Ophthalmology) Alben Spittle, [place]  Juanita Craver, MD as Consulting Physician (Ophthalmology) [place], Elvin So, MD as Referring Physician (Dermatology) [name], MD as Consulting Physician (Rheumatology)  Indicate any recent Medical Services you may have received from other than [facility]  providers in the past year (date may be approximate).     Assessment:   This is a routine wellness examination for [place].  Hearing/Vision screen Hearing Screening - Comments:: Bilateral hearing aids Vision Screening - Comments:: Up to date [facility]     Macular Degeneration    Goals Addressed             This Visit's Progress    Patient Stated       Continue current lifestyle Like to garden a little more       Depression Screen    01/12/2023    3:05 PM 12/16/2022   10:04 AM 07/23/2022   11:16 AM 06/16/2022   10:22 AM 10/24/2021    9:10 AM 12/09/2020    9:53 AM 10/22/2020    1:13 PM  PHQ 2/9 Scores  PHQ - 2 Score 0 0 0 0 0 0 0  PHQ- 9 Score 2  3        Fall Risk    01/12/2023    2:56 PM 12/16/2022   10:04 AM 09/17/2022    1:40 PM 07/23/2022   11:16 AM 06/16/2022   10:22 AM  Fall Risk   Falls in the past year? 1 1 1 1  0  Number falls in past yr: 1 1 0 1 0  Injury with Fall? 0 0 0 1 0  Risk for fall due to : Impaired balance/gait History of fall(s);Impaired mobility;Impaired vision  History of fall(s)   Follow up Falls evaluation completed;Education provided;Falls prevention discussed Falls evaluation completed  Falls evaluation completed Falls evaluation completed    MEDICARE RISK AT HOME: Medicare Risk at Home Any stairs in or around the home?: Yes If so, are there any without handrails?: No Home free of loose throw rugs in walkways, pet beds, electrical cords, etc?: Yes Adequate lighting in your home to reduce risk of falls?: Yes Life alert?: No Use of a cane, walker or w/c?: No Grab bars in the bathroom?: No Shower chair or bench in shower?: No Elevated toilet seat or a handicapped toilet?: No  TIMED UP AND GO:  Was the test performed?  No    Cognitive Function:        01/12/2023    3:07 PM 10/24/2021   10:02 AM  6CIT Screen  What Year? 0 points 0 points  What month? 0 points 0 points  What time? 0 points 0 points  Count back from 20 0 points 0  points  Months in reverse 0 points   Repeat phrase 4 points 0 points  Total Score 4 points     Immunizations Immunization History  Administered Date(s) Administered   Fluad Quad(high Dose 65+) 12/09/2020   IPV 08/09/2018, 02/27/2019, 08/29/2019   Influenza, High Dose Seasonal PF 12/14/2017, 11/23/2022   Influenza-Unspecified 10/10/2019, 11/04/2021   PFIZER(Purple Top)SARS-COV-2 Vaccination 03/30/2019, 04/24/2019, 10/10/2019, 08/08/2020   PNEUMOCOCCAL CONJUGATE-20 12/09/2020   Tdap 02/22/2014   Zoster Recombinant(Shingrix) 11/07/2018, 01/08/2019   Zoster, Live 02/22/2005    TDAP status: Up to date  Flu Vaccine status: Up to date  Pneumococcal vaccine status: Up to date  Covid-19 vaccine status: Information provided on how to obtain vaccines.   Qualifies for Shingles Vaccine? No   Zostavax completed Yes   Shingrix Completed?: Yes  Screening  Tests Health Maintenance  Topic Date Due   Medicare Annual Wellness (AWV)  01/12/2024   DTaP/Tdap/Td (2 - Td or Tdap) 02/23/2024   Pneumonia Vaccine 76+ Years old  Completed   INFLUENZA VACCINE  Completed   DEXA SCAN  Completed   Zoster Vaccines- Shingrix  Completed   HPV VACCINES  Aged Out   COVID-19 Vaccine  Discontinued    Health Maintenance  There are no preventive care reminders to display for this patient.   Colorectal cancer screening: No longer required.   Mammogram status: No longer required due to age.  Bone Density  patient is going to speak with her Rheumatologist  Lung Cancer Screening: (Low Dose CT Chest recommended if Age 54-80 years, 20 pack-year currently smoking OR have quit w/in 15years.) does not qualify.   Lung Cancer Screening Referral:   Additional Screening:  Hepatitis C Screening: does not qualify;  Vision Screening: Recommended annual ophthalmology exams for early detection of glaucoma and other disorders of the eye. Is the patient up to date with their annual eye exam?  Yes  Who is the  provider or what is the name of the office in which the patient attends annual eye exams? [facility] If pt is not established with a provider, would they like to be referred to a provider to establish care? No .   Dental Screening: Recommended annual dental exams for proper oral hygiene    Community Resource Referral / Chronic Care Management: CRR required this visit?  No   CCM required this visit?  No     Plan:     I have personally reviewed and noted the following in the patient's chart:   Medical and social history Use of alcohol, tobacco or illicit drugs  Current medications and supplements including opioid prescriptions. Patient is not currently taking opioid prescriptions. Functional ability and status Nutritional status Physical activity Advanced directives List of other physicians Hospitalizations, surgeries, and ER visits in previous 12 months Vitals Screenings to include cognitive, depression, and falls Referrals and appointments  In addition, I have reviewed and discussed with patient certain preventive protocols, quality metrics, and best practice recommendations. A written personalized care plan for preventive services as well as general preventive health recommendations were provided to patient.     Remi Haggard, LPN   19/14/7829   After Visit Summary: (MyChart) Due to this being a telephonic visit, the after visit summary with patients personalized plan was offered to patient via MyChart   Nurse Notes:

## 2023-01-12 NOTE — Patient Instructions (Signed)
Margaret [name] , Thank you for taking time to come for your Medicare Wellness Visit. I appreciate your ongoing commitment to your health goals. Please review the following plan we discussed and let me know if I can assist you in the future.   Screening recommendations/referrals: Colonoscopy: no longer required Mammogram: no longer required Bone Density: Education provided Recommended yearly ophthalmology/optometry visit for glaucoma screening and checkup Recommended yearly dental visit for hygiene and checkup  Vaccinations: Influenza vaccine: up to date Pneumococcal vaccine: up to date Tdap vaccine: up to date Shingles vaccine: up to date      Preventive Care 65 Years and Older, Female Preventive care refers to lifestyle choices and visits with your health care provider that can promote health and wellness. What does preventive care include? A yearly physical exam. This is also called an annual well check. Dental exams once or twice a year. Routine eye exams. Ask your health care provider how often you should have your eyes checked. Personal lifestyle choices, including: Daily care of your teeth and gums. Regular physical activity. Eating a healthy diet. Avoiding tobacco and drug use. Limiting alcohol use. Practicing safe sex. Taking low-dose aspirin every day. Taking vitamin and mineral supplements as recommended by your health care provider. What happens during an annual well check? The services and screenings done by your health care provider during your annual well check will depend on your age, overall health, lifestyle risk factors, and family history of disease. Counseling  Your health care provider may ask you questions about your: Alcohol use. Tobacco use. Drug use. Emotional well-being. Home and relationship well-being. Sexual activity. Eating habits. History of falls. Memory and ability to understand (cognition). Work and work Astronomer. Reproductive  health. Screening  You may have the following tests or measurements: Height, weight, and BMI. Blood pressure. Lipid and cholesterol levels. These may be checked every 5 years, or more frequently if you are over 79 years old. Skin check. Lung cancer screening. You may have this screening every year starting at age 103 if you have a 30-pack-year history of smoking and currently smoke or have quit within the past 15 years. Fecal occult blood test (FOBT) of the stool. You may have this test every year starting at age 52. Flexible sigmoidoscopy or colonoscopy. You may have a sigmoidoscopy every 5 years or a colonoscopy every 10 years starting at age 29. Hepatitis C blood test. Hepatitis B blood test. Sexually transmitted disease (STD) testing. Diabetes screening. This is done by checking your blood sugar (glucose) after you have not eaten for a while (fasting). You may have this done every 1-3 years. Bone density scan. This is done to screen for osteoporosis. You may have this done starting at age 34. Mammogram. This may be done every 1-2 years. Talk to your health care provider about how often you should have regular mammograms. Talk with your health care provider about your test results, treatment options, and if necessary, the need for more tests. Vaccines  Your health care provider may recommend certain vaccines, such as: Influenza vaccine. This is recommended every year. Tetanus, diphtheria, and acellular pertussis (Tdap, Td) vaccine. You may need a Td booster every 10 years. Zoster vaccine. You may need this after age 43. Pneumococcal 13-valent conjugate (PCV13) vaccine. One dose is recommended after age 28. Pneumococcal polysaccharide (PPSV23) vaccine. One dose is recommended after age 42. Talk to your health care provider about which screenings and vaccines you need and how often you need them. This information  is not intended to replace advice given to you by your health care provider.  Make sure you discuss any questions you have with your health care provider. Document Released: 03/07/2015 Document Revised: 10/29/2015 Document Reviewed: 12/10/2014 Elsevier Interactive Patient Education  2017 [employer].  Fall Prevention in the Home Falls can cause injuries. They can happen to people of all ages. There are many things you can do to make your home safe and to help prevent falls. What can I do on the outside of my home? Regularly fix the edges of walkways and driveways and fix any cracks. Remove anything that might make you trip as you walk through a door, such as a raised step or threshold. Trim any bushes or trees on the path to your home. Use bright outdoor lighting. Clear any walking paths of anything that might make someone trip, such as rocks or tools. Regularly check to see if handrails are loose or broken. Make sure that both sides of any steps have handrails. Any raised decks and porches should have guardrails on the edges. Have any leaves, snow, or ice cleared regularly. Use sand or salt on walking paths during winter. Clean up any spills in your garage right away. This includes oil or grease spills. What can I do in the bathroom? Use night lights. Install grab bars by the toilet and in the tub and shower. Do not use towel bars as grab bars. Use non-skid mats or decals in the tub or shower. If you need to sit down in the shower, use a plastic, non-slip stool. Keep the floor dry. Clean up any water that spills on the floor as soon as it happens. Remove soap buildup in the tub or shower regularly. Attach bath mats securely with double-sided non-slip rug tape. Do not have throw rugs and other things on the floor that can make you trip. What can I do in the bedroom? Use night lights. Make sure that you have a light by your bed that is easy to reach. Do not use any sheets or blankets that are too big for your bed. They should not hang down onto the floor. Have a  firm chair that has side arms. You can use this for support while you get dressed. Do not have throw rugs and other things on the floor that can make you trip. What can I do in the kitchen? Clean up any spills right away. Avoid walking on wet floors. Keep items that you use a lot in easy-to-reach places. If you need to reach something above you, use a strong step stool that has a grab bar. Keep electrical cords out of the way. Do not use floor polish or wax that makes floors slippery. If you must use wax, use non-skid floor wax. Do not have throw rugs and other things on the floor that can make you trip. What can I do with my stairs? Do not leave any items on the stairs. Make sure that there are handrails on both sides of the stairs and use them. Fix handrails that are broken or loose. Make sure that handrails are as long as the stairways. Check any carpeting to make sure that it is firmly attached to the stairs. Fix any carpet that is loose or worn. Avoid having throw rugs at the top or bottom of the stairs. If you do have throw rugs, attach them to the floor with carpet tape. Make sure that you have a light switch at the top of  the stairs and the bottom of the stairs. If you do not have them, ask someone to add them for you. What else can I do to help prevent falls? Wear shoes that: Do not have high heels. Have rubber bottoms. Are comfortable and fit you well. Are closed at the toe. Do not wear sandals. If you use a stepladder: Make sure that it is fully opened. Do not climb a closed stepladder. Make sure that both sides of the stepladder are locked into place. Ask someone to hold it for you, if possible. Clearly mark and make sure that you can see: Any grab bars or handrails. First and last steps. Where the edge of each step is. Use tools that help you move around (mobility aids) if they are needed. These include: Canes. Walkers. Scooters. Crutches. Turn on the lights when you  go into a dark area. Replace any light bulbs as soon as they burn out. Set up your furniture so you have a clear path. Avoid moving your furniture around. If any of your floors are uneven, fix them. If there are any pets around you, be aware of where they are. Review your medicines with your doctor. Some medicines can make you feel dizzy. This can increase your chance of falling. Ask your doctor what other things that you can do to help prevent falls. This information is not intended to replace advice given to you by your health care provider. Make sure you discuss any questions you have with your health care provider. Document Released: 12/05/2008 Document Revised: 07/17/2015 Document Reviewed: 03/15/2014 Elsevier Interactive Patient Education  2017 [employer].

## 2023-02-09 NOTE — Progress Notes (Signed)
[facility] - Clinic Note  02/11/2023    CHIEF COMPLAINT Patient presents for Retina Follow Up   HISTORY OF PRESENT ILLNESS: Margaret [name] is a 89 y.o. female who presents to the clinic today for:   HPI     Retina Follow Up   Patient presents with  Wet AMD.  In right eye.  This started 9 weeks ago.  I, the attending physician,  performed the HPI with the patient and updated documentation appropriately.        Comments   Patient is unsure if the vision has changed. She is using Restasis OU BID.      Last edited by Rennis Chris, MD on 02/12/2023  2:20 AM.     Pt states   Referring physician: [name], DO [address] [place],  [place] [address]  HISTORICAL INFORMATION:  Selected notes from the MEDICAL RECORD NUMBER Self referral for macular degeneration LEE: 03.21.19 (Dr. Prentice Docker in [place], [place]) [BCVA: OD: 20/40 OS: 20/40] Ocular Hx-POAG, non-exu ARMD, DES, pseudo, YAG PMH-astham, arthritis,    CURRENT MEDICATIONS: Current Outpatient Medications (Ophthalmic Drugs)  Medication Sig   cycloSPORINE (RESTASIS) 0.05 % ophthalmic emulsion Place 1 drop into both eyes 2 (two) times daily.   polyvinyl alcohol (LIQUIFILM TEARS) 1.4 % ophthalmic solution Place 1 drop into both eyes as needed for dry eyes.   No current facility-administered medications for this visit. (Ophthalmic Drugs)   Current Outpatient Medications (Other)  Medication Sig   albuterol (VENTOLIN HFA) 108 (90 Base) MCG/ACT inhaler Inhale 2 puffs into the lungs every 6 (six) hours as needed for wheezing or shortness of breath.   colchicine 0.6 MG tablet Take 0.6 mg by mouth daily.   levothyroxine (SYNTHROID) 50 MCG tablet Take 1 tablet (50 mcg total) by mouth daily before breakfast.   montelukast (SINGULAIR) 10 MG tablet Take 1 tablet (10 mg total) by mouth at bedtime.   Multiple Vitamins-Minerals (PRESERVISION AREDS 2) CAPS Take 1 capsule by mouth 2 (two) times daily.   predniSONE  (DELTASONE) 5 MG tablet Take 5 mg by mouth daily. Every other day   pregabalin (LYRICA) 25 MG capsule Take 1 capsule (25 mg total) by mouth at bedtime.   triamcinolone cream (KENALOG) 0.1 % 1 application Externally once a day for 30 days   No current facility-administered medications for this visit. (Other)   REVIEW OF SYSTEMS: ROS   Positive for: Musculoskeletal, Endocrine, Eyes Negative for: Constitutional, Gastrointestinal, Neurological, Skin, Genitourinary, HENT, Cardiovascular, Respiratory, Psychiatric, Allergic/Imm, Heme/Lymph Last edited by Charlette Caffey, COT on 02/11/2023 12:39 PM.     ALLERGIES Allergies  Allergen Reactions   Augmentin [Amoxicillin-Pot Clavulanate] Swelling and Rash    Swelling in face   Lactose Intolerance (Gi)    Penicillin G     Other Reaction(s): swelling of face   PAST MEDICAL HISTORY Past Medical History:  Diagnosis Date   Allergy    Arthritis of left knee    Asthma    Carotid artery stenosis 10/17/2012   stable report in 2018- "moderate on right, minimal on left"   Carpal tunnel syndrome 2018   left.   Cervical radiculopathy 05/16/2015   DDD C4-C5, C5-C6, and C6-C7.  Facet arthropathy throughout cervical spine.  Normal foraminal narrowing secondary to uncovertebral  arthropathy is seen bilaterally at C3-C4 and C5/C6   Chicken pox    DDD (degenerative disc disease), lumbar 04/26/2015   Mild levoscoliosis, right anterior listhesis of L4 and L5.  DDD at every level.  Facet arthropathy at multiple levels.   Deviated septum    Glaucoma    both eyes, mild opened angle   Heart murmur    History of colon polyps    History of fall    History of frequent urinary tract infections    klebs- pansensitive. c. freundii - resitant to cefazolin and augementin   Hypothyroidism    Intra-abdominal abscess (HCC) 03/10/2020   Iron deficiency anemia    prior pcp told her to take Fe 325 QD   Macular degeneration    h/o retinal edema   Mitral valve  prolapse 08/27/2011   Murmur, cardiac 10/17/2012   Osteoarthritis    Osteoporosis    Pneumonia    Positive TB test    In college    Pseudophakia of both eyes    PVD (peripheral vascular disease) (HCC)    Rheumatic fever    89 years old    Skin lesion 12/31/2021   Syncope 05/17/2017   Thyroid disease    Urinary incontinence    Vertigo    had been prescribed antivert   Vitamin D deficiency    Past Surgical History:  Procedure Laterality Date   CARPAL TUNNEL RELEASE Bilateral 2019   CATARACT EXTRACTION Bilateral 1997   COLONOSCOPY  2010   NASAL SEPTUM SURGERY  2007   WRIST FRACTURE SURGERY Right 1996   FAMILY HISTORY Family History  Problem Relation Age of Onset   Arthritis Mother    COPD Mother    Prostate cancer Father    Arthritis Father    Hearing loss Father    Heart disease Father    Heart attack Father    Stroke Maternal Grandmother    Liver cancer Sister    Arthritis Brother    Prostate cancer Brother    COPD Brother    Heart disease Brother    Breast cancer Daughter    Blindness Maternal Grandfather    Stroke Maternal Grandfather    COPD Paternal Grandmother    Breast cancer Paternal Grandmother    SOCIAL HISTORY Social History   Tobacco Use   Smoking status: Former    Current packs/day: 0.00    Average packs/day: 0.5 packs/day for 13.0 years (6.5 ttl pk-yrs)    Types: Cigarettes    Start date: 7    Quit date: 1965    Years since quitting: 60.0   Smokeless tobacco: Never  Vaping Use   Vaping status: Never Used  Substance Use Topics   Alcohol use: Not Currently    Alcohol/week: 5.0 standard drinks of alcohol    Types: 5 Standard drinks or equivalent per week   Drug use: Never       OPHTHALMIC EXAM:  Base Eye Exam     Visual Acuity (Snellen - Linear)       Right Left   Dist cc 20/40 20/25   Dist ph cc NI NI    Correction: Glasses         Tonometry (Tonopen, 12:41 PM)       Right Left   Pressure 14 15         Pupils        Dark Light Shape React APD   Right 3 2 Round Brisk None   Left 3 2 Round Brisk None         Visual Fields       Left Right    Full Full  Extraocular Movement       Right Left    Full, Ortho Full, Ortho         Neuro/Psych     Oriented x3: Yes   Mood/Affect: Normal         Dilation     Both eyes: 1.0% Mydriacyl, 2.5% Phenylephrine @ 12:39 PM           Slit Lamp and Fundus Exam     External Exam       Right Left   External Normal Normal         Slit Lamp Exam       Right Left   Lids/Lashes Dermatochalasis - upper lid, Telangiectasia Dermatochalasis - upper lid, mild Meibomian gland dysfunction   Conjunctiva/Sclera Temporal Pinguecula White and quiet   Cornea Arcus, 1+ Punctate epithelial erosions, mild tear film debris, fine endo pigment Arcus, 1-2+ inferior Punctate epithelial erosions, mild tear film debris, fine endo pigment   Anterior Chamber Deep and quiet, narrow temporal angle Deep and quiet   Iris Round and dilated Round and dilated, pigmented mass at 0200 and 0730 behind iris, anterior to IOL -- ?regression of 0200 lesion   Lens PC IOL in good position with open PC PC IOL in good position with open PC   Anterior Vitreous Vitreous syneresis Vitreous syneresis, Posterior vitreous detachment         Fundus Exam       Right Left   Disc 360 Peripapillary atrophy, Sharp rim, 2+ Pallor sharp rim, temporal PPA, mild Pallor   C/D Ratio 0.3 0.3   Macula Flat, Blunted foveal reflex, drusen, RPE mottling and clumping, +central IRF / cystic changes -- stably improved, no heme, early RPE atrophy Flat, Blunted foveal reflex, drusen, RPE mottling, clumping and early atrophy, no heme or edema   Vessels attenuated, Tortuous attenuated, Tortuous   Periphery Attached, scattered Reticular degeneration, pigmented cystoid degeneration temporally, No heme Attached, scattered Reticular degeneration, No heme           Refraction     Wearing  Rx       Sphere Cylinder Axis Add   Right -0.75 +2.25 175 +2.75   Left -0.25 +1.25 165 +2.75           IMAGING AND PROCEDURES  Imaging and Procedures for @TODAY @  OCT, Retina - OU - Both Eyes       Right Eye Quality was good. Central Foveal Thickness: 274. Progression has been stable. Findings include normal foveal contour, no IRF, no SRF, retinal drusen , intraretinal hyper-reflective material, epiretinal membrane, outer retinal atrophy (Stable improvement in central cystic changes, Partial PVD; no fluid).   Left Eye Quality was good. Central Foveal Thickness: 296. Progression has been stable. Findings include normal foveal contour, no IRF, no SRF, retinal drusen , pigment epithelial detachment, outer retinal atrophy (Trace ERM, Partial PVD).   Notes *Images captured and stored on drive  Diagnosis / Impression:  OD: Exudative ARMD - stable improvement in central cystic changes, Partial PVD OS: Nonexudative ARMD OS Partial PVD OU  Clinical management:  See below  Abbreviations: NFP - Normal foveal profile. CME - cystoid macular edema. PED - pigment epithelial detachment. IRF - intraretinal fluid. SRF - subretinal fluid. EZ - ellipsoid zone. ERM - epiretinal membrane. ORA - outer retinal atrophy. ORT - outer retinal tubulation. SRHM - subretinal hyper-reflective material       Intravitreal Injection, Pharmacologic Agent - OD - Right Eye  Time Out 02/11/2023. 1:11 PM. Confirmed correct patient, procedure, site, and patient consented.   Anesthesia Topical anesthesia was used. Anesthetic medications included Lidocaine 2%, Proparacaine 0.5%.   Procedure Preparation included 5% betadine to ocular surface, eyelid speculum. A (32g) needle was used.   Injection: 6 mg faricimab-svoa 6 MG/0.05ML   Route: Intravitreal, Site: Right Eye   NDC: 52841-324-40, Lot: [id number], Expiration date: 12/23/2023, Waste: 0 mL   Post-op Post injection exam found visual acuity of at  least counting fingers. The patient tolerated the procedure well. There were no complications. The patient received written and verbal post procedure care education. Post injection medications were not given.            ASSESSMENT/PLAN:    ICD-10-CM   1. Exudative age-related macular degeneration of right eye with active choroidal neovascularization (HCC)  H35.3211 OCT, Retina - OU - Both Eyes    Intravitreal Injection, Pharmacologic Agent - OD - Right Eye    faricimab-svoa (VABYSMO) 6mg /0.77mL intravitreal injection    2. 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 Long-term use of Plaquenil  Z79.899     5. Pseudophakia of both eyes  Z96.1     6. Intermediate stage nonexudative age-related macular degeneration of both eyes  H35.3132     7. Dry eyes  H04.123      1. Exudative age related macular degeneration, OD  - conversion from nonexudative ARMD noted on 9.23.22 visit  - s/p IVA OD #1 (09.23.22), #2 (10.21.22), #3 (11.18.22), #4 (12.30.22), #5 (02.13.23), #6 (03.24.23), #7 (05.08.23), #8 (06.09.23) -- IVA resistance  - s/p IVE OD #1 (07.24.23), #2 (08.25.23), #3 (09.29.23), #4 (10.27.23) -- IVE resistance  - s/p IVV OD #1 (12.01.23), #2 (01.05.34), #3 (02.02.24), #4 (03.01.24), #5 (03.29.24), #6 (04.26.24), #7 (05.24.24), #8 (06.28.24) #9 (08.16.24) #10(10.18.24) - BCVA OD 20/40 -- stable - OCT shows OD: stable improvement in central cystic changes, Partial PVD at 9 weeks - recommend IVV OD #11 today, 12.20.24 w/ f/u ext to 10 wks - pt wishes to be treated with IVV - RBA of procedure discussed, questions answered - Vabysmo informed consent obtained and signed, 12.01.23 - see procedure note - f/u 10 weeks -- DFE/OCT, possible injection   2. Age related macular degeneration, non-exudative OS  - intermediate stage-no change from previous visit - The incidence, anatomy, and pathology of dry AMD, risk  of progression, and the AREDS and AREDS 2 study including smoking risks discussed with patient.   - recommend Amsler grid monitoring  3. Iris/ciliary body mass OS - pigmented lesion behind iris but anterior to PCIOL at 0730 and 0300 - 0730 lesion spans 0630 to 0830 behind dilated iris and larger than 0300 lesion  - patient asymptomatic - pt saw Dr. Haywood Lasso, [facility] on 11.01.22 who dx her with iris margin cyst  4. Plaquenil use for RA  - baseline testing done 05.20.20 -- started plaquenil early May 2020 for RA - HVF 10-2 showed non-specific defects OU -- essentially normal baseline test - OCT shows no obvious plaquenil-related changes, but interval development of exudative ARMD OD as above - currently taking 200mg  / day per Dr. Clarise Cruz instructions --> 3.29 mg/kg body wt per day  - po prednisone, currently as needed which is about once a week - and is also now on 10mg  po MTX weekly + folic acid - the American Academy of  Ophthalmology recommends dosing 5mg /kg per day to reduce risk of retinal toxicity  - recommend consideration of using alternate medication for RA especially given co-morbidity of age-related macular degeneration OU  - managed by Dr[facility] Kathi Ludwig at [facility]  5. Pseudophakia OU - s/p CE/IOL w/ ?canaloplasty OU by Dr. Lavinia Sharps at [facility]  - doing well  - continue to monitor  6. History of Glaucoma   - not currently on gtts -- IOP 14,15 - ?underwent canaloplasty in conjunction with CEIOL at [facility]   - referred to and now under the expert care of Dr. Alben Spittle -- initial Visit/consult 10.3.2019  7. Dry Eye Syndrome  - use AT's OU as needed  Ophthalmic Meds Ordered this visit:  Meds ordered this encounter  Medications   faricimab-svoa (VABYSMO) 6mg /0.77mL intravitreal injection     This document serves as a record of services personally performed by Karie Chimera, MD, PhD. It was created on their behalf by Berlin Hun COT, an ophthalmic technician. The creation of this record is the provider's dictation and/or activities during the visit.    Electronically signed by: Berlin Hun COT 12.18.24  2:21 AM  This document serves as a record of services personally performed by Karie Chimera, MD, PhD. It was created on their behalf by Glee Arvin. Manson Passey, OA an ophthalmic technician. The creation of this record is the provider's dictation and/or activities during the visit.    Electronically signed by: Glee Arvin. Manson Passey, OA 02/12/23 2:21 AM  Karie Chimera, M.D., Ph.D. Diseases & Surgery of the Retina and Vitreous [facility] 02/11/2023    I have reviewed the above documentation for accuracy and completeness, and I agree with the above. Karie Chimera, M.D., Ph.D. 02/12/23 2:22 AM   Abbreviations: M myopia (nearsighted); A astigmatism; H hyperopia (farsighted); P presbyopia; Mrx spectacle prescription;  CTL contact lenses; OD right eye; OS left eye; OU both eyes  XT exotropia; ET esotropia; PEK punctate epithelial keratitis; PEE punctate epithelial erosions; DES dry eye syndrome; MGD meibomian gland dysfunction; ATs artificial tears; PFAT's preservative free artificial tears; NSC nuclear sclerotic cataract; PSC posterior subcapsular cataract; ERM epi-retinal membrane; PVD posterior vitreous detachment; RD retinal detachment; DM diabetes mellitus; DR diabetic retinopathy; NPDR non-proliferative diabetic retinopathy; PDR proliferative diabetic retinopathy; CSME clinically significant macular edema; DME diabetic macular edema; dbh dot blot hemorrhages; CWS cotton wool spot; POAG primary open angle glaucoma; C/D cup-to-disc ratio; HVF humphrey visual field; GVF goldmann visual field; OCT optical coherence tomography; IOP intraocular pressure; BRVO Branch retinal vein occlusion; CRVO central retinal vein occlusion; CRAO central retinal artery occlusion; BRAO branch retinal artery  occlusion; RT retinal tear; SB scleral buckle; PPV pars plana vitrectomy; VH Vitreous hemorrhage; PRP panretinal laser photocoagulation; IVK intravitreal kenalog; VMT vitreomacular traction; MH Macular hole;  NVD neovascularization of the disc; NVE neovascularization elsewhere; AREDS age related eye disease study; ARMD age related macular degeneration; POAG primary open angle glaucoma; EBMD epithelial/anterior basement membrane dystrophy; ACIOL anterior chamber intraocular lens; IOL intraocular lens; PCIOL posterior chamber intraocular lens; Phaco/IOL phacoemulsification with intraocular lens placement; PRK photorefractive keratectomy; LASIK laser assisted in situ keratomileusis; HTN hypertension; DM diabetes mellitus; COPD chronic obstructive pulmonary disease

## 2023-02-11 ENCOUNTER — Ambulatory Visit (INDEPENDENT_AMBULATORY_CARE_PROVIDER_SITE_OTHER): Payer: Medicare HMO | Admitting: Ophthalmology

## 2023-02-11 ENCOUNTER — Encounter (INDEPENDENT_AMBULATORY_CARE_PROVIDER_SITE_OTHER): Payer: Self-pay | Admitting: Ophthalmology

## 2023-02-11 DIAGNOSIS — Z79899 Other long term (current) drug therapy: Secondary | ICD-10-CM | POA: Diagnosis not present

## 2023-02-11 DIAGNOSIS — H353122 Nonexudative age-related macular degeneration, left eye, intermediate dry stage: Secondary | ICD-10-CM

## 2023-02-11 DIAGNOSIS — D4989 Neoplasm of unspecified behavior of other specified sites: Secondary | ICD-10-CM | POA: Diagnosis not present

## 2023-02-11 DIAGNOSIS — H04123 Dry eye syndrome of bilateral lacrimal glands: Secondary | ICD-10-CM

## 2023-02-11 DIAGNOSIS — H353211 Exudative age-related macular degeneration, right eye, with active choroidal neovascularization: Secondary | ICD-10-CM

## 2023-02-11 DIAGNOSIS — H353132 Nonexudative age-related macular degeneration, bilateral, intermediate dry stage: Secondary | ICD-10-CM

## 2023-02-11 DIAGNOSIS — Z961 Presence of intraocular lens: Secondary | ICD-10-CM

## 2023-02-11 MED ORDER — FARICIMAB-SVOA 6 MG/0.05ML IZ SOSY
6.0000 mg | PREFILLED_SYRINGE | INTRAVITREAL | Status: AC | PRN
  Administered 2023-02-11: 6 mg via INTRAVITREAL

## 2023-03-03 ENCOUNTER — Ambulatory Visit: Payer: Self-pay | Admitting: Family Medicine

## 2023-03-03 NOTE — Telephone Encounter (Signed)
 Copied from CRM [id number]. Topic: Clinical - Red Word Triage >> Mar 03, 2023  2:02 PM Taleah C wrote: Red Word that prompted transfer to Nurse Triage: pt called and stated that she has been coughing up mucus and bad diarrhea for several weeks.  Chief Complaint: fever Symptoms: ough yellow mucus, chest and nasal congestion, fever, fatigue, diarrhea  Frequency: for several weeks -  Pertinent Negatives: Patient denies SOB Disposition: [] ED /[] Urgent Care (no appt availability in office) / [x] Appointment(In office/virtual)/ []  [facility]/ [] Home Care/ [] Refused Recommended Disposition /[] [facility]/ []  Follow-up with PCP Additional Notes: pt took imodium to help with diarrhea but stopped taking and it started back and tylenol  for fever.  Reason for Disposition  Cough has been present for > 3 weeks  Answer Assessment - Initial Assessment Questions 1. ONSET: When did the cough begin?      X 1 week 2. SEVERITY: How bad is the cough today?      moderate 3. SPUTUM: Describe the color of your sputum (none, dry cough; clear, white, yellow, green)     yellowish 4. HEMOPTYSIS: Are you coughing up any blood? If so ask: How much? (flecks, streaks, tablespoons, etc.)     no 5. DIFFICULTY BREATHING: Are you having difficulty breathing? If Yes, ask: How bad is it? (e.g., mild, moderate, severe)    - MILD: No SOB at rest, mild SOB with walking, speaks normally in sentences, can lie down, no retractions, pulse < 100.    - MODERATE: SOB at rest, SOB with minimal exertion and prefers to sit, cannot lie down flat, speaks in phrases, mild retractions, audible wheezing, pulse 100-120.    - SEVERE: Very SOB at rest, speaks in single words, struggling to breathe, sitting hunched forward, retractions, pulse > 120      no 6. FEVER: Do you have a fever? If Yes, ask: What is your temperature, how was it measured, and when did it start?     At times feels warm 7. CARDIAC  HISTORY: Do you have any history of heart disease? (e.g., heart attack, congestive heart failure)      N/a 8. LUNG HISTORY: Do you have any history of lung disease?  (e.g., pulmonary embolus, asthma, emphysema)     asthma 9. PE RISK FACTORS: Do you have a history of blood clots? (or: recent major surgery, recent prolonged travel, bedridden)     N/a 10. OTHER SYMPTOMS: Do you have any other symptoms? (e.g., runny nose, wheezing, chest pain)     cough yellow mucus, chest and nasal congestion, fever, fatigue, diarrhea for several weeks - took imodium and tylenol   11. PREGNANCY: Is there any chance you are pregnant? When was your last menstrual period?       N/a 12. TRAVEL: Have you traveled out of the country in the last month? (e.g., travel history, exposures)       N/a  Protocols used: Cough - Acute Productive-A-AH

## 2023-03-03 NOTE — Telephone Encounter (Signed)
 No further action needed at this time.

## 2023-03-04 ENCOUNTER — Ambulatory Visit: Payer: Medicare HMO | Admitting: Family Medicine

## 2023-03-04 VITALS — BP 126/88 | HR 55 | Temp 97.4°F | Wt 117.8 lb

## 2023-03-04 DIAGNOSIS — U071 COVID-19: Secondary | ICD-10-CM

## 2023-03-04 DIAGNOSIS — R6889 Other general symptoms and signs: Secondary | ICD-10-CM

## 2023-03-04 DIAGNOSIS — R197 Diarrhea, unspecified: Secondary | ICD-10-CM | POA: Diagnosis not present

## 2023-03-04 DIAGNOSIS — J329 Chronic sinusitis, unspecified: Secondary | ICD-10-CM | POA: Diagnosis not present

## 2023-03-04 DIAGNOSIS — B9689 Other specified bacterial agents as the cause of diseases classified elsewhere: Secondary | ICD-10-CM | POA: Insufficient documentation

## 2023-03-04 HISTORY — DX: COVID-19: U07.1

## 2023-03-04 LAB — POCT INFLUENZA A/B
Influenza A, POC: NEGATIVE
Influenza B, POC: NEGATIVE

## 2023-03-04 LAB — POC COVID19 BINAXNOW: SARS Coronavirus 2 Ag: POSITIVE — AB

## 2023-03-04 MED ORDER — DOXYCYCLINE HYCLATE 100 MG PO TABS: 100.0000 mg | ORAL_TABLET | Freq: Two times a day (BID) | ORAL | 0 refills | Status: DC

## 2023-03-04 NOTE — Progress Notes (Signed)
 Margaret [name] , 1929-08-25, 89 y.o., female MRN: [id number] Patient Care Team    Relationship Specialty Notifications Start End  Margaret [name] LABOR, DO PCP - General Family Medicine  03/08/19   Margaret Slain, MD PCP - Cardiology Cardiology  04/30/22   Margaret Rogue, MD Consulting Physician Ophthalmology  03/29/18   Margaret JONELLE Ferrari, MD Consulting Physician Ophthalmology  03/29/18   Margaret Tawni CROME, MD Referring Physician Dermatology  08/02/19   Margaret Slough, MD Consulting Physician Rheumatology  12/16/22     Chief Complaint  Patient presents with   Nasal Congestion    1.5-2 weeks   Diarrhea    1 month     Subjective: Margaret [name] is a 89 y.o. Pt presents for an OV with complaints of nasal congestion 7-10 days and diarrhea off and on for 1 month.  Patient endorses having increased fatigue, nasal congestion, mucus production, night sweats.  She denies chills, nausea or vomiting.  Symptoms seem to be waxing waning over the last month, however new onset symptoms started about 7-10 days ago.  Her daughter is with her today and also ill. She has a history of diverticulitis.  She denies abdominal pain.     01/12/2023    3:05 PM 12/16/2022   10:04 AM 07/23/2022   11:16 AM 06/16/2022   10:22 AM 10/24/2021    9:10 AM  Depression screen PHQ 2/9  Decreased Interest 0 0 0 0 0  Down, Depressed, Hopeless 0 0 0 0 0  PHQ - 2 Score 0 0 0 0 0  Altered sleeping 1  1    Tired, decreased energy 1  1    Change in appetite 0  0    Feeling bad or failure about yourself  0  0    Trouble concentrating 0  1    Moving slowly or fidgety/restless 0  0    Suicidal thoughts 0  0    PHQ-9 Score 2  3    Difficult doing work/chores Not difficult at all  Not difficult at all      Allergies  Allergen Reactions   Augmentin  [Amoxicillin -Pot Clavulanate] Swelling and Rash    Swelling in face   Lactose Intolerance (Gi)    Penicillin G     Other Reaction(s): swelling of face   Social History    Social History Narrative   Marital status/children/pets: Widowed.  Moved to [place]  from [place]  [facility] area) 2019.  She shares a home with 1 of her daughters.  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   Cervical radiculopathy 05/16/2015   DDD C4-C5, C5-C6, and C6-C7.  Facet arthropathy throughout cervical spine.  Normal foraminal narrowing secondary to uncovertebral  arthropathy is seen bilaterally at C3-C4 and C5/C6   Chicken pox    DDD (degenerative disc disease), lumbar 04/26/2015   Mild levoscoliosis, right  anterior listhesis of L4 and L5.  DDD at every level.  Facet arthropathy at multiple levels.   Deviated septum    Glaucoma    both eyes, mild opened angle   Heart murmur    History of colon polyps    History of fall    History of frequent urinary tract infections    klebs- pansensitive. c. 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    Pseudophakia of both eyes    PVD (peripheral vascular disease) (HCC)    Rheumatic fever    89 years old    Skin lesion 12/31/2021   Syncope 05/17/2017   Thyroid  disease    Urinary incontinence    Vertigo    had been prescribed antivert   Vitamin D   deficiency    Past Surgical History:  Procedure Laterality Date   CARPAL TUNNEL RELEASE Bilateral 2019   CATARACT EXTRACTION Bilateral 1997   COLONOSCOPY  2010   NASAL SEPTUM SURGERY  2007   WRIST FRACTURE SURGERY Right 1996   Family History  Problem Relation Age of Onset   Arthritis Mother    COPD Mother    Prostate cancer Father    Arthritis Father    Hearing loss Father    Heart disease Father    Heart attack Father    Stroke Maternal Grandmother    Liver cancer Sister    Arthritis Brother    Prostate cancer Brother    COPD Brother    Heart disease Brother    Breast cancer Daughter    Blindness Maternal Grandfather    Stroke Maternal Grandfather    COPD Paternal Grandmother    Breast cancer Paternal Grandmother    Allergies as of 03/04/2023       Reactions   Augmentin  [amoxicillin -pot Clavulanate] Swelling, Rash   Swelling in face   Lactose Intolerance (gi)    Penicillin G    Other Reaction(s): swelling of face        Medication List        Accurate as of March 04, 2023 11:59 PM. If you have any questions, ask your nurse or doctor.          albuterol  108 (90 Base) MCG/ACT inhaler Commonly known as: VENTOLIN  HFA Inhale 2 puffs into the lungs every 6 (six) hours as needed for wheezing or shortness of breath.   colchicine 0.6 MG tablet Take 0.6 mg by mouth daily.   cycloSPORINE  0.05 % ophthalmic emulsion Commonly known as: RESTASIS  Place 1 drop into both eyes 2 (two) times daily.   doxycycline  100 MG tablet Commonly known as: VIBRA -TABS Take 1 tablet (100 mg total) by mouth 2 (two) times daily.   levothyroxine  50 MCG tablet Commonly known as: SYNTHROID  Take 1 tablet (50 mcg total) by mouth daily before breakfast.   montelukast  10 MG tablet Commonly known as: SINGULAIR  Take 1 tablet (10 mg total) by mouth at bedtime.   polyvinyl alcohol  1.4 % ophthalmic solution Commonly known as: LIQUIFILM TEARS Place 1 drop into both eyes as needed for  dry eyes.   predniSONE  5  MG tablet Commonly known as: DELTASONE  Take 5 mg by mouth daily. Every other day   pregabalin  25 MG capsule Commonly known as: Lyrica  Take 1 capsule (25 mg total) by mouth at bedtime.   PreserVision AREDS 2 Caps Take 1 capsule by mouth 2 (two) times daily.   triamcinolone cream 0.1 % Commonly known as: KENALOG 1 application Externally once a day for 30 days        All past medical history, surgical history, allergies, family history, immunizations andmedications were updated in the EMR today and reviewed under the history and medication portions of their EMR.     ROS Negative, with the exception of above mentioned in HPI   Objective:  BP 126/88   Pulse (!) 55   Temp (!) 97.4 F (36.3 C)   Wt 117 lb 12.8 oz (53.4 kg)   SpO2 100%   BMI 23.01 kg/m  Body mass index is 23.01 kg/m. Physical Exam Vitals and nursing note reviewed.  Constitutional:      General: She is not in acute distress.    Appearance: Normal appearance. She is not ill-appearing, toxic-appearing or diaphoretic.  HENT:     Head: Normocephalic and atraumatic.     Right Ear: Tympanic membrane, ear canal and external ear normal.     Left Ear: Tympanic membrane, ear canal and external ear normal.     Nose: Congestion and rhinorrhea present.     Mouth/Throat:     Mouth: Mucous membranes are moist.     Pharynx: No oropharyngeal exudate or posterior oropharyngeal erythema.     Comments: Postnasal drip present Eyes:     General: No scleral icterus.       Right eye: No discharge.        Left eye: No discharge.     Extraocular Movements: Extraocular movements intact.     Conjunctiva/sclera: Conjunctivae normal.     Pupils: Pupils are equal, round, and reactive to light.  Cardiovascular:     Rate and Rhythm: Normal rate and regular rhythm.  Pulmonary:     Effort: Pulmonary effort is normal. No respiratory distress.     Breath sounds: Rhonchi present. No wheezing or rales.   Abdominal:     General: Bowel sounds are normal.     Palpations: Abdomen is soft.     Tenderness: There is no abdominal tenderness.  Musculoskeletal:     Cervical back: Neck supple.     Right lower leg: No edema.     Left lower leg: No edema.  Lymphadenopathy:     Cervical: No cervical adenopathy.  Skin:    General: Skin is warm.     Findings: No rash.  Neurological:     Mental Status: She is alert and oriented to person, place, and time. Mental status is at baseline.     Motor: No weakness.     Gait: Gait normal.  Psychiatric:        Mood and Affect: Mood normal.        Behavior: Behavior normal.        Thought Content: Thought content normal.        Judgment: Judgment normal.      No results found. No results found. No results found for this or any previous visit (from the past 24 hours).   Assessment/Plan: Merl Guardino is a 89 y.o. female present for OV for  Flu-like symptoms (Primary) - POCT Influenza A/B - POC COVID-19 BinaxNow  Diarrhea, unspecified type/COVID-19/Bacterial sinusitis Vital  signs are stable.  Suspect patient has had a viral illness, at least over the last 7 to 10 days.  She has tested positive for COVID-19 today.  Diarrhea may be an extension of the viral illness. -Recommend electrolyte replacement with low sugar Gatorade or like product. - Avoid dairy until diarrhea has completely resolved - Soft bland diet recommended, may advance as tolerated.  Avoiding spicy and greasy foods. - Bentyl  10 mg 3 times daily as needed and nightly. Rest.  Hydrate. Doxycycline  twice daily with food prescribed for sinus symptoms. Follow-up in 1 week if needed, sooner if worsening.  Reviewed expectations re: course of current medical issues. Discussed self-management of symptoms. Outlined signs and symptoms indicating need for more acute intervention. Patient verbalized understanding and all questions were answered. Patient received an After-Visit  Summary.    Orders Placed This Encounter  Procedures   POCT Influenza A/B   POC COVID-19 BinaxNow   Meds ordered this encounter  Medications   doxycycline  (VIBRA -TABS) 100 MG tablet    Sig: Take 1 tablet (100 mg total) by mouth 2 (two) times daily.    Dispense:  20 tablet    Refill:  0   Referral Orders  No referral(s) requested today     Note is dictated utilizing voice recognition software. Although note has been proof read prior to signing, occasional typographical errors still can be missed. If any questions arise, please do not hesitate to call for verification.   electronically signed by:  [name] Bellini, DO  [facility] [place]

## 2023-03-29 ENCOUNTER — Other Ambulatory Visit: Payer: Self-pay

## 2023-03-29 DIAGNOSIS — M2550 Pain in unspecified joint: Secondary | ICD-10-CM

## 2023-04-04 NOTE — Telephone Encounter (Signed)
 Copied from CRM [id number]. Topic: Appointments - Scheduling Inquiry for Clinic >> Apr 04, 2023 10:16 AM Albertha Alosa wrote: Reason for CRM: Patient called in stated she is due for a prolia  shot, would like a call back to be schedule for shot.

## 2023-04-04 NOTE — Telephone Encounter (Signed)
 Pt due next month please complete PA

## 2023-04-08 ENCOUNTER — Ambulatory Visit (INDEPENDENT_AMBULATORY_CARE_PROVIDER_SITE_OTHER): Payer: Medicare HMO

## 2023-04-08 DIAGNOSIS — I350 Nonrheumatic aortic (valve) stenosis: Secondary | ICD-10-CM

## 2023-04-08 LAB — ECHOCARDIOGRAM COMPLETE
AR max vel: 0.79 cm2
AV Area VTI: 0.69 cm2
AV Area mean vel: 0.72 cm2
AV Mean grad: 40 mm[Hg]
AV Peak grad: 61.8 mm[Hg]
Ao pk vel: 3.93 m/s
Area-P 1/2: 1.48 cm2
Radius: 0.6 cm
S' Lateral: 3.13 cm

## 2023-04-18 ENCOUNTER — Encounter (HOSPITAL_BASED_OUTPATIENT_CLINIC_OR_DEPARTMENT_OTHER): Payer: Self-pay | Admitting: Cardiology

## 2023-04-18 ENCOUNTER — Ambulatory Visit (HOSPITAL_BASED_OUTPATIENT_CLINIC_OR_DEPARTMENT_OTHER): Payer: Medicare HMO | Admitting: Cardiology

## 2023-04-18 VITALS — BP 124/72 | HR 70 | Ht 60.0 in | Wt 117.3 lb

## 2023-04-18 DIAGNOSIS — Z712 Person consulting for explanation of examination or test findings: Secondary | ICD-10-CM

## 2023-04-18 DIAGNOSIS — R0609 Other forms of dyspnea: Secondary | ICD-10-CM

## 2023-04-18 DIAGNOSIS — I340 Nonrheumatic mitral (valve) insufficiency: Secondary | ICD-10-CM

## 2023-04-18 DIAGNOSIS — I350 Nonrheumatic aortic (valve) stenosis: Secondary | ICD-10-CM

## 2023-04-18 NOTE — Progress Notes (Signed)
  Cardiology Office Note:  .    Date:  04/18/2023  ID:  Valentino Saxon, DOB 1929-09-13, MRN [id number] PCP: [name], DO  [facility] Providers Cardiologist:  Jodelle Red, MD     History of Present Illness: .    Margaret [name] is a 89 y.o. female with a hx of PMR/RA, aortic stenosis, neuropathy who is seen for follow-up today. I initially met her 04/06/2022 as a new consult at the request of [name], [name], DO for the evaluation and management of aortic stenosis.   CV history: At her initial visit 03/2022, we reviewed her echo results in detail. Noted occasional dull feeling in her chest when she was walking uphill, but it did not stop her. She has a long history of fainting with triggers, ever since childhood. Has fainted less as an adult, last episode July 2023 after cutting her leg on a wall and seeing the blood.   Today: Here with her daughter today. Has been having issues with diarrhea since December, now improving after adjusting medications with her rheumatologist (leflunomide). Has lost weight due to this, about 12-13 lbs. Having burning pain in her medial shins, from knee to feet, L > R. Working with Dr. Claiborne Billings on this.  We reviewed her echo together today. LVEF 55-60%. Aortic valve stenosis is severe, AVA 0.69, Mean gradient 40, V max 3.93 m/s. Moderate MR. RV pressure normal.   Having more shortness of breath going up a hill, occasionally has to stop when going up a hill. No chest pain. No syncope. No leg swelling.   ROS:  Please see the history of present illness. ROS otherwise negative except as noted.   Studies Reviewed: Marland Kitchen         Physical Exam:    VS:  BP 124/72   Pulse 70   Ht 5' (1.524 m)   Wt 117 lb 4.8 oz (53.2 kg)   SpO2 94%   BMI 22.91 kg/m    Wt Readings from Last 3 Encounters:  04/18/23 117 lb 4.8 oz (53.2 kg)  03/04/23 117 lb 12.8 oz (53.4 kg)  12/16/22 126 lb 6.4 oz (57.3 kg)    GEN: Well nourished, well developed in no acute  distress HEENT: Normal, moist mucous membranes NECK: No JVD CARDIAC: regular rhythm, normal S1 and S2, no rubs or gallops. 3/6 mid to late peaking systolic murmur with diminished S2. 2/6 HSM at apex VASCULAR: Radial and DP pulses 2+ bilaterally. No carotid bruits RESPIRATORY:  Clear to auscultation without rales, wheezing or rhonchi  ABDOMEN: Soft, non-tender, non-distended MUSCULOSKELETAL:  Ambulates independently SKIN: Warm and dry, bilateral trivial LE edema NEUROLOGIC:  Alert and oriented x 3. No focal neuro deficits noted. PSYCHIATRIC:  Normal affect    ASSESSMENT AND PLAN: .    Aortic stenosis, severe -now with worsening shortness of breath on exertion and meets severe criteria on echo -reviewed TAVR at length today. She would like to discuss more with the structural team. Referral made today -reviewed symptoms to watch for at length  Murmur -both AS and mild MR murmurs as noted  Dispo: Short term to be seen in structural clinic. Follow-up in 6 months, or sooner as needed.  Signed, Jodelle Red, MD

## 2023-04-18 NOTE — Patient Instructions (Addendum)
 Medication Instructions:  Your physician recommends that you continue on your current medications as directed. Please refer to the Current Medication list given to you today.  Follow-Up: At [facility], you and your health needs are our priority.  As part of our continuing mission to provide you with exceptional heart care, we have created designated Provider Care Teams.  These Care Teams include your primary Cardiologist (physician) and Advanced Practice Providers (APPs -  Physician Assistants and Nurse Practitioners) who all work together to provide you with the care you need, when you need it.  We recommend signing up for the patient portal called "MyChart".  Sign up information is provided on this After Visit Summary.  MyChart is used to connect with patients for Virtual Visits (Telemedicine).  Patients are able to view lab/test results, encounter notes, upcoming appointments, etc.  Non-urgent messages can be sent to your provider as well.   To learn more about what you can do with MyChart, go to [email or web address].    Your next appointment:   6 month(s)  Provider:   Jodelle Red, MD

## 2023-04-20 NOTE — Progress Notes (Signed)
 [facility] - Clinic Note  04/21/2023    CHIEF COMPLAINT Patient presents for Retina Follow Up   HISTORY OF PRESENT ILLNESS: Margaret [name] is a 89 y.o. female who presents to the clinic today for:   HPI     Retina Follow Up   Patient presents with  Wet AMD.  In right eye.  This started 9 weeks ago.  I, the attending physician,  performed the HPI with the patient and updated documentation appropriately.        Comments   Patient feels the near vision is poor, she is having trouble reading even with a magnifying glass. She is using Restasis OU BID and Systane PRN.       Last edited by Rennis Chris, MD on 04/21/2023  4:31 PM.    Pt states she is not seeing well up close, she uses magnifying glasses, but they "drive her crazy", she states she has a valve in her heart that is not working properly   Referring physician: Felix Pacini A, DO [address] [place],   [id number]  HISTORICAL INFORMATION:  Selected notes from the MEDICAL RECORD NUMBER Self referral for macular degeneration LEE: 03.21.19 (Dr. Prentice Docker in [place], [place]) [BCVA: OD: 20/40 OS: 20/40] Ocular Hx-POAG, non-exu ARMD, DES, pseudo, YAG PMH-astham, arthritis,    CURRENT MEDICATIONS: Current Outpatient Medications (Ophthalmic Drugs)  Medication Sig   cycloSPORINE (RESTASIS) 0.05 % ophthalmic emulsion Place 1 drop into both eyes 2 (two) times daily.   polyvinyl alcohol (LIQUIFILM TEARS) 1.4 % ophthalmic solution Place 1 drop into both eyes as needed for dry eyes.   No current facility-administered medications for this visit. (Ophthalmic Drugs)   Current Outpatient Medications (Other)  Medication Sig   albuterol (VENTOLIN HFA) 108 (90 Base) MCG/ACT inhaler Inhale 2 puffs into the lungs every 6 (six) hours as needed for wheezing or shortness of breath.   colchicine 0.6 MG tablet Take 0.6 mg by mouth daily.   leflunomide (ARAVA) 10 MG tablet Take 10 mg by mouth daily.   levothyroxine  (SYNTHROID) 50 MCG tablet Take 1 tablet (50 mcg total) by mouth daily before breakfast.   montelukast (SINGULAIR) 10 MG tablet Take 1 tablet (10 mg total) by mouth at bedtime.   Multiple Vitamins-Minerals (PRESERVISION AREDS 2) CAPS Take 1 capsule by mouth 2 (two) times daily.   predniSONE (DELTASONE) 5 MG tablet Take 5 mg by mouth daily. Every other day   pregabalin (LYRICA) 25 MG capsule Take 1 capsule (25 mg total) by mouth at bedtime.   triamcinolone cream (KENALOG) 0.1 % 1 application Externally once a day for 30 days   No current facility-administered medications for this visit. (Other)   REVIEW OF SYSTEMS: ROS   Positive for: Musculoskeletal, Endocrine, Eyes Negative for: Constitutional, Gastrointestinal, Neurological, Skin, Genitourinary, HENT, Cardiovascular, Respiratory, Psychiatric, Allergic/Imm, Heme/Lymph Last edited by Charlette Caffey, COT on 04/21/2023 12:44 PM.      ALLERGIES Allergies  Allergen Reactions   Augmentin [Amoxicillin-Pot Clavulanate] Swelling and Rash    Swelling in face   Lactose Intolerance (Gi)    Penicillin G     Other Reaction(s): swelling of face   PAST MEDICAL HISTORY Past Medical History:  Diagnosis Date   Allergy    Arthritis of left knee    Asthma    Carotid artery stenosis 10/17/2012   stable report in 2018- "moderate on right, minimal on left"   Carpal tunnel syndrome 2018   left.   Cervical  radiculopathy 05/16/2015   DDD C4-C5, C5-C6, and C6-C7.  Facet arthropathy throughout cervical spine.  Normal foraminal narrowing secondary to uncovertebral  arthropathy is seen bilaterally at C3-C4 and C5/C6   Chicken pox    DDD (degenerative disc disease), lumbar 04/26/2015   Mild levoscoliosis, right anterior listhesis of L4 and L5.  DDD at every level.  Facet arthropathy at multiple levels.   Deviated septum    Glaucoma    both eyes, mild opened angle   Heart murmur    History of colon polyps    History of fall    History of frequent  urinary tract infections    klebs- pansensitive. c. 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    Pseudophakia of both eyes    PVD (peripheral vascular disease) (HCC)    Rheumatic fever    89 years old    Skin lesion 12/31/2021   Syncope 05/17/2017   Thyroid disease    Urinary incontinence    Vertigo    had been prescribed antivert   Vitamin D deficiency    Past Surgical History:  Procedure Laterality Date   CARPAL TUNNEL RELEASE Bilateral 2019   CATARACT EXTRACTION Bilateral 1997   COLONOSCOPY  2010   NASAL SEPTUM SURGERY  2007   WRIST FRACTURE SURGERY Right 1996   FAMILY HISTORY Family History  Problem Relation Age of Onset   Arthritis Mother    COPD Mother    Prostate cancer Father    Arthritis Father    Hearing loss Father    Heart disease Father    Heart attack Father    Stroke Maternal Grandmother    Liver cancer Sister    Arthritis Brother    Prostate cancer Brother    COPD Brother    Heart disease Brother    Breast cancer Daughter    Blindness Maternal Grandfather    Stroke Maternal Grandfather    COPD Paternal Grandmother    Breast cancer Paternal Grandmother    SOCIAL HISTORY Social History   Tobacco Use   Smoking status: Former    Current packs/day: 0.00    Average packs/day: 0.5 packs/day for 13.0 years (6.5 ttl pk-yrs)    Types: Cigarettes    Start date: 69    Quit date: 1965    Years since quitting: 60.1   Smokeless tobacco: Never  Vaping Use   Vaping status: Never Used  Substance Use Topics   Alcohol use: Not Currently    Alcohol/week: 5.0 standard drinks of alcohol    Types: 5 Standard drinks or equivalent per  week   Drug use: Never       OPHTHALMIC EXAM:  Base Eye Exam     Visual Acuity (Snellen - Linear)       Right Left   Dist cc 20/40 20/25   Dist ph cc NI NI    Correction: Glasses         Tonometry (Tonopen, 12:46 PM)       Right Left   Pressure 12 12         Pupils  Dark Light Shape React APD   Right 3 2 Round Brisk None   Left 3 2 Round Brisk None         Visual Fields       Left Right    Full Full         Extraocular Movement       Right Left    Full, Ortho Full, Ortho         Neuro/Psych     Oriented x3: Yes   Mood/Affect: Normal         Dilation     Both eyes: 1.0% Mydriacyl, 2.5% Phenylephrine @ 12:44 PM           Slit Lamp and Fundus Exam     External Exam       Right Left   External Normal Normal         Slit Lamp Exam       Right Left   Lids/Lashes Dermatochalasis - upper lid, Telangiectasia Dermatochalasis - upper lid, mild Meibomian gland dysfunction   Conjunctiva/Sclera Temporal Pinguecula White and quiet   Cornea Arcus, 1+ Punctate epithelial erosions, mild tear film debris, fine endo pigment Arcus, 1-2+ inferior Punctate epithelial erosions, mild tear film debris, fine endo pigment   Anterior Chamber Deep and quiet, narrow temporal angle Deep and quiet   Iris Round and dilated Round and dilated, pigmented mass at 0200 and 0730 behind iris, anterior to IOL -- ?regression of 0200 lesion   Lens PC IOL in good position with open PC PC IOL in good position with open PC   Anterior Vitreous Vitreous syneresis Vitreous syneresis, Posterior vitreous detachment         Fundus Exam       Right Left   Disc 360 Peripapillary atrophy, Sharp rim, 2+ Pallor sharp rim, temporal PPA, mild Pallor   C/D Ratio 0.3 0.3   Macula Flat, Blunted foveal reflex, drusen, RPE mottling and clumping, +central IRF / cystic changes -- stably improved, no heme, early RPE atrophy Flat, Blunted foveal reflex, drusen, RPE mottling,  clumping and early atrophy, no heme or edema   Vessels attenuated, Tortuous attenuated, Tortuous   Periphery Attached, scattered Reticular degeneration, pigmented cystoid degeneration temporally, No heme Attached, scattered Reticular degeneration, No heme           Refraction     Wearing Rx       Sphere Cylinder Axis Add   Right -0.75 +2.25 175 +2.75   Left -0.25 +1.25 165 +2.75           IMAGING AND PROCEDURES  Imaging and Procedures for @TODAY @  OCT, Retina - OU - Both Eyes       Right Eye Quality was good. Central Foveal Thickness: 272. Progression has been stable. Findings include normal foveal contour, no IRF, no SRF, retinal drusen , intraretinal hyper-reflective material, epiretinal membrane, outer retinal atrophy (Stable improvement in central cystic changes, Partial PVD; no fluid).   Left Eye Quality was good. Central Foveal Thickness: 296. Progression has been stable. Findings include normal foveal contour, no IRF, no SRF, retinal drusen , pigment epithelial detachment, outer retinal atrophy (Trace ERM, Partial PVD).   Notes *Images captured and stored on drive  Diagnosis / Impression:  OD: Exudative ARMD - stable improvement in central cystic changes, Partial PVD OS: Nonexudative ARMD OS Partial PVD OU  Clinical management:  See below  Abbreviations: NFP - Normal foveal profile. CME - cystoid macular edema. PED - pigment epithelial  detachment. IRF - intraretinal fluid. SRF - subretinal fluid. EZ - ellipsoid zone. ERM - epiretinal membrane. ORA - outer retinal atrophy. ORT - outer retinal tubulation. SRHM - subretinal hyper-reflective material       Intravitreal Injection, Pharmacologic Agent - OD - Right Eye       Time Out 04/21/2023. 1:22 PM. Confirmed correct patient, procedure, site, and patient consented.   Anesthesia Topical anesthesia was used. Anesthetic medications included Lidocaine 2%, Proparacaine 0.5%.   Procedure Preparation included  5% betadine to ocular surface, eyelid speculum. A supplied (32g) needle was used.   Injection: 6 mg faricimab-svoa 6 MG/0.05ML   Route: Intravitreal, Site: Right Eye   NDC: 16109-604-54, Lot: [id number], Expiration date: 06/21/2024, Waste: 0 mL   Post-op Post injection exam found visual acuity of at least counting fingers. The patient tolerated the procedure well. There were no complications. The patient received written and verbal post procedure care education. Post injection medications were not given.             ASSESSMENT/PLAN:    ICD-10-CM   1. Exudative age-related macular degeneration of right eye with active choroidal neovascularization (HCC)  H35.3211 OCT, Retina - OU - Both Eyes    Intravitreal Injection, Pharmacologic Agent - OD - Right Eye    faricimab-svoa (VABYSMO) 6mg /0.28mL intravitreal injection    2. 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 Long-term use of Plaquenil  Z79.899     5. Pseudophakia of both eyes  Z96.1     6. Primary open angle glaucoma (POAG) of both eyes, mild stage  H40.1131     7. Dry eyes  H04.123       1. Exudative age related macular degeneration, OD  - conversion from nonexudative ARMD noted on 9.23.22 visit  - s/p IVA OD #1 (09.23.22), #2 (10.21.22), #3 (11.18.22), #4 (12.30.22), #5 (02.13.23), #6 (03.24.23), #7 (05.08.23), #8 (06.09.23) -- IVA resistance  - s/p IVE OD #1 (07.24.23), #2 (08.25.23), #3 (09.29.23), #4 (10.27.23) -- IVE resistance  - s/p IVV OD #1 (12.01.23), #2 (01.05.34), #3 (02.02.24), #4 (03.01.24), #5 (03.29.24), #6 (04.26.24), #7 (05.24.24), #8 (06.28.24) #9 (08.16.24) #10(10.18.24) - BCVA OD 20/40 -- stable - OCT shows OD: stable improvement in central cystic changes, Partial PVD at 10weeks - recommend IVV OD #11 today, 12.20.24 w/ f/u ext to 11 wks - pt wishes to be treated with IVV - RBA of procedure discussed, questions answered -  Vabysmo informed consent obtained and signed, 12.01.23 - see procedure note - f/u 11 weeks -- DFE/OCT, possible injection   2. Age related macular degeneration, non-exudative OS  - intermediate stage-no change from previous visit - The incidence, anatomy, and pathology of dry AMD, risk of progression, and the AREDS and AREDS 2 study including smoking risks discussed with patient.   - recommend Amsler grid monitoring  3. Iris/ciliary body mass OS - pigmented lesion behind iris but anterior to PCIOL at 0730 and 0300 - 0730 lesion spans 0630 to 0830 behind dilated iris and larger than 0300 lesion  - patient asymptomatic - pt saw Dr. Haywood Lasso, [facility] on 11.01.22 who dx her with iris margin cyst  4. Plaquenil use for RA  - baseline testing done 05.20.20 -- started plaquenil early May 2020 for RA - HVF 10-2 showed non-specific defects OU -- essentially normal baseline test - OCT shows no obvious plaquenil-related changes, but  interval development of exudative ARMD OD as above - currently taking 200mg  / day per Dr. Clarise Cruz instructions --> 3.29 mg/kg body wt per day  - po prednisone, currently as needed which is about once a week - and is also now on 10mg  po MTX weekly + folic acid - the American Academy of Ophthalmology recommends dosing 5mg /kg per day to reduce risk of retinal toxicity  - recommend consideration of using alternate medication for RA especially given co-morbidity of age-related macular degeneration OU  - managed by Dr. Kathi Ludwig at [facility]  5. Pseudophakia OU - s/p CE/IOL w/ ?canaloplasty OU by Dr[facility] L[facility]ia [facility]ps [facility]  - doing well  - continue to monitor  6. History of Glaucoma   - not currently on gtts -- IOP 12 OU - ?underwent canaloplasty in conjunction with CEIOL at [facility]   - referred to and now under the expert care of Dr. Alben Spittle -- initial Visit/consult 10.3.2019  7. Dry Eye Syndrome  - use AT's OU as  needed  Ophthalmic Meds Ordered this visit:  Meds ordered this encounter  Medications   faricimab-svoa (VABYSMO) 6mg /0.3mL intravitreal injection     This document serves as a record of services personally performed by Karie Chimera, MD, PhD. It was created on their behalf by Annalee Genta, COMT. The creation of this record is the provider's dictation and/or activities during the visit.  Electronically signed by: Annalee Genta, COMT 04/21/23 4:32 PM  This document serves as a record of services personally performed by Karie Chimera, MD, PhD. It was created on their behalf by Glee Arvin. Manson Passey, OA an ophthalmic technician. The creation of this record is the provider's dictation and/or activities during the visit.    Electronically signed by: Glee Arvin. Manson Passey, OA 04/21/23 4:32 PM  Karie Chimera, M.D., Ph.D. Diseases & Surgery of the Retina and Vitreous [facility] 04/21/2023    I have reviewed the above documentation for accuracy and completeness, and I agree with the above. Karie Chimera, M.D., Ph.D. 04/21/23 4:33 PM   Abbreviations: M myopia (nearsighted); A astigmatism; H hyperopia (farsighted); P presbyopia; Mrx spectacle prescription;  CTL contact lenses; OD right eye; OS left eye; OU both eyes  XT exotropia; ET esotropia; PEK punctate epithelial keratitis; PEE punctate epithelial erosions; DES dry eye syndrome; MGD meibomian gland dysfunction; ATs artificial tears; PFAT's preservative free artificial tears; NSC nuclear sclerotic cataract; PSC posterior subcapsular cataract; ERM epi-retinal membrane; PVD posterior vitreous detachment; RD retinal detachment; DM diabetes mellitus; DR diabetic retinopathy; NPDR non-proliferative diabetic retinopathy; PDR proliferative diabetic retinopathy; CSME clinically significant macular edema; DME diabetic macular edema; dbh dot blot hemorrhages; CWS cotton wool spot; POAG primary open angle glaucoma; C/D cup-to-disc ratio; HVF  humphrey visual field; GVF goldmann visual field; OCT optical coherence tomography; IOP intraocular pressure; BRVO Branch retinal vein occlusion; CRVO central retinal vein occlusion; CRAO central retinal artery occlusion; BRAO branch retinal artery occlusion; RT retinal tear; SB scleral buckle; PPV pars plana vitrectomy; VH Vitreous hemorrhage; PRP panretinal laser photocoagulation; IVK intravitreal kenalog; VMT vitreomacular traction; MH Macular hole;  NVD neovascularization of the disc; NVE neovascularization elsewhere; AREDS age related eye disease study; ARMD age related macular degeneration; POAG primary open angle glaucoma; EBMD epithelial/anterior basement membrane dystrophy; ACIOL anterior chamber intraocular lens; IOL intraocular lens; PCIOL posterior chamber intraocular lens; Phaco/IOL phacoemulsification with intraocular lens placement; PRK photorefractive keratectomy; LASIK laser assisted in situ keratomileusis; HTN hypertension; DM diabetes mellitus; COPD chronic  obstructive pulmonary disease

## 2023-04-21 ENCOUNTER — Encounter (INDEPENDENT_AMBULATORY_CARE_PROVIDER_SITE_OTHER): Payer: Self-pay | Admitting: Ophthalmology

## 2023-04-21 ENCOUNTER — Ambulatory Visit (INDEPENDENT_AMBULATORY_CARE_PROVIDER_SITE_OTHER): Payer: Medicare HMO | Admitting: Ophthalmology

## 2023-04-21 DIAGNOSIS — H353122 Nonexudative age-related macular degeneration, left eye, intermediate dry stage: Secondary | ICD-10-CM

## 2023-04-21 DIAGNOSIS — Z79899 Other long term (current) drug therapy: Secondary | ICD-10-CM | POA: Diagnosis not present

## 2023-04-21 DIAGNOSIS — Z961 Presence of intraocular lens: Secondary | ICD-10-CM

## 2023-04-21 DIAGNOSIS — H353211 Exudative age-related macular degeneration, right eye, with active choroidal neovascularization: Secondary | ICD-10-CM | POA: Diagnosis not present

## 2023-04-21 DIAGNOSIS — D4989 Neoplasm of unspecified behavior of other specified sites: Secondary | ICD-10-CM | POA: Diagnosis not present

## 2023-04-21 DIAGNOSIS — H04123 Dry eye syndrome of bilateral lacrimal glands: Secondary | ICD-10-CM

## 2023-04-21 DIAGNOSIS — H401131 Primary open-angle glaucoma, bilateral, mild stage: Secondary | ICD-10-CM

## 2023-04-21 MED ORDER — FARICIMAB-SVOA 6 MG/0.05ML IZ SOSY
6.0000 mg | PREFILLED_SYRINGE | INTRAVITREAL | Status: AC | PRN
  Administered 2023-04-21: 6 mg via INTRAVITREAL

## 2023-04-22 ENCOUNTER — Encounter (INDEPENDENT_AMBULATORY_CARE_PROVIDER_SITE_OTHER): Payer: Medicare HMO | Admitting: Ophthalmology

## 2023-04-23 NOTE — Progress Notes (Unsigned)
 Patient ID: Margaret [name] MRN: [id number] DOB/AGE: 88-Oct-1931 88 y.o.  Primary Care Physician:[name], Ezequiel Essex, DO Primary Cardiologist: Cristal Deer  CC:  Aortic valvular disease management     FOCUSED PROBLEM LIST:   Aortic stenosis AVA 0.69, MG 40, V-max 3.9 EF 55 to 60%, TTE 2025 EKG sinus rhythm without bundle-branch blocks Diastolic dysfunction Severe LAE, RAE, TTE 2025 MR Moderate with severe MAC TTE 2025 Hypothyroidism Aortic atherosclerosis CT abdomen pelvis 2024 Hyperlipidemia CKD stage II Rheumatoid arthritis On Leflunomide  March 2025: Patient consents to use of AI scribe.***   The patient is a 89 year old female with above listed medical problems referred for recommendations regarding her severe symptomatic aortic stenosis.  Patient was seen by her primary cardiologist recently.  She endorsed increasing shortness of breath with exertion without chest pain or syncope.  An echocardiogram demonstrated progression to severe aortic stenosis.         Past Medical History:  Diagnosis Date   Allergy    Arthritis of left knee    Asthma    Carotid artery stenosis 10/17/2012   stable report in 2018- "moderate on right, minimal on left"   Carpal tunnel syndrome 2018   left.   Cervical radiculopathy 05/16/2015   DDD C4-C5, C5-C6, and C6-C7.  Facet arthropathy throughout cervical spine.  Normal foraminal narrowing secondary to uncovertebral  arthropathy is seen bilaterally at C3-C4 and C5/C6   Chicken pox    DDD (degenerative disc disease), lumbar 04/26/2015   Mild levoscoliosis, right anterior listhesis of L4 and L5.  DDD at every level.  Facet arthropathy at multiple levels.   Deviated septum    Glaucoma    both eyes, mild opened angle   Heart murmur    History of colon polyps    History of fall    History of frequent urinary tract infections    klebs- pansensitive. c. freundii - resitant to cefazolin and augementin   Hypothyroidism    Intra-abdominal  abscess (HCC) 03/10/2020   Iron deficiency anemia    prior pcp told her to take Fe 325 QD   Macular degeneration    h/o retinal edema   Mitral valve prolapse 08/27/2011   Murmur, cardiac 10/17/2012   Osteoarthritis    Osteoporosis    Pneumonia    Positive TB test    In college    Pseudophakia of both eyes    PVD (peripheral vascular disease) (HCC)    Rheumatic fever    89 years old    Skin lesion 12/31/2021   Syncope 05/17/2017   Thyroid disease    Urinary incontinence    Vertigo    had been prescribed antivert   Vitamin D deficiency     Past Surgical History:  Procedure Laterality Date   CARPAL TUNNEL RELEASE Bilateral 2019   CATARACT EXTRACTION Bilateral 1997   COLONOSCOPY  2010   NASAL SEPTUM SURGERY  2007   WRIST FRACTURE SURGERY Right 1996    Family History  Problem Relation Age of Onset   Arthritis Mother    COPD Mother    Prostate cancer Father    Arthritis Father    Hearing loss Father    Heart disease Father    Heart attack Father    Stroke Maternal Grandmother    Liver cancer Sister    Arthritis Brother    Prostate cancer Brother    COPD Brother    Heart disease Brother    Breast cancer Daughter    Blindness Maternal Grandfather  Stroke Maternal Grandfather    COPD Paternal Grandmother    Breast cancer Paternal Grandmother     Social History   Socioeconomic History   Marital status: Widowed    Spouse name: Not on file   Number of children: 3   Years of education: Not on file   Highest education level: Not on file  Occupational History   Occupation: retired  Tobacco Use   Smoking status: Former    Current packs/day: 0.00    Average packs/day: 0.5 packs/day for 13.0 years (6.5 ttl pk-yrs)    Types: Cigarettes    Start date: 55    Quit date: 1965    Years since quitting: 60.2   Smokeless tobacco: Never  Vaping Use   Vaping status: Never Used  Substance and Sexual Activity   Alcohol use: Not Currently    Alcohol/week: 5.0 standard  drinks of alcohol    Types: 5 Standard drinks or equivalent per week   Drug use: Never   Sexual activity: Not Currently    Partners: Male  Other Topics Concern   Not on file  Social History Narrative   Marital status/children/pets: Widowed.  Moved to [place] from [name] area) 2019.  She shares a home with 1 of her daughters.  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, retired Engineer, agricultural:      -smoke alarm in the home:Yes     - wears seatbelt: Yes     - Feels safe in their relationships: Yes      2 caffeinated beverages a day no alcohol tobacco or drug use   Social Drivers of Corporate investment banker Strain: Low Risk  (10/24/2021)   Overall Financial Resource Strain (CARDIA)    Difficulty of Paying Living Expenses: Not hard at all  Food Insecurity: No Food Insecurity (01/12/2023)   Hunger Vital Sign    Worried About Running Out of Food in the Last Year: Never true    Ran Out of Food in the Last Year: Never true  Transportation Needs: No Transportation Needs (01/12/2023)   PRAPARE - Administrator, Civil Service (Medical): No    Lack of Transportation (Non-Medical): No  Physical Activity: Inactive (01/12/2023)   Exercise Vital Sign    Days of Exercise per Week: 0 days    Minutes of Exercise per Session: 0 min  Stress: No Stress Concern Present (01/12/2023)   [employer] of Occupational Health - Occupational Stress Questionnaire    Feeling of Stress : Not at all  Social Connections: Socially Isolated (01/12/2023)   Social Connection and Isolation Panel [NHANES]    Frequency of Communication with Friends and Family: Twice a week    Frequency of Social Gatherings with Friends and Family: More than three times a week    Attends Religious Services: Never    Database administrator or Organizations: No    Attends Banker Meetings: Not on file    Marital Status: Widowed  Intimate Partner Violence:  Not At Risk (01/12/2023)   Humiliation, Afraid, Rape, and Kick questionnaire    Fear of Current or Ex-Partner: No    Emotionally Abused: No    Physically Abused: No    Sexually Abused: No     Prior to Admission medications   Medication Sig Start Date End Date Taking? Authorizing Provider  albuterol (VENTOLIN HFA) 108 (90 Base) MCG/ACT inhaler Inhale 2 puffs into the lungs every  6 (six) hours as needed for wheezing or shortness of breath. 06/16/22   [name], [name], DO  colchicine 0.6 MG tablet Take 0.6 mg by mouth daily. 11/25/22   [provider]  cycloSPORINE (RESTASIS) 0.05 % ophthalmic emulsion Place 1 drop into both eyes 2 (two) times daily. 06/05/21   [provider]  leflunomide (ARAVA) 10 MG tablet Take 10 mg by mouth daily.    [provider]  levothyroxine (SYNTHROID) 50 MCG tablet Take 1 tablet (50 mcg total) by mouth daily before breakfast. 06/17/22   [name], [name], DO  montelukast (SINGULAIR) 10 MG tablet Take 1 tablet (10 mg total) by mouth at bedtime. 12/16/22   [name], [name], DO  Multiple Vitamins-Minerals (PRESERVISION AREDS 2) CAPS Take 1 capsule by mouth 2 (two) times daily.    [provider]  polyvinyl alcohol (LIQUIFILM TEARS) 1.4 % ophthalmic solution Place 1 drop into both eyes as needed for dry eyes.    [provider]  predniSONE (DELTASONE) 5 MG tablet Take 5 mg by mouth daily. Every other day 11/27/22   [provider]  pregabalin (LYRICA) 25 MG capsule Take 1 capsule (25 mg total) by mouth at bedtime. 12/16/22   [name], [name], DO  triamcinolone cream (KENALOG) 0.1 % 1 application Externally once a day for 30 days 10/15/22   [provider]    Allergies  Allergen Reactions   Augmentin [Amoxicillin-Pot Clavulanate] Swelling and Rash    Swelling in face   Lactose Intolerance (Gi)    Penicillin G     Other Reaction(s): swelling of face    REVIEW OF SYSTEMS:  General: no fevers/chills/night  sweats Eyes: no blurry vision, diplopia, or amaurosis ENT: no sore throat or hearing loss Resp: no cough, wheezing, or hemoptysis CV: no edema or palpitations GI: no abdominal pain, nausea, vomiting, diarrhea, or constipation GU: no dysuria, frequency, or hematuria Skin: no rash Neuro: no headache, numbness, tingling, or weakness of extremities Musculoskeletal: no joint pain or swelling Heme: no bleeding, DVT, or easy bruising Endo: no polydipsia or polyuria  There were no vitals taken for this visit.  PHYSICAL EXAM: GEN:  AO x 3 in no acute distress HEENT: normal Dentition: Normal*** Neck: JVP normal. +2***carotid upstrokes without bruits. No thyromegaly. Lungs: equal expansion, clear bilaterally CV: Apex is discrete and nondisplaced, RRR without murmur or gallop*** Abd: soft, non-tender, non-distended; no bruit; positive bowel sounds Ext: no edema, ecchymoses, or cyanosis Vascular: 2+ femoral pulses, 2+ radial pulses       Skin: warm and dry without rash Neuro: CN II-XII grossly intact; motor and sensory grossly intact    DATA AND STUDIES:  EKG: EKG from June 2024 demonstrates sinus rhythm without bundle-branch blocks  EKG Interpretation Date/Time:    Ventricular Rate:    PR Interval:    QRS Duration:    QT Interval:    QTC Calculation:   R Axis:      Text Interpretation:          Cardiac Studies & Procedures   ______________________________________________________________________________________________     ECHOCARDIOGRAM  ECHOCARDIOGRAM COMPLETE 04/08/2023  Narrative ECHOCARDIOGRAM REPORT    Patient Name:   DARIELLE HANCHER Date of Exam: 04/08/2023 Medical Rec #:  [id number]      Height:       60.0 in Accession #:    [id number]     Weight:       117.8 lb Date of Birth:  1930-01-16  BSA:          1.491 m Patient Age:    89 years       BP:           118/78 mmHg Patient Gender: F              HR:           73 bpm. Exam Location:   Outpatient  Procedure: 2D Echo, 3D Echo, Cardiac Doppler and Color Doppler (Both Spectral and Color Flow Doppler were utilized during procedure).  Indications:    R06.9 DOE  History:        Patient has prior history of Echocardiogram examinations, most recent 02/04/2022. PAD, Aortic Valve Disease and Mitral Valve Disease, Signs/Symptoms:Dyspnea; Risk Factors:Former Smoker. Patient denies chest pain and leg edema. She has experienced increased DOE while walking up hills.  Sonographer:    Carlos American RVT, RDCS (AE), RDMS Referring Phys: [phone] BRIDGETTE CHRISTOPHER  IMPRESSIONS   1. The aortic valve is tricuspid. There is severe calcifcation of the aortic valve. There is severe thickening of the aortic valve. Aortic valve regurgitation is trivial. Severe aortic valve stenosis. Aortic valve area, by VTI measures 0.69 cm. Aortic valve mean gradient measures 40.0 mmHg. Aortic valve Vmax measures 3.93 m/s. 2. Left ventricular ejection fraction, by estimation, is 55 to 60%. The left ventricle has normal function. The left ventricle has no regional wall motion abnormalities. There is moderate asymmetric left ventricular hypertrophy of the basal-septal segment. Left ventricular diastolic function could not be evaluated. 3. Right ventricular systolic function is low normal. The right ventricular size is normal. There is normal pulmonary artery systolic pressure. The estimated right ventricular systolic pressure is 26.8 mmHg. 4. Left atrial size was severely dilated. 5. Right atrial size was mildly dilated. 6. The mitral valve is degenerative. Moderate mitral valve regurgitation. Mild mitral stenosis. The mean mitral valve gradient is 4.0 mmHg with average heart rate of 57 bpm. Severe mitral annular calcification. 7. The inferior vena cava is normal in size with greater than 50% respiratory variability, suggesting right atrial pressure of 3 mmHg.  Comparison(s): Changes from prior study are  noted. Severe AS is notw present.  FINDINGS Left Ventricle: Left ventricular ejection fraction, by estimation, is 55 to 60%. The left ventricle has normal function. The left ventricle has no regional wall motion abnormalities. Strain imaging was not performed. 3D ejection fraction reviewed and evaluated as part of the interpretation. Alternate measurement of EF is felt to be most reflective of LV function. The left ventricular internal cavity size was normal in size. There is moderate asymmetric left ventricular hypertrophy of the basal-septal segment. Left ventricular diastolic function could not be evaluated due to mitral annular calcification (moderate or greater). Left ventricular diastolic function could not be evaluated.  Right Ventricle: The right ventricular size is normal. No increase in right ventricular wall thickness. Right ventricular systolic function is low normal. There is normal pulmonary artery systolic pressure. The tricuspid regurgitant velocity is 2.44 m/s, and with an assumed right atrial pressure of 3 mmHg, the estimated right ventricular systolic pressure is 26.8 mmHg.  Left Atrium: Left atrial size was severely dilated.  Right Atrium: Right atrial size was mildly dilated.  Pericardium: There is no evidence of pericardial effusion.  Mitral Valve: The mitral valve is degenerative in appearance. Severe mitral annular calcification. Moderate mitral valve regurgitation. Mild mitral valve stenosis. Mitral valve area by planimetry is 1.65 cm, and by the pressure half-time method is calculated  to be 1.48 cm. The mean transmitral gradient is 4.0 mmHg. MV peak gradient, 13.0 mmHg. The mean mitral valve gradient is 4.0 mmHg with average heart rate of 57 bpm.  Tricuspid Valve: The tricuspid valve is grossly normal. Tricuspid valve regurgitation is mild . No evidence of tricuspid stenosis.  Aortic Valve: The aortic valve is tricuspid. There is severe calcifcation of the aortic  valve. There is severe thickening of the aortic valve. Aortic valve regurgitation is trivial. Severe aortic stenosis is present. Aortic valve mean gradient measures 40.0 mmHg. Aortic valve peak gradient measures 61.8 mmHg. Aortic valve area, by VTI measures 0.69 cm.  Pulmonic Valve: The pulmonic valve was grossly normal. Pulmonic valve regurgitation is not visualized. No evidence of pulmonic stenosis.  Aorta: The aortic root and ascending aorta are structurally normal, with no evidence of dilitation.  Venous: The inferior vena cava is normal in size with greater than 50% respiratory variability, suggesting right atrial pressure of 3 mmHg.  IAS/Shunts: The atrial septum is grossly normal.  Additional Comments: 3D was performed not requiring image post processing on an independent workstation and was indeterminate.   LEFT VENTRICLE PLAX 2D LVIDd:         4.50 cm LVIDs:         3.13 cm LV PW:         1.00 cm LV IVS:        1.55 cm LVOT diam:     2.08 cm   3D Volume EF: LV SV:         68        3D EF:        51 % LV SV Index:   45        LV EDV:       122 ml LVOT Area:     3.40 cm  LV ESV:       59 ml LV SV:        63 ml  RIGHT VENTRICLE RV S prime:     11.60 cm/s TAPSE (M-mode): 2.7 cm  LEFT ATRIUM              Index        RIGHT ATRIUM           Index LA diam:        3.60 cm  2.42 cm/m   RA Area:     16.50 cm LA Vol (A2C):   121.0 ml 81.18 ml/m  RA Volume:   46.30 ml  31.06 ml/m LA Vol (A4C):   57.9 ml  38.84 ml/m LA Biplane Vol: 86.9 ml  58.30 ml/m AORTIC VALVE                     PULMONIC VALVE AV Area (Vmax):    0.79 cm      PV Vmax:       1.07 m/s AV Area (Vmean):   0.72 cm      PV Peak grad:  4.6 mmHg AV Area (VTI):     0.69 cm AV Vmax:           393.00 cm/s AV Vmean:          298.667 cm/s AV VTI:            0.982 m AV Peak Grad:      61.8 mmHg AV Mean Grad:      40.0 mmHg LVOT Vmax:         91.80  cm/s LVOT Vmean:        63.100 cm/s LVOT VTI:          0.199  m LVOT/AV VTI ratio: 0.20  AORTA Ao Root diam: 3.00 cm Ao Asc diam:  3.60 cm Ao Arch diam: 3.3 cm  MITRAL VALVE                TRICUSPID VALVE MV Area (PHT):  1.48 cm    TR Peak grad:   23.8 mmHg MV Area (plan): 1.65 cm    TR Vmax:        244.00 cm/s MV Peak grad:   13.0 mmHg MV Mean grad:   4.0 mmHg    SHUNTS MV Vmax:        1.80 m/s    Systemic VTI:  0.20 m MV Vmean:       90.4 cm/s   Systemic Diam: 2.08 cm MV Decel Time:  511 msec MR PISA:        53.94 cm MR PISA Radius: 0.60 cm MV E velocity: 97.20 cm/s MV A velocity: 146.00 cm/s MV E/A ratio:  0.67  Lennie Odor MD Electronically signed by Lennie Odor MD Signature Date/Time: 04/08/2023/2:03:37 PM    Final          ______________________________________________________________________________________________      06/16/2022: TSH 3.04 08/01/2022: Magnesium 2.0 08/23/2022: ALT 37; Hemoglobin 13.9; Platelets 226 10/01/2022: BUN 16; Creatinine, Ser 0.74; Potassium 3.9; Sodium 135   STS RISK CALCULATOR: Pending  NHYA CLASS: ***    ASSESSMENT AND PLAN:   1. Nonrheumatic aortic valve stenosis   2. Diastolic dysfunction   3. Nonrheumatic mitral valve regurgitation   4. Aortic atherosclerosis (HCC)   5. Hyperlipidemia LDL goal <70   6. CKD (chronic kidney disease) stage 2, GFR 60-89 ml/min   7. Seronegative rheumatoid arthritis (HCC)     Aortic stenosis:*** Diastolic dysfunction: Consider optimization of goal-directed medical therapy after potential treatment for aortic stenosis if intervention is pursued. Mitral regurgitation: Moderate likely due to MAC.  Monitor for now. Aortic atherosclerosis: Seen incidentally on previous CT scan.  Would defer intensive lipid-lowering in this patient of advanced age. Hyperlipidemia: By virtue of presence of aortic atherosclerosis goal LDL is less than 70 but would defer intensive lipid-lowering in this patient of advanced age.  CKD stage II: Monitor for now. Rheumatoid  arthritis: Continue leflunomide 10 mg daily.   I have personally reviewed the patients imaging data as summarized above.  I have reviewed the natural history of aortic stenosis with the patient and family members who are present today. We have discussed the limitations of medical therapy and the poor prognosis associated with symptomatic aortic stenosis. We have also reviewed potential treatment options, including palliative medical therapy, conventional surgical aortic valve replacement, and transcatheter aortic valve replacement. We discussed treatment options in the context of this patient's specific comorbid medical conditions.   All of the patient's questions were answered today. Will make further recommendations based on the results of studies outlined above.   I spent *** minutes reviewing all clinical data during and prior to this visit including all relevant imaging studies, laboratories, clinical information from other health systems and prior notes from both Cardiology and other specialties, interviewing the patient, conducting a complete physical examination, and coordinating care in order to formulate a comprehensive and personalized evaluation and treatment plan.   Orbie Pyo, MD  04/23/2023 5:55 AM    [facility] [address] [place], [place],  [place]  [address] Phone: [phone]; Fax: [phone]

## 2023-04-23 NOTE — H&P (View-Only) (Signed)
 Patient ID: Margaret [name] MRN: [id number] DOB/AGE: [birth year] 89 y.o.  Primary Care Physician:[name], Ezequiel Essex, DO Primary Cardiologist: Cristal Deer  CC:  Aortic valvular disease management     FOCUSED PROBLEM LIST:   Aortic stenosis AVA 0.69, MG 40, V-max 3.9 EF 55 to 60%, TTE 2025 EKG sinus rhythm without bundle-branch blocks Diastolic dysfunction Severe LAE, RAE, TTE 2025 MR Moderate with severe MAC TTE 2025 Hypothyroidism Aortic atherosclerosis CT abdomen pelvis 2024 Hyperlipidemia CKD stage II Rheumatoid arthritis On Leflunomide  March 2025: Patient consents to use of AI scribe.   The patient is a 89 year old female with above listed medical problems referred for recommendations regarding her severe symptomatic aortic stenosis.  Patient was seen by her primary cardiologist recently.  She endorsed increasing shortness of breath with exertion without chest pain or syncope.  An echocardiogram demonstrated progression to severe aortic stenosis.  She experiences shortness of breath and chest discomfort, particularly when walking up a steep incline near her home. A year ago, she could walk up the hill without difficulty, but now she has to stop due to shortness of breath. No shortness of breath at rest, while brushing her teeth, washing her face, or taking a shower. No lightheadedness, syncope, or orthopnea. She has noticed an erratic heartbeat at night.  She has a history of a heart murmur since she was around 89 years old. An echocardiogram revealed a worn heart valve, initially investigated due to her shortness of breath.  She has rheumatoid arthritis affecting her left knee and hip and is currently on leflunomide. She experiences leg soreness, particularly in the muscle that runs up her leg, and describes leg aches, burning, and sensations in her feet and lower legs, which she attributes to neuropathy.  She has hypothyroidism and is on thyroid medication. No history of  stroke, smoking, or significant bleeding issues, although she bruises easily.  She lives independently but close to her daughter. She is a former Runner, broadcasting/film/video and a non-smoker.        Past Medical History:  Diagnosis Date   Allergy    Arthritis of left knee    Asthma    Carotid artery stenosis 10/17/2012   stable report in 2018- "moderate on right, minimal on left"   Carpal tunnel syndrome 2018   left.   Cervical radiculopathy 05/16/2015   DDD C4-C5, C5-C6, and C6-C7.  Facet arthropathy throughout cervical spine.  Normal foraminal narrowing secondary to uncovertebral  arthropathy is seen bilaterally at C3-C4 and C5/C6   Chicken pox    DDD (degenerative disc disease), lumbar 04/26/2015   Mild levoscoliosis, right anterior listhesis of L4 and L5.  DDD at every level.  Facet arthropathy at multiple levels.   Deviated septum    Glaucoma    both eyes, mild opened angle   Heart murmur    History of colon polyps    History of fall    History of frequent urinary tract infections    klebs- pansensitive. c. 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    Pseudophakia of both eyes    PVD (peripheral  vascular disease) (HCC)    Rheumatic fever    89 years old    Skin lesion 12/31/2021   Syncope 05/17/2017   Thyroid disease    Urinary incontinence    Vertigo    had been prescribed antivert   Vitamin D deficiency     Past Surgical History:  Procedure Laterality Date   CARPAL TUNNEL RELEASE Bilateral 2019   CATARACT EXTRACTION Bilateral 1997   COLONOSCOPY  2010   NASAL SEPTUM SURGERY  2007   WRIST FRACTURE SURGERY Right 1996    Family History  Problem Relation  Age of Onset   Arthritis Mother    COPD Mother    Prostate cancer Father    Arthritis Father    Hearing loss Father    Heart disease Father    Heart attack Father    Stroke Maternal Grandmother    Liver cancer Sister    Arthritis Brother    Prostate cancer Brother    COPD Brother    Heart disease Brother    Breast cancer Daughter    Blindness Maternal Grandfather    Stroke Maternal Grandfather    COPD Paternal Grandmother    Breast cancer Paternal Grandmother     Social History   Socioeconomic History   Marital status: Widowed    Spouse name: Not on file   Number of children: 3   Years of education: Not on file   Highest education level: Not on file  Occupational History   Occupation: retired  Tobacco Use   Smoking status: Former    Current packs/day: 0.00    Average packs/day: 0.5 packs/day for 13.0 years (6.5 ttl pk-yrs)    Types: Cigarettes    Start date: 40    Quit date: 1965    Years since quitting: 60.2   Smokeless tobacco: Never  Vaping Use   Vaping status: Never Used  Substance and Sexual Activity   Alcohol use: Not Currently    Alcohol/week: 5.0 standard drinks of alcohol    Types: 5 Standard drinks or equivalent per week   Drug use: Never   Sexual activity: Not Currently    Partners: Male  Other Topics Concern   Not on file  Social History Narrative   Marital status/children/pets: Widowed.  Moved to [place] from [place] area) 2019.  She shares a home with 1 of her daughters.  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, retired Engineer, agricultural:      -smoke alarm in the home:Yes     - wears seatbelt: Yes     - Feels safe in their relationships: Yes      2 caffeinated beverages a day no alcohol tobacco or drug use   Social Drivers of Corporate investment banker Strain: Low Risk  (10/24/2021)   Overall Financial Resource Strain (CARDIA)    Difficulty of Paying Living Expenses: Not hard at all  Food  Insecurity: No Food Insecurity (01/12/2023)   Hunger Vital Sign    Worried About Running Out of Food in the Last Year: Never true    Ran Out of Food in the Last Year: Never true  Transportation Needs: No Transportation Needs (01/12/2023)   PRAPARE - Administrator, Civil Service (Medical): No    Lack of Transportation (Non-Medical): No  Physical Activity: Inactive (01/12/2023)   Exercise Vital Sign    Days of Exercise per Week: 0 days    Minutes  of Exercise per Session: 0 min  Stress: No Stress Concern Present (01/12/2023)   [employer] of Occupational Health - Occupational Stress Questionnaire    Feeling of Stress : Not at all  Social Connections: Socially Isolated (01/12/2023)   Social Connection and Isolation Panel [NHANES]    Frequency of Communication with Friends and Family: Twice a week    Frequency of Social Gatherings with Friends and Family: More than three times a week    Attends Religious Services: Never    Database administrator or Organizations: No    Attends Engineer, structural: Not on file    Marital Status: Widowed  Intimate Partner Violence: Not At Risk (01/12/2023)   Humiliation, Afraid, Rape, and Kick questionnaire    Fear of Current or Ex-Partner: No    Emotionally Abused: No    Physically Abused: No    Sexually Abused: No     Prior to Admission medications   Medication Sig Start Date End Date Taking? Authorizing Provider  albuterol (VENTOLIN HFA) 108 (90 Base) MCG/ACT inhaler Inhale 2 puffs into the lungs every 6 (six) hours as needed for wheezing or shortness of breath. 06/16/22   [name], [name], DO  colchicine 0.6 MG tablet Take 0.6 mg by mouth daily. 11/25/22   [provider]  cycloSPORINE (RESTASIS) 0.05 % ophthalmic emulsion Place 1 drop into both eyes 2 (two) times daily. 06/05/21   [provider]  leflunomide (ARAVA) 10 MG tablet Take 10 mg by mouth daily.    [provider]  levothyroxine  (SYNTHROID) 50 MCG tablet Take 1 tablet (50 mcg total) by mouth daily before breakfast. 06/17/22   [name], [name], DO  montelukast (SINGULAIR) 10 MG tablet Take 1 tablet (10 mg total) by mouth at bedtime. 12/16/22   [name], [name], DO  Multiple Vitamins-Minerals (PRESERVISION AREDS 2) CAPS Take 1 capsule by mouth 2 (two) times daily.    [provider]  polyvinyl alcohol (LIQUIFILM TEARS) 1.4 % ophthalmic solution Place 1 drop into both eyes as needed for dry eyes.    [provider]  predniSONE (DELTASONE) 5 MG tablet Take 5 mg by mouth daily. Every other day 11/27/22   [provider]  pregabalin (LYRICA) 25 MG capsule Take 1 capsule (25 mg total) by mouth at bedtime. 12/16/22   [name], [name], DO  triamcinolone cream (KENALOG) 0.1 % 1 application Externally once a day for 30 days 10/15/22   [provider]    Allergies  Allergen Reactions   Augmentin [Amoxicillin-Pot Clavulanate] Swelling and Rash    Swelling in face   Lactose Intolerance (Gi)    Penicillin G     Other Reaction(s): swelling of face    REVIEW OF SYSTEMS:  General: no fevers/chills/night sweats Eyes: no blurry vision, diplopia, or amaurosis ENT: no sore throat or hearing loss Resp: no cough, wheezing, or hemoptysis CV: no edema or palpitations GI: no abdominal pain, nausea, vomiting, diarrhea, or constipation GU: no dysuria, frequency, or hematuria Skin: no rash Neuro: no headache, numbness, tingling, or weakness of extremities Musculoskeletal: no joint pain or swelling Heme: no bleeding, DVT, or easy bruising Endo: no polydipsia or polyuria  BP (!) 152/100   Pulse 72   Ht 5' (1.524 m)   Wt 114 lb 3.2 oz (51.8 kg)   SpO2 95%   BMI 22.30 kg/m   PHYSICAL EXAM: GEN:  AO x 3 in no acute distress HEENT: normal Dentition: Normal Neck: JVP normal. +  2carotid upstrokes without bruits. No thyromegaly. Lungs: equal expansion, clear bilaterally CV: Apex is discrete and  nondisplaced, RRR with 3 out of 6 crescendo decrescendo murmur Abd: soft, non-tender, non-distended; no bruit; positive bowel sounds Ext: no edema, ecchymoses, or cyanosis Vascular: 2+ femoral pulses, 2+ radial pulses       Skin: warm and dry without rash Neuro: CN II-XII grossly intact; motor and sensory grossly intact    DATA AND STUDIES:  EKG: EKG from June 2024 demonstrates sinus rhythm without bundle-branch blocks  EKG Interpretation Date/Time:  Monday April 25 2023 11:06:14 EST Ventricular Rate:  82 PR Interval:  180 QRS Duration:  78 QT Interval:  394 QTC Calculation: 460 R Axis:   58  Text Interpretation: Sinus rhythm with sinus arrhythmia with occasional Premature ventricular complexes Biatrial enlargement Nonspecific ST abnormality When compared with ECG of 28-Jul-2022 13:35, PREVIOUS ECG IS PRESENT Confirmed by Alverda Skeans ([id number]) on 04/25/2023 11:25:30 AM        Cardiac Studies & Procedures   ______________________________________________________________________________________________     ECHOCARDIOGRAM  ECHOCARDIOGRAM COMPLETE 04/08/2023  Narrative ECHOCARDIOGRAM REPORT    Patient Name:   Margaret [name] Date of Exam: 04/08/2023 Medical Rec #:  [id number]      Height:       60.0 in Accession #:    [id number]     Weight:       117.8 lb Date of Birth:  [birth year]      BSA:          1.491 m Patient Age:    89 years       BP:           118/78 mmHg Patient Gender: F              HR:           73 bpm. Exam Location:  Outpatient  Procedure: 2D Echo, 3D Echo, Cardiac Doppler and Color Doppler (Both Spectral and Color Flow Doppler were utilized during procedure).  Indications:    R06.9 DOE  History:        Patient has prior history of Echocardiogram examinations, most recent 02/04/2022. PAD, Aortic Valve Disease and Mitral Valve Disease, Signs/Symptoms:Dyspnea; Risk Factors:Former Smoker. Patient denies chest pain and leg edema. She has experienced increased  DOE while walking up hills.  Sonographer:    Carlos American RVT, RDCS (AE), RDMS Referring Phys: [phone] BRIDGETTE CHRISTOPHER  IMPRESSIONS   1. The aortic valve is tricuspid. There is severe calcifcation of the aortic valve. There is severe thickening of the aortic valve. Aortic valve regurgitation is trivial. Severe aortic valve stenosis. Aortic valve area, by VTI measures 0.69 cm. Aortic valve mean gradient measures 40.0 mmHg. Aortic valve Vmax measures 3.93 m/s. 2. Left ventricular ejection fraction, by estimation, is 55 to 60%. The left ventricle has normal function. The left ventricle has no regional wall motion abnormalities. There is moderate asymmetric left ventricular hypertrophy of the basal-septal segment. Left ventricular diastolic function could not be evaluated. 3. Right ventricular systolic function is low normal. The right ventricular size is normal. There is normal pulmonary artery systolic pressure. The estimated right ventricular systolic pressure is 26.8 mmHg. 4. Left atrial size was severely dilated. 5. Right atrial size was mildly dilated. 6. The mitral valve is degenerative. Moderate mitral valve regurgitation. Mild mitral stenosis. The mean mitral valve gradient is 4.0 mmHg with average heart rate of 57 bpm. Severe mitral annular calcification. 7. The inferior vena cava is normal in size with  greater than 50% respiratory variability, suggesting right atrial pressure of 3 mmHg.  Comparison(s): Changes from prior study are noted. Severe AS is notw present.  FINDINGS Left Ventricle: Left ventricular ejection fraction, by estimation, is 55 to 60%. The left ventricle has normal function. The left ventricle has no regional wall motion abnormalities. Strain imaging was not performed. 3D ejection fraction reviewed and evaluated as part of the interpretation. Alternate measurement of EF is felt to be most reflective of LV function. The left ventricular internal cavity size was  normal in size. There is moderate asymmetric left ventricular hypertrophy of the basal-septal segment. Left ventricular diastolic function could not be evaluated due to mitral annular calcification (moderate or greater). Left ventricular diastolic function could not be evaluated.  Right Ventricle: The right ventricular size is normal. No increase in right ventricular wall thickness. Right ventricular systolic function is low normal. There is normal pulmonary artery systolic pressure. The tricuspid regurgitant velocity is 2.44 m/s, and with an assumed right atrial pressure of 3 mmHg, the estimated right ventricular systolic pressure is 26.8 mmHg.  Left Atrium: Left atrial size was severely dilated.  Right Atrium: Right atrial size was mildly dilated.  Pericardium: There is no evidence of pericardial effusion.  Mitral Valve: The mitral valve is degenerative in appearance. Severe mitral annular calcification. Moderate mitral valve regurgitation. Mild mitral valve stenosis. Mitral valve area by planimetry is 1.65 cm, and by the pressure half-time method is calculated to be 1.48 cm. The mean transmitral gradient is 4.0 mmHg. MV peak gradient, 13.0 mmHg. The mean mitral valve gradient is 4.0 mmHg with average heart rate of 57 bpm.  Tricuspid Valve: The tricuspid valve is grossly normal. Tricuspid valve regurgitation is mild . No evidence of tricuspid stenosis.  Aortic Valve: The aortic valve is tricuspid. There is severe calcifcation of the aortic valve. There is severe thickening of the aortic valve. Aortic valve regurgitation is trivial. Severe aortic stenosis is present. Aortic valve mean gradient measures 40.0 mmHg. Aortic valve peak gradient measures 61.8 mmHg. Aortic valve area, by VTI measures 0.69 cm.  Pulmonic Valve: The pulmonic valve was grossly normal. Pulmonic valve regurgitation is not visualized. No evidence of pulmonic stenosis.  Aorta: The aortic root and ascending aorta are  structurally normal, with no evidence of dilitation.  Venous: The inferior vena cava is normal in size with greater than 50% respiratory variability, suggesting right atrial pressure of 3 mmHg.  IAS/Shunts: The atrial septum is grossly normal.  Additional Comments: 3D was performed not requiring image post processing on an independent workstation and was indeterminate.   LEFT VENTRICLE PLAX 2D LVIDd:         4.50 cm LVIDs:         3.13 cm LV PW:         1.00 cm LV IVS:        1.55 cm LVOT diam:     2.08 cm   3D Volume EF: LV SV:         68        3D EF:        51 % LV SV Index:   45        LV EDV:       122 ml LVOT Area:     3.40 cm  LV ESV:       59 ml LV SV:        63 ml  RIGHT VENTRICLE RV S prime:     11.60  cm/s TAPSE (M-mode): 2.7 cm  LEFT ATRIUM              Index        RIGHT ATRIUM           Index LA diam:        3.60 cm  2.42 cm/m   RA Area:     16.50 cm LA Vol (A2C):   121.0 ml 81.18 ml/m  RA Volume:   46.30 ml  31.06 ml/m LA Vol (A4C):   57.9 ml  38.84 ml/m LA Biplane Vol: 86.9 ml  58.30 ml/m AORTIC VALVE                     PULMONIC VALVE AV Area (Vmax):    0.79 cm      PV Vmax:       1.07 m/s AV Area (Vmean):   0.72 cm      PV Peak grad:  4.6 mmHg AV Area (VTI):     0.69 cm AV Vmax:           393.00 cm/s AV Vmean:          298.667 cm/s AV VTI:            0.982 m AV Peak Grad:      61.8 mmHg AV Mean Grad:      40.0 mmHg LVOT Vmax:         91.80 cm/s LVOT Vmean:        63.100 cm/s LVOT VTI:          0.199 m LVOT/AV VTI ratio: 0.20  AORTA Ao Root diam: 3.00 cm Ao Asc diam:  3.60 cm Ao Arch diam: 3.3 cm  MITRAL VALVE                TRICUSPID VALVE MV Area (PHT):  1.48 cm    TR Peak grad:   23.8 mmHg MV Area (plan): 1.65 cm    TR Vmax:        244.00 cm/s MV Peak grad:   13.0 mmHg MV Mean grad:   4.0 mmHg    SHUNTS MV Vmax:        1.80 m/s    Systemic VTI:  0.20 m MV Vmean:       90.4 cm/s   Systemic Diam: 2.08 cm MV Decel Time:  511 msec MR  PISA:        53.94 cm MR PISA Radius: 0.60 cm MV E velocity: 97.20 cm/s MV A velocity: 146.00 cm/s MV E/A ratio:  0.67  Lennie Odor MD Electronically signed by Lennie Odor MD Signature Date/Time: 04/08/2023/2:03:37 PM    Final          ______________________________________________________________________________________________      06/16/2022: TSH 3.04 08/01/2022: Magnesium 2.0 08/23/2022: ALT 37; Hemoglobin 13.9; Platelets 226 10/01/2022: BUN 16; Creatinine, Ser 0.74; Potassium 3.9; Sodium 135   STS RISK CALCULATOR: Pending  NHYA CLASS: 2    ASSESSMENT AND PLAN:   1. Nonrheumatic aortic valve stenosis   2. Diastolic dysfunction   3. Nonrheumatic mitral valve regurgitation   4. Aortic atherosclerosis (HCC)   5. Hyperlipidemia LDL goal <70   6. CKD (chronic kidney disease) stage 2, GFR 60-89 ml/min   7. Seronegative rheumatoid arthritis (HCC)   8. Pre-procedure lab exam     Aortic stenosis: The patient has developed severe symptomatic aortic stenosis.  She is of advanced age but she looks much younger than her chronological  age.  She lives independently.  She has become symptomatic.  I believe her symptoms are somewhat masked by her arthritis and limitations springing from such.  I had a long conversation with the patient and her 2 daughters.  I believe it makes sense to proceed with an evaluation.  I think if she did not have TF access then we should perhaps defer TAVR.  The patient and her daughters agree with this plan. Diastolic dysfunction: Consider optimization of goal-directed medical therapy after potential treatment for aortic stenosis if intervention is pursued. Mitral regurgitation: Moderate likely due to MAC.  Monitor for now. Aortic atherosclerosis: Seen incidentally on previous CT scan.  Would defer intensive lipid-lowering in this patient of advanced age. Hyperlipidemia: By virtue of presence of aortic atherosclerosis goal LDL is less than 70 but would  defer intensive lipid-lowering in this patient of advanced age.  CKD stage II: Monitor for now. Rheumatoid arthritis: Continue leflunomide 10 mg daily.   I have personally reviewed the patients imaging data as summarized above.  I have reviewed the natural history of aortic stenosis with the patient and family members who are present today. We have discussed the limitations of medical therapy and the poor prognosis associated with symptomatic aortic stenosis. We have also reviewed potential treatment options, including palliative medical therapy, conventional surgical aortic valve replacement, and transcatheter aortic valve replacement. We discussed treatment options in the context of this patient's specific comorbid medical conditions.   All of the patient's questions were answered today. Will make further recommendations based on the results of studies outlined above.   I spent 52 minutes reviewing all clinical data during and prior to this visit including all relevant imaging studies, laboratories, clinical information from other health systems and prior notes from both Cardiology and other specialties, interviewing the patient, conducting a complete physical examination, and coordinating care in order to formulate a comprehensive and personalized evaluation and treatment plan.   Orbie Pyo, MD  04/25/2023 12:01 PM    [facility] [address] [place], [place], [place]  [address] Phone: [phone]; Fax: [phone]

## 2023-04-25 ENCOUNTER — Ambulatory Visit: Payer: Medicare HMO | Attending: Internal Medicine | Admitting: Internal Medicine

## 2023-04-25 ENCOUNTER — Encounter: Payer: Self-pay | Admitting: Internal Medicine

## 2023-04-25 VITALS — BP 152/100 | HR 72 | Ht 60.0 in | Wt 114.2 lb

## 2023-04-25 DIAGNOSIS — I350 Nonrheumatic aortic (valve) stenosis: Secondary | ICD-10-CM | POA: Diagnosis not present

## 2023-04-25 DIAGNOSIS — I5189 Other ill-defined heart diseases: Secondary | ICD-10-CM | POA: Diagnosis not present

## 2023-04-25 DIAGNOSIS — M0600 Rheumatoid arthritis without rheumatoid factor, unspecified site: Secondary | ICD-10-CM

## 2023-04-25 DIAGNOSIS — I700 Atherosclerosis of aorta: Secondary | ICD-10-CM

## 2023-04-25 DIAGNOSIS — Z01812 Encounter for preprocedural laboratory examination: Secondary | ICD-10-CM

## 2023-04-25 DIAGNOSIS — N182 Chronic kidney disease, stage 2 (mild): Secondary | ICD-10-CM

## 2023-04-25 DIAGNOSIS — I340 Nonrheumatic mitral (valve) insufficiency: Secondary | ICD-10-CM

## 2023-04-25 DIAGNOSIS — E785 Hyperlipidemia, unspecified: Secondary | ICD-10-CM

## 2023-04-25 NOTE — Patient Instructions (Signed)
 Medication Instructions:  Your physician recommends that you continue on your current medications as directed. Please refer to the Current Medication list given to you today.  *If you need a refill on your cardiac medications before your next appointment, please call your pharmacy*  Lab Work: TODAY: CBC, BMET If you have labs (blood work) drawn today and your tests are completely normal, you will receive your results only by: MyChart Message (if you have MyChart) OR A paper copy in the mail If you have any lab test that is abnormal or we need to change your treatment, we will call you to review the results.  Testing/Procedures: Your physician has requested that you have a cardiac catheterization. Cardiac catheterization is used to diagnose and/or treat various heart conditions. Doctors may recommend this procedure for a number of different reasons. The most common reason is to evaluate chest pain. Chest pain can be a symptom of coronary artery disease (CAD), and cardiac catheterization can show whether plaque is narrowing or blocking your heart's arteries. This procedure is also used to evaluate the valves, as well as measure the blood flow and oxygen levels in different parts of your heart. For further information please visit [email or web address] Please follow instruction sheet, as given.   Follow-Up: Will be arranged with structural heart team after your heart catheterization.  Other Instructions     Cardiac/Peripheral Catheterization   You are scheduled for a Cardiac Catheterization on Tuesday, March 4 with Dr. Alverda Skeans.  1. Please arrive at the [facility] (Main Entrance A) at [facility]: [address] [place], [place] [address] at 8:30 AM (This time is 2 hour(s) before your procedure to ensure your preparation).   Free valet parking service is available. You will check in at ADMITTING. The support person will be asked to wait in the waiting room.  It is OK to have  someone drop you off and come back when you are ready to be discharged.        Special note: Every effort is made to have your procedure done on time. Please understand that emergencies sometimes delay scheduled procedures.  2. Diet: Do not eat solid foods after midnight.  You may have clear liquids until 5 AM the day of the procedure.  3. Labs: TODAY (CBC, BMET)  4. Medication instructions in preparation for your procedure:   Contrast Allergy: No  On the morning of your procedure, take Aspirin 81 mg and any morning medicines NOT listed above.  You may use sips of water.  5. Plan to go home the same day, you will only stay overnight if medically necessary. 6. You MUST have a responsible adult to drive you home. 7. An adult MUST be with you the first 24 hours after you arrive home. 8. Bring a current list of your medications, and the last time and date medication taken. 9. Bring ID and current insurance cards. 10.Please wear clothes that are easy to get on and off and wear slip-on shoes.  Thank you for allowing [place] to care for you!   -- [facility]

## 2023-04-25 NOTE — Addendum Note (Signed)
 Addended by: Franchot Gallo on: 04/25/2023 12:13 PM   Modules accepted: Orders

## 2023-04-25 NOTE — Progress Notes (Addendum)
 Pre Surgical Assessment: 5 M Walk Test  52M=16.57ft  5 Meter Walk Test- trial 1: 10.08 seconds 5 Meter Walk Test- trial 2: 8.11 seconds 5 Meter Walk Test- trial 3: 9.57 seconds 5 Meter Walk Test Average: 9.25 seconds  _____________________________  Procedure Type: Isolated AVR Perioperative Outcome Estimate % Operative Mortality 6.86% Morbidity & Mortality 10.2% Stroke 1.84% Renal Failure 0.951% Reoperation 2.79% Prolonged Ventilation 5.4% Deep Sternal Wound Infection 0.039% Long Hospital Stay (>14 days) 5.58% Short Hospital Stay (<6 days)* 27.6%

## 2023-04-26 ENCOUNTER — Ambulatory Visit (HOSPITAL_COMMUNITY)
Admission: RE | Admit: 2023-04-26 | Discharge: 2023-04-26 | Disposition: A | Discharge status: Home / Self Care | Attending: Internal Medicine | Admitting: Internal Medicine

## 2023-04-26 ENCOUNTER — Encounter (HOSPITAL_COMMUNITY)
Admission: RE | Disposition: A | Discharge status: Home / Self Care | Payer: Self-pay | Source: Home / Self Care | Attending: Internal Medicine

## 2023-04-26 ENCOUNTER — Other Ambulatory Visit: Payer: Self-pay

## 2023-04-26 ENCOUNTER — Encounter (HOSPITAL_COMMUNITY): Payer: Self-pay | Admitting: Internal Medicine

## 2023-04-26 ENCOUNTER — Encounter: Payer: Self-pay | Admitting: Internal Medicine

## 2023-04-26 DIAGNOSIS — Z87891 Personal history of nicotine dependence: Secondary | ICD-10-CM | POA: Insufficient documentation

## 2023-04-26 DIAGNOSIS — I700 Atherosclerosis of aorta: Secondary | ICD-10-CM | POA: Insufficient documentation

## 2023-04-26 DIAGNOSIS — I2510 Atherosclerotic heart disease of native coronary artery without angina pectoris: Secondary | ICD-10-CM | POA: Insufficient documentation

## 2023-04-26 DIAGNOSIS — M0600 Rheumatoid arthritis without rheumatoid factor, unspecified site: Secondary | ICD-10-CM | POA: Insufficient documentation

## 2023-04-26 DIAGNOSIS — E785 Hyperlipidemia, unspecified: Secondary | ICD-10-CM | POA: Insufficient documentation

## 2023-04-26 DIAGNOSIS — I350 Nonrheumatic aortic (valve) stenosis: Secondary | ICD-10-CM

## 2023-04-26 DIAGNOSIS — I080 Rheumatic disorders of both mitral and aortic valves: Secondary | ICD-10-CM | POA: Insufficient documentation

## 2023-04-26 DIAGNOSIS — N182 Chronic kidney disease, stage 2 (mild): Secondary | ICD-10-CM | POA: Insufficient documentation

## 2023-04-26 HISTORY — PX: ABDOMINAL AORTOGRAM: CATH118222

## 2023-04-26 HISTORY — PX: RIGHT HEART CATH AND CORONARY ANGIOGRAPHY: CATH118264

## 2023-04-26 LAB — POCT I-STAT EG7
Acid-base deficit: 2 mmol/L (ref 0.0–2.0)
Acid-base deficit: 2 mmol/L (ref 0.0–2.0)
Bicarbonate: 24.2 mmol/L (ref 20.0–28.0)
Bicarbonate: 24.3 mmol/L (ref 20.0–28.0)
Calcium, Ion: 1.02 mmol/L — ABNORMAL LOW (ref 1.15–1.40)
Calcium, Ion: 1.06 mmol/L — ABNORMAL LOW (ref 1.15–1.40)
HCT: 31 % — ABNORMAL LOW (ref 36.0–46.0)
HCT: 32 % — ABNORMAL LOW (ref 36.0–46.0)
Hemoglobin: 10.5 g/dL — ABNORMAL LOW (ref 12.0–15.0)
Hemoglobin: 10.9 g/dL — ABNORMAL LOW (ref 12.0–15.0)
O2 Saturation: 66 %
O2 Saturation: 70 %
Potassium: 3.5 mmol/L (ref 3.5–5.1)
Potassium: 3.6 mmol/L (ref 3.5–5.1)
Sodium: 140 mmol/L (ref 135–145)
Sodium: 142 mmol/L (ref 135–145)
TCO2: 26 mmol/L (ref 22–32)
TCO2: 26 mmol/L (ref 22–32)
pCO2, Ven: 44.8 mmHg (ref 44–60)
pCO2, Ven: 46.1 mmHg (ref 44–60)
pH, Ven: 7.329 (ref 7.25–7.43)
pH, Ven: 7.343 (ref 7.25–7.43)
pO2, Ven: 37 mmHg (ref 32–45)
pO2, Ven: 39 mmHg (ref 32–45)

## 2023-04-26 LAB — POCT I-STAT 7, (LYTES, BLD GAS, ICA,H+H)
Acid-base deficit: 1 mmol/L (ref 0.0–2.0)
Bicarbonate: 23.9 mmol/L (ref 20.0–28.0)
Calcium, Ion: 1.04 mmol/L — ABNORMAL LOW (ref 1.15–1.40)
HCT: 32 % — ABNORMAL LOW (ref 36.0–46.0)
Hemoglobin: 10.9 g/dL — ABNORMAL LOW (ref 12.0–15.0)
O2 Saturation: 95 %
Potassium: 3.7 mmol/L (ref 3.5–5.1)
Sodium: 144 mmol/L (ref 135–145)
TCO2: 25 mmol/L (ref 22–32)
pCO2 arterial: 41.7 mmHg (ref 32–48)
pH, Arterial: 7.367 (ref 7.35–7.45)
pO2, Arterial: 81 mmHg — ABNORMAL LOW (ref 83–108)

## 2023-04-26 LAB — BASIC METABOLIC PANEL
BUN/Creatinine Ratio: 23 (ref 12–28)
BUN: 15 mg/dL (ref 10–36)
CO2: 21 mmol/L (ref 20–29)
Calcium: 8.8 mg/dL (ref 8.7–10.3)
Chloride: 109 mmol/L — ABNORMAL HIGH (ref 96–106)
Creatinine, Ser: 0.65 mg/dL (ref 0.57–1.00)
Glucose: 185 mg/dL — ABNORMAL HIGH (ref 70–99)
Potassium: 4 mmol/L (ref 3.5–5.2)
Sodium: 146 mmol/L — ABNORMAL HIGH (ref 134–144)
eGFR: 83 mL/min/{1.73_m2} (ref >59)

## 2023-04-26 LAB — CBC
Hematocrit: 37.7 % (ref 34.0–46.6)
Hemoglobin: 12.4 g/dL (ref 11.1–15.9)
MCH: 30.5 pg (ref 26.6–33.0)
MCHC: 32.9 g/dL (ref 31.5–35.7)
MCV: 93 fL (ref 79–97)
Platelets: 182 10*3/uL (ref 150–450)
RBC: 4.07 x10E6/uL (ref 3.77–5.28)
RDW: 12.9 % (ref 11.7–15.4)
WBC: 8.9 10*3/uL (ref 3.4–10.8)

## 2023-04-26 SURGERY — RIGHT HEART CATH AND CORONARY ANGIOGRAPHY
Anesthesia: LOCAL

## 2023-04-26 MED ORDER — LIDOCAINE HCL (PF) 1 % IJ SOLN
INTRAMUSCULAR | Status: AC
  Filled 2023-04-26: qty 30

## 2023-04-26 MED ORDER — HEPARIN SODIUM (PORCINE) 1000 UNIT/ML IJ SOLN
INTRAMUSCULAR | Status: AC
  Filled 2023-04-26: qty 10

## 2023-04-26 MED ORDER — SODIUM CHLORIDE 0.9% FLUSH
3.0000 mL | Freq: Two times a day (BID) | INTRAVENOUS | Status: DC
Start: 2023-04-26 — End: 2023-04-26

## 2023-04-26 MED ORDER — MIDAZOLAM HCL 2 MG/2ML IJ SOLN
INTRAMUSCULAR | Status: DC | PRN
  Administered 2023-04-26: 1 mg via INTRAVENOUS

## 2023-04-26 MED ORDER — HEPARIN (PORCINE) IN NACL 1000-0.9 UT/500ML-% IV SOLN
INTRAVENOUS | Status: DC | PRN
  Administered 2023-04-26 (×2): 500 mL via INTRA_ARTERIAL

## 2023-04-26 MED ORDER — ACETAMINOPHEN 325 MG PO TABS: 650.0000 mg | ORAL_TABLET | ORAL | Status: DC | PRN

## 2023-04-26 MED ORDER — ASPIRIN 81 MG PO CHEW
81.0000 mg | CHEWABLE_TABLET | ORAL | Status: AC
  Administered 2023-04-26: 81 mg via ORAL
  Filled 2023-04-26: qty 1

## 2023-04-26 MED ORDER — IOHEXOL 350 MG/ML SOLN
INTRAVENOUS | Status: DC | PRN
  Administered 2023-04-26: 45 mL via INTRA_ARTERIAL

## 2023-04-26 MED ORDER — MIDAZOLAM HCL 2 MG/2ML IJ SOLN
INTRAMUSCULAR | Status: AC
  Filled 2023-04-26: qty 2

## 2023-04-26 MED ORDER — VERAPAMIL HCL 2.5 MG/ML IV SOLN
INTRAVENOUS | Status: AC
Start: 2023-04-26 — End: ?
  Filled 2023-04-26: qty 2

## 2023-04-26 MED ORDER — HEPARIN SODIUM (PORCINE) 1000 UNIT/ML IJ SOLN
INTRAMUSCULAR | Status: DC | PRN
  Administered 2023-04-26: 5000 [IU] via INTRAVENOUS

## 2023-04-26 MED ORDER — SODIUM CHLORIDE 0.9 % IV SOLN: 250.0000 mL | INTRAVENOUS | Status: DC | PRN

## 2023-04-26 MED ORDER — SODIUM CHLORIDE 0.9 % WEIGHT BASED INFUSION: 3.0000 mL/kg/h | INTRAVENOUS | Status: AC

## 2023-04-26 MED ORDER — SODIUM CHLORIDE 0.9 % IV SOLN: INTRAVENOUS | Status: DC

## 2023-04-26 MED ORDER — SODIUM CHLORIDE 0.9 % WEIGHT BASED INFUSION: 1.0000 mL/kg/h | INTRAVENOUS | Status: DC

## 2023-04-26 MED ORDER — VERAPAMIL HCL 2.5 MG/ML IV SOLN
INTRAVENOUS | Status: DC | PRN
  Administered 2023-04-26: 10 mL via INTRA_ARTERIAL

## 2023-04-26 MED ORDER — FENTANYL CITRATE (PF) 100 MCG/2ML IJ SOLN
INTRAMUSCULAR | Status: DC | PRN
  Administered 2023-04-26: 25 ug via INTRAVENOUS

## 2023-04-26 MED ORDER — LIDOCAINE HCL (PF) 1 % IJ SOLN
INTRAMUSCULAR | Status: DC | PRN
  Administered 2023-04-26: 5 mL

## 2023-04-26 MED ORDER — ONDANSETRON HCL 4 MG/2ML IJ SOLN: 4.0000 mg | Freq: Four times a day (QID) | INTRAMUSCULAR | Status: DC | PRN

## 2023-04-26 MED ORDER — HYDRALAZINE HCL 20 MG/ML IJ SOLN: 10.0000 mg | INTRAMUSCULAR | Status: DC | PRN

## 2023-04-26 MED ORDER — LABETALOL HCL 5 MG/ML IV SOLN: 10.0000 mg | INTRAVENOUS | Status: DC | PRN

## 2023-04-26 MED ORDER — FENTANYL CITRATE (PF) 100 MCG/2ML IJ SOLN
INTRAMUSCULAR | Status: AC
  Filled 2023-04-26: qty 2

## 2023-04-26 MED ORDER — SODIUM CHLORIDE 0.9% FLUSH: 3.0000 mL | INTRAVENOUS | Status: DC | PRN

## 2023-04-26 SURGICAL SUPPLY — 10 items
CATH DIAG 6FR PIGTAIL ANGLED (CATHETERS) IMPLANT
CATH INFINITI AMBI 6FR TG (CATHETERS) IMPLANT
CATH SWAN GANZ 7F STRAIGHT (CATHETERS) IMPLANT
DEVICE RAD TR BAND REGULAR (VASCULAR PRODUCTS) IMPLANT
GLIDESHEATH SLEND SS 6F .021 (SHEATH) IMPLANT
GLIDESHEATH SLENDER 7FR .021G (SHEATH) IMPLANT
GUIDEWIRE .025 260CM (WIRE) IMPLANT
PACK CARDIAC CATHETERIZATION (CUSTOM PROCEDURE TRAY) ×1 IMPLANT
SET ATX-X65L (MISCELLANEOUS) IMPLANT
WIRE EMERALD 3MM-J .035X260CM (WIRE) IMPLANT

## 2023-04-26 NOTE — Interval H&P Note (Signed)
 History and Physical Interval Note:  04/26/2023 10:20 AM  Margaret [name]  has presented today for surgery, with the diagnosis of aortic stenosis.  The various methods of treatment have been discussed with the patient and family. After consideration of risks, benefits and other options for treatment, the patient has consented to  Procedure(s): RIGHT/LEFT HEART CATH AND CORONARY ANGIOGRAPHY (N/A) as a surgical intervention.  The patient's history has been reviewed, patient examined, no change in status, stable for surgery.  I have reviewed the patient's chart and labs.  Questions were answered to the patient's satisfaction.     Orbie Pyo

## 2023-04-27 ENCOUNTER — Other Ambulatory Visit: Payer: Self-pay

## 2023-04-27 DIAGNOSIS — I350 Nonrheumatic aortic (valve) stenosis: Secondary | ICD-10-CM

## 2023-04-28 ENCOUNTER — Ambulatory Visit: Payer: Medicare HMO

## 2023-05-04 ENCOUNTER — Encounter: Payer: Self-pay | Admitting: Family Medicine

## 2023-05-04 ENCOUNTER — Ambulatory Visit

## 2023-05-04 ENCOUNTER — Ambulatory Visit: Admitting: Family Medicine

## 2023-05-04 VITALS — BP 132/73 | HR 68 | Temp 97.9°F | Wt 115.2 lb

## 2023-05-04 DIAGNOSIS — I350 Nonrheumatic aortic (valve) stenosis: Secondary | ICD-10-CM | POA: Diagnosis not present

## 2023-05-04 DIAGNOSIS — I2510 Atherosclerotic heart disease of native coronary artery without angina pectoris: Secondary | ICD-10-CM

## 2023-05-04 DIAGNOSIS — M810 Age-related osteoporosis without current pathological fracture: Secondary | ICD-10-CM | POA: Diagnosis not present

## 2023-05-04 MED ORDER — DENOSUMAB 60 MG/ML ~~LOC~~ SOSY
60.0000 mg | PREFILLED_SYRINGE | Freq: Once | SUBCUTANEOUS | Status: AC
  Administered 2023-05-04: 60 mg via SUBCUTANEOUS

## 2023-05-04 NOTE — Patient Instructions (Addendum)

## 2023-05-04 NOTE — Progress Notes (Signed)
 Margaret [name] , Aug 25, 1929, 89 y.o., female MRN: [id number] Patient Care Team    Relationship Specialty Notifications Start End  [name], DO PCP - General Family Medicine  03/08/19   Jodelle Red, MD PCP - Cardiology Cardiology  04/30/22   Rennis Chris, MD Consulting Physician Ophthalmology  03/29/18   Grant Fontana, MD Consulting Physician Ophthalmology  03/29/18   Jacqlyn Krauss, MD Referring Physician Dermatology  08/02/19   [name], MD Consulting Physician Rheumatology  12/16/22     Chief Complaint  Patient presents with   Hospitalization Follow-up     Subjective:  Margaret [name]  is a 89 y.o. female presents for for follow-up on her osteoporosis, due for Prolia shot today. Patient recently underwent heart catheterization in preparation for TAVR procedure. She reports the cardiac catheter procedure went well.  She also has an upcoming CT scheduled. She reports she is weighing her options and is hoping to prevent her children from have no care for her long-term.  No results for input(s): "HGB", "HCT", "WBC", "PLT" in the last 168 hours.    Latest Ref Rng & Units 04/26/2023   11:10 AM 04/26/2023   11:07 AM 04/26/2023   11:04 AM  CMP  Sodium 135 - 145 mmol/L 140  142  144   Potassium 3.5 - 5.1 mmol/L 3.5  3.6  3.7     CARDIAC CATHETERIZATION PERIPHERAL VASCULAR CATHETERIZATION Result Date: 04/26/2023 1.  Minimal nonobstructive coronary artery disease of left dominant system.  2.  Fick cardiac output of 4.8 L/min and Fick cardiac index of 3.2 L/min/m with the following hemodynamics:  Right atrial pressure mean of 4 mmHg  Right ventricular pressure 32/1 with an end-diastolic pressure of 7 mmHg  Wedge pressure mean of 11 mmHg with V waves to 15 mmHg  PA pressure of 33/11 with mean pressure of 19 mmHg  PVR of 1.7 Woods units  PA pulsatility index of greater than 5 3.  Capacious iliofemoral vessels bilaterally. Recommendation: Continue evaluation for  aortic valve intervention.   OCT, Retina - OU - Both Eyes Result Date: 04/21/2023 Right Eye Quality was good. Central Foveal Thickness: 272. Progression has been stable. Findings include normal foveal contour, no IRF, no SRF, retinal drusen , intraretinal hyper-reflective material, epiretinal membrane, outer retinal atrophy (Stable improvement in central cystic changes, Partial PVD; no fluid). Left Eye Quality was good. Central Foveal Thickness: 296. Progression has been stable. Findings include normal foveal contour, no IRF, no SRF, retinal drusen , pigment epithelial detachment, outer retinal atrophy (Trace ERM, Partial PVD). 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 CME - cystoid macular edema. PED - pigment epithelial detachment. IRF - intraretinal fluid. SRF - subretinal fluid. EZ - ellipsoid zone. ERM - epiretinal membrane. ORA - outer retinal atrophy. ORT - outer retinal tubulation. SRHM - subretinal hyper-reflective material  ECHOCARDIOGRAM COMPLETE  IMPRESSIONS 03/2023 1. The aortic valve is tricuspid. There is severe calcifcation of the aortic valve. There is severe thickening of the aortic valve. Aortic valve regurgitation is trivial. Severe aortic valve stenosis. Aortic valve area, by VTI measures 0.69 cm. Aortic valve mean gradient measures 40.0 mmHg. Aortic valve Vmax measures 3.93 m/s.   2. Left ventricular ejection fraction, by estimation, is 55 to 60%. The left ventricle has normal function.  The left ventricle has no regional wall motion abnormalities. There is moderate asymmetric left ventricular hypertrophy of the basal-septal segment. Left ventricular diastolic function could not be evaluated.   3. Right ventricular systolic function is low normal. The right ventricular  size is normal. There is normal pulmonary artery systolic pressure. The estimated right ventricular systolic pressure is 26.8 mmHg.   4. Left atrial size was severely dilated.   5. Right atrial size was mildly dilated.   6. The mitral valve is degenerative. Moderate mitral valve regurgitation. Mild mitral stenosis. The mean mitral valve gradient is 4.0 mmHg with average heart rate of 57 bpm. Severe mitral annular calcification.   7. The inferior vena cava is normal in size with greater than 50% respiratory variability, suggesting right atrial pressure of 3 mmHg.        05/04/2023   11:40 AM 01/12/2023    3:05 PM 12/16/2022   10:04 AM 07/23/2022   11:16 AM 06/16/2022   10:22 AM  Depression screen PHQ 2/9  Decreased Interest 0 0 0 0 0  Down, Depressed, Hopeless 0 0 0 0 0  PHQ - 2 Score 0 0 0 0 0  Altered sleeping 1 1  1    Tired, decreased energy 1 1  1    Change in appetite 1 0  0   Feeling bad or failure about yourself  0 0  0   Trouble concentrating 0 0  1   Moving slowly or fidgety/restless 0 0  0   Suicidal thoughts 0 0  0   PHQ-9 Score 3 2  3    Difficult doing work/chores Not difficult at all Not difficult at all  Not difficult at all     Allergies  Allergen Reactions   Augmentin [Amoxicillin-Pot Clavulanate] Swelling and Rash    Swelling in face   Lactose Intolerance (Gi)    Penicillin G     Other Reaction(s): swelling of face   Social History   Tobacco Use   Smoking status: Former    Current packs/day: 0.00    Average packs/day: 0.5 packs/day for 13.0 years (6.5 ttl pk-yrs)    Types: Cigarettes    Start date: 53    Quit date: 1965    Years since quitting: 60.2   Smokeless tobacco: Never  Substance Use Topics   Alcohol use: Not Currently    Alcohol/week: 5.0 standard drinks of alcohol    Types: 5 Standard drinks or equivalent per week   Past Medical History:  Diagnosis Date   Allergy    Arthritis of left knee    Asthma    Carotid artery stenosis  10/17/2012   stable report in 2018- "moderate on right, minimal on left"   Carpal tunnel syndrome 2018   left.   Cervical radiculopathy 05/16/2015   DDD C4-C5, C5-C6, and C6-C7.  Facet arthropathy throughout cervical spine.  Normal foraminal narrowing secondary to uncovertebral  arthropathy is seen bilaterally at C3-C4 and C5/C6   Chicken pox    COVID-19 03/04/2023   DDD (degenerative disc disease), lumbar 04/26/2015   Mild levoscoliosis, right anterior listhesis of L4 and L5.  DDD at every level.  Facet arthropathy at multiple levels.   Deviated septum    Glaucoma    both eyes, mild opened angle   Heart murmur    History of colon polyps    History of fall    History of frequent urinary tract infections    klebs- pansensitive. c. 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    Pseudophakia of both eyes    PVD (peripheral vascular disease) (HCC)    Rheumatic fever    89 years old    Skin lesion 12/31/2021   Syncope 05/17/2017   Thyroid disease    Urinary incontinence    Vertigo    had been prescribed antivert   Vitamin D deficiency    Past Surgical History:  Procedure Laterality Date   ABDOMINAL AORTOGRAM N/A 04/26/2023   Procedure: ABDOMINAL AORTOGRAM;  Surgeon: Orbie Pyo, MD;  Location: [facility];  Service: Cardiovascular;  Laterality: N/A;   CARPAL TUNNEL RELEASE Bilateral 2019   CATARACT EXTRACTION Bilateral 1997   COLONOSCOPY  2010   NASAL SEPTUM SURGERY  2007   RIGHT HEART CATH AND CORONARY ANGIOGRAPHY N/A 04/26/2023   Procedure: RIGHT HEART CATH AND CORONARY ANGIOGRAPHY;  Surgeon: Orbie Pyo, MD;  Location: [facility];   Service: Cardiovascular;  Laterality: N/A;   WRIST FRACTURE SURGERY Right 1996   Family History  Problem Relation Age of Onset   Arthritis Mother    COPD Mother    Prostate cancer Father    Arthritis Father    Hearing loss Father    Heart disease Father    Heart attack Father    Stroke Maternal Grandmother    Liver cancer Sister    Arthritis Brother    Prostate cancer Brother    COPD Brother    Heart disease Brother    Breast cancer Daughter    Blindness Maternal Grandfather    Stroke Maternal Grandfather    COPD Paternal Grandmother    Breast cancer Paternal Grandmother    Allergies as of 05/04/2023       Reactions   Augmentin [amoxicillin-pot Clavulanate] Swelling, Rash   Swelling in face   Lactose Intolerance (gi)    Penicillin G    Other Reaction(s): swelling of face        Medication List        Accurate as of May 04, 2023 12:03 PM. If you have any questions, ask your nurse or doctor.          albuterol 108 (90 Base) MCG/ACT inhaler Commonly known as: VENTOLIN HFA Inhale 2 puffs into the lungs every 6 (six) hours as needed for wheezing or shortness of breath.   cycloSPORINE 0.05 % ophthalmic emulsion Commonly known as: RESTASIS Place 1 drop into both eyes 2 (two) times daily.   leflunomide 10 MG tablet Commonly known as: ARAVA Take 10 mg by mouth daily.   levothyroxine 50 MCG tablet Commonly known as: SYNTHROID Take 1 tablet (50 mcg total) by mouth daily before breakfast.   montelukast 10 MG tablet Commonly known as: SINGULAIR Take 1 tablet (10 mg total) by mouth at bedtime.   polyvinyl alcohol 1.4 % ophthalmic solution Commonly known as: LIQUIFILM TEARS Place 1 drop into both eyes as needed for dry eyes.   predniSONE 5 MG tablet Commonly known as: DELTASONE Take 5 mg by mouth daily. Every other day   pregabalin 25 MG capsule Commonly  known as: Lyrica Take 1 capsule (25 mg total) by mouth at bedtime.   PreserVision AREDS 2 Caps Take  1 capsule by mouth 2 (two) times daily.   multivitamin with minerals tablet Take 1 tablet by mouth daily.   triamcinolone cream 0.1 % Commonly known as: KENALOG 1 application Externally once a day for 30 days        All past medical history, surgical history, allergies, family history, immunizations and medications were updated in the EMR today and reviewed under the history and medication portions of their EMR.      ROS: Negative, with the exception of above mentioned in HPI   Objective:  BP 132/73   Pulse 68   Temp 97.9 F (36.6 C)   Wt 115 lb 3.2 oz (52.3 kg)   SpO2 98%   BMI 22.50 kg/m  Body mass index is 22.5 kg/m. Physical Exam Vitals and nursing note reviewed.  Constitutional:      General: She is not in acute distress.    Appearance: Normal appearance. She is not ill-appearing, toxic-appearing or diaphoretic.  HENT:     Head: Normocephalic and atraumatic.  Eyes:     General: No scleral icterus.       Right eye: No discharge.        Left eye: No discharge.     Extraocular Movements: Extraocular movements intact.     Conjunctiva/sclera: Conjunctivae normal.     Pupils: Pupils are equal, round, and reactive to light.  Cardiovascular:     Rate and Rhythm: Normal rate and regular rhythm.     Heart sounds: Murmur heard.  Pulmonary:     Effort: Pulmonary effort is normal. No respiratory distress.     Breath sounds: Normal breath sounds. No wheezing, rhonchi or rales.  Musculoskeletal:     Right lower leg: No edema.     Left lower leg: No edema.  Skin:    General: Skin is warm.     Findings: No rash.  Neurological:     Mental Status: She is alert and oriented to person, place, and time. Mental status is at baseline.     Motor: No weakness.     Gait: Gait normal.  Psychiatric:        Mood and Affect: Mood normal.        Behavior: Behavior normal.        Thought Content: Thought content normal.        Judgment: Judgment normal.       Assessment/Plan: Margaret [name] is a 89 y.o. female present for OV Age-related osteoporosis without current pathological fracture Continue Prolia every 6 months as long as kidney function allows, kidney function has been normal. - denosumab (PROLIA) injection 60 mg  Nonrheumatic aortic valve stenosis (Primary) Discussed TAVR and aortic stenosis in detail today.  We discussed a successful surgery would mean improvement in her circulatory system efficiency/blood and oxygen delivery to her body and brain. We discussed her concerns today, which mostly stem around her not wanting to be a burden on her children, requiring long time care or be "senile'.  She wants to be able to remain as independent as possible. Her expectations are appropriate, she understands there is no way to guarantee a certain amount years, or less than a certain amount years life expectancy, senile versus nonsenile for anyone. I do believe if she has a successful uncomplicated TAVR procedure, she likely will feel better, have more energy and likely less chance of having  memory changes that are due to lack of blood/oxygen to the brain once circulation is improved.  She understands a course, this does not rule out the other causes for potential development of dementia.  Coronary artery disease involving native coronary artery of native heart without angina pectoris Recovering well from planned/scheduled cardiac cath procedure  Reviewed expectations re: course of current medical issues. Discussed self-management of symptoms. Outlined signs and symptoms indicating need for more acute intervention. Patient verbalized understanding and all questions were answered. Patient received an After-Visit Summary. Any changes in medications were reviewed and patient was provided with updated med list with their AVS.     No orders of the defined types were placed in this encounter.    Note is dictated utilizing voice recognition  software. Although note has been proof read prior to signing, occasional typographical errors still can be missed. If any questions arise, please do not hesitate to call for verification.   electronically signed by:  Felix Pacini, DO  [facility] [place]

## 2023-05-09 ENCOUNTER — Other Ambulatory Visit (HOSPITAL_COMMUNITY): Payer: Self-pay

## 2023-05-09 ENCOUNTER — Telehealth: Payer: Self-pay

## 2023-05-09 NOTE — Telephone Encounter (Signed)
 Pt ready for scheduling for Prolia on or after : 05/03/13  Option# 1: Buy/Bill (Office supplied medication)  Out-of-pocket cost due at time of clinic visit: $0  Number of injection/visits approved: --  Primary: [employer] - Medicare Prolia co-insurance: 100% Admin fee co-insurance: 100%  Secondary: N/A Prolia co-insurance:  Admin fee co-insurance:   Medical Benefit Details: Date Benefits were checked: 04/05/23 Deductible: no/ Coinsurance: 100%/ Admin Fee: 100%  Prior Auth: Approved PA# [id number] Expiration Date: 10/26/22 to 10/26/23  # of doses approved: ----------------------------------------------------------------------- Option# 2- Med Obtained from pharmacy:  Pharmacy benefit: Copay $500 (Paid to pharmacy) Admin Fee: 100% (Pay at clinic)  Prior Auth: not required PA# Expiration Date:   # of doses approved:   If patient wants fill through the pharmacy benefit please send prescription to:  [facility] , and include estimated need by date in rx notes. Pharmacy will ship medication directly to the office.  Patient not eligible for Prolia Copay Card. Copay Card can make patient's cost as little as $25. Link to apply: [email or web address]  ** This summary of benefits is an estimation of the patient's out-of-pocket cost. Exact cost may very based on individual plan coverage.

## 2023-05-09 NOTE — Telephone Encounter (Signed)
 Margaret [name]

## 2023-05-25 ENCOUNTER — Ambulatory Visit (HOSPITAL_COMMUNITY)
Admission: RE | Admit: 2023-05-25 | Discharge: 2023-05-25 | Disposition: A | Discharge status: Home / Self Care | Source: Ambulatory Visit | Attending: Family Medicine | Admitting: Family Medicine

## 2023-05-25 DIAGNOSIS — I350 Nonrheumatic aortic (valve) stenosis: Secondary | ICD-10-CM

## 2023-05-25 MED ORDER — IOHEXOL 350 MG/ML SOLN
95.0000 mL | Freq: Once | INTRAVENOUS | Status: AC | PRN
  Administered 2023-05-25: 95 mL via INTRAVENOUS

## 2023-05-25 MED ORDER — IOHEXOL 350 MG/ML SOLN: 95.0000 mL | Freq: Once | INTRAVENOUS | Status: DC | PRN

## 2023-06-01 ENCOUNTER — Ambulatory Visit (INDEPENDENT_AMBULATORY_CARE_PROVIDER_SITE_OTHER): Admitting: Family Medicine

## 2023-06-01 ENCOUNTER — Encounter: Payer: Self-pay | Admitting: Family Medicine

## 2023-06-01 VITALS — BP 128/68 | HR 71 | Temp 97.8°F | Wt 117.0 lb

## 2023-06-01 DIAGNOSIS — L989 Disorder of the skin and subcutaneous tissue, unspecified: Secondary | ICD-10-CM

## 2023-06-01 MED ORDER — FLUOCINONIDE 0.05 % EX OINT: 1.0000 | TOPICAL_OINTMENT | Freq: Two times a day (BID) | CUTANEOUS | 0 refills | Status: AC

## 2023-06-01 NOTE — Progress Notes (Signed)
 Margaret [name] , [birth year], 89 y.o., female MRN: [id number] Patient Care Team    Relationship Specialty Notifications Start End  [name], DO PCP - General Family Medicine  03/08/19   Jodelle Red, MD PCP - Cardiology Cardiology  04/30/22   Rennis Chris, MD Consulting Physician Ophthalmology  03/29/18   Grant Fontana, MD Consulting Physician Ophthalmology  03/29/18   Jacqlyn Krauss, MD Referring Physician Dermatology  08/02/19   [name], MD Consulting Physician Rheumatology  12/16/22     Chief Complaint  Patient presents with   Leg Injury    Small wound on L calf; Pt does not recall anything that could have caused wound. Denies falls. Denies pain .Wound is sore to the touch. Pt has applied neosporin.       Subjective: Margaret [name] is a 89 y.o. Pt presents for an OV with complaints of small round skin lesion left anterior shin of a few months duration.  Associated symptoms include patient reports the lesion does not seem to heal.  She does not recall an injury or bug bite.  Pt has tried Neosporin to ease their symptoms.      05/04/2023   11:40 AM 01/12/2023    3:05 PM 12/16/2022   10:04 AM 07/23/2022   11:16 AM 06/16/2022   10:22 AM  Depression screen PHQ 2/9  Decreased Interest 0 0 0 0 0  Down, Depressed, Hopeless 0 0 0 0 0  PHQ - 2 Score 0 0 0 0 0  Altered sleeping 1 1  1    Tired, decreased energy 1 1  1    Change in appetite 1 0  0   Feeling bad or failure about yourself  0 0  0   Trouble concentrating 0 0  1   Moving slowly or fidgety/restless 0 0  0   Suicidal thoughts 0 0  0   PHQ-9 Score 3 2  3    Difficult doing work/chores Not difficult at all Not difficult at all  Not difficult at all     Allergies  Allergen Reactions   Augmentin [Amoxicillin-Pot Clavulanate] Swelling and Rash    Swelling in face   Lactose Intolerance (Gi)    Penicillin G     Other Reaction(s): swelling of face   Social History   Social History  Narrative   Marital status/children/pets: Widowed.  Moved to [place] from [place] area) 2019.  She shares a home with 1 of her daughters.  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   Cervical radiculopathy 05/16/2015   DDD C4-C5, C5-C6, and C6-C7.  Facet arthropathy throughout cervical spine.  Normal foraminal narrowing secondary to uncovertebral  arthropathy is seen bilaterally at C3-C4 and C5/C6   Chicken pox    COVID-19 03/04/2023   DDD (degenerative disc disease), lumbar 04/26/2015   Mild levoscoliosis, right  anterior listhesis of L4 and L5.  DDD at every level.  Facet arthropathy at multiple levels.   Deviated septum    Glaucoma    both eyes, mild opened angle   Heart murmur    History of colon polyps    History of fall    History of frequent urinary tract infections    klebs- pansensitive. c. 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    Pseudophakia of both eyes    PVD (peripheral vascular disease) (HCC)    Rheumatic fever    89 years old    Skin lesion 12/31/2021   Syncope 05/17/2017   Thyroid disease    Urinary incontinence    Vertigo    had been prescribed antivert   Vitamin D  deficiency    Past Surgical History:  Procedure Laterality Date   ABDOMINAL AORTOGRAM N/A 04/26/2023   Procedure: ABDOMINAL AORTOGRAM;  Surgeon: Orbie Pyo, MD;  Location: [facility];  Service: Cardiovascular;  Laterality: N/A;   CARPAL TUNNEL RELEASE Bilateral 2019   CATARACT EXTRACTION Bilateral 1997   COLONOSCOPY  2010   NASAL SEPTUM SURGERY  2007   RIGHT HEART CATH AND CORONARY ANGIOGRAPHY N/A 04/26/2023   Procedure: RIGHT HEART CATH AND CORONARY ANGIOGRAPHY;  Surgeon: Orbie Pyo, MD;  Location: [facility];  Service: Cardiovascular;  Laterality: N/A;   WRIST FRACTURE SURGERY Right 1996   Family History  Problem Relation Age of Onset   Arthritis Mother    COPD Mother    Prostate cancer Father    Arthritis Father    Hearing loss Father    Heart disease Father    Heart attack Father    Stroke Maternal Grandmother    Liver cancer Sister    Arthritis Brother    Prostate cancer Brother    COPD Brother    Heart disease Brother    Breast cancer Daughter    Blindness Maternal Grandfather    Stroke Maternal Grandfather    COPD Paternal Grandmother    Breast cancer Paternal Grandmother    Allergies as of 06/01/2023       Reactions   Augmentin [amoxicillin-pot Clavulanate] Swelling, Rash   Swelling in face   Lactose Intolerance (gi)    Penicillin G    Other Reaction(s): swelling of face        Medication List        Accurate as of June 01, 2023  3:38 PM. If you have any questions, ask your nurse or doctor.          STOP taking these medications    triamcinolone cream 0.1 % Commonly known as: KENALOG Stopped by: Felix Pacini       TAKE these medications    albuterol 108 (90 Base) MCG/ACT inhaler Commonly known as: VENTOLIN HFA Inhale 2 puffs into the lungs every 6 (six) hours as needed for wheezing or shortness of breath.   cycloSPORINE 0.05 % ophthalmic emulsion Commonly known as: RESTASIS Place 1 drop into both eyes 2 (two)  times daily.   fluocinonide ointment 0.05 %  Commonly known as: LIDEX Apply 1 Application topically 2 (two) times daily for 14 days. Apply BID to skin lesion on shin for 14 days Started by: Felix Pacini   leflunomide 10 MG tablet Commonly known as: ARAVA Take 10 mg by mouth daily.   levothyroxine 50 MCG tablet Commonly known as: SYNTHROID Take 1 tablet (50 mcg total) by mouth daily before breakfast.   montelukast 10 MG tablet Commonly known as: SINGULAIR Take 1 tablet (10 mg total) by mouth at bedtime.   polyvinyl alcohol 1.4 % ophthalmic solution Commonly known as: LIQUIFILM TEARS Place 1 drop into both eyes as needed for dry eyes.   predniSONE 5 MG tablet Commonly known as: DELTASONE Take 5 mg by mouth daily. Every other day   pregabalin 25 MG capsule Commonly known as: Lyrica Take 1 capsule (25 mg total) by mouth at bedtime.   PreserVision AREDS 2 Caps Take 1 capsule by mouth 2 (two) times daily.   multivitamin with minerals tablet Take 1 tablet by mouth daily.        All past medical history, surgical history, allergies, family history, immunizations andmedications were updated in the EMR today and reviewed under the history and medication portions of their EMR.     ROS Negative, with the exception of above mentioned in HPI   Objective:  BP 128/68   Pulse 71   Temp 97.8 F (36.6 C)   Wt 117 lb (53.1 kg)   SpO2 96%   BMI 22.85 kg/m  Body mass index is 22.85 kg/m.  Physical Exam Vitals and nursing note reviewed.  Constitutional:      General: She is not in acute distress.    Appearance: Normal appearance. She is normal weight. She is not ill-appearing or toxic-appearing.  HENT:     Head: Normocephalic and atraumatic.  Eyes:     General: No scleral icterus.       Right eye: No discharge.        Left eye: No discharge.     Extraocular Movements: Extraocular movements intact.     Conjunctiva/sclera: Conjunctivae normal.     Pupils: Pupils are  equal, round, and reactive to light.  Skin:    Findings: Lesion (left shin: 60mmx5mm erythemic round slightly raised rough  skin lesion) present. No rash.  Neurological:     Mental Status: She is alert and oriented to person, place, and time. Mental status is at baseline.     Motor: No weakness.     Coordination: Coordination normal.     Gait: Gait normal.  Psychiatric:        Mood and Affect: Mood normal.        Behavior: Behavior normal.        Thought Content: Thought content normal.        Judgment: Judgment normal.      No results found. No results found. No results found for this or any previous visit (from the past 24 hours).  Assessment/Plan: Margaret [name] is a 89 y.o. female present for OV for  Skin lesion (Primary) Skin lesion does not appear to be infected, there is a small excoriation over original lesion.  Possibly scar from injury she was not aware of, versus irritation.  Cannot rule out early skin cancer lesion, given presence has been a few months and that it is a nonhealing area consistent with BCC for early SCC Lidex ointment twice daily x 2 weeks - encouraged her to follow up with her dermatologist in  4 weeks, after use of steroid cream for evaluation and rule out need for bx/skin cancer  Reviewed expectations re: course of current medical issues. Discussed self-management of symptoms. Outlined signs and symptoms indicating need for more acute intervention. Patient verbalized understanding and all questions were answered. Patient received an After-Visit Summary.    No orders of the defined types were placed in this encounter.  Meds ordered this encounter  Medications   fluocinonide ointment (LIDEX) 0.05 %    Sig: Apply 1 Application topically 2 (two) times daily for 14 days. Apply BID to skin lesion on shin for 14 days    Dispense:  30 g    Refill:  0   Referral Orders  No referral(s) requested today     Note is dictated utilizing voice recognition  software. Although note has been proof read prior to signing, occasional typographical errors still can be missed. If any questions arise, please do not hesitate to call for verification.   electronically signed by:  Felix Pacini, DO  [facility] - [place]

## 2023-06-01 NOTE — Patient Instructions (Signed)

## 2023-06-09 ENCOUNTER — Telehealth: Payer: Self-pay

## 2023-06-09 ENCOUNTER — Encounter: Payer: Medicare HMO | Admitting: Family Medicine

## 2023-06-09 ENCOUNTER — Encounter: Admitting: Thoracic Surgery (Cardiothoracic Vascular Surgery)

## 2023-06-09 NOTE — Telephone Encounter (Signed)
 Armin Landing (the patient's daughter) called to ask about rescheduling her cancelled TCTS appointment.  She requested the patient is called tomorrow to discuss. If the patient does not answer, Armin Landing can be reached at [phone].

## 2023-06-10 NOTE — Telephone Encounter (Signed)
 I spoke with the patient and appointment with cardiac surgeon rescheduled to 5/2 at 2:00 PM with Dr Sherene Dilling.

## 2023-06-24 ENCOUNTER — Ambulatory Visit: Admitting: Surgery

## 2023-06-24 ENCOUNTER — Ambulatory Visit
Attending: Thoracic Surgery (Cardiothoracic Vascular Surgery) | Admitting: Thoracic Surgery (Cardiothoracic Vascular Surgery)

## 2023-06-24 ENCOUNTER — Encounter: Payer: Self-pay | Admitting: Thoracic Surgery (Cardiothoracic Vascular Surgery)

## 2023-06-24 VITALS — BP 152/82 | HR 79 | Resp 20 | Ht 60.0 in | Wt 117.2 lb

## 2023-06-24 DIAGNOSIS — I350 Nonrheumatic aortic (valve) stenosis: Secondary | ICD-10-CM | POA: Diagnosis not present

## 2023-06-27 NOTE — Progress Notes (Signed)
 [address]       [place] [address]             [phone]        [facility] Medical Record #[id number] Date of Birth: [birth year]  Referring: Sheryle Donning,* Primary Care: Mariel Shope, DO Primary Cardiologist:Bridgette Veryl Gottron, MD  Chief Complaint:    Chief Complaint  Patient presents with   Aortic Stenosis    New patient consultation, review all studies    History of Present Illness:     Margaret [name] 89 y.o. female presents for surgical evaluation of severe aortic stenosis.  She is symptomatic with shortness of breath, and occasional chest pain with exertion.  She currently lives independently.     Past Medical History:  Diagnosis Date   Allergy    Arthritis of left knee    Asthma    Carotid artery stenosis 10/17/2012   stable report in 2018- "moderate on right, minimal on left"   Carpal tunnel syndrome 2018   left.   Cervical radiculopathy 05/16/2015   DDD C4-C5, C5-C6, and C6-C7.  Facet arthropathy throughout cervical spine.  Normal foraminal narrowing secondary to uncovertebral  arthropathy is seen bilaterally at C3-C4 and C5/C6   Chicken pox    COVID-19 03/04/2023   DDD (degenerative disc disease), lumbar 04/26/2015   Mild levoscoliosis, right anterior listhesis of L4 and L5.  DDD at every level.  Facet arthropathy at multiple levels.   Deviated septum    Glaucoma    both eyes, mild opened angle   Heart murmur    History of colon polyps    History of fall    History of frequent urinary tract infections    klebs- pansensitive. c. 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     Pseudophakia of both eyes    PVD (peripheral vascular disease) (HCC)    Rheumatic fever    89 years old    Skin lesion 12/31/2021   Syncope 05/17/2017   Thyroid  disease    Urinary incontinence    Vertigo    had been prescribed antivert   Vitamin D  deficiency     Past Surgical History:  Procedure Laterality Date   ABDOMINAL AORTOGRAM N/A 04/26/2023   Procedure: ABDOMINAL AORTOGRAM;  Surgeon: Kyra Phy, MD;  Location: [facility];  Service: Cardiovascular;  Laterality: N/A;   CARPAL TUNNEL RELEASE Bilateral 2019   CATARACT EXTRACTION Bilateral 1997   COLONOSCOPY  2010   NASAL SEPTUM SURGERY  2007   RIGHT HEART CATH AND CORONARY ANGIOGRAPHY N/A 04/26/2023   Procedure: RIGHT HEART CATH AND CORONARY ANGIOGRAPHY;  Surgeon: Kyra Phy, MD;  Location: [facility];  Service: Cardiovascular;  Laterality: N/A;   WRIST FRACTURE SURGERY Right 1996    Social History:  Social History   Tobacco Use  Smoking Status Former   Current packs/day: 0.00   Average packs/day: 0.5 packs/day for 13.0 years (6.5 ttl pk-yrs)   Types: Cigarettes   Start date: 26   Quit date: 1965  Years since quitting: 60.3  Smokeless Tobacco Never    Social History   Substance and Sexual Activity  Alcohol  Use Not Currently   Alcohol /week: 5.0 standard drinks of alcohol    Types: 5 Standard drinks or equivalent per week     Allergies  Allergen Reactions   Augmentin  [Amoxicillin -Pot Clavulanate] Swelling and Rash    Swelling in face   Lactose Intolerance (Gi)    Penicillin G     Other Reaction(s): swelling of face      Current Outpatient Medications  Medication Sig Dispense Refill   albuterol  (VENTOLIN  HFA) 108 (90 Base) MCG/ACT inhaler Inhale 2 puffs into the lungs every 6 (six) hours as needed for wheezing or shortness of breath. 1 each 0   cycloSPORINE  (RESTASIS ) 0.05 % ophthalmic emulsion Place 1 drop into both eyes 2 (two) times daily.     leflunomide (ARAVA) 10 MG  tablet Take 10 mg by mouth daily.     levothyroxine  (SYNTHROID ) 50 MCG tablet Take 1 tablet (50 mcg total) by mouth daily before breakfast. 90 tablet 3   montelukast  (SINGULAIR ) 10 MG tablet Take 1 tablet (10 mg total) by mouth at bedtime. 90 tablet 1   Multiple Vitamins-Minerals (MULTIVITAMIN WITH MINERALS) tablet Take 1 tablet by mouth daily.     Multiple Vitamins-Minerals (PRESERVISION AREDS 2) CAPS Take 1 capsule by mouth 2 (two) times daily.     polyvinyl alcohol  (LIQUIFILM TEARS) 1.4 % ophthalmic solution Place 1 drop into both eyes as needed for dry eyes.     predniSONE  (DELTASONE ) 5 MG tablet Take 5 mg by mouth daily. Every other day     pregabalin  (LYRICA ) 25 MG capsule Take 1 capsule (25 mg total) by mouth at bedtime. 90 capsule 1   No current facility-administered medications for this visit.    (Not in a hospital admission)   Family History  Problem Relation Age of Onset   Arthritis Mother    COPD Mother    Prostate cancer Father    Arthritis Father    Hearing loss Father    Heart disease Father    Heart attack Father    Stroke Maternal Grandmother    Liver cancer Sister    Arthritis Brother    Prostate cancer Brother    COPD Brother    Heart disease Brother    Breast cancer Daughter    Blindness Maternal Grandfather    Stroke Maternal Grandfather    COPD Paternal Grandmother    Breast cancer Paternal Grandmother      Review of Systems:   Review of Systems  Constitutional:  Positive for malaise/fatigue.  Respiratory:  Positive for shortness of breath.   Cardiovascular:  Positive for chest pain.  Neurological:  Negative for dizziness.      Physical Exam: BP (!) 152/82 (BP Location: Left Arm, Patient Position: Sitting, Cuff Size: Normal)   Pulse 79   Resp 20   Ht 5' (1.524 m)   Wt 117 lb 3.2 oz (53.2 kg)   SpO2 96% Comment: RA  BMI 22.89 kg/m  Physical Exam Constitutional:      General: She is not in acute distress.    Appearance: She is not  ill-appearing.  HENT:     Head: Normocephalic and atraumatic.  Eyes:     Extraocular Movements: Extraocular movements intact.  Cardiovascular:     Rate and Rhythm: Normal rate.  Pulmonary:     Effort: Pulmonary effort is normal. No respiratory distress.  Abdominal:  General: Abdomen is flat. There is no distension.  Musculoskeletal:        General: Normal range of motion.     Cervical back: Normal range of motion.  Skin:    General: Skin is warm and dry.  Neurological:     General: No focal deficit present.     Mental Status: She is alert and oriented to person, place, and time.       Diagnostic Studies & Laboratory data:    Left Heart Catherization:  Intervention Echo: IMPRESSIONS     1. The aortic valve is tricuspid. There is severe calcifcation of the  aortic valve. There is severe thickening of the aortic valve. Aortic valve  regurgitation is trivial. Severe aortic valve stenosis. Aortic valve area,  by VTI measures 0.69 cm. Aortic  valve mean gradient measures 40.0 mmHg. Aortic valve Vmax measures 3.93  m/s.   2. Left ventricular ejection fraction, by estimation, is 55 to 60%. The  left ventricle has normal function. The left ventricle has no regional  wall motion abnormalities. There is moderate asymmetric left ventricular  hypertrophy of the basal-septal  segment. Left ventricular diastolic function could not be evaluated.   3. Right ventricular systolic function is low normal. The right  ventricular size is normal. There is normal pulmonary artery systolic  pressure. The estimated right ventricular systolic pressure is 26.8 mmHg.   4. Left atrial size was severely dilated.   5. Right atrial size was mildly dilated.   6. The mitral valve is degenerative. Moderate mitral valve regurgitation.  Mild mitral stenosis. The mean mitral valve gradient is 4.0 mmHg with  average heart rate of 57 bpm. Severe mitral annular calcification.   7. The inferior vena cava  is normal in size with greater than 50%  respiratory variability, suggesting right atrial pressure of 3 mmHg.     EKG: Sinus with occasional PVCs  I have independently reviewed the above radiologic studies and discussed with the patient   Recent Lab Findings: Lab Results  Component Value Date   WBC 8.9 04/25/2023   HGB 10.5 (L) 04/26/2023   HCT 31.0 (L) 04/26/2023   PLT 182 04/25/2023   GLUCOSE 185 (H) 04/25/2023   CHOL 217 (A) 05/19/2017   HDL 68 05/19/2017   LDLDIRECT 120.0 06/16/2022   LDLCALC 131 05/19/2017   ALT 37 08/23/2022   AST 51 (H) 08/23/2022   NA 140 04/26/2023   K 3.5 04/26/2023   CL 109 (H) 04/25/2023   CREATININE 0.65 04/25/2023   BUN 15 04/25/2023   CO2 21 04/25/2023   TSH 3.04 06/16/2022   HGBA1C 4.9 08/08/2017      Assessment / Plan:   89yo female with severe aortic stenosis, and NYHA Class II symptoms.  She also has a history of CKD stage II and rheumatoid arthritis.  Her STS risk is 6.86.  On review of her CT, he anatomy would be suitable for a transfemoral TAVR.    We discussed the risks and benefits of a a transfemoral TAVR, and she is agreeable to proceed.  We also discussed the possibility of a sternotomy in the case of an emergency during the case.  Given her age, I do not think that she would have a meaningful recovery, and thus there will not be an option for bailout.  Her and her daughter agree with this assessment.       I  spent 40 minutes counseling the patient face to face.   Marinell Siad  Nevae Pinnix 06/27/2023 11:43 AM

## 2023-07-04 ENCOUNTER — Other Ambulatory Visit: Payer: Self-pay

## 2023-07-04 DIAGNOSIS — I350 Nonrheumatic aortic (valve) stenosis: Secondary | ICD-10-CM

## 2023-07-07 NOTE — Progress Notes (Signed)
 [facility] - Clinic Note  07/08/2023    CHIEF COMPLAINT Patient presents for Retina Follow Up   HISTORY OF PRESENT ILLNESS: Margaret [name] is a 89 y.o. female who presents to the clinic today for:   HPI     Retina Follow Up   Patient presents with  Wet AMD.  In right eye.  This started 11 weeks ago.  I, the attending physician,  performed the HPI with the patient and updated documentation appropriately.        Comments   Patient here for 11 weeks retina follow up for exu ARMD OU. Patient states vision up and down. Not always the same. Generally better than used to be. No eye pain. Goes in next week for heart valve replacement.      Last edited by Ronelle Coffee, MD on 07/08/2023  3:51 PM.     Pt is having heart valve replacement surgery on May 20th, pt states her vision is stable   Referring physician: Napolean Backbone A, DO [address] [place],  [place] [address]  HISTORICAL INFORMATION:  Selected notes from the MEDICAL RECORD NUMBER Self referral for macular degeneration LEE: 03.21.19 (Dr. Candiss Chamorro in [place], [place]) [BCVA: OD: 20/40 OS: 20/40] Ocular Hx-POAG, non-exu ARMD, DES, pseudo, YAG PMH-astham, arthritis,    CURRENT MEDICATIONS: No current facility-administered medications for this visit. (Ophthalmic Drugs)   No current outpatient medications on file. (Ophthalmic Drugs)   No current facility-administered medications for this visit. (Other)   No current outpatient medications on file. (Other)   Facility-Administered Medications Ordered in Other Visits (Other)  Medication Route   0.9 %  sodium chloride  infusion Intravenous   acetaminophen  (TYLENOL ) tablet 650 mg Oral   Or   acetaminophen  (TYLENOL ) suppository 650 mg Rectal   [START ON 07/13/2023] aspirin  chewable tablet 81 mg Oral   leflunomide (ARAVA) tablet 10 mg Oral   [START ON 07/13/2023] levothyroxine  (SYNTHROID ) tablet 50 mcg Oral   montelukast  (SINGULAIR ) tablet 10 mg Oral   morphine  (PF) 2 MG/ML injection 1-4 mg Intravenous   nitroGLYCERIN 50 mg in dextrose  5 % 250 mL (0.2 mg/mL) infusion Intravenous   norepinephrine  (LEVOPHED ) 4mg  in (0.016 mg/mL) premix infusion Intravenous   ondansetron  (ZOFRAN ) injection 4 mg Intravenous   oxyCODONE  (Oxy IR/ROXICODONE ) immediate release tablet 5-10 mg Oral   pregabalin  (LYRICA ) capsule 25 mg Oral   sodium chloride  flush (NS) 0.9 % injection 3 mL Intravenous   sodium chloride  flush (NS) 0.9 % injection 3 mL Intravenous   traMADol  (ULTRAM ) tablet 50-100 mg Oral   REVIEW OF SYSTEMS: ROS   Positive for: Musculoskeletal, Endocrine, Eyes Negative for: Constitutional, Gastrointestinal, Neurological, Skin, Genitourinary, HENT, Cardiovascular, Respiratory, Psychiatric, Allergic/Imm, Heme/Lymph Last edited by Sylvan Evener, COA on 07/08/2023 12:51 PM.       ALLERGIES Allergies  Allergen Reactions   Augmentin  [Amoxicillin -Pot Clavulanate] Swelling and Rash    Swelling in face   Lactose Intolerance (Gi) Diarrhea    bloating   Penicillin G Swelling    swelling of face   PAST MEDICAL HISTORY Past Medical History:  Diagnosis Date   Allergy    Asthma    Carotid artery stenosis 10/17/2012   stable report in 2018- "moderate on right, minimal on left"   Carpal tunnel syndrome 2018   left.   Cervical radiculopathy 05/16/2015   DDD C4-C5, C5-C6, and C6-C7.  Facet arthropathy throughout cervical spine.  Normal foraminal narrowing secondary to uncovertebral  arthropathy is seen  bilaterally at C3-C4 and C5/C6   DDD (degenerative disc disease), lumbar 04/26/2015   Mild levoscoliosis, right anterior listhesis of L4 and L5.  DDD at every level.  Facet arthropathy at multiple levels.   Glaucoma    both eyes, mild opened angle   History of colon polyps    History of frequent urinary tract infections    klebs- pansensitive. c. 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 Deloise Ferries and Dr Lorie Rook   Syncope 05/17/2017   Thyroid  disease    Urinary incontinence    Vertigo    had been prescribed antivert   Vitamin D  deficiency    Past Surgical History:  Procedure Laterality Date   ABDOMINAL AORTOGRAM N/A 04/26/2023   Procedure: ABDOMINAL AORTOGRAM;  Surgeon: Kyra Phy, MD;  Location: [facility];  Service: Cardiovascular;  Laterality: N/A;   CARPAL TUNNEL RELEASE Bilateral 2019   CATARACT EXTRACTION Bilateral 1997   COLONOSCOPY  2010   INTRAOPERATIVE TRANSTHORACIC ECHOCARDIOGRAM N/A 07/12/2023   Procedure: ECHOCARDIOGRAM, TRANSTHORACIC;  Surgeon: Kyra Phy, MD;  Location: [facility];  Service: Cardiovascular;  Laterality: N/A;   NASAL SEPTUM SURGERY  2007   RIGHT HEART CATH AND CORONARY ANGIOGRAPHY N/A 04/26/2023   Procedure: RIGHT HEART CATH AND CORONARY ANGIOGRAPHY;  Surgeon: Kyra Phy, MD;  Location: [facility];  Service: Cardiovascular;  Laterality: N/A;   WRIST FRACTURE SURGERY Right 1996   FAMILY HISTORY Family History  Problem Relation Age of Onset   Arthritis Mother    COPD Mother    Prostate cancer Father    Arthritis Father    Hearing loss Father    Heart disease Father    Heart attack Father    Stroke Maternal Grandmother    Liver cancer Sister    Arthritis Brother    Prostate cancer Brother    COPD Brother    Heart disease Brother    Breast cancer Daughter    Blindness Maternal Grandfather    Stroke  Maternal Grandfather    COPD Paternal Grandmother    Breast cancer Paternal Grandmother    SOCIAL HISTORY Social History   Tobacco Use   Smoking status: Former    Current packs/day: 0.00    Average packs/day: 0.5 packs/day for 13.0 years (6.5 ttl pk-yrs)    Types: Cigarettes    Start date: 70    Quit date: 1965    Years since quitting: 60.4   Smokeless tobacco: Never  Vaping Use   Vaping status: Never Used  Substance Use Topics   Alcohol  use: Not Currently    Alcohol /week: 5.0 standard drinks of alcohol     Types: 5 Standard drinks or equivalent per week   Drug use: Never       OPHTHALMIC EXAM:  Base  Eye Exam     Visual Acuity (Snellen - Linear)       Right Left   Dist cc 20/30 -2 20/25    Correction: Glasses         Tonometry (Tonopen, 12:48 PM)       Right Left   Pressure 16 16         Pupils       Dark Light Shape React APD   Right 3 2 Round Brisk None   Left 3 2 Round Brisk None         Visual Fields (Counting fingers)       Left Right    Full Full         Extraocular Movement       Right Left    Full, Ortho Full, Ortho         Neuro/Psych     Oriented x3: Yes   Mood/Affect: Normal         Dilation     Both eyes: 1.0% Mydriacyl, 2.5% Phenylephrine @ 12:48 PM           Slit Lamp and Fundus Exam     External Exam       Right Left   External Normal Normal         Slit Lamp Exam       Right Left   Lids/Lashes Dermatochalasis - upper lid, Telangiectasia Dermatochalasis - upper lid, mild Meibomian gland dysfunction   Conjunctiva/Sclera Temporal Pinguecula White and quiet   Cornea Arcus, 1+ Punctate epithelial erosions, mild tear film debris, fine endo pigment Arcus, 1-2+ inferior Punctate epithelial erosions, mild tear film debris, fine endo pigment   Anterior Chamber Deep and quiet, narrow temporal angle Deep and quiet   Iris Round and dilated Round and dilated, pigmented mass at 0200 and 0730 behind iris,  anterior to IOL -- ?regression of 0200 lesion   Lens PC IOL in good position with open PC PC IOL in good position with open PC   Anterior Vitreous Vitreous syneresis Vitreous syneresis, Posterior vitreous detachment         Fundus Exam       Right Left   Disc 360 Peripapillary atrophy, Sharp rim, 2+ Pallor sharp rim, temporal PPA, mild Pallor   C/D Ratio 0.3 0.3   Macula Flat, Blunted foveal reflex, drusen, RPE mottling and clumping, trace cystic changes, no heme, early RPE atrophy Flat, Blunted foveal reflex, drusen, RPE mottling, clumping and early atrophy, no heme or edema   Vessels attenuated, Tortuous attenuated, Tortuous   Periphery Attached, scattered Reticular degeneration, pigmented cystoid degeneration temporally, No heme Attached, scattered Reticular degeneration, No heme           Refraction     Wearing Rx       Sphere Cylinder Axis Add   Right -0.75 +2.25 175 +2.75   Left -0.25 +1.25 165 +2.75           IMAGING AND PROCEDURES  Imaging and Procedures for @TODAY @  Surgical pcr screen      Specimen Information: Nasal Mucosa; Nasal Swab    Component Value Flag Ref Range Units Status   MRSA, PCR NEGATIVE      NEGATIVE  Final   Staphylococcus aureus NEGATIVE      NEGATIVE  Final   Comment:   (NOTE) The Xpert SA Assay (FDA approved for NASAL specimens in patients 62 years of age and older), is one component of a comprehensive surveillance  program. It is not intended to diagnose infection nor to guide or monitor treatment. Performed at [facility], [address], [place], [place] [address]            CBC      Component Value Flag Ref Range Units Status   WBC 7.5      4.0 - 10.5 K/uL Final   RBC 4.30      3.87 - 5.11 MIL/uL Final   Hemoglobin 12.9      12.0 - 15.0 g/dL Final   HCT 19.1      47.8 - 46.0 % Final   MCV 91.9      80.0 - 100.0 fL Final   MCH 30.0      26.0 - 34.0 pg Final   MCHC 32.7      30.0 - 36.0 g/dL Final   RDW 29.5       11.5 - 15.5 % Final   Platelets 160      150 - 400 K/uL Final   nRBC 0.0      0.0 - 0.2 % Final   Comment:   Performed at [facility], [address], [place], [place] [address]           Comprehensive metabolic panel      Component Value Flag Ref Range Units Status   Sodium 141      135 - 145 mmol/L Final   Potassium 4.2      3.5 - 5.1 mmol/L Final   Chloride 103      98 - 111 mmol/L Final   CO2 27      22 - 32 mmol/L Final   Glucose, Bld 97      70 - 99 mg/dL Final   Comment:   Glucose reference range applies only to samples taken after fasting for at least 8 hours.   BUN 13      8 - 23 mg/dL Final   Creatinine, Ser 0.71      0.44 - 1.00 mg/dL Final   Calcium 9.7      8.9 - 10.3 mg/dL Final   Total Protein 6.5      6.5 - 8.1 g/dL Final   Albumin 4.1      3.5 - 5.0 g/dL Final   AST 34      15 - 41 U/L Final   ALT 26      0 - 44 U/L Final   Alkaline Phosphatase 75      38 - 126 U/L Final   Total Bilirubin 0.9      0.0 - 1.2 mg/dL Final   GFR, Estimated >60      >60 mL/min Final   Comment:   (NOTE) Calculated using the CKD-EPI Creatinine Equation (2021)    Anion gap 11      5 - 15  Final   Comment:   Performed at [facility], [address] [place], [place] [address]           Protime-INR      Component Value Flag Ref Range Units Status   Prothrombin Time 13.5      11.4 - 15.2 seconds Final   INR 1.0      0.8 - 1.2  Final   Comment:   (NOTE) INR goal varies based on device and disease states. Performed at [facility], [address] [place], [place] [address]  Urinalysis, Routine w reflex microscopic -Urine, Clean Catch      Component Value Flag Ref Range Units Status   Color, Urine STRAW      YELLOW  Final   APPearance CLEAR      CLEAR  Final   Specific Gravity, Urine 1.005      1.005 - 1.030  Final   pH 7.0      5.0 - 8.0  Final   Glucose, UA NEGATIVE      NEGATIVE mg/dL Final   Hgb urine dipstick NEGATIVE       NEGATIVE  Final   Bilirubin Urine NEGATIVE      NEGATIVE  Final   Ketones, ur NEGATIVE      NEGATIVE mg/dL Final   Protein, ur NEGATIVE      NEGATIVE mg/dL Final   Nitrite NEGATIVE      NEGATIVE  Final   Leukocytes,Ua NEGATIVE      NEGATIVE  Final   Comment:   Performed at [facility], [address], [place], [place] [address]           Type and screen      Component   ABO/RH(D)   A POS     Antibody Screen   NEG     Sample Expiration   07/22/2023,2359     Extend sample reason   NO TRANSFUSIONS OR PREGNANCY IN THE PAST 3 MONTHS Performed at [facility], [address] [place], [place] [address]            DG Chest 2 View       CLINICAL DATA:  Severe aortic stenosis.  Preop exam.  EXAM: CHEST - 2 VIEW  COMPARISON:  CT 05/25/2023  FINDINGS: Mild cardiomegaly. Stable mediastinal contours. Aortic atherosclerosis. No focal airspace disease, pulmonary edema, pleural effusion or pneumothorax. No acute osseous findings.  IMPRESSION: Mild cardiomegaly. No acute findings.   Electronically Signed   By: Chadwick Colonel M.D.   On: 07/08/2023 12:33      OCT, Retina - OU - Both Eyes        Right Eye Quality was good. Central Foveal Thickness: 277. Progression has worsened. Findings include normal foveal contour, no IRF, no SRF, retinal drusen , intraretinal hyper-reflective material, epiretinal membrane, outer retinal atrophy (Trace cystic changes temporal fovea, Partial PVD; no fluid).   Left Eye Quality was good. Central Foveal Thickness: 289. Progression has been stable. Findings include normal foveal contour, no IRF, no SRF, retinal drusen , pigment epithelial detachment, outer retinal atrophy (Trace ERM, Partial PVD).   Notes  *Images captured and stored on drive  Diagnosis / Impression:  OD: Exudative ARMD - Trace cystic changes temporal fovea, Partial PVD; no fluid OS: Nonexudative ARMD OS Partial PVD OU  Clinical management:  See  below  Abbreviations: NFP - Normal foveal profile. CME - cystoid macular edema. PED - pigment epithelial detachment. IRF - intraretinal fluid. SRF - subretinal fluid. EZ - ellipsoid zone. ERM - epiretinal membrane. ORA - outer retinal atrophy. ORT - outer retinal tubulation. SRHM - subretinal hyper-reflective material       Intravitreal Injection, Pharmacologic Agent - OD - Right Eye       Time Out 07/08/2023. 1:43 PM. Confirmed correct patient, procedure, site, and patient consented.   Anesthesia Topical anesthesia was used. Anesthetic medications included Lidocaine  2%, Proparacaine 0.5%.   Procedure Preparation included 5% betadine to ocular surface, eyelid speculum. A supplied (32g) needle was used.  Injection: 6 mg faricimab -svoa 6 MG/0.05ML   Route: Intravitreal, Site: Right Eye   NDC: 81191-478-29, Lot: [id number], Expiration date: 06/21/2024, Waste: 0 mL   Post-op Post injection exam found visual acuity of at least counting fingers. The patient tolerated the procedure well. There were no complications. The patient received written and verbal post procedure care education. Post injection medications were not given.            ASSESSMENT/PLAN:    ICD-10-CM   1. Exudative age-related macular degeneration of right eye with active choroidal neovascularization (HCC)  H35.3211 OCT, Retina - OU - Both Eyes    Intravitreal Injection, Pharmacologic Agent - OD - Right Eye    faricimab -svoa (VABYSMO ) 6mg /0.74mL intravitreal injection    2. 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 Long-term use of Plaquenil   Z79.899     5. Pseudophakia of both eyes  Z96.1     6. Primary open angle glaucoma (POAG) of both eyes, mild stage  H40.1131     7. Dry eyes  H04.123      1. Exudative age related macular degeneration, OD  - conversion from nonexudative ARMD noted on 9.23.22 visit  - s/p IVA OD #1  (09.23.22), #2 (10.21.22), #3 (11.18.22), #4 (12.30.22), #5 (02.13.23), #6 (03.24.23), #7 (05.08.23), #8 (06.09.23) -- IVA resistance  - s/p IVE OD #1 (07.24.23), #2 (08.25.23), #3 (09.29.23), #4 (10.27.23) -- IVE resistance  - s/p IVV OD #1 (12.01.23), #2 (01.05.34), #3 (02.02.24), #4 (03.01.24), #5 (03.29.24), #6 (04.26.24), #7 (05.24.24), #8 (06.28.24) #9 (08.16.24), #10 (10.18.24), #11 (12.20.24), #12 (02.27.25) - BCVA OD improved to 20/30 from 20/40 - OCT shows OD: stable improvement in central cystic changes, Partial PVD at 11 weeks - recommend IVV OD #13 today, 05.16.25 w/ f/u at 11 wks again - pt wishes to be treated with IVV - RBA of procedure discussed, questions answered - Vabysmo  informed consent obtained and signed, 12.01.23 - see procedure note - f/u 11 weeks -- DFE/OCT, possible injection   2. Age related macular degeneration, non-exudative OS  - intermediate stage-no change from previous visit - The incidence, anatomy, and pathology of dry AMD, risk of progression, and the AREDS and AREDS 2 study including smoking risks discussed with patient.   - recommend Amsler grid monitoring  3. Iris/ciliary body mass OS - pigmented lesion behind iris but anterior to PCIOL at 0730 and 0300 - 0730 lesion spans 0630 to 0830 behind dilated iris and larger than 0300 lesion  - patient asymptomatic - pt saw Dr. Toya Friar, [facility] on 11.01.22 who dx her with iris margin cyst  4. Plaquenil  use for RA  - baseline testing done 05.20.20 -- started plaquenil  early May 2020 for RA - HVF 10-2 showed non-specific defects OU -- essentially normal baseline test - OCT shows no obvious plaquenil -related changes, but interval development of exudative ARMD OD as above - currently taking 200mg  / day per Dr. Vergia Glasgow instructions --> 3.29 mg/kg body wt per day  - po prednisone , currently as needed which is about once a week - and is also now on 10mg  po MTX weekly + folic acid  - the  American Academy of Ophthalmology recommends dosing 5mg /kg per day to reduce risk of retinal toxicity  - recommend consideration of using alternate medication for RA especially given co-morbidity of age-related macular degeneration OU  - [facility] Henrine Logan at [facility]  5. Pseudophakia OU - s/p CE/IOL w/ ?canaloplasty OU by [facility] Abron Hoist [facility]  - doing well  - continue to monitor  6. History of Glaucoma   - not currently on gtts -- IOP 16 OU - ?underwent canaloplasty in conjunction with CEIOL at [facility]   - referred to and now under the expert care of Dr. Reyne Cave -- initial Visit/consult 10.3.2019  7. Dry Eye Syndrome  - use AT's OU as needed  Ophthalmic Meds Ordered this visit:  Meds ordered this encounter  Medications   faricimab -svoa (VABYSMO ) 6mg /0.30mL intravitreal injection     This document serves as a record of services personally performed by Jeanice Millard, MD, PhD. It was created on their behalf by Eller Gut COT, an ophthalmic technician. The creation of this record is the provider's dictation and/or activities during the visit.    Electronically signed by: Eller Gut COT 05.15.2025  11:39 PM  This document serves as a record of services personally performed by Jeanice Millard, MD, PhD. It was created on their behalf by Morley Arabia. Bevin Bucks, OA an ophthalmic technician. The creation of this record is the provider's dictation and/or activities during the visit.    Electronically signed by: Morley Arabia. Antionette Kirks 07/12/23 11:39 PM  Jeanice Millard, M.D., Ph.D. Diseases & Surgery of the Retina and Vitreous [facility] 07/08/2023   I have reviewed the above documentation for accuracy and completeness, and I agree with the above. Jeanice Millard, M.D., Ph.D. 07/12/23 11:40 PM   Abbreviations: M myopia (nearsighted); A astigmatism; H hyperopia (farsighted); P presbyopia; Mrx spectacle  prescription;  CTL contact lenses; OD right eye; OS left eye; OU both eyes  XT exotropia; ET esotropia; PEK punctate epithelial keratitis; PEE punctate epithelial erosions; DES dry eye syndrome; MGD meibomian gland dysfunction; ATs artificial tears; PFAT's preservative free artificial tears; NSC nuclear sclerotic cataract; PSC posterior subcapsular cataract; ERM epi-retinal membrane; PVD posterior vitreous detachment; RD retinal detachment; DM diabetes mellitus; DR diabetic retinopathy; NPDR non-proliferative diabetic retinopathy; PDR proliferative diabetic retinopathy; CSME clinically significant macular edema; DME diabetic macular edema; dbh dot blot hemorrhages; CWS cotton wool spot; POAG primary open angle glaucoma; C/D cup-to-disc ratio; HVF humphrey visual field; GVF goldmann visual field; OCT optical coherence tomography; IOP intraocular pressure; BRVO Branch retinal vein occlusion; CRVO central retinal vein occlusion; CRAO central retinal artery occlusion; BRAO branch retinal artery occlusion; RT retinal tear; SB scleral buckle; PPV pars plana vitrectomy; VH Vitreous hemorrhage; PRP panretinal laser photocoagulation; IVK intravitreal kenalog; VMT vitreomacular traction; MH Macular hole;  NVD neovascularization of the disc; NVE neovascularization elsewhere; AREDS age related eye disease study; ARMD age related macular degeneration; POAG primary open angle glaucoma; EBMD epithelial/anterior basement membrane dystrophy; ACIOL anterior chamber intraocular lens; IOL intraocular lens; PCIOL posterior chamber intraocular lens; Phaco/IOL phacoemulsification with intraocular lens placement; PRK photorefractive keratectomy; LASIK laser assisted in situ keratomileusis; HTN hypertension; DM diabetes mellitus; COPD chronic obstructive pulmonary disease

## 2023-07-08 ENCOUNTER — Ambulatory Visit (INDEPENDENT_AMBULATORY_CARE_PROVIDER_SITE_OTHER): Payer: Medicare HMO | Admitting: Ophthalmology

## 2023-07-08 ENCOUNTER — Ambulatory Visit: Payer: Self-pay | Admitting: Physician Assistant

## 2023-07-08 ENCOUNTER — Encounter (HOSPITAL_COMMUNITY)
Admission: RE | Admit: 2023-07-08 | Discharge: 2023-07-08 | Disposition: A | Discharge status: Home / Self Care | Source: Ambulatory Visit | Attending: Internal Medicine | Admitting: Internal Medicine

## 2023-07-08 ENCOUNTER — Ambulatory Visit (HOSPITAL_COMMUNITY)
Admission: RE | Admit: 2023-07-08 | Discharge: 2023-07-08 | Disposition: A | Discharge status: Home / Self Care | Source: Ambulatory Visit | Attending: Internal Medicine | Admitting: Internal Medicine

## 2023-07-08 ENCOUNTER — Other Ambulatory Visit: Payer: Self-pay

## 2023-07-08 ENCOUNTER — Encounter (INDEPENDENT_AMBULATORY_CARE_PROVIDER_SITE_OTHER): Payer: Self-pay | Admitting: Ophthalmology

## 2023-07-08 DIAGNOSIS — I350 Nonrheumatic aortic (valve) stenosis: Secondary | ICD-10-CM | POA: Diagnosis not present

## 2023-07-08 DIAGNOSIS — Z01818 Encounter for other preprocedural examination: Secondary | ICD-10-CM | POA: Diagnosis present

## 2023-07-08 DIAGNOSIS — I493 Ventricular premature depolarization: Secondary | ICD-10-CM | POA: Diagnosis not present

## 2023-07-08 DIAGNOSIS — H353122 Nonexudative age-related macular degeneration, left eye, intermediate dry stage: Secondary | ICD-10-CM

## 2023-07-08 DIAGNOSIS — I491 Atrial premature depolarization: Secondary | ICD-10-CM | POA: Insufficient documentation

## 2023-07-08 DIAGNOSIS — Z961 Presence of intraocular lens: Secondary | ICD-10-CM

## 2023-07-08 DIAGNOSIS — H04123 Dry eye syndrome of bilateral lacrimal glands: Secondary | ICD-10-CM

## 2023-07-08 DIAGNOSIS — H401131 Primary open-angle glaucoma, bilateral, mild stage: Secondary | ICD-10-CM

## 2023-07-08 DIAGNOSIS — Z79899 Other long term (current) drug therapy: Secondary | ICD-10-CM | POA: Diagnosis not present

## 2023-07-08 DIAGNOSIS — H353211 Exudative age-related macular degeneration, right eye, with active choroidal neovascularization: Secondary | ICD-10-CM | POA: Diagnosis not present

## 2023-07-08 DIAGNOSIS — D4989 Neoplasm of unspecified behavior of other specified sites: Secondary | ICD-10-CM

## 2023-07-08 LAB — SURGICAL PCR SCREEN
MRSA, PCR: NEGATIVE
Staphylococcus aureus: NEGATIVE

## 2023-07-08 LAB — TYPE AND SCREEN
ABO/RH(D): A POS
Antibody Screen: NEGATIVE

## 2023-07-08 LAB — COMPREHENSIVE METABOLIC PANEL WITH GFR
ALT: 26 U/L (ref 0–44)
AST: 34 U/L (ref 15–41)
Albumin: 4.1 g/dL (ref 3.5–5.0)
Alkaline Phosphatase: 75 U/L (ref 38–126)
Anion gap: 11 (ref 5–15)
BUN: 13 mg/dL (ref 8–23)
CO2: 27 mmol/L (ref 22–32)
Calcium: 9.7 mg/dL (ref 8.9–10.3)
Chloride: 103 mmol/L (ref 98–111)
Creatinine, Ser: 0.71 mg/dL (ref 0.44–1.00)
GFR, Estimated: >60 mL/min (ref >60)
Glucose, Bld: 97 mg/dL (ref 70–99)
Potassium: 4.2 mmol/L (ref 3.5–5.1)
Sodium: 141 mmol/L (ref 135–145)
Total Bilirubin: 0.9 mg/dL (ref 0.0–1.2)
Total Protein: 6.5 g/dL (ref 6.5–8.1)

## 2023-07-08 LAB — CBC
HCT: 39.5 % (ref 36.0–46.0)
Hemoglobin: 12.9 g/dL (ref 12.0–15.0)
MCH: 30 pg (ref 26.0–34.0)
MCHC: 32.7 g/dL (ref 30.0–36.0)
MCV: 91.9 fL (ref 80.0–100.0)
Platelets: 160 10*3/uL (ref 150–400)
RBC: 4.3 MIL/uL (ref 3.87–5.11)
RDW: 13 % (ref 11.5–15.5)
WBC: 7.5 10*3/uL (ref 4.0–10.5)
nRBC: 0 % (ref 0.0–0.2)

## 2023-07-08 LAB — PROTIME-INR
INR: 1 (ref 0.8–1.2)
Prothrombin Time: 13.5 s (ref 11.4–15.2)

## 2023-07-08 LAB — URINALYSIS, ROUTINE W REFLEX MICROSCOPIC
Bilirubin Urine: NEGATIVE
Glucose, UA: NEGATIVE mg/dL
Hgb urine dipstick: NEGATIVE
Ketones, ur: NEGATIVE mg/dL
Leukocytes,Ua: NEGATIVE
Nitrite: NEGATIVE
Protein, ur: NEGATIVE mg/dL
Specific Gravity, Urine: 1.005 (ref 1.005–1.030)
pH: 7 (ref 5.0–8.0)

## 2023-07-08 MED ORDER — FARICIMAB-SVOA 6 MG/0.05ML IZ SOSY
6.0000 mg | PREFILLED_SYRINGE | INTRAVITREAL | Status: AC | PRN
Start: 2023-07-08 — End: 2023-07-08
  Administered 2023-07-08: 6 mg via INTRAVITREAL

## 2023-07-08 NOTE — Progress Notes (Signed)
 Patient signed all consents at PAT lab appointment. CHG soap and instructions were given to patient. CHG surgical prep reviewed with patient and all questions answered.  Patients chart send to anesthesia for review. Pt denies any respiratory illness/infection in the last two months.

## 2023-07-11 MED ORDER — POTASSIUM CHLORIDE 2 MEQ/ML IV SOLN
80.0000 meq | INTRAVENOUS | Status: DC
  Filled 2023-07-11 (×2): qty 40

## 2023-07-11 MED ORDER — DEXMEDETOMIDINE HCL IN NACL 400 MCG/100ML IV SOLN
0.1000 ug/kg/h | INTRAVENOUS | Status: AC
  Administered 2023-07-12: 1 ug/kg/h via INTRAVENOUS
  Filled 2023-07-11: qty 100

## 2023-07-11 MED ORDER — CEFAZOLIN SODIUM-DEXTROSE 2-4 GM/100ML-% IV SOLN
2.0000 g | INTRAVENOUS | Status: AC
  Administered 2023-07-12: 2 g via INTRAVENOUS
  Filled 2023-07-11: qty 100

## 2023-07-11 MED ORDER — MAGNESIUM SULFATE 50 % IJ SOLN
40.0000 meq | INTRAMUSCULAR | Status: DC
Start: 2023-07-12 — End: 2023-07-12
  Filled 2023-07-11 (×2): qty 9.85

## 2023-07-11 MED ORDER — NOREPINEPHRINE 4 MG/250ML-% IV SOLN
0.0000 ug/min | INTRAVENOUS | Status: AC
  Administered 2023-07-12: 2 ug/min via INTRAVENOUS
  Filled 2023-07-11: qty 250

## 2023-07-11 MED ORDER — HEPARIN 30,000 UNITS/1000 ML (OHS) CELLSAVER SOLUTION
Status: DC
  Filled 2023-07-11 (×2): qty 1000

## 2023-07-11 NOTE — H&P (Signed)
 Cardiothoracic Surgery Admission History and Physical   Margaret [name] [facility] Medical Record #[id number] Date of Birth: Jul 12, 1929   Referring: Sheryle Donning,* Primary Care: Mariel Shope, DO Primary Cardiologist:Bridgette Veryl Gottron, MD   Chief Complaint:        Chief Complaint  Patient presents with   Aortic Stenosis      New patient consultation, review all studies      History of Present Illness:     Margaret [name] 89 y.o. female presents for surgical evaluation of severe aortic stenosis.  She is symptomatic with shortness of breath, and occasional chest pain with exertion.  She currently lives independently.             Past Medical History:  Diagnosis Date   Allergy     Arthritis of left knee     Asthma     Carotid artery stenosis 10/17/2012    stable report in 2018- "moderate on right, minimal on left"   Carpal tunnel syndrome 2018    left.   Cervical radiculopathy 05/16/2015    DDD C4-C5, C5-C6, and C6-C7.  Facet arthropathy throughout cervical spine.  Normal foraminal narrowing secondary to uncovertebral  arthropathy is seen bilaterally at C3-C4 and C5/C6   Chicken pox     COVID-19 03/04/2023   DDD (degenerative disc disease), lumbar 04/26/2015    Mild levoscoliosis, right anterior listhesis of L4 and L5.  DDD at every level.  Facet arthropathy at multiple levels.   Deviated septum     Glaucoma      both eyes, mild opened angle   Heart murmur     History of colon polyps     History of fall     History of frequent urinary tract infections      klebs- pansensitive. c. freundii - resitant to cefazolin  and augementin   Hypothyroidism     Intra-abdominal abscess (HCC) 03/10/2020   Iron deficiency anemia      prior pcp told her to take Fe 325 QD   Macular degeneration      h/o retinal edema   Mitral valve prolapse 08/27/2011   Murmur, cardiac 10/17/2012   Osteoarthritis     Osteoporosis     Pneumonia     Positive TB test       In college    Pseudophakia of both eyes     PVD (peripheral vascular disease) (HCC)     Rheumatic fever      89 years old    Skin lesion 12/31/2021   Syncope 05/17/2017   Thyroid  disease     Urinary incontinence     Vertigo      had been prescribed antivert   Vitamin D  deficiency                 Past Surgical History:  Procedure Laterality Date   ABDOMINAL AORTOGRAM N/A 04/26/2023    Procedure: ABDOMINAL AORTOGRAM;  Surgeon: Kyra Phy, MD;  Location: [facility] INVASIVE CV [facility];  Service: Cardiovascular;  Laterality: N/A;   CARPAL TUNNEL RELEASE Bilateral 2019   CATARACT EXTRACTION Bilateral 1997   COLONOSCOPY   2010   NASAL SEPTUM SURGERY   2007   RIGHT HEART CATH AND CORONARY ANGIOGRAPHY N/A 04/26/2023    Procedure: RIGHT HEART CATH AND CORONARY ANGIOGRAPHY;  Surgeon: Kyra Phy, MD;  Location: [facility] INVASIVE CV [facility];  Service: Cardiovascular;  Laterality: N/A;   WRIST FRACTURE SURGERY Right 1996  Social History:   Tobacco Use History  Social History        Tobacco Use  Smoking Status Former   Current packs/day: 0.00   Average packs/day: 0.5 packs/day for 13.0 years (6.5 ttl pk-yrs)   Types: Cigarettes   Start date: 10   Quit date: 1965   Years since quitting: 60.3  Smokeless Tobacco Never      Social History        Substance and Sexual Activity  Alcohol  Use Not Currently   Alcohol /week: 5.0 standard drinks of alcohol    Types: 5 Standard drinks or equivalent per week        Allergies       Allergies  Allergen Reactions   Augmentin  [Amoxicillin -Pot Clavulanate] Swelling and Rash      Swelling in face   Lactose Intolerance (Gi)     Penicillin G        Other Reaction(s): swelling of face                  Current Outpatient Medications  Medication Sig Dispense Refill   albuterol  (VENTOLIN  HFA) 108 (90 Base) MCG/ACT inhaler Inhale 2 puffs into the lungs every 6 (six) hours as needed for wheezing or shortness of breath. 1 each 0    cycloSPORINE  (RESTASIS ) 0.05 % ophthalmic emulsion Place 1 drop into both eyes 2 (two) times daily.       leflunomide (ARAVA) 10 MG tablet Take 10 mg by mouth daily.       levothyroxine  (SYNTHROID ) 50 MCG tablet Take 1 tablet (50 mcg total) by mouth daily before breakfast. 90 tablet 3   montelukast  (SINGULAIR ) 10 MG tablet Take 1 tablet (10 mg total) by mouth at bedtime. 90 tablet 1   Multiple Vitamins-Minerals (MULTIVITAMIN WITH MINERALS) tablet Take 1 tablet by mouth daily.       Multiple Vitamins-Minerals (PRESERVISION AREDS 2) CAPS Take 1 capsule by mouth 2 (two) times daily.       polyvinyl alcohol  (LIQUIFILM TEARS) 1.4 % ophthalmic solution Place 1 drop into both eyes as needed for dry eyes.       predniSONE  (DELTASONE ) 5 MG tablet Take 5 mg by mouth daily. Every other day       pregabalin  (LYRICA ) 25 MG capsule Take 1 capsule (25 mg total) by mouth at bedtime. 90 capsule 1      No current facility-administered medications for this visit.         (Not in a hospital admission)              Family History  Problem Relation Age of Onset   Arthritis Mother     COPD Mother     Prostate cancer Father     Arthritis Father     Hearing loss Father     Heart disease Father     Heart attack Father     Stroke Maternal Grandmother     Liver cancer Sister     Arthritis Brother     Prostate cancer Brother     COPD Brother     Heart disease Brother     Breast cancer Daughter     Blindness Maternal Grandfather     Stroke Maternal Grandfather     COPD Paternal Grandmother     Breast cancer Paternal Grandmother              Review of Systems:    Review of Systems  Constitutional:  Positive for malaise/fatigue.  Respiratory:  Positive for shortness of breath.   Cardiovascular:  Positive for chest pain.  Neurological:  Negative for dizziness.                   Physical Exam: BP (!) 152/82 (BP Location: Left Arm, Patient Position: Sitting, Cuff Size: Normal)   Pulse 79    Resp 20   Ht 5' (1.524 m)   Wt 117 lb 3.2 oz (53.2 kg)   SpO2 96% Comment: RA  BMI 22.89 kg/m  Physical Exam Constitutional:      General: She is not in acute distress.    Appearance: She is not ill-appearing.  HENT:     Head: Normocephalic and atraumatic.  Eyes:     Extraocular Movements: Extraocular movements intact.  Cardiovascular:     Rate and Rhythm: Normal rate.  Pulmonary:     Effort: Pulmonary effort is normal. No respiratory distress.  Abdominal:     General: Abdomen is flat. There is no distension.  Musculoskeletal:        General: Normal range of motion.     Cervical back: Normal range of motion.  Skin:    General: Skin is warm and dry.  Neurological:     General: No focal deficit present.     Mental Status: She is alert and oriented to person, place, and time.          Diagnostic Studies & Laboratory data:    Left Heart Catherization:  Intervention Echo: IMPRESSIONS     1. The aortic valve is tricuspid. There is severe calcifcation of the  aortic valve. There is severe thickening of the aortic valve. Aortic valve  regurgitation is trivial. Severe aortic valve stenosis. Aortic valve area,  by VTI measures 0.69 cm. Aortic  valve mean gradient measures 40.0 mmHg. Aortic valve Vmax measures 3.93  m/s.   2. Left ventricular ejection fraction, by estimation, is 55 to 60%. The  left ventricle has normal function. The left ventricle has no regional  wall motion abnormalities. There is moderate asymmetric left ventricular  hypertrophy of the basal-septal  segment. Left ventricular diastolic function could not be evaluated.   3. Right ventricular systolic function is low normal. The right  ventricular size is normal. There is normal pulmonary artery systolic  pressure. The estimated right ventricular systolic pressure is 26.8 mmHg.   4. Left atrial size was severely dilated.   5. Right atrial size was mildly dilated.   6. The mitral valve is degenerative.  Moderate mitral valve regurgitation.  Mild mitral stenosis. The mean mitral valve gradient is 4.0 mmHg with  average heart rate of 57 bpm. Severe mitral annular calcification.   7. The inferior vena cava is normal in size with greater than 50%  respiratory variability, suggesting right atrial pressure of 3 mmHg.     EKG: Sinus with occasional PVCs   I have independently reviewed the above radiologic studies and discussed with the patient    Recent Lab Findings: Recent Labs       Lab Results  Component Value Date    WBC 8.9 04/25/2023    HGB 10.5 (L) 04/26/2023    HCT 31.0 (L) 04/26/2023    PLT 182 04/25/2023    GLUCOSE 185 (H) 04/25/2023    CHOL 217 (A) 05/19/2017    HDL 68 05/19/2017    LDLDIRECT 120.0 06/16/2022    LDLCALC 131 05/19/2017    ALT 37 08/23/2022    AST 51 (H) 08/23/2022  NA 140 04/26/2023    K 3.5 04/26/2023    CL 109 (H) 04/25/2023    CREATININE 0.65 04/25/2023    BUN 15 04/25/2023    CO2 21 04/25/2023    TSH 3.04 06/16/2022    HGBA1C 4.9 08/08/2017            Assessment / Plan:   89yo female with severe aortic stenosis, and NYHA Class II symptoms.  She also has a history of CKD stage II and rheumatoid arthritis.  Her STS risk is 6.86.  On review of her CT, he anatomy would be suitable for a transfemoral TAVR.     We discussed the risks and benefits of a a transfemoral TAVR, and she is agreeable to proceed.  We also discussed the possibility of a sternotomy in the case of an emergency during the case.  Given her age, I do not think that she would have a meaningful recovery, and thus there will not be an option for bailout.  Her and her daughter agree with this assessment.           I  spent 40 minutes counseling the patient face to face.     Hilarie Lovely 06/27/2023 11:43 AM

## 2023-07-12 ENCOUNTER — Encounter (HOSPITAL_COMMUNITY)
Admission: RE | Disposition: A | Discharge status: Home / Self Care | Payer: Self-pay | Source: Home / Self Care | Attending: Internal Medicine

## 2023-07-12 ENCOUNTER — Inpatient Hospital Stay (HOSPITAL_COMMUNITY): Payer: Self-pay | Admitting: Physician Assistant

## 2023-07-12 ENCOUNTER — Inpatient Hospital Stay (HOSPITAL_COMMUNITY)
Admission: RE | Admit: 2023-07-12 | Discharge: 2023-07-13 | DRG: 267 | Disposition: A | Discharge status: Home / Self Care | Attending: Internal Medicine | Admitting: Internal Medicine

## 2023-07-12 ENCOUNTER — Inpatient Hospital Stay (HOSPITAL_COMMUNITY)

## 2023-07-12 ENCOUNTER — Other Ambulatory Visit: Payer: Self-pay | Admitting: Physician Assistant

## 2023-07-12 ENCOUNTER — Encounter (INDEPENDENT_AMBULATORY_CARE_PROVIDER_SITE_OTHER): Payer: Self-pay | Admitting: Ophthalmology

## 2023-07-12 ENCOUNTER — Inpatient Hospital Stay (HOSPITAL_COMMUNITY): Payer: Self-pay

## 2023-07-12 ENCOUNTER — Other Ambulatory Visit: Payer: Self-pay

## 2023-07-12 ENCOUNTER — Encounter (HOSPITAL_COMMUNITY): Payer: Self-pay | Admitting: Internal Medicine

## 2023-07-12 DIAGNOSIS — M353 Polymyalgia rheumatica: Secondary | ICD-10-CM | POA: Diagnosis present

## 2023-07-12 DIAGNOSIS — E039 Hypothyroidism, unspecified: Secondary | ICD-10-CM

## 2023-07-12 DIAGNOSIS — I350 Nonrheumatic aortic (valve) stenosis: Secondary | ICD-10-CM

## 2023-07-12 DIAGNOSIS — Z87891 Personal history of nicotine dependence: Secondary | ICD-10-CM

## 2023-07-12 DIAGNOSIS — Z79890 Hormone replacement therapy (postmenopausal): Secondary | ICD-10-CM | POA: Diagnosis not present

## 2023-07-12 DIAGNOSIS — I342 Nonrheumatic mitral (valve) stenosis: Secondary | ICD-10-CM | POA: Diagnosis not present

## 2023-07-12 DIAGNOSIS — I340 Nonrheumatic mitral (valve) insufficiency: Secondary | ICD-10-CM | POA: Diagnosis not present

## 2023-07-12 DIAGNOSIS — Z006 Encounter for examination for normal comparison and control in clinical research program: Secondary | ICD-10-CM

## 2023-07-12 DIAGNOSIS — I447 Left bundle-branch block, unspecified: Secondary | ICD-10-CM | POA: Diagnosis not present

## 2023-07-12 DIAGNOSIS — Z87440 Personal history of urinary (tract) infections: Secondary | ICD-10-CM | POA: Diagnosis not present

## 2023-07-12 DIAGNOSIS — N390 Urinary tract infection, site not specified: Secondary | ICD-10-CM | POA: Diagnosis present

## 2023-07-12 DIAGNOSIS — Z7982 Long term (current) use of aspirin: Secondary | ICD-10-CM

## 2023-07-12 DIAGNOSIS — Z952 Presence of prosthetic heart valve: Secondary | ICD-10-CM

## 2023-07-12 DIAGNOSIS — N182 Chronic kidney disease, stage 2 (mild): Secondary | ICD-10-CM | POA: Diagnosis present

## 2023-07-12 DIAGNOSIS — J45909 Unspecified asthma, uncomplicated: Secondary | ICD-10-CM | POA: Diagnosis present

## 2023-07-12 DIAGNOSIS — I080 Rheumatic disorders of both mitral and aortic valves: Principal | ICD-10-CM | POA: Diagnosis present

## 2023-07-12 DIAGNOSIS — M0600 Rheumatoid arthritis without rheumatoid factor, unspecified site: Secondary | ICD-10-CM | POA: Diagnosis present

## 2023-07-12 DIAGNOSIS — E785 Hyperlipidemia, unspecified: Secondary | ICD-10-CM | POA: Diagnosis present

## 2023-07-12 DIAGNOSIS — I059 Rheumatic mitral valve disease, unspecified: Secondary | ICD-10-CM | POA: Diagnosis present

## 2023-07-12 DIAGNOSIS — Z7952 Long term (current) use of systemic steroids: Secondary | ICD-10-CM | POA: Diagnosis not present

## 2023-07-12 HISTORY — DX: Presence of prosthetic heart valve: Z95.2

## 2023-07-12 HISTORY — PX: INTRAOPERATIVE TRANSTHORACIC ECHOCARDIOGRAM: SHX6523

## 2023-07-12 LAB — POCT I-STAT, CHEM 8
BUN: 13 mg/dL (ref 8–23)
Calcium, Ion: 1.13 mmol/L — ABNORMAL LOW (ref 1.15–1.40)
Chloride: 105 mmol/L (ref 98–111)
Creatinine, Ser: 0.5 mg/dL (ref 0.44–1.00)
Glucose, Bld: 109 mg/dL — ABNORMAL HIGH (ref 70–99)
HCT: 29 % — ABNORMAL LOW (ref 36.0–46.0)
Hemoglobin: 9.9 g/dL — ABNORMAL LOW (ref 12.0–15.0)
Potassium: 3.3 mmol/L — ABNORMAL LOW (ref 3.5–5.1)
Sodium: 141 mmol/L (ref 135–145)
TCO2: 23 mmol/L (ref 22–32)

## 2023-07-12 LAB — ECHOCARDIOGRAM LIMITED
AR max vel: 0.68 cm2
AV Area VTI: 0.61 cm2
AV Area mean vel: 0.61 cm2
AV Mean grad: 31.7 mmHg
AV Peak grad: 38.3 mmHg
Ao pk vel: 3.09 m/s
MV VTI: 1.42 cm2

## 2023-07-12 LAB — POCT ACTIVATED CLOTTING TIME: Activated Clotting Time: 262 s

## 2023-07-12 LAB — ABO/RH: ABO/RH(D): A POS

## 2023-07-12 MED ORDER — TRAMADOL HCL 50 MG PO TABS
50.0000 mg | ORAL_TABLET | ORAL | Status: DC | PRN
Start: 2023-07-12 — End: 2023-07-13

## 2023-07-12 MED ORDER — HEPARIN (PORCINE) IN NACL 1000-0.9 UT/500ML-% IV SOLN
INTRAVENOUS | Status: DC | PRN
Start: 2023-07-12 — End: 2023-07-12
  Administered 2023-07-12: 1500 mL

## 2023-07-12 MED ORDER — SODIUM CHLORIDE 0.9 % IV SOLN: 250.0000 mL | INTRAVENOUS | Status: DC | PRN

## 2023-07-12 MED ORDER — LIDOCAINE HCL (PF) 1 % IJ SOLN
INTRAMUSCULAR | Status: DC | PRN
  Administered 2023-07-12: 5 mL

## 2023-07-12 MED ORDER — FENTANYL CITRATE (PF) 100 MCG/2ML IJ SOLN
INTRAMUSCULAR | Status: AC
  Filled 2023-07-12: qty 2

## 2023-07-12 MED ORDER — LACTATED RINGERS IV SOLN
INTRAVENOUS | Status: DC | PRN
Start: 2023-07-12 — End: 2023-07-12

## 2023-07-12 MED ORDER — CHLORHEXIDINE GLUCONATE 4 % EX SOLN: 30.0000 mL | CUTANEOUS | Status: DC

## 2023-07-12 MED ORDER — ACETAMINOPHEN 325 MG PO TABS
650.0000 mg | ORAL_TABLET | Freq: Four times a day (QID) | ORAL | Status: DC | PRN
  Administered 2023-07-12: 650 mg via ORAL
  Filled 2023-07-12: qty 2

## 2023-07-12 MED ORDER — SODIUM CHLORIDE 0.9 % IV SOLN: INTRAVENOUS | Status: AC

## 2023-07-12 MED ORDER — PROTAMINE SULFATE 10 MG/ML IV SOLN
INTRAVENOUS | Status: DC | PRN
  Administered 2023-07-12: 80 mg via INTRAVENOUS

## 2023-07-12 MED ORDER — POTASSIUM CHLORIDE CRYS ER 20 MEQ PO TBCR
40.0000 meq | EXTENDED_RELEASE_TABLET | Freq: Once | ORAL | Status: AC
Start: 2023-07-12 — End: 2023-07-12
  Administered 2023-07-12: 40 meq via ORAL
  Filled 2023-07-12: qty 2

## 2023-07-12 MED ORDER — FENTANYL CITRATE (PF) 100 MCG/2ML IJ SOLN
INTRAMUSCULAR | Status: DC | PRN
  Administered 2023-07-12: 25 ug via INTRAVENOUS

## 2023-07-12 MED ORDER — NITROGLYCERIN IN D5W 200-5 MCG/ML-% IV SOLN: 0.0000 ug/min | INTRAVENOUS | Status: DC

## 2023-07-12 MED ORDER — LEVOTHYROXINE SODIUM 50 MCG PO TABS
50.0000 ug | ORAL_TABLET | Freq: Every day | ORAL | Status: DC
  Administered 2023-07-13: 50 ug via ORAL
  Filled 2023-07-12: qty 1

## 2023-07-12 MED ORDER — CHLORHEXIDINE GLUCONATE 4 % EX SOLN: 60.0000 mL | Freq: Once | CUTANEOUS | Status: DC

## 2023-07-12 MED ORDER — SODIUM CHLORIDE 0.9% FLUSH: 3.0000 mL | INTRAVENOUS | Status: DC | PRN

## 2023-07-12 MED ORDER — CHLORHEXIDINE GLUCONATE 0.12 % MT SOLN
15.0000 mL | Freq: Once | OROMUCOSAL | Status: AC
  Administered 2023-07-12: 15 mL via OROMUCOSAL
  Filled 2023-07-12: qty 15

## 2023-07-12 MED ORDER — LEFLUNOMIDE 10 MG PO TABS
10.0000 mg | ORAL_TABLET | Freq: Every day | ORAL | Status: DC
  Administered 2023-07-12 – 2023-07-13 (×2): 10 mg via ORAL
  Filled 2023-07-12 (×2): qty 1

## 2023-07-12 MED ORDER — SODIUM CHLORIDE 0.9% FLUSH
3.0000 mL | Freq: Two times a day (BID) | INTRAVENOUS | Status: DC
  Administered 2023-07-12 – 2023-07-13 (×2): 3 mL via INTRAVENOUS

## 2023-07-12 MED ORDER — CLEVIDIPINE BUTYRATE 0.5 MG/ML IV EMUL
INTRAVENOUS | Status: DC | PRN
  Administered 2023-07-12: 2 mg/h via INTRAVENOUS

## 2023-07-12 MED ORDER — SODIUM CHLORIDE 0.9 % IV SOLN: INTRAVENOUS | Status: DC

## 2023-07-12 MED ORDER — MONTELUKAST SODIUM 10 MG PO TABS
10.0000 mg | ORAL_TABLET | Freq: Every day | ORAL | Status: DC
  Administered 2023-07-12: 10 mg via ORAL
  Filled 2023-07-12: qty 1

## 2023-07-12 MED ORDER — HEPARIN SODIUM (PORCINE) 1000 UNIT/ML IJ SOLN
INTRAMUSCULAR | Status: DC | PRN
  Administered 2023-07-12: 8000 [IU] via INTRAVENOUS

## 2023-07-12 MED ORDER — ASPIRIN 81 MG PO CHEW
81.0000 mg | CHEWABLE_TABLET | Freq: Every day | ORAL | Status: DC
  Administered 2023-07-13: 81 mg via ORAL
  Filled 2023-07-12: qty 1

## 2023-07-12 MED ORDER — IODIXANOL 320 MG/ML IV SOLN
INTRAVENOUS | Status: DC | PRN
  Administered 2023-07-12: 60 mL

## 2023-07-12 MED ORDER — ACETAMINOPHEN 325 MG PO TABS
ORAL_TABLET | ORAL | Status: AC
  Administered 2023-07-12: 650 mg via ORAL
  Filled 2023-07-12: qty 2

## 2023-07-12 MED ORDER — MORPHINE SULFATE (PF) 2 MG/ML IV SOLN: 1.0000 mg | INTRAVENOUS | Status: DC | PRN

## 2023-07-12 MED ORDER — PREGABALIN 25 MG PO CAPS
25.0000 mg | ORAL_CAPSULE | Freq: Every day | ORAL | Status: DC
  Administered 2023-07-12: 25 mg via ORAL
  Filled 2023-07-12: qty 1

## 2023-07-12 MED ORDER — ONDANSETRON HCL 4 MG/2ML IJ SOLN: 4.0000 mg | Freq: Four times a day (QID) | INTRAMUSCULAR | Status: DC | PRN

## 2023-07-12 MED ORDER — VANCOMYCIN HCL IN DEXTROSE 1-5 GM/200ML-% IV SOLN
1000.0000 mg | Freq: Once | INTRAVENOUS | Status: AC
  Administered 2023-07-12: 1000 mg via INTRAVENOUS
  Filled 2023-07-12: qty 200

## 2023-07-12 MED ORDER — NOREPINEPHRINE 4 MG/250ML-% IV SOLN
0.0000 ug/min | INTRAVENOUS | Status: DC
  Filled 2023-07-12: qty 250

## 2023-07-12 MED ORDER — ACETAMINOPHEN 650 MG RE SUPP: 650.0000 mg | Freq: Four times a day (QID) | RECTAL | Status: DC | PRN

## 2023-07-12 MED ORDER — LIDOCAINE HCL (PF) 1 % IJ SOLN
INTRAMUSCULAR | Status: AC
  Filled 2023-07-12: qty 30

## 2023-07-12 MED ORDER — OXYCODONE HCL 5 MG PO TABS: 5.0000 mg | ORAL_TABLET | ORAL | Status: DC | PRN

## 2023-07-12 NOTE — Transfer of Care (Signed)
 Immediate Anesthesia Transfer of Care Note  Patient: Margaret [name]  Procedure(s) Performed: Transcatheter Aortic Valve Replacement, Transfemoral (Right) ECHOCARDIOGRAM, TRANSTHORACIC  Patient Location: PACU  Anesthesia Type:MAC  Level of Consciousness: awake and patient cooperative  Airway & Oxygen Therapy: Patient Spontanous Breathing and Patient connected to face mask  Post-op Assessment: Report given to RN and Post -op Vital signs reviewed and stable  Post vital signs: Reviewed and stable  Last Vitals:  Vitals Value Taken Time  BP 114/53 07/12/23 1100  Temp    Pulse 52 07/12/23 1101  Resp 15 07/12/23 1101  SpO2 93 % 07/12/23 1101  Vitals shown include unfiled device data.  Last Pain:  Vitals:   07/12/23 1013  TempSrc:   PainSc: Asleep         Complications: There were no known notable events for this encounter.

## 2023-07-12 NOTE — Discharge Summary (Addendum)
 [facility]   MULTIDISCIPLINARY HEART VALVE TEAM  Discharge Summary    Patient ID: Margaret [name] MRN: [id number]; DOB: 12/17/1929  Admit date: 07/12/2023 Discharge date: 07/13/2023  PCP:  Mariel Shope, DO  [facility] Cardiologist:  Sheryle Donning, MD  [facility] Structural heart: Kyra Phy, MD [facility] Electrophysiologist:  None   Discharge Diagnoses    Principal Problem:   S/P TAVR (transcatheter aortic valve replacement) Active Problems:   Hypothyroidism   Mitral valve disease   Seronegative rheumatoid arthritis (HCC)   Polymyalgia rheumatica (HCC)   Frequent UTI   Severe aortic stenosis   Allergies Allergies  Allergen Reactions   Augmentin  [Amoxicillin -Pot Clavulanate] Swelling and Rash    Swelling in face   Lactose Intolerance (Gi) Diarrhea    bloating   Penicillin G Swelling    swelling of face    Diagnostic Studies/Procedures    [facility]  TAVR OPERATIVE NOTE     Date of Procedure:                07/12/2023   Preoperative Diagnosis:      Severe Aortic Stenosis    Postoperative Diagnosis:    Same    Procedure:        Transcatheter Aortic Valve Replacement - Transfemoral Approach             Edwards Sapien 3 Resilia THV (size 23 mm, model # [id number])              Co-Surgeons:                         Bartley Lightning, MD, Starleen Eastern, MD and Alyssa Backbone, MD Anesthesiologist:                  Harwood Lingo   Echocardiographer:              Croitoru   Pre-operative Echo Findings: Severe aortic stenosis Normal left ventricular systolic function   Post-operative Echo Findings: Trace paravalvular leak Normal left ventricular systolic function   Left Heart Catheterization Findings: Left ventricular end-diastolic pressure of _____________    Echo 07/13/23: completed but pending formal read at the time of discharge   History of Present Illness     Margaret [name] is a 89 y.o.  female with a history of hypothyroidism, diastolic dysfunction, HLD, rheumatoid arthritis on leflunomide, severe MAC with moderate MR and severe AS who presented to [facility] on 07/12/23 for planned TAVR.   Patient was seen by her primary cardiologist recently and reported increasing shortness of breath with exertion. Echo 04/08/23 showed EF 55%, moderate asymmetric LHV, and severe AS with mean grad 40 mmHg and severe MAC with moderate MR & mild MS. L/RHC 04/26/23 showed minimal nonobstructive coronary artery disease of left dominant system.   The patient was evaluated by the multidisciplinary valve team and felt to have severe, symptomatic aortic stenosis and to be a suitable candidate for TAVR, which was set up for 07/12/23.   Hospital Course     Consultants: none   Severe AS:  -- S/p successful TAVR with a 23 mm Edwards Sapien 3 Ultra Resilia THV via the TF approach on 07/12/23.  -- Post operative echo completed but pending formal read. -- Groin sites are stable.  -- ECG with sinus and no high grade heart block. -- Started on a baby aspirin  81mg  daily.  -- Met with cardiac rehab to  discuss CRP phase II.  -- Plan for discharge home today with close follow up in the outpatient setting.   Elevated BP without a history of hypertension: -- Will have her continue to monitor at home and discuss in the outpatient setting.   _____________  Discharge Vitals Blood pressure (!) 157/60, pulse 76, temperature 97.9 F (36.6 C), temperature source Oral, resp. rate 16, height 5' (1.524 m), weight 51.3 kg, SpO2 100%.  Filed Weights   07/11/23 1300 07/12/23 0706 07/13/23 0307  Weight: 53.2 kg 53.2 kg 51.3 kg     GEN: Well nourished, well developed in no acute distress NECK: No JVD CARDIAC: RRR, no murmurs, rubs, gallops RESPIRATORY:  Clear to auscultation without rales, wheezing or rhonchi  ABDOMEN: Soft, non-tender, non-distended EXTREMITIES:  No edema; No deformity.  Groin sites clear without hematoma or  ecchymosis.    Disposition   Pt is being discharged home today in good condition.  Follow-up Plans & Appointments     Follow-up Information     Ardia Kraft, PA-C. Go on 07/25/2023.   Specialties: Cardiology, Radiology Why: @ 10:20am, please arrive at least 15 minutes early Contact information: [address] [place] [address] [phone]                Discharge Instructions     Amb Referral to Cardiac Rehabilitation   Complete by: As directed    Diagnosis: Valve Replacement   Valve: Aortic   After initial evaluation and assessments completed: Virtual Based Care may be provided alone or in conjunction with Phase 2 Cardiac Rehab based on patient barriers.: Yes   Intensive Cardiac Rehabilitation (ICR) [facility] location only OR Traditional Cardiac Rehabilitation (TCR) *If criteria for ICR are not met will enroll in TCR [facility] only): Yes       Discharge Medications   Allergies as of 07/13/2023       Reactions   Augmentin  [amoxicillin -pot Clavulanate] Swelling, Rash   Swelling in face   Lactose Intolerance (gi) Diarrhea   bloating   Penicillin G Swelling   swelling of face        Medication List     TAKE these medications    acetaminophen  500 MG tablet Commonly known as: TYLENOL  Take 500 mg by mouth 2 (two) times daily.   albuterol  108 (90 Base) MCG/ACT inhaler Commonly known as: VENTOLIN  HFA Inhale 2 puffs into the lungs every 6 (six) hours as needed for wheezing or shortness of breath.   aspirin  81 MG chewable tablet Chew 1 tablet (81 mg total) by mouth daily. Start taking on: Jul 14, 2023   CALCIUM 600 + D PO Take 1 tablet by mouth daily.   cycloSPORINE  0.05 % ophthalmic emulsion Commonly known as: RESTASIS  Place 1 drop into both eyes 2 (two) times daily.   diclofenac Sodium 1 % Gel Commonly known as: VOLTAREN Apply 1 Application topically 2 (two) times daily.   fluocinonide  ointment 0.05 % Commonly known as: LIDEX  Apply 1  Application topically 2 (two) times daily.   leflunomide 10 MG tablet Commonly known as: ARAVA Take 10 mg by mouth daily.   levocetirizine 5 MG tablet Commonly known as: XYZAL Take 5 mg by mouth daily as needed for allergies.   levothyroxine  50 MCG tablet Commonly known as: SYNTHROID  Take 1 tablet (50 mcg total) by mouth daily before breakfast.   montelukast  10 MG tablet Commonly known as: SINGULAIR  Take 1 tablet (10 mg total) by mouth at bedtime.   predniSONE  5 MG  tablet Commonly known as: DELTASONE  Take 5 mg by mouth daily as needed (arthritis).   pregabalin  25 MG capsule Commonly known as: Lyrica  Take 1 capsule (25 mg total) by mouth at bedtime.   PreserVision AREDS 2+Multi Vit Caps Take 1 tablet by mouth daily.   PreserVision AREDS 2 Caps Take 1 capsule by mouth every evening.   PROBIOTIC PO Take 1 capsule by mouth daily.   Systane Complete PF 0.6 % Soln Generic drug: Propylene Glycol (PF) Place 1 drop into both eyes 2 (two) times daily as needed (dry eyes).          Outstanding Labs/Studies   none ______________________  Duration of Discharge Encounter: APP Time: 15 minutes    Signed, Abagail Hoar, PA-C 07/13/2023, 10:39 AM [phone]   ATTENDING ATTESTATION:  After conducting a review of all available clinical information with the care team, interviewing the patient, and performing a physical exam, I agree with the findings and plan described in this note.   GEN: No acute distress.   HEENT:  MMM, no JVD, no scleral icterus Cardiac: RRR, no murmurs, rubs, or gallops.  Respiratory: Clear to auscultation bilaterally. GI: Soft, nontender, non-distended  MS: No edema; No deformity. Neuro:  Nonfocal  Vasc:  +2 radial pulses; access sites intact  Patient doing well after TAVR with stable access sites, no evidence of stroke, and no conduction abnormalities.  Follow up echocardiogram with planned discharge and close hospital follow up.   Patient's left bundle branch block has resolved.  Discharged today after echocardiogram.  Continue aspirin .  APP discharge time:15 MD discharge time:20  Alyssa Backbone, MD Pager [phone]

## 2023-07-12 NOTE — Anesthesia Preprocedure Evaluation (Signed)
 Anesthesia Evaluation  Patient identified by MRN, date of birth, ID band Patient awake    Reviewed: Allergy & Precautions, NPO status , Patient's Chart, lab work & pertinent test results  Airway Mallampati: II  TM Distance: >3 FB Neck ROM: Full    Dental  (+) Dental Advisory Given   Pulmonary asthma , former smoker   breath sounds clear to auscultation       Cardiovascular + Peripheral Vascular Disease  + Valvular Problems/Murmurs AS  Rhythm:Regular Rate:Normal + Systolic murmurs    Neuro/Psych  Neuromuscular disease    GI/Hepatic negative GI ROS, Neg liver ROS,,,  Endo/Other  Hypothyroidism    Renal/GU negative Renal ROS     Musculoskeletal  (+) Arthritis ,    Abdominal   Peds  Hematology negative hematology ROS (+)   Anesthesia Other Findings   Reproductive/Obstetrics                             Anesthesia Physical Anesthesia Plan  ASA: 4  Anesthesia Plan: MAC   Post-op Pain Management: Minimal or no pain anticipated   Induction:   PONV Risk Score and Plan: 2 and Treatment may vary due to age or medical condition and Ondansetron   Airway Management Planned: Natural Airway and Simple Face Mask  Additional Equipment:   Intra-op Plan:   Post-operative Plan:   Informed Consent: I have reviewed the patients History and Physical, chart, labs and discussed the procedure including the risks, benefits and alternatives for the proposed anesthesia with the patient or authorized representative who has indicated his/her understanding and acceptance.     Dental advisory given  Plan Discussed with: CRNA  Anesthesia Plan Comments:        Anesthesia Quick Evaluation

## 2023-07-12 NOTE — Interval H&P Note (Signed)
 History and Physical Interval Note:  07/12/2023 9:20 AM  Margaret [name]  has presented today for surgery, with the diagnosis of Severe Aortic Stenosis.  The various methods of treatment have been discussed with the patient and family. After consideration of risks, benefits and other options for treatment, the patient has consented to  Procedure(s): Transcatheter Aortic Valve Replacement, Transfemoral (Right) ECHOCARDIOGRAM, TRANSTHORACIC (N/A) as a surgical intervention.  The patient's history has been reviewed, patient examined, no change in status, stable for surgery.  I have reviewed the patient's chart and labs.  Questions were answered to the patient's satisfaction.     Hugo Lybrand K Maclean Foister

## 2023-07-12 NOTE — Op Note (Signed)
 [facility]  TAVR OPERATIVE NOTE   Date of Procedure:  07/12/2023  Preoperative Diagnosis: Severe Aortic Stenosis   Postoperative Diagnosis: Same   Procedure:   Transcatheter Aortic Valve Replacement - Transfemoral Approach  Edwards Sapien 3 Resilia THV (size 23 mm, model # [id number])   Co-Surgeons:   Bartley Lightning, MD, Starleen Eastern, MD and Alyssa Backbone, MD Anesthesiologist:  Harwood Lingo  Echocardiographer:  Croitoru  Pre-operative Echo Findings: Severe aortic stenosis Normal left ventricular systolic function  Post-operative Echo Findings: Trace paravalvular leak Normal left ventricular systolic function  Left Heart Catheterization Findings: Left ventricular end-diastolic pressure of   BRIEF CLINICAL NOTE AND INDICATIONS FOR SURGERY  The patient is a 89 year old female with a history of hypothyroidism, hyperlipidemia, rheumatoid arthritis on leflunomide, 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  During the course of the patient's preoperative work up they have been evaluated comprehensively by a multidisciplinary team of specialists coordinated through the Multidisciplinary [facility] Valve Clinic in the [facility].  They have been demonstrated to suffer from symptomatic severe aortic stenosis as noted above. The patient has been counseled extensively as to the relative risks and benefits of all options for the treatment of severe aortic stenosis including long term medical therapy, conventional surgery for aortic valve replacement, and transcatheter aortic valve replacement.  The patient has been independently evaluated by Dr. Sherene Dilling with CT surgery and they are felt to be at high risk for conventional surgical aortic valve replacement. The surgeon indicated the patient  would be a poor candidate for conventional surgery. Based upon review of all of the patient's preoperative diagnostic tests they are felt to be candidate for transcatheter aortic valve replacement using the transfemoral approach as an alternative to high risk conventional surgery.    Following the decision to proceed with transcatheter aortic valve replacement, a discussion has been held regarding what types of management strategies would be attempted intraoperatively in the event of life-threatening complications, including whether or not the patient would be considered a candidate for the use of cardiopulmonary bypass and/or conversion to open sternotomy for attempted surgical intervention.  The patient has been advised of a variety of complications that might develop peculiar to this approach including but not limited to risks of death, stroke, paravalvular leak, aortic dissection or other major vascular complications, aortic annulus rupture, device embolization, cardiac rupture or perforation, acute myocardial infarction, arrhythmia, heart block or bradycardia requiring permanent pacemaker placement, congestive heart failure, respiratory failure, renal failure, pneumonia, infection, other late complications related to structural valve deterioration or migration, or other complications that might ultimately cause a temporary or permanent loss of functional independence or other long term morbidity.  The patient provides full informed consent for the procedure as described and all questions were answered preoperatively.    DETAILS OF THE OPERATIVE PROCEDURE  PREPARATION:   The patient is brought to the operating room on the above mentioned date and central monitoring was established by the anesthesia team. The patient is placed in the supine position on the operating table.  Intravenous antibiotics are administered. Conscious sedation is used.   Baseline transthoracic echocardiogram was performed. The  patient's chest, abdomen, both groins, and both lower extremities are prepared and draped in a sterile manner. A time out procedure is performed.   PERIPHERAL ACCESS:   Using the  modified Seldinger technique, femoral arterial and venous access were obtained with placement of a 6 Fr sheath in the left common femoral artery  using u/s guidance.  A pigtail diagnostic catheter was passed through the femoral arterial sheath under fluoroscopic guidance into the aortic root. Aortic root angiography was performed in order to determine the optimal angiographic angle for valve deployment.  TRANSFEMORAL ACCESS:  A micropuncture kit was used to gain access to the right common femoral artery using u/s guidance. Position confirmed with angiography. Pre-closure with double ProGlide closure devices. The patient was heparinized systemically and ACT verified > 250 seconds.    A 14 Fr transfemoral E-sheath was introduced into the right common femoral artery after progressively dilating over an Amplatz superstiff wire. An AL-1 catheter was used to direct a straight-tip exchange length wire across the native aortic valve into the left ventricle. This was exchanged out for a pigtail catheter and position was confirmed in the LV apex. Simultaneous left ventricular, aortic, and left ventricular end-diastolic pressures were recorded.  The pigtail catheter was then exchanged for an Safari wire in the LV apex.  Direct LV pacing thresholds were assessed and found to be adequate.   BALLOON AORTIC VALVULOPLASTY: Given invasive mean gradient of 70 mmHg, balloon aortic valvuloplasty with a 10 mm balloon was performed with direct left ventricular pacing.  TRANSCATHETER HEART VALVE DEPLOYMENT:  An Edwards Sapien 3 THV (size 23 mm) was prepared and crimped per manufacturer's guidelines, and the proper orientation of the valve is confirmed on the [employer] delivery system. The valve was advanced through the introducer sheath  using normal technique until in an appropriate position in the abdominal aorta beyond the sheath tip. The balloon was then retracted and using the fine-tuning wheel was centered on the valve. The valve was then advanced across the aortic arch using appropriate flexion of the catheter. The valve was carefully positioned across the aortic valve annulus. The Commander catheter was retracted using normal technique. Once final position of the valve has been confirmed by angiographic assessment, the valve is deployed while temporarily holding ventilation and during rapid ventricular pacing to maintain systolic blood pressure < 50 mmHg and pulse pressure < 10 mmHg. The balloon inflation is held for >3 seconds after reaching full deployment volume. Once the balloon has fully deflated the balloon is retracted into the ascending aorta and valve function is assessed using TTE. There is felt to be trace paravalvular leak and no central aortic insufficiency.  The patient's hemodynamic recovery following valve deployment is good.  The deployment balloon and guidewire are both removed. Echo demostrated acceptable post-procedural gradients, stable mitral valve function, and no AI.   PROCEDURE COMPLETION:  The sheath was then removed and closure devices were completed. Protamine was administered once femoral arterial repair was complete. The temporary pacemaker, pigtail catheters and femoral sheaths were removed with a Mynx closure device placed in the artery and manual pressure used for venous hemostasis.    The patient tolerated the procedure well and is transported to the surgical intensive care in stable condition. There were no immediate intraoperative complications. All sponge instrument and needle counts are verified correct at completion of the operation.   No blood products were administered during the operation.  The patient received a total of 60 mL of intravenous contrast during the procedure.  Lakeena Downie K Eila Runyan  MD 07/12/2023 10:58 AM

## 2023-07-12 NOTE — Op Note (Signed)
 Signed       [facility] VASCULAR CENTER   MULTIDISCIPLINARY HEART VALVE TEAM     TAVR OPERATIVE NOTE     Date of Procedure:                07/12/2023   Preoperative Diagnosis:      Severe Aortic Stenosis    Postoperative Diagnosis:    Same    Procedure:        Transcatheter Aortic Valve Replacement - Transfemoral Approach             Edwards Sapien 3 Resilia THV (size 23 mm, model # [id number])              Co-Surgeons:                         Bartley Lightning, MD, Starleen Eastern, MD and Alyssa Backbone, MD Anesthesiologist:                  Harwood Lingo   Echocardiographer:              Croitoru   Pre-operative Echo Findings: Severe aortic stenosis Normal left ventricular systolic function   Post-operative Echo Findings: Trace paravalvular leak Normal left ventricular systolic function   Left Heart Catheterization Findings: Left ventricular end-diastolic pressure of     BRIEF CLINICAL NOTE AND INDICATIONS FOR SURGERY   89yo female with severe aortic stenosis, and NYHA Class II symptoms.  She also has a history of CKD stage II and rheumatoid arthritis.  Her STS risk is 6.86.  On review of her CT, he anatomy would be suitable for a transfemoral TAVR.     We discussed the risks and benefits of a a transfemoral TAVR, and she is agreeable to proceed.  We also discussed the possibility of a sternotomy in the case of an emergency during the case.  Given her age, I do not think that she would have a meaningful recovery, and thus there will not be an option for bailout.  Her and her daughter agree with this assessment.        DETAILS OF THE OPERATIVE PROCEDURE  PREPARATION:   The patient is brought to the operating room on the above mentioned date and central monitoring was established by the anesthesia team. The patient is placed in the supine position on the operating table.  Intravenous antibiotics are administered. Conscious sedation is used.    Baseline  transthoracic echocardiogram was performed. The patient's chest, abdomen, both groins, and both lower extremities are prepared and draped in a sterile manner. A time out procedure is performed.     PERIPHERAL ACCESS:   Using the modified Seldinger technique, femoral arterial and venous access were obtained with placement of a 6 Fr sheath in the left common femoral artery  using u/s guidance.  A pigtail diagnostic catheter was passed through the femoral arterial sheath under fluoroscopic guidance into the aortic root. Aortic root angiography was performed in order to determine the optimal angiographic angle for valve deployment.   TRANSFEMORAL ACCESS:  A micropuncture kit was used to gain access to the right common femoral artery using u/s guidance. Position confirmed with angiography. Pre-closure with double ProGlide closure devices. The patient was heparinized systemically and ACT verified > 250 seconds.     A 14 Fr transfemoral E-sheath was introduced into the right common femoral artery after progressively dilating over an Amplatz superstiff wire. An AL-1 catheter was used  to direct a straight-tip exchange length wire across the native aortic valve into the left ventricle. This was exchanged out for a pigtail catheter and position was confirmed in the LV apex. Simultaneous left ventricular, aortic, and left ventricular end-diastolic pressures were recorded.  The pigtail catheter was then exchanged for an Safari wire in the LV apex.  Direct LV pacing thresholds were assessed and found to be adequate.    BALLOON AORTIC VALVULOPLASTY: Given invasive mean gradient of 70 mmHg, balloon aortic valvuloplasty with a 10 mm balloon was performed with direct left ventricular pacing.   TRANSCATHETER HEART VALVE DEPLOYMENT:  An Edwards Sapien 3 THV (size 23 mm) was prepared and crimped per manufacturer's guidelines, and the proper orientation of the valve is confirmed on the [employer] delivery system.  The valve was advanced through the introducer sheath using normal technique until in an appropriate position in the abdominal aorta beyond the sheath tip. The balloon was then retracted and using the fine-tuning wheel was centered on the valve. The valve was then advanced across the aortic arch using appropriate flexion of the catheter. The valve was carefully positioned across the aortic valve annulus. The Commander catheter was retracted using normal technique. Once final position of the valve has been confirmed by angiographic assessment, the valve is deployed while temporarily holding ventilation and during rapid ventricular pacing to maintain systolic blood pressure < 50 mmHg and pulse pressure < 10 mmHg. The balloon inflation is held for >3 seconds after reaching full deployment volume. Once the balloon has fully deflated the balloon is retracted into the ascending aorta and valve function is assessed using TTE. There is felt to be trace paravalvular leak and no central aortic insufficiency.  The patient's hemodynamic recovery following valve deployment is good.  The deployment balloon and guidewire are both removed. Echo demostrated acceptable post-procedural gradients, stable mitral valve function, and no AI.    PROCEDURE COMPLETION:  The sheath was then removed and closure devices were completed. Protamine was administered once femoral arterial repair was complete. The temporary pacemaker, pigtail catheters and femoral sheaths were removed with a Mynx closure device placed in the artery and manual pressure used for venous hemostasis.     The patient tolerated the procedure well and is transported to the surgical intensive care in stable condition. There were no immediate intraoperative complications. All sponge instrument and needle counts are verified correct at completion of the operation.    No blood products were administered during the operation.   The patient received a total of 60 mL of  intravenous contrast during the procedure.

## 2023-07-12 NOTE — Progress Notes (Signed)
  HEART AND VASCULAR CENTER   MULTIDISCIPLINARY HEART VALVE TEAM  Patient doing well s/p TAVR. She is hemodynamically stable. Groin sites stable. ECG with new LBBB but no high grade block. Potassium 3.3. Will supplement with Kdur 40meq. Plan to transfer to from cath lab holding to 4E when bed available. Early ambulation after bedrest completed and hopeful discharge over the next 24-48 hours.   Abagail Hoar PA-C  [facility]  Pager [phone]

## 2023-07-12 NOTE — Progress Notes (Signed)
 Pt received from Cath lab, she is alert, V/S obtained, CCMD notified, CHG bath given, B/L groins are level 0, all needs met, call bell in reach.   07/12/23 1329  Vitals  Temp 98.3 F (36.8 C)  Temp Source Oral  BP (!) 155/62  MAP (mmHg) 92  BP Location Right Arm  BP Method Automatic  Patient Position (if appropriate) Lying  Pulse Rate (!) 42  Pulse Rate Source Monitor  ECG Heart Rate (!) 55  Resp 15  Level of Consciousness  Level of Consciousness Alert  Oxygen Therapy  SpO2 100 %  O2 Device Room Air  Pain Assessment  Pain Scale 0-10  Pain Score 0

## 2023-07-13 ENCOUNTER — Inpatient Hospital Stay (HOSPITAL_COMMUNITY)

## 2023-07-13 DIAGNOSIS — Z952 Presence of prosthetic heart valve: Secondary | ICD-10-CM

## 2023-07-13 DIAGNOSIS — I342 Nonrheumatic mitral (valve) stenosis: Secondary | ICD-10-CM | POA: Diagnosis not present

## 2023-07-13 DIAGNOSIS — I340 Nonrheumatic mitral (valve) insufficiency: Secondary | ICD-10-CM | POA: Diagnosis not present

## 2023-07-13 LAB — CBC
HCT: 34 % — ABNORMAL LOW (ref 36.0–46.0)
Hemoglobin: 11.4 g/dL — ABNORMAL LOW (ref 12.0–15.0)
MCH: 30.6 pg (ref 26.0–34.0)
MCHC: 33.5 g/dL (ref 30.0–36.0)
MCV: 91.2 fL (ref 80.0–100.0)
Platelets: 140 10*3/uL — ABNORMAL LOW (ref 150–400)
RBC: 3.73 MIL/uL — ABNORMAL LOW (ref 3.87–5.11)
RDW: 12.7 % (ref 11.5–15.5)
WBC: 7.1 10*3/uL (ref 4.0–10.5)
nRBC: 0 % (ref 0.0–0.2)

## 2023-07-13 LAB — ECHOCARDIOGRAM COMPLETE
AR max vel: 3.12 cm2
AV Area VTI: 3.12 cm2
AV Area mean vel: 3.02 cm2
AV Mean grad: 6 mmHg
AV Peak grad: 12.1 mmHg
Ao pk vel: 1.74 m/s
Area-P 1/2: 2.99 cm2
Calc EF: 57.6 %
Height: 60 in
MV M vel: 5.62 m/s
MV Peak grad: 126.3 mmHg
MV VTI: 1.96 cm2
Radius: 0.5 cm
S' Lateral: 3.1 cm
Single Plane A2C EF: 61.5 %
Single Plane A4C EF: 52.8 %
Weight: 1809.6 [oz_av]

## 2023-07-13 LAB — BASIC METABOLIC PANEL WITH GFR
Anion gap: 6 (ref 5–15)
BUN: 12 mg/dL (ref 8–23)
CO2: 26 mmol/L (ref 22–32)
Calcium: 8.5 mg/dL — ABNORMAL LOW (ref 8.9–10.3)
Chloride: 108 mmol/L (ref 98–111)
Creatinine, Ser: 0.81 mg/dL (ref 0.44–1.00)
GFR, Estimated: >60 mL/min (ref >60)
Glucose, Bld: 93 mg/dL (ref 70–99)
Potassium: 3.8 mmol/L (ref 3.5–5.1)
Sodium: 140 mmol/L (ref 135–145)

## 2023-07-13 LAB — MAGNESIUM: Magnesium: 1.9 mg/dL (ref 1.7–2.4)

## 2023-07-13 MED ORDER — ASPIRIN 81 MG PO CHEW: 81.0000 mg | CHEWABLE_TABLET | Freq: Every day | ORAL | Status: AC

## 2023-07-13 NOTE — Plan of Care (Signed)

## 2023-07-13 NOTE — Anesthesia Postprocedure Evaluation (Signed)
 Anesthesia Post Note  Patient: Margaret [name]  Procedure(s) Performed: Transcatheter Aortic Valve Replacement, Transfemoral (Right) ECHOCARDIOGRAM, TRANSTHORACIC     Patient location during evaluation: PACU Anesthesia Type: MAC Level of consciousness: awake and alert Pain management: pain level controlled Vital Signs Assessment: post-procedure vital signs reviewed and stable Respiratory status: spontaneous breathing, nonlabored ventilation, respiratory function stable and patient connected to nasal cannula oxygen Cardiovascular status: stable and blood pressure returned to baseline Postop Assessment: no apparent nausea or vomiting Anesthetic complications: no   There were no known notable events for this encounter.  Last Vitals:  Vitals:   07/13/23 0307 07/13/23 0717  BP: (!) 158/57 (!) 157/60  Pulse: 75 76  Resp: 20 16  Temp: 36.8 C 36.6 C  SpO2: 99% 100%    Last Pain:  Vitals:   07/13/23 0830  TempSrc:   PainSc: 0-No pain                 Melvenia Stabs

## 2023-07-13 NOTE — Progress Notes (Signed)
 CARDIAC REHAB PHASE I     Post TAVR education including site care, restrictions, risk factors, heart healthy diet, exercise guidelines and CRP2 reviewed. All questions and concerns addressed. Referral for CRP2 sent to [facility]. Plan for discharge home later.   [id number]  Ronny Colas, RN BSN 07/13/2023 10:07 AM

## 2023-07-13 NOTE — Progress Notes (Signed)
 Pt being d/c, VSS, IV removed, Education Complete.   Keane Passe, RN 07/13/2023 10:58 AM

## 2023-07-13 NOTE — Discharge Instructions (Signed)

## 2023-07-13 NOTE — Progress Notes (Signed)
  Echocardiogram 2D Echocardiogram has been performed.  Margaret [name] 07/13/2023, 9:30 AM

## 2023-07-14 ENCOUNTER — Telehealth: Payer: Self-pay | Admitting: Physician Assistant

## 2023-07-14 NOTE — Telephone Encounter (Signed)
  HEART AND VASCULAR CENTER   MULTIDISCIPLINARY HEART VALVE TEAM     Patient contacted regarding discharge from [facility] on 07/13/23   Patient understands to follow up with a structural heart provider on 6/2 at [address] [facility]. Patient understands discharge instructions? yes Patient understands medications and regimen? yes Patient understands to bring all medications to this visit? yes   Abagail Hoar PA-C  [facility]

## 2023-07-21 ENCOUNTER — Telehealth (HOSPITAL_COMMUNITY): Payer: Self-pay

## 2023-07-21 NOTE — Progress Notes (Signed)
 [facility]   MULTIDISCIPLINARY HEART VALVE CLINIC                                     Cardiology Office Note:    Date:  07/25/2023   ID:  [name], DOB 1929/12/29, MRN [id number]  PCP:  Mariel Shope, DO  [facility] Cardiologist:  Sheryle Donning, MD  [facility] Structural heart: Kyra Phy, MD [facility] Electrophysiologist:  None   Referring MD: Mariel Shope, DO   TOC s/p TAVR  History of Present Illness:    Margaret [name] is a 89 y.o. 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  Echo 04/08/23 showed EF 55%, moderate asymmetric LHV, and severe AS with mean grad 40 mmHg and severe MAC with moderate MR & mild MS. L/RHC 04/26/23 showed minimal nonobstructive coronary artery disease of left dominant system. S/p successful TAVR with a 23 mm Edwards Sapien 3 Ultra Resilia THV via the TF approach on 07/12/23. Post operative echo showed EF 55%, moderate RVE, mod MAC with mild MR/MS, normally functioning TAVR with a mean gradient of 6 mmHg and no PVL. Started on a baby aspirin  81mg  daily.   Today the patient presents to clinic for follow up. Here with her daughter. No CP or SOB. No LE edema, orthopnea or PND. No dizziness or syncope. No blood in stool or urine. No palpitations. Biggest complaints are peripheral neuropathy and fatigue which she was hoping would improve with valve surgery. Both are about the same.    Past Medical History:  Diagnosis Date   Allergy    Asthma    Carotid artery stenosis 10/17/2012   stable report in 2018- "moderate on right, minimal on left"   Carpal tunnel syndrome 2018   left.   Cervical radiculopathy 05/16/2015   DDD C4-C5, C5-C6, and C6-C7.  Facet arthropathy throughout cervical spine.  Normal foraminal narrowing secondary to uncovertebral  arthropathy is seen  bilaterally at C3-C4 and C5/C6   DDD (degenerative disc disease), lumbar 04/26/2015   Mild levoscoliosis, right anterior listhesis of L4 and L5.  DDD at every level.  Facet arthropathy at multiple levels.   Glaucoma    both eyes, mild opened angle   History of colon polyps    History of frequent urinary tract infections    klebs- pansensitive. c. 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 Deloise Ferries and Dr Lorie Rook   Syncope 05/17/2017   Thyroid  disease    Urinary incontinence    Vertigo    had been prescribed antivert   Vitamin D   deficiency      Current Medications: Current Meds  Medication Sig   acetaminophen  (TYLENOL ) 500 MG tablet Take 500 mg by mouth 2 (two) times daily.   albuterol  (VENTOLIN  HFA) 108 (90 Base) MCG/ACT inhaler Inhale 2 puffs into the lungs every 6 (six) hours as needed for wheezing or shortness of breath.   aspirin  81 MG chewable tablet Chew 1 tablet (81 mg total) by mouth daily.   azithromycin (ZITHROMAX) 500 MG tablet Take 1 tablet (500 mg total) by mouth as directed. Take one tablet 1 hour before any dental work including cleanings.   Calcium Carb-Cholecalciferol (CALCIUM 600 + D PO) Take 1 tablet by mouth daily.   cycloSPORINE  (RESTASIS ) 0.05 % ophthalmic emulsion Place 1 drop into both eyes 2 (two) times daily.   diclofenac Sodium (VOLTAREN) 1 % GEL Apply 1 Application topically 2 (two) times daily.   fluocinonide   ointment (LIDEX ) 0.05 % Apply 1 Application topically 2 (two) times daily.   leflunomide  (ARAVA ) 10 MG tablet Take 10 mg by mouth daily.   levocetirizine (XYZAL) 5 MG tablet Take 5 mg by mouth daily as needed for allergies.   levothyroxine  (SYNTHROID ) 50 MCG tablet Take 1 tablet (50 mcg total) by mouth daily before breakfast.   montelukast  (SINGULAIR ) 10 MG tablet Take 1 tablet (10 mg total) by mouth at bedtime.   Multiple Vitamins-Minerals (PRESERVISION AREDS 2) CAPS Take 1 capsule by mouth every evening.   Multiple Vitamins-Minerals (PRESERVISION AREDS 2+MULTI VIT) CAPS Take 1 tablet by mouth daily.   predniSONE  (DELTASONE ) 5 MG tablet Take 5 mg by mouth daily as needed (arthritis).   pregabalin  (LYRICA ) 25 MG capsule Take 1 capsule (25 mg total) by mouth at bedtime.   Probiotic Product (PROBIOTIC PO) Take 1 capsule by mouth daily.   Propylene Glycol, PF, (SYSTANE COMPLETE PF) 0.6 % SOLN Place 1 drop into both eyes 2 (two) times daily as needed (dry eyes).      ROS:   Please see the history of present illness.    All other systems reviewed and are negative.  EKGs   EKG Interpretation Date/Time:  Monday July 25 2023 10:24:41 EDT Ventricular Rate:  70 PR Interval:  184 QRS Duration:  90 QT Interval:  420 QTC Calculation: 453 R Axis:   -13  Text Interpretation: Sinus rhythm with Premature ventricular complexes or Fusion complexes with ventricular escape complexes Anterior infarct , age undetermined When compared with ECG of 13-Jul-2023 03:11, Fusion complexes are now Present Premature ventricular complexes are now Present Anterior infarct is now Present Confirmed by Abagail Hoar [phone]) on 07/25/2023 10:39:21 AM   Risk Assessment/Calculations:           Physical Exam:    VS:  BP (!) 166/68 (BP Location: Right Arm, Patient Position: Sitting, Cuff Size: Normal)   Pulse 70   Ht 5' (1.524 m)   Wt 116 lb 6.4 oz (52.8 kg)   SpO2 98%   BMI 22.73 kg/m     Wt Readings from  Last 3 Encounters:  07/25/23 116 lb 6.4 oz (52.8 kg)  07/13/23 113 lb 1.6 oz (51.3 kg)  06/24/23 117 lb 3.2 oz (53.2 kg)     GEN: Well nourished, well developed in no acute distress NECK: No JVD CARDIAC: RRR, soft murmur over LUSB. No rubs, gallops RESPIRATORY:  Clear to auscultation without rales, wheezing or rhonchi  ABDOMEN: Soft, non-tender, non-distended EXTREMITIES:  No edema; No deformity.  Groin sites clear without hematoma or ecchymosis.  ASSESSMENT:    1. S/P TAVR (transcatheter aortic valve replacement)     PLAN:    In order of problems listed above:  Severe AS s/p TAVR:  -- Pt doing okay s/p TAVR.  -- ECG with new LBBB but no HAVB.  -- Groin sites healing well.  -- SBE prophylaxis discussed; I have RX'd azithromycin due to a PCN allergy.  -- Continue Aspirin  81mg  daily. -- Cleared to resume all activities without restriction. -- Encouraged cardiac rehab to build up stamina. -- I will see back for 1 month echo and OV.  HTN:  -- Initially elevated to 166/68 -- BP 140/70 on my personal recheck. -- Reviewed log of BPs at home which were all in normal range.  -- Suspect component of white coat HTN.  New LBBB: -- Noted on ECG today.      Cardiac Rehabilitation Eligibility Assessment  The patient is ready to start cardiac rehabilitation from a cardiac standpoint.      Medication Adjustments/Labs and Tests Ordered: Current medicines are reviewed at length with the patient today.  Concerns regarding medicines are outlined above.  Orders Placed This Encounter  Procedures   EKG 12-Lead   Meds ordered this encounter  Medications   azithromycin (ZITHROMAX) 500 MG tablet    Sig: Take 1 tablet (500 mg total) by mouth as directed. Take one tablet 1 hour before any dental work including cleanings.    Dispense:  6 tablet    Refill:  12    Supervising Provider:   Arnoldo Lapping [[id number]]    Patient Instructions  Medication Instructions:  Your azithromycin  has been called in to your pharmacy. *If you need a refill on your cardiac medications before your next appointment, please call your pharmacy*   Lab Work: No labs were ordered during today's visit.  If you have labs (blood work) drawn today and your tests are completely normal, you will receive your results only by: MyChart Message (if you have MyChart) OR A paper copy in the mail If you have any lab test that is abnormal or we need to change your treatment, we will call you to review the results.   Testing/Procedures: No procedures were ordered during today's visit.    Follow-Up: At [facility], you and your health needs are our priority.  As part of our continuing mission to provide you with exceptional heart care, we have created designated Provider Care Teams.  These Care Teams include your primary Cardiologist (physician) and Advanced Practice Providers (APPs -  Physician Assistants and Nurse Practitioners) who all work together to provide you with the care you need, when you need it.  We recommend signing up for the patient portal called "MyChart".  Sign up information is provided on this After Visit Summary.  MyChart is used to connect with patients for Virtual Visits (Telemedicine).  Patients are able to view lab/test results, encounter notes, upcoming appointments, etc.  Non-urgent messages can be sent to your provider as well.   To learn more about what you can do with MyChart, go to [email or web address].    Your next appointment:   Keep scheduled appointment    Other Instructions Thank you for choosing [facility]!       Signed, Abagail Hoar, PA-C  07/25/2023 11:01 AM    [facility]

## 2023-07-21 NOTE — Telephone Encounter (Signed)
 Attempted to call patient in regards to Cardiac Rehab - LM on VM

## 2023-07-21 NOTE — Telephone Encounter (Signed)
 Pt insurance is active and benefits verified through [facility]. Co-pay $0.00, DED $0.00/$0.00 met, out of pocket $1,500.00/$413.22 met, co-insurance 4%. No pre-authorization required. Passport, 07/21/23 @ 11:16AM, REF#[id number]   How many CR sessions are covered? (36 visits for TCR, 72 visits for ICR)72 Is this a lifetime maximum or an annual maximum? Annual Has the member used any of these services to date? No Is there a time limit (weeks/months) on start of program and/or program completion? No     Will contact patient to see if she is interested in the Cardiac Rehab Program. If interested, patient will need to complete follow up appt. Once completed, patient will be contacted for scheduling upon review by the RN Navigator.

## 2023-07-25 ENCOUNTER — Ambulatory Visit: Attending: Physician Assistant | Admitting: Physician Assistant

## 2023-07-25 ENCOUNTER — Encounter: Payer: Self-pay | Admitting: Physician Assistant

## 2023-07-25 VITALS — BP 166/68 | HR 70 | Ht 60.0 in | Wt 116.4 lb

## 2023-07-25 DIAGNOSIS — Z952 Presence of prosthetic heart valve: Secondary | ICD-10-CM | POA: Diagnosis not present

## 2023-07-25 MED ORDER — AZITHROMYCIN 500 MG PO TABS: 500.0000 mg | ORAL_TABLET | ORAL | 12 refills | Status: DC

## 2023-07-25 NOTE — Patient Instructions (Signed)
 Medication Instructions:  Your azithromycin has been called in to your pharmacy. *If you need a refill on your cardiac medications before your next appointment, please call your pharmacy*   Lab Work: No labs were ordered during today's visit.  If you have labs (blood work) drawn today and your tests are completely normal, you will receive your results only by: MyChart Message (if you have MyChart) OR A paper copy in the mail If you have any lab test that is abnormal or we need to change your treatment, we will call you to review the results.   Testing/Procedures: No procedures were ordered during today's visit.    Follow-Up: At [facility], you and your health needs are our priority.  As part of our continuing mission to provide you with exceptional heart care, we have created designated Provider Care Teams.  These Care Teams include your primary Cardiologist (physician) and Advanced Practice Providers (APPs -  Physician Assistants and Nurse Practitioners) who all work together to provide you with the care you need, when you need it.  We recommend signing up for the patient portal called "MyChart".  Sign up information is provided on this After Visit Summary.  MyChart is used to connect with patients for Virtual Visits (Telemedicine).  Patients are able to view lab/test results, encounter notes, upcoming appointments, etc.  Non-urgent messages can be sent to your provider as well.   To learn more about what you can do with MyChart, go to [email or web address].    Your next appointment:   Keep scheduled appointment    Other Instructions Thank you for choosing [facility]!

## 2023-07-27 ENCOUNTER — Telehealth (HOSPITAL_COMMUNITY): Payer: Self-pay

## 2023-07-27 ENCOUNTER — Encounter (HOSPITAL_COMMUNITY): Payer: Self-pay

## 2023-07-27 NOTE — Telephone Encounter (Signed)
Attempted to call patient in regards to Cardiac Rehab - LM on VM   Sent letter 

## 2023-07-27 NOTE — Telephone Encounter (Signed)
 Pt daughter Concha Deed called back and stated that pt is interested in scheduling for cardiac rehab. Pt will come in for orientation on 6/12@1030  and will attend the 1145 class time.   Sent letter

## 2023-08-01 ENCOUNTER — Telehealth (HOSPITAL_COMMUNITY): Payer: Self-pay

## 2023-08-01 NOTE — Telephone Encounter (Signed)
 Patient called stating they do not have transportation to their 1st CR class on 6/16, will start class on Wednesday 6/18 instead.

## 2023-08-03 ENCOUNTER — Telehealth (HOSPITAL_COMMUNITY): Payer: Self-pay | Admitting: *Deleted

## 2023-08-03 NOTE — Telephone Encounter (Signed)
 Spoke with Ms Wixted. Confirmed appointment for cardiac rehab orientation on 08/04/23.Monte Antonio RN BSN

## 2023-08-04 ENCOUNTER — Other Ambulatory Visit (HOSPITAL_COMMUNITY): Payer: Self-pay | Admitting: *Deleted

## 2023-08-04 ENCOUNTER — Telehealth (HOSPITAL_COMMUNITY): Payer: Self-pay

## 2023-08-04 ENCOUNTER — Other Ambulatory Visit: Payer: Self-pay

## 2023-08-04 ENCOUNTER — Telehealth (HOSPITAL_BASED_OUTPATIENT_CLINIC_OR_DEPARTMENT_OTHER): Payer: Self-pay | Admitting: Nurse Practitioner

## 2023-08-04 ENCOUNTER — Encounter (HOSPITAL_COMMUNITY)
Admission: RE | Admit: 2023-08-04 | Discharge: 2023-08-04 | Disposition: A | Discharge status: Home / Self Care | Source: Ambulatory Visit | Attending: Cardiology | Admitting: Cardiology

## 2023-08-04 VITALS — BP 138/62 | HR 70 | Ht 60.5 in | Wt 117.7 lb

## 2023-08-04 DIAGNOSIS — Z952 Presence of prosthetic heart valve: Secondary | ICD-10-CM | POA: Insufficient documentation

## 2023-08-04 DIAGNOSIS — I499 Cardiac arrhythmia, unspecified: Secondary | ICD-10-CM

## 2023-08-04 NOTE — Telephone Encounter (Signed)
 Margaret [name],  Wanted to make you aware that while in cardiac rehab today, she had irregular HR. Initial rhythm strips showed PVCs but also portions concerning for no p wave. Nurse got 12 lead EKG which should be visible to you. It showed sinus brady with frequent PVCs. She is asymptomatic, I just wanted to make you aware for her visit with you on 6/30.  Thanks! Moira Andrews

## 2023-08-04 NOTE — Progress Notes (Signed)
 Pt in today for Cardiac rehab orientation s/p TAVR 07/12/23.  Placed on telemetry and noted irregularity that differed from most recent 12 lead EKG.  Difficult to determine if she had p waves. Pt aware of palpitations when she lays down in the bed.  However voices no other complaints. Reviewed strips with Slater Duncan NP, supervising provider for today.  12 lead EKG ordered with plans to message Kathyrn Thompson PA in the structural heart clinic. 12 lead EKG completed and viewed by Slater Duncan NP.  No further interventions warranted.  Allye has an upcoming follow up and echo in the structural heart clinic on 6/30.Lettie Ray, BSN Cardiac and Emergency planning/management officer

## 2023-08-04 NOTE — Progress Notes (Signed)
 Cardiac Rehab Medication Review  Does the patient  feel that his/her medications are working for him/her?  yes  Has the patient been experiencing any side effects to the medications prescribed?  yes  Does the patient measure his/her own blood pressure or blood glucose at home?  no   Does the patient have any problems obtaining medications due to transportation or finances?   no  Understanding of regimen: excellent Understanding of indications: excellent Potential of compliance: excellent        Faylinn Schwenn Thurman Flores 08/04/2023 11:01 AM

## 2023-08-04 NOTE — Progress Notes (Signed)
 Cardiac Individual Treatment Plan  Patient Details  Name: Margaret [name] MRN: [id number] Date of Birth: 1929-06-23 Referring Provider:   Flowsheet Row INTENSIVE CARDIAC REHAB ORIENT from 08/04/2023 in [facility] V[facility], & Lung [facility]  Referring Provider Sheryle Donning, MD    Initial Encounter Date:  Flowsheet Row INTENSIVE CARDIAC REHAB ORIENT from 08/04/2023 in [facility] for Heart, Vascular, & Lung Health  Date 08/04/23    Visit Diagnosis: 07/12/23 S/P TAVR (transcatheter aortic valve replacement)  Patient's Home Medications on Admission:  Current Outpatient Medications:    acetaminophen  (TYLENOL ) 500 MG tablet, Take 500 mg by mouth 2 (two) times daily., Disp: , Rfl:    albuterol  (VENTOLIN  HFA) 108 (90 Base) MCG/ACT inhaler, Inhale 2 puffs into the lungs every 6 (six) hours as needed for wheezing or shortness of breath., Disp: 1 each, Rfl: 0   aspirin  81 MG chewable tablet, Chew 1 tablet (81 mg total) by mouth daily., Disp: , Rfl:    azithromycin  (ZITHROMAX ) 500 MG tablet, Take 1 tablet (500 mg total) by mouth as directed. Take one tablet 1 hour before any dental work including cleanings., Disp: 6 tablet, Rfl: 12   Calcium Carb-Cholecalciferol (CALCIUM 600 + D PO), Take 1 tablet by mouth daily., Disp: , Rfl:    cycloSPORINE  (RESTASIS ) 0.05 % ophthalmic emulsion, Place 1 drop into both eyes 2 (two) times daily., Disp: , Rfl:    diclofenac Sodium (VOLTAREN) 1 % GEL, Apply 1 Application topically 2 (two) times daily., Disp: , Rfl:    fluocinonide  ointment (LIDEX ) 0.05 %, Apply 1 Application topically 2 (two) times daily., Disp: , Rfl:    leflunomide  (ARAVA ) 10 MG tablet, Take 10 mg by mouth daily., Disp: , Rfl:    levocetirizine (XYZAL) 5 MG tablet, Take 5 mg by mouth daily as needed for allergies., Disp: , Rfl:    levothyroxine  (SYNTHROID ) 50 MCG tablet, Take 1 tablet (50 mcg total) by mouth daily before breakfast., Disp:  90 tablet, Rfl: 3   montelukast  (SINGULAIR ) 10 MG tablet, Take 1 tablet (10 mg total) by mouth at bedtime., Disp: 90 tablet, Rfl: 1   Multiple Vitamins-Minerals (PRESERVISION AREDS 2) CAPS, Take 1 capsule by mouth every evening., Disp: , Rfl:    Multiple Vitamins-Minerals (PRESERVISION AREDS 2+MULTI VIT) CAPS, Take 1 tablet by mouth daily., Disp: , Rfl:    predniSONE  (DELTASONE ) 5 MG tablet, Take 5 mg by mouth daily as needed (arthritis)., Disp: , Rfl:    pregabalin  (LYRICA ) 25 MG capsule, Take 1 capsule (25 mg total) by mouth at bedtime., Disp: 90 capsule, Rfl: 1   Probiotic Product (PROBIOTIC PO), Take 1 capsule by mouth daily., Disp: , Rfl:    Propylene Glycol, PF, (SYSTANE COMPLETE PF) 0.6 % SOLN, Place 1 drop into both eyes 2 (two) times daily as needed (dry eyes)., Disp: , Rfl:   Past Medical History: Past Medical History:  Diagnosis Date   Allergy    Asthma    Carotid artery stenosis 10/17/2012   stable report in 2018- moderate on right, minimal on left   Carpal tunnel syndrome 2018   left.   Cervical radiculopathy 05/16/2015   DDD C4-C5, C5-C6, and C6-C7.  Facet arthropathy throughout cervical spine.  Normal foraminal narrowing secondary to uncovertebral  arthropathy is seen bilaterally at C3-C4 and C5/C6   DDD (degenerative disc disease), lumbar 04/26/2015   Mild levoscoliosis, right anterior listhesis of L4 and L5.  DDD at every level.  Facet  arthropathy at multiple levels.   Glaucoma    both eyes, mild opened angle   History of colon polyps    History of frequent urinary tract infections    klebs- pansensitive. c. 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 Deloise Ferries and Dr Lorie Rook   Syncope 05/17/2017   Thyroid  disease    Urinary incontinence    Vertigo    had been prescribed antivert   Vitamin D  deficiency     Tobacco Use: Social History   Tobacco Use  Smoking Status Former   Current packs/day: 0.00   Average packs/day: 0.5 packs/day for 13.0 years (6.5 ttl pk-yrs)   Types: Cigarettes   Start date: 16   Quit date: 1965   Years since quitting: 60.4  Smokeless Tobacco Never    Labs: Review Flowsheet  More data exists      Latest Ref Rng & Units 08/08/2017 09/05/2020 06/16/2022 04/26/2023 07/12/2023  Labs for ITP Cardiac and Pulmonary Rehab  Direct LDL mg/dL - 960  454.0  - -  Hemoglobin A1c - 4.9     - - - -  PH, Arterial 7.35 - 7.45 - - - 7.367  -  PCO2 arterial 32 - 48 mmHg - - - 41.7  -  Bicarbonate 20.0 - 28.0 mmol/L - - - 24.2  24.3  23.9  -  TCO2 22 - 32 mmol/L - - - 26  26  25  23    Acid-base deficit 0.0 - 2.0 mmol/L - - - 2.0  2.0  1.0  -  O2 Saturation % - - - 66  70  95  -    Details       This result is from an external source.   Multiple values from one day are sorted in reverse-chronological order         Capillary Blood Glucose: Lab Results  Component Value Date   GLUCAP 132 (H) 08/03/2022   GLUCAP 93 08/02/2022   GLUCAP 91 08/01/2022   GLUCAP 93 07/31/2022   GLUCAP 98 07/30/2022     Exercise Target Goals: Exercise Program Goal: Individual exercise prescription set using results from initial 6 min walk test and THRR while considering  patient's activity barriers and safety.   Exercise Prescription Goal: Initial exercise prescription builds to 30-45 minutes a day of aerobic activity, 2-3 days per week.  Home exercise guidelines will be given to patient during program  as part of exercise prescription that the participant will acknowledge.  Activity Barriers & Risk Stratification:  Activity Barriers & Cardiac Risk Stratification - 08/04/23 1457       Activity Barriers & Cardiac Risk Stratification   Activity Barriers Balance Concerns;Arthritis;Joint Problems;Back Problems;Deconditioning;Muscular Weakness;Neck/Spine Problems    Cardiac Risk Stratification High  6 Minute Walk:  6 Minute Walk     Row Name 08/04/23 1248         6 Minute Walk   Phase Initial     Distance 1046 feet     Walk Time 6 minutes     # of Rest Breaks 0     MPH 2     METS 1.33     RPE 13     Perceived Dyspnea  0     VO2 Peak 4.65     Symptoms No     Resting HR 70 bpm     Resting BP 138/62     Resting Oxygen Saturation  99 %     Exercise Oxygen Saturation  during 6 min walk 98 %     Max Ex. HR 92 bpm     Max Ex. BP 130/84     2 Minute Post BP 130/80        Oxygen Initial Assessment:   Oxygen Re-Evaluation:   Oxygen Discharge (Final Oxygen Re-Evaluation):   Initial Exercise Prescription:  Initial Exercise Prescription - 08/04/23 1400       Date of Initial Exercise RX and Referring Provider   Date 08/04/23    Referring Provider Sheryle Donning, MD    Expected Discharge Date 10/26/23      NuStep   Level 1    SPM 75    Minutes 25    METs 1.3      Prescription Details   Frequency (times per week) 3    Duration Progress to 30 minutes of continuous aerobic without signs/symptoms of physical distress      Intensity   THRR 40-80% of Max Heartrate 52-103    Ratings of Perceived Exertion 11-13    Perceived Dyspnea 0-4      Progression   Progression Continue progressive overload as per policy without signs/symptoms or physical distress.      Resistance Training   Training Prescription Yes    Weight 1 lbs    Reps 10-15          Perform Capillary Blood Glucose checks as needed.  Exercise Prescription Changes:   Exercise  Comments:   Exercise Goals and Review:   Exercise Goals     Row Name 08/04/23 1040             Exercise Goals   Increase Physical Activity Yes       Intervention Provide advice, education, support and counseling about physical activity/exercise needs.;Develop an individualized exercise prescription for aerobic and resistive training based on initial evaluation findings, risk stratification, comorbidities and participant's personal goals.       Expected Outcomes Long Term: Add in home exercise to make exercise part of routine and to increase amount of physical activity.;Short Term: Attend rehab on a regular basis to increase amount of physical activity.;Long Term: Exercising regularly at least 3-5 days a week.       Increase Strength and Stamina Yes       Intervention Provide advice, education, support and counseling about physical activity/exercise needs.;Develop an individualized exercise prescription for aerobic and resistive training based on initial evaluation findings, risk stratification, comorbidities and participant's personal goals.       Expected Outcomes Short Term: Increase workloads from initial exercise prescription for resistance, speed, and METs.;Short Term: Perform resistance training exercises routinely during rehab and add in resistance training at home;Long Term: Improve cardiorespiratory fitness, muscular endurance and strength as measured by increased METs and functional  capacity ( )       Able to understand and use rate of perceived exertion (RPE) scale Yes       Intervention Provide education and explanation on how to use RPE scale       Expected Outcomes Short Term: Able to use RPE daily in rehab to express subjective intensity level;Long Term:  Able to use RPE to guide intensity level when exercising independently       Knowledge and understanding of Target Heart Rate Range (THRR) Yes       Intervention Provide education and explanation of THRR including how the  numbers were predicted and where they are located for reference       Expected Outcomes Short Term: Able to state/look up THRR;Long Term: Able to use THRR to govern intensity when exercising independently;Short Term: Able to use daily as guideline for intensity in rehab       Understanding of Exercise Prescription Yes       Intervention Provide education, explanation, and written materials on patient's individual exercise prescription       Expected Outcomes Short Term: Able to explain program exercise prescription;Long Term: Able to explain home exercise prescription to exercise independently          Exercise Goals Re-Evaluation :   Discharge Exercise Prescription (Final Exercise Prescription Changes):   Nutrition:  Target Goals: Understanding of nutrition guidelines, daily intake of sodium 1500mg , cholesterol 200mg , calories 30% from fat and 7% or less from saturated fats, daily to have 5 or more servings of fruits and vegetables.  Biometrics:  Pre Biometrics - 08/04/23 1033       Pre Biometrics   Waist Circumference 34 inches    Hip Circumference 37.25 inches    Waist to Hip Ratio 0.91 %    Triceps Skinfold 11 mm    % Body Fat 33.4 %    Grip Strength 8 kg    Flexibility --   Not perfomed back issues   Single Leg Stand --   not performed          Nutrition Therapy Plan and Nutrition Goals:   Nutrition Assessments:  MEDIFICTS Score Key: >=70 Need to make dietary changes  40-70 Heart Healthy Diet <= 40 Therapeutic Level Cholesterol Diet    Picture Your Plate Scores: <16 Unhealthy dietary pattern with much room for improvement. 41-50 Dietary pattern unlikely to meet recommendations for good health and room for improvement. 51-60 More healthful dietary pattern, with some room for improvement.  >60 Healthy dietary pattern, although there may be some specific behaviors that could be improved.    Nutrition Goals Re-Evaluation:   Nutrition Goals  Re-Evaluation:   Nutrition Goals Discharge (Final Nutrition Goals Re-Evaluation):   Psychosocial: Target Goals: Acknowledge presence or absence of significant depression and/or stress, maximize coping skills, provide positive support system. Participant is able to verbalize types and ability to use techniques and skills needed for reducing stress and depression.  Initial Review & Psychosocial Screening:  Initial Psych Review & Screening - 08/04/23 1504       Initial Review   Current issues with Current Stress Concerns    Comments general everyday stress      Family Dynamics   Good Support System? Yes   Pt has daughter     Barriers   Psychosocial barriers to participate in program The patient should benefit from training in stress management and relaxation.      Screening Interventions   Interventions Encouraged to exercise  Quality of Life Scores:  Quality of Life - 08/04/23 1504       Quality of Life   Select Quality of Life      Quality of Life Scores   Health/Function Pre 26.46 %    Socioeconomic Pre 27.75 %    Psych/Spiritual Pre 21.75 %    Family Pre 30 %    GLOBAL Pre 26.23 %         Scores of 19 and below usually indicate a poorer quality of life in these areas.  A difference of  2-3 points is a clinically meaningful difference.  A difference of 2-3 points in the total score of the Quality of Life Index has been associated with significant improvement in overall quality of life, self-image, physical symptoms, and general health in studies assessing change in quality of life.  PHQ-9: Review Flowsheet  More data exists      08/04/2023 05/04/2023 01/12/2023 12/16/2022 07/23/2022  Depression screen PHQ 2/9  Decreased Interest 0 0 0 0 0 0  Down, Depressed, Hopeless 0 0 0 0 0 0  PHQ - 2 Score 0 0 0 0 0 0  Altered sleeping 0 0 1 1 - 1  Tired, decreased energy 1 1 1 1  - 1  Change in appetite 0 0 1 0 - 0  Feeling bad or failure about yourself  0 0 0 0 -  0  Trouble concentrating 1 1 0 0 - 1  Moving slowly or fidgety/restless 1 1 0 0 - 0  Suicidal thoughts 0 0 0 0 - 0  PHQ-9 Score 3 3 3 2  - 3  Difficult doing work/chores Somewhat difficult Somewhat difficult Not difficult at all Not difficult at all - Not difficult at all    Details       Multiple values from one day are sorted in reverse-chronological order        Interpretation of Total Score  Total Score Depression Severity:  1-4 = Minimal depression, 5-9 = Mild depression, 10-14 = Moderate depression, 15-19 = Moderately severe depression, 20-27 = Severe depression   Psychosocial Evaluation and Intervention:   Psychosocial Re-Evaluation:   Psychosocial Discharge (Final Psychosocial Re-Evaluation):   Vocational Rehabilitation: Provide vocational rehab assistance to qualifying candidates.   Vocational Rehab Evaluation & Intervention:  Vocational Rehab - 08/04/23 1502       Initial Vocational Rehab Evaluation & Intervention   Assessment shows need for Vocational Rehabilitation No   Pt is retired         Education: Education Goals: Education classes will be provided on a weekly basis, covering required topics. Participant will state understanding/return demonstration of topics presented.     Core Videos: Exercise    Move It!  Clinical staff conducted group or individual video education with verbal and written material and guidebook.  Patient learns the recommended Pritikin exercise program. Exercise with the goal of living a long, healthy life. Some of the health benefits of exercise include controlled diabetes, healthier blood pressure levels, improved cholesterol levels, improved heart and lung capacity, improved sleep, and better body composition. Everyone should speak with their doctor before starting or changing an exercise routine.  Biomechanical Limitations Clinical staff conducted group or individual video education with verbal and written material and  guidebook.  Patient learns how biomechanical limitations can impact exercise and how we can mitigate and possibly overcome limitations to have an impactful and balanced exercise routine.  Body Composition Clinical staff conducted group or individual video education  with verbal and written material and guidebook.  Patient learns that body composition (ratio of muscle mass to fat mass) is a key component to assessing overall fitness, rather than body weight alone. Increased fat mass, especially visceral belly fat, can put us  at increased risk for metabolic syndrome, type 2 diabetes, heart disease, and even death. It is recommended to combine diet and exercise (cardiovascular and resistance training) to improve your body composition. Seek guidance from your physician and exercise physiologist before implementing an exercise routine.  Exercise Action Plan Clinical staff conducted group or individual video education with verbal and written material and guidebook.  Patient learns the recommended strategies to achieve and enjoy long-term exercise adherence, including variety, self-motivation, self-efficacy, and positive decision making. Benefits of exercise include fitness, good health, weight management, more energy, better sleep, less stress, and overall well-being.  Medical   Heart Disease Risk Reduction Clinical staff conducted group or individual video education with verbal and written material and guidebook.  Patient learns our heart is our most vital organ as it circulates oxygen, nutrients, white blood cells, and hormones throughout the entire body, and carries waste away. Data supports a plant-based eating plan like the Pritikin Program for its effectiveness in slowing progression of and reversing heart disease. The video provides a number of recommendations to address heart disease.   Metabolic Syndrome and Belly Fat  Clinical staff conducted group or individual video education with verbal and  written material and guidebook.  Patient learns what metabolic syndrome is, how it leads to heart disease, and how one can reverse it and keep it from coming back. You have metabolic syndrome if you have 3 of the following 5 criteria: abdominal obesity, high blood pressure, high triglycerides, low HDL cholesterol, and high blood sugar.  Hypertension and Heart Disease Clinical staff conducted group or individual video education with verbal and written material and guidebook.  Patient learns that high blood pressure, or hypertension, is very common in the [place] . Hypertension is largely due to excessive salt intake, but other important risk factors include being overweight, physical inactivity, drinking too much alcohol , smoking, and not eating enough potassium from fruits and vegetables. High blood pressure is a leading risk factor for heart attack, stroke, congestive heart failure, dementia, kidney failure, and premature death. Long-term effects of excessive salt intake include stiffening of the arteries and thickening of heart muscle and organ damage. Recommendations include ways to reduce hypertension and the risk of heart disease.  Diseases of Our Time - Focusing on Diabetes Clinical staff conducted group or individual video education with verbal and written material and guidebook.  Patient learns why the best way to stop diseases of our time is prevention, through food and other lifestyle changes. Medicine (such as prescription pills and surgeries) is often only a Band-Aid on the problem, not a long-term solution. Most common diseases of our time include obesity, type 2 diabetes, hypertension, heart disease, and cancer. The Pritikin Program is recommended and has been proven to help reduce, reverse, and/or prevent the damaging effects of metabolic syndrome.  Nutrition   Overview of the Pritikin Eating Plan  Clinical staff conducted group or individual video education with verbal and written  material and guidebook.  Patient learns about the Pritikin Eating Plan for disease risk reduction. The Pritikin Eating Plan emphasizes a wide variety of unrefined, minimally-processed carbohydrates, like fruits, vegetables, whole grains, and legumes. Go, Caution, and Stop food choices are explained. Plant-based and lean animal proteins are emphasized. Rationale provided  for low sodium intake for blood pressure control, low added sugars for blood sugar stabilization, and low added fats and oils for coronary artery disease risk reduction and weight management.  Calorie Density  Clinical staff conducted group or individual video education with verbal and written material and guidebook.  Patient learns about calorie density and how it impacts the Pritikin Eating Plan. Knowing the characteristics of the food you choose will help you decide whether those foods will lead to weight gain or weight loss, and whether you want to consume more or less of them. Weight loss is usually a side effect of the Pritikin Eating Plan because of its focus on low calorie-dense foods.  Label Reading  Clinical staff conducted group or individual video education with verbal and written material and guidebook.  Patient learns about the Pritikin recommended label reading guidelines and corresponding recommendations regarding calorie density, added sugars, sodium content, and whole grains.  Dining Out - Part 1  Clinical staff conducted group or individual video education with verbal and written material and guidebook.  Patient learns that restaurant meals can be sabotaging because they can be so high in calories, fat, sodium, and/or sugar. Patient learns recommended strategies on how to positively address this and avoid unhealthy pitfalls.  Facts on Fats  Clinical staff conducted group or individual video education with verbal and written material and guidebook.  Patient learns that lifestyle modifications can be just as  effective, if not more so, as many medications for lowering your risk of heart disease. A Pritikin lifestyle can help to reduce your risk of inflammation and atherosclerosis (cholesterol build-up, or plaque, in the artery walls). Lifestyle interventions such as dietary choices and physical activity address the cause of atherosclerosis. A review of the types of fats and their impact on blood cholesterol levels, along with dietary recommendations to reduce fat intake is also included.  Nutrition Action Plan  Clinical staff conducted group or individual video education with verbal and written material and guidebook.  Patient learns how to incorporate Pritikin recommendations into their lifestyle. Recommendations include planning and keeping personal health goals in mind as an important part of their success.  Healthy Mind-Set    Healthy Minds, Bodies, Hearts  Clinical staff conducted group or individual video education with verbal and written material and guidebook.  Patient learns how to identify when they are stressed. Video will discuss the impact of that stress, as well as the many benefits of stress management. Patient will also be introduced to stress management techniques. The way we think, act, and feel has an impact on our hearts.  How Our Thoughts Can Heal Our Hearts  Clinical staff conducted group or individual video education with verbal and written material and guidebook.  Patient learns that negative thoughts can cause depression and anxiety. This can result in negative lifestyle behavior and serious health problems. Cognitive behavioral therapy is an effective method to help control our thoughts in order to change and improve our emotional outlook.  Additional Videos:  Exercise    Improving Performance  Clinical staff conducted group or individual video education with verbal and written material and guidebook.  Patient learns to use a non-linear approach by alternating intensity  levels and lengths of time spent exercising to help burn more calories and lose more body fat. Cardiovascular exercise helps improve heart health, metabolism, hormonal balance, blood sugar control, and recovery from fatigue. Resistance training improves strength, endurance, balance, coordination, reaction time, metabolism, and muscle mass. Flexibility exercise improves circulation,  posture, and balance. Seek guidance from your physician and exercise physiologist before implementing an exercise routine and learn your capabilities and proper form for all exercise.  Introduction to Yoga  Clinical staff conducted group or individual video education with verbal and written material and guidebook.  Patient learns about yoga, a discipline of the coming together of mind, breath, and body. The benefits of yoga include improved flexibility, improved range of motion, better posture and core strength, increased lung function, weight loss, and positive self-image. Yoga's heart health benefits include lowered blood pressure, healthier heart rate, decreased cholesterol and triglyceride levels, improved immune function, and reduced stress. Seek guidance from your physician and exercise physiologist before implementing an exercise routine and learn your capabilities and proper form for all exercise.  Medical   Aging: Enhancing Your Quality of Life  Clinical staff conducted group or individual video education with verbal and written material and guidebook.  Patient learns key strategies and recommendations to stay in good physical health and enhance quality of life, such as prevention strategies, having an advocate, securing a Health Care Proxy and Power of Attorney, and keeping a list of medications and system for tracking them. It also discusses how to avoid risk for bone loss.  Biology of Weight Control  Clinical staff conducted group or individual video education with verbal and written material and guidebook.   Patient learns that weight gain occurs because we consume more calories than we burn (eating more, moving less). Even if your body weight is normal, you may have higher ratios of fat compared to muscle mass. Too much body fat puts you at increased risk for cardiovascular disease, heart attack, stroke, type 2 diabetes, and obesity-related cancers. In addition to exercise, following the Pritikin Eating Plan can help reduce your risk.  Decoding Lab Results  Clinical staff conducted group or individual video education with verbal and written material and guidebook.  Patient learns that lab test reflects one measurement whose values change over time and are influenced by many factors, including medication, stress, sleep, exercise, food, hydration, pre-existing medical conditions, and more. It is recommended to use the knowledge from this video to become more involved with your lab results and evaluate your numbers to speak with your doctor.   Diseases of Our Time - Overview  Clinical staff conducted group or individual video education with verbal and written material and guidebook.  Patient learns that according to the CDC, 50% to 70% of chronic diseases (such as obesity, type 2 diabetes, elevated lipids, hypertension, and heart disease) are avoidable through lifestyle improvements including healthier food choices, listening to satiety cues, and increased physical activity.  Sleep Disorders Clinical staff conducted group or individual video education with verbal and written material and guidebook.  Patient learns how good quality and duration of sleep are important to overall health and well-being. Patient also learns about sleep disorders and how they impact health along with recommendations to address them, including discussing with a physician.  Nutrition  Dining Out - Part 2 Clinical staff conducted group or individual video education with verbal and written material and guidebook.  Patient learns  how to plan ahead and communicate in order to maximize their dining experience in a healthy and nutritious manner. Included are recommended food choices based on the type of restaurant the patient is visiting.   Fueling a Banker conducted group or individual video education with verbal and written material and guidebook.  There is a strong connection between our  food choices and our health. Diseases like obesity and type 2 diabetes are very prevalent and are in large-part due to lifestyle choices. The Pritikin Eating Plan provides plenty of food and hunger-curbing satisfaction. It is easy to follow, affordable, and helps reduce health risks.  Menu Workshop  Clinical staff conducted group or individual video education with verbal and written material and guidebook.  Patient learns that restaurant meals can sabotage health goals because they are often packed with calories, fat, sodium, and sugar. Recommendations include strategies to plan ahead and to communicate with the manager, chef, or server to help order a healthier meal.  Planning Your Eating Strategy  Clinical staff conducted group or individual video education with verbal and written material and guidebook.  Patient learns about the Pritikin Eating Plan and its benefit of reducing the risk of disease. The Pritikin Eating Plan does not focus on calories. Instead, it emphasizes high-quality, nutrient-rich foods. By knowing the characteristics of the foods, we choose, we can determine their calorie density and make informed decisions.  Targeting Your Nutrition Priorities  Clinical staff conducted group or individual video education with verbal and written material and guidebook.  Patient learns that lifestyle habits have a tremendous impact on disease risk and progression. This video provides eating and physical activity recommendations based on your personal health goals, such as reducing LDL cholesterol, losing weight,  preventing or controlling type 2 diabetes, and reducing high blood pressure.  Vitamins and Minerals  Clinical staff conducted group or individual video education with verbal and written material and guidebook.  Patient learns different ways to obtain key vitamins and minerals, including through a recommended healthy diet. It is important to discuss all supplements you take with your doctor.   Healthy Mind-Set    Smoking Cessation  Clinical staff conducted group or individual video education with verbal and written material and guidebook.  Patient learns that cigarette smoking and tobacco addiction pose a serious health risk which affects millions of people. Stopping smoking will significantly reduce the risk of heart disease, lung disease, and many forms of cancer. Recommended strategies for quitting are covered, including working with your doctor to develop a successful plan.  Culinary   Becoming a Set designer conducted group or individual video education with verbal and written material and guidebook.  Patient learns that cooking at home can be healthy, cost-effective, quick, and puts them in control. Keys to cooking healthy recipes will include looking at your recipe, assessing your equipment needs, planning ahead, making it simple, choosing cost-effective seasonal ingredients, and limiting the use of added fats, salts, and sugars.  Cooking - Breakfast and Snacks  Clinical staff conducted group or individual video education with verbal and written material and guidebook.  Patient learns how important breakfast is to satiety and nutrition through the entire day. Recommendations include key foods to eat during breakfast to help stabilize blood sugar levels and to prevent overeating at meals later in the day. Planning ahead is also a key component.  Cooking - Educational psychologist conducted group or individual video education with verbal and written material and  guidebook.  Patient learns eating strategies to improve overall health, including an approach to cook more at home. Recommendations include thinking of animal protein as a side on your plate rather than center stage and focusing instead on lower calorie dense options like vegetables, fruits, whole grains, and plant-based proteins, such as beans. Making sauces in large quantities to freeze for later  and leaving the skin on your vegetables are also recommended to maximize your experience.  Cooking - Healthy Salads and Dressing Clinical staff conducted group or individual video education with verbal and written material and guidebook.  Patient learns that vegetables, fruits, whole grains, and legumes are the foundations of the Pritikin Eating Plan. Recommendations include how to incorporate each of these in flavorful and healthy salads, and how to create homemade salad dressings. Proper handling of ingredients is also covered. Cooking - Soups and [employer] - Soups and Desserts Clinical staff conducted group or individual video education with verbal and written material and guidebook.  Patient learns that Pritikin soups and desserts make for easy, nutritious, and delicious snacks and meal components that are low in sodium, fat, sugar, and calorie density, while high in vitamins, minerals, and filling fiber. Recommendations include simple and healthy ideas for soups and desserts.   Overview     The Pritikin Solution Program Overview Clinical staff conducted group or individual video education with verbal and written material and guidebook.  Patient learns that the results of the Pritikin Program have been documented in more than 100 articles published in peer-reviewed journals, and the benefits include reducing risk factors for (and, in some cases, even reversing) high cholesterol, high blood pressure, type 2 diabetes, obesity, and more! An overview of the three key pillars of the Pritikin Program  will be covered: eating well, doing regular exercise, and having a healthy mind-set.  WORKSHOPS  Exercise: Exercise Basics: Building Your Action Plan Clinical staff led group instruction and group discussion with PowerPoint presentation and patient guidebook. To enhance the learning environment the use of posters, models and videos may be added. At the conclusion of this workshop, patients will comprehend the difference between physical activity and exercise, as well as the benefits of incorporating both, into their routine. Patients will understand the FITT (Frequency, Intensity, Time, and Type) principle and how to use it to build an exercise action plan. In addition, safety concerns and other considerations for exercise and cardiac rehab will be addressed by the presenter. The purpose of this lesson is to promote a comprehensive and effective weekly exercise routine in order to improve patients' overall level of fitness.   Managing Heart Disease: Your Path to a Healthier Heart Clinical staff led group instruction and group discussion with PowerPoint presentation and patient guidebook. To enhance the learning environment the use of posters, models and videos may be added.At the conclusion of this workshop, patients will understand the anatomy and physiology of the heart. Additionally, they will understand how Pritikin's three pillars impact the risk factors, the progression, and the management of heart disease.  The purpose of this lesson is to provide a high-level overview of the heart, heart disease, and how the Pritikin lifestyle positively impacts risk factors.  Exercise Biomechanics Clinical staff led group instruction and group discussion with PowerPoint presentation and patient guidebook. To enhance the learning environment the use of posters, models and videos may be added. Patients will learn how the structural parts of their bodies function and how these functions impact their daily  activities, movement, and exercise. Patients will learn how to promote a neutral spine, learn how to manage pain, and identify ways to improve their physical movement in order to promote healthy living. The purpose of this lesson is to expose patients to common physical limitations that impact physical activity. Participants will learn practical ways to adapt and manage aches and pains, and to minimize their effect  on regular exercise. Patients will learn how to maintain good posture while sitting, walking, and lifting.  Balance Training and Fall Prevention  Clinical staff led group instruction and group discussion with PowerPoint presentation and patient guidebook. To enhance the learning environment the use of posters, models and videos may be added. At the conclusion of this workshop, patients will understand the importance of their sensorimotor skills (vision, proprioception, and the vestibular system) in maintaining their ability to balance as they age. Patients will apply a variety of balancing exercises that are appropriate for their current level of function. Patients will understand the common causes for poor balance, possible solutions to these problems, and ways to modify their physical environment in order to minimize their fall risk. The purpose of this lesson is to teach patients about the importance of maintaining balance as they age and ways to minimize their risk of falling.  WORKSHOPS   Nutrition:  Fueling a Ship broker led group instruction and group discussion with PowerPoint presentation and patient guidebook. To enhance the learning environment the use of posters, models and videos may be added. Patients will review the foundational principles of the Pritikin Eating Plan and understand what constitutes a serving size in each of the food groups. Patients will also learn Pritikin-friendly foods that are better choices when away from home and review make-ahead  meal and snack options. Calorie density will be reviewed and applied to three nutrition priorities: weight maintenance, weight loss, and weight gain. The purpose of this lesson is to reinforce (in a group setting) the key concepts around what patients are recommended to eat and how to apply these guidelines when away from home by planning and selecting Pritikin-friendly options. Patients will understand how calorie density may be adjusted for different weight management goals.  Mindful Eating  Clinical staff led group instruction and group discussion with PowerPoint presentation and patient guidebook. To enhance the learning environment the use of posters, models and videos may be added. Patients will briefly review the concepts of the Pritikin Eating Plan and the importance of low-calorie dense foods. The concept of mindful eating will be introduced as well as the importance of paying attention to internal hunger signals. Triggers for non-hunger eating and techniques for dealing with triggers will be explored. The purpose of this lesson is to provide patients with the opportunity to review the basic principles of the Pritikin Eating Plan, discuss the value of eating mindfully and how to measure internal cues of hunger and fullness using the Hunger Scale. Patients will also discuss reasons for non-hunger eating and learn strategies to use for controlling emotional eating.  Targeting Your Nutrition Priorities Clinical staff led group instruction and group discussion with PowerPoint presentation and patient guidebook. To enhance the learning environment the use of posters, models and videos may be added. Patients will learn how to determine their genetic susceptibility to disease by reviewing their family history. Patients will gain insight into the importance of diet as part of an overall healthy lifestyle in mitigating the impact of genetics and other environmental insults. The purpose of this lesson is to  provide patients with the opportunity to assess their personal nutrition priorities by looking at their family history, their own health history and current risk factors. Patients will also be able to discuss ways of prioritizing and modifying the Pritikin Eating Plan for their highest risk areas  Menu  Clinical staff led group instruction and group discussion with PowerPoint presentation and patient guidebook. To enhance  the learning environment the use of posters, models and videos may be added. Using menus brought in from [employer], or printed from [employer], patients will apply the Pritikin dining out guidelines that were presented in the [employer] video. Patients will also be able to practice these guidelines in a variety of provided scenarios. The purpose of this lesson is to provide patients with the opportunity to practice hands-on learning of the Pritikin Dining Out guidelines with actual menus and practice scenarios.  Label Reading Clinical staff led group instruction and group discussion with PowerPoint presentation and patient guidebook. To enhance the learning environment the use of posters, models and videos may be added. Patients will review and discuss the Pritikin label reading guidelines presented in Pritikin's Label Reading Educational series video. Using fool labels brought in from local grocery stores and markets, patients will apply the label reading guidelines and determine if the packaged food meet the Pritikin guidelines. The purpose of this lesson is to provide patients with the opportunity to review, discuss, and practice hands-on learning of the Pritikin Label Reading guidelines with actual packaged food labels. Cooking School  Pritikin's [employer] are designed to teach patients ways to prepare quick, simple, and affordable recipes at home. The importance of nutrition's role in chronic disease risk reduction is reflected in its  emphasis in the overall Pritikin program. By learning how to prepare essential core Pritikin Eating Plan recipes, patients will increase control over what they eat; be able to customize the flavor of foods without the use of added salt, sugar, or fat; and improve the quality of the food they consume. By learning a set of core recipes which are easily assembled, quickly prepared, and affordable, patients are more likely to prepare more healthy foods at home. These workshops focus on convenient breakfasts, simple entres, side dishes, and desserts which can be prepared with minimal effort and are consistent with nutrition recommendations for cardiovascular risk reduction. Cooking [employer] are taught by a Armed forces logistics/support/administrative officer (RD) who has been trained by the [employer]. The chef or RD has a clear understanding of the importance of minimizing - if not completely eliminating - added fat, sugar, and sodium in recipes. Throughout the series of Cooking School Workshop sessions, patients will learn about healthy ingredients and efficient methods of cooking to build confidence in their capability to prepare    Cooking School weekly topics:  Adding Flavor- Sodium-Free  Fast and Healthy Breakfasts  Powerhouse Plant-Based Proteins  Satisfying Salads and Dressings  Simple Sides and Sauces  International Cuisine-Spotlight on the [employer] Zones  Delicious Desserts  Savory Soups  [employer] - Meals in a Astronomer Appetizers and Snacks  Comforting Weekend Breakfasts  One-Pot Wonders   Fast Evening Meals  Landscape architect Your Pritikin Plate  WORKSHOPS   Healthy Mindset (Psychosocial):  Focused Goals, Sustainable Changes Clinical staff led group instruction and group discussion with PowerPoint presentation and patient guidebook. To enhance the learning environment the use of posters, models and videos may be added. Patients will be able to apply effective  goal setting strategies to establish at least one personal goal, and then take consistent, meaningful action toward that goal. They will learn to identify common barriers to achieving personal goals and develop strategies to overcome them. Patients will also gain an understanding of how our mind-set can impact our ability to achieve goals and the importance of cultivating a positive and growth-oriented mind-set.  The purpose of this lesson is to provide patients with a deeper understanding of how to set and achieve personal goals, as well as the tools and strategies needed to overcome common obstacles which may arise along the way.  From Head to Heart: The Power of a Healthy Outlook  Clinical staff led group instruction and group discussion with PowerPoint presentation and patient guidebook. To enhance the learning environment the use of posters, models and videos may be added. Patients will be able to recognize and describe the impact of emotions and mood on physical health. They will discover the importance of self-care and explore self-care practices which may work for them. Patients will also learn how to utilize the 4 C's to cultivate a healthier outlook and better manage stress and challenges. The purpose of this lesson is to demonstrate to patients how a healthy outlook is an essential part of maintaining good health, especially as they continue their cardiac rehab journey.  Healthy Sleep for a Healthy Heart Clinical staff led group instruction and group discussion with PowerPoint presentation and patient guidebook. To enhance the learning environment the use of posters, models and videos may be added. At the conclusion of this workshop, patients will be able to demonstrate knowledge of the importance of sleep to overall health, well-being, and quality of life. They will understand the symptoms of, and treatments for, common sleep disorders. Patients will also be able to identify daytime and nighttime  behaviors which impact sleep, and they will be able to apply these tools to help manage sleep-related challenges. The purpose of this lesson is to provide patients with a general overview of sleep and outline the importance of quality sleep. Patients will learn about a few of the most common sleep disorders. Patients will also be introduced to the concept of "sleep hygiene," and discover ways to self-manage certain sleeping problems through simple daily behavior changes. Finally, the workshop will motivate patients by clarifying the links between quality sleep and their goals of heart-healthy living.   Recognizing and Reducing Stress Clinical staff led group instruction and group discussion with PowerPoint presentation and patient guidebook. To enhance the learning environment the use of posters, models and videos may be added. At the conclusion of this workshop, patients will be able to understand the types of stress reactions, differentiate between acute and chronic stress, and recognize the impact that chronic stress has on their health. They will also be able to apply different coping mechanisms, such as reframing negative self-talk. Patients will have the opportunity to practice a variety of stress management techniques, such as deep abdominal breathing, progressive muscle relaxation, and/or guided imagery.  The purpose of this lesson is to educate patients on the role of stress in their lives and to provide healthy techniques for coping with it.  Learning Barriers/Preferences:  Learning Barriers/Preferences - 08/04/23 1503       Learning Barriers/Preferences   Learning Barriers Sight;Hearing   wears glass, bilateral hearing aids   Learning Preferences Audio;Computer/Internet;Group Instruction;Individual Instruction;Pictoral;Skilled Demonstration;Verbal Instruction;Video          Education Topics:  Knowledge Questionnaire Score:  Knowledge Questionnaire Score - 08/04/23 1503        Knowledge Questionnaire Score   Pre Score 21/24          Core Components/Risk Factors/Patient Goals at Admission:  Personal Goals and Risk Factors at Admission - 08/04/23 1503       Core Components/Risk Factors/Patient Goals on Admission   Hypertension Yes    Intervention  Provide education on lifestyle modifcations including regular physical activity/exercise, weight management, moderate sodium restriction and increased consumption of fresh fruit, vegetables, and low fat dairy, alcohol  moderation, and smoking cessation.;Monitor prescription use compliance.    Expected Outcomes Short Term: Continued assessment and intervention until BP is < 140/37mm HG in hypertensive participants. < 130/26mm HG in hypertensive participants with diabetes, heart failure or chronic kidney disease.;Long Term: Maintenance of blood pressure at goal levels.    Lipids Yes    Intervention Provide education and support for participant on nutrition & aerobic/resistive exercise along with prescribed medications to achieve LDL 70mg , HDL >40mg .    Expected Outcomes Short Term: Participant states understanding of desired cholesterol values and is compliant with medications prescribed. Participant is following exercise prescription and nutrition guidelines.;Long Term: Cholesterol controlled with medications as prescribed, with individualized exercise RX and with personalized nutrition plan. Value goals: LDL < 70mg , HDL > 40 mg.    Stress Yes    Intervention Offer individual and/or small group education and counseling on adjustment to heart disease, stress management and health-related lifestyle change. Teach and support self-help strategies.;Refer participants experiencing significant psychosocial distress to appropriate mental health specialists for further evaluation and treatment. When possible, include family members and significant others in education/counseling sessions.    Expected Outcomes Short Term: Participant  demonstrates changes in health-related behavior, relaxation and other stress management skills, ability to obtain effective social support, and compliance with psychotropic medications if prescribed.;Long Term: Emotional wellbeing is indicated by absence of clinically significant psychosocial distress or social isolation.          Core Components/Risk Factors/Patient Goals Review:    Core Components/Risk Factors/Patient Goals at Discharge (Final Review):    ITP Comments:  ITP Comments     Row Name 08/04/23 1038           ITP Comments Gaylyn Keas, MD: Medical Director.  Introduction to the [facility]/Intensive cardaic rehab.  Initial orientation packet reviewed with the patient.          Comments: Participant attended orientation for the cardiac rehabilitation program on  08/04/2023  to perform initial intake and exercise walk test. Patient introduced to the [facility] education and orientation packet was reviewed. Completed 6-minute walk test, measurements, initial ITP, and exercise prescription. Vital signs stable. Telemetry-normal sinus rhythm,frequent PVC's, PAC's, asymptomatic.   Service time was from 10:25 to 13:01.

## 2023-08-08 ENCOUNTER — Ambulatory Visit (HOSPITAL_COMMUNITY)

## 2023-08-09 ENCOUNTER — Encounter (HOSPITAL_COMMUNITY): Payer: Self-pay | Admitting: Surgery

## 2023-08-10 ENCOUNTER — Inpatient Hospital Stay (HOSPITAL_COMMUNITY): Admission: RE | Admit: 2023-08-10 | Source: Ambulatory Visit

## 2023-08-10 ENCOUNTER — Telehealth (HOSPITAL_COMMUNITY): Payer: Self-pay | Admitting: *Deleted

## 2023-08-10 NOTE — Telephone Encounter (Signed)
 I spoke with Mrs Langbehn she says she is not returning to participate in the program and has been discharged. Will cancel appointments as requested.Monte Antonio RN BSN

## 2023-08-12 ENCOUNTER — Ambulatory Visit (HOSPITAL_COMMUNITY)

## 2023-08-15 ENCOUNTER — Encounter: Payer: Self-pay | Admitting: Family Medicine

## 2023-08-15 ENCOUNTER — Ambulatory Visit (INDEPENDENT_AMBULATORY_CARE_PROVIDER_SITE_OTHER): Admitting: Family Medicine

## 2023-08-15 ENCOUNTER — Ambulatory Visit (HOSPITAL_COMMUNITY)

## 2023-08-15 VITALS — BP 138/68 | HR 78 | Temp 97.9°F | Ht 60.0 in | Wt 116.0 lb

## 2023-08-15 DIAGNOSIS — Z13220 Encounter for screening for lipoid disorders: Secondary | ICD-10-CM

## 2023-08-15 DIAGNOSIS — Z952 Presence of prosthetic heart valve: Secondary | ICD-10-CM

## 2023-08-15 DIAGNOSIS — M0600 Rheumatoid arthritis without rheumatoid factor, unspecified site: Secondary | ICD-10-CM

## 2023-08-15 DIAGNOSIS — E039 Hypothyroidism, unspecified: Secondary | ICD-10-CM | POA: Diagnosis not present

## 2023-08-15 DIAGNOSIS — I739 Peripheral vascular disease, unspecified: Secondary | ICD-10-CM

## 2023-08-15 DIAGNOSIS — M810 Age-related osteoporosis without current pathological fracture: Secondary | ICD-10-CM

## 2023-08-15 DIAGNOSIS — I6529 Occlusion and stenosis of unspecified carotid artery: Secondary | ICD-10-CM

## 2023-08-15 DIAGNOSIS — M5412 Radiculopathy, cervical region: Secondary | ICD-10-CM

## 2023-08-15 DIAGNOSIS — Z131 Encounter for screening for diabetes mellitus: Secondary | ICD-10-CM | POA: Diagnosis not present

## 2023-08-15 DIAGNOSIS — N3281 Overactive bladder: Secondary | ICD-10-CM

## 2023-08-15 DIAGNOSIS — J309 Allergic rhinitis, unspecified: Secondary | ICD-10-CM

## 2023-08-15 DIAGNOSIS — M353 Polymyalgia rheumatica: Secondary | ICD-10-CM | POA: Diagnosis not present

## 2023-08-15 DIAGNOSIS — Z7952 Long term (current) use of systemic steroids: Secondary | ICD-10-CM | POA: Diagnosis not present

## 2023-08-15 DIAGNOSIS — M1120 Other chondrocalcinosis, unspecified site: Secondary | ICD-10-CM | POA: Insufficient documentation

## 2023-08-15 DIAGNOSIS — C44121 Squamous cell carcinoma of skin of unspecified eyelid, including canthus: Secondary | ICD-10-CM | POA: Insufficient documentation

## 2023-08-15 DIAGNOSIS — Z0000 Encounter for general adult medical examination without abnormal findings: Secondary | ICD-10-CM

## 2023-08-15 LAB — LIPID PANEL
Cholesterol: 181 mg/dL (ref 0–200)
HDL: 56.7 mg/dL (ref >39.00)
LDL Cholesterol: 107 mg/dL — ABNORMAL HIGH (ref 0–99)
NonHDL: 124.6
Total CHOL/HDL Ratio: 3
Triglycerides: 90 mg/dL (ref 0.0–149.0)
VLDL: 18 mg/dL (ref 0.0–40.0)

## 2023-08-15 LAB — TSH: TSH: 2.24 u[IU]/mL (ref 0.35–5.50)

## 2023-08-15 LAB — HEMOGLOBIN A1C: Hgb A1c MFr Bld: 5.3 % (ref 4.6–6.5)

## 2023-08-15 MED ORDER — MIRABEGRON ER 25 MG PO TB24: 25.0000 mg | ORAL_TABLET | Freq: Every day | ORAL | 5 refills | Status: AC

## 2023-08-15 MED ORDER — MONTELUKAST SODIUM 10 MG PO TABS: 10.0000 mg | ORAL_TABLET | Freq: Every day | ORAL | 1 refills | Status: DC

## 2023-08-15 MED ORDER — ALBUTEROL SULFATE HFA 108 (90 BASE) MCG/ACT IN AERS: 2.0000 | INHALATION_SPRAY | Freq: Four times a day (QID) | RESPIRATORY_TRACT | 0 refills | Status: AC | PRN

## 2023-08-15 MED ORDER — PREGABALIN 25 MG PO CAPS: 25.0000 mg | ORAL_CAPSULE | Freq: Every day | ORAL | 1 refills | Status: DC

## 2023-08-15 NOTE — Patient Instructions (Addendum)

## 2023-08-15 NOTE — Progress Notes (Signed)
 Margaret [name] , 10-15-1929, 89 y.o., female MRN: [id number] Patient Care Team    Relationship Specialty Notifications Start End  Catherine Charlies LABOR, DO PCP - General Family Medicine  03/08/19   Lonni Slain, MD PCP - Cardiology Cardiology  04/30/22   Wendel Lurena POUR, MD PCP - Structural Heart Cardiology  07/13/23   Valdemar Rogue, MD Consulting Physician Ophthalmology  03/29/18   Lelon JONELLE Ferrari, MD Consulting Physician Ophthalmology  03/29/18   Tricia Tawni CROME, MD Referring Physician Dermatology  08/02/19   Ishmael Slough, MD Consulting Physician Rheumatology  12/16/22     Chief Complaint  Patient presents with   Annual Exam    Chronic Conditions/illness Management.  Pt is not fasting.       Subjective: Pt presents for an OV for Cpe and  Chronic Conditions/illness Management All past medical history, surgical history, allergies, family history, immunizations and social history was obtained from the patient today and updated into the electronic medical record.   Health maintenance:  Immunizations: Influenza vaccine UTD, Tdap due next year-encourage patient to have completed at pharmacy, pneumococcal completed, Shingrix completed. Infectious disease screening: Completed   PMR/RA: She is established with rheumatology.  She is on low-dose prednisone  and colchicine, and had been prescribed MTX and  Plaquenil  at different times in the past. Prior note: Her CMP, CRP, ESR, TSH, CBC and Lyme titers were all normal. Her ANA was positive, 1:640 nuclear homogenous. CCP and RF normal.  Hypothyroidism, unspecified type Patient reports compliance with levothyroxine  50 mcg daily.    Nocturnal leg cramps/RLS Patient reports the leg cramps have improved with the use of Lyrica  25 mg QHS.  Would like to continue this medication if she feels it is working well, patient reports nightly compliance Tried medications: Mirapex   Seasonal allergies/Mild intermittent asthma, unspecified  whether complicated Patient reports she has asthma and allergies.    Patient reports compliance with Singulair  and her symptoms are well-controlled.  She also uses Flonase and albuterol  as needed.  OAB/urinary incontinence: Pt would like to try mirabegron  now. She had tried other agents in the past that resulted with dizziness and and did not work well for her.       08/04/2023    3:05 PM 08/04/2023   11:25 AM 05/04/2023   11:40 AM 01/12/2023    3:05 PM 12/16/2022   10:04 AM  Depression screen PHQ 2/9  Decreased Interest 0 0 0 0 0  Down, Depressed, Hopeless 0 0 0 0 0  PHQ - 2 Score 0 0 0 0 0  Altered sleeping 0 0 1 1   Tired, decreased energy 1 1 1 1    Change in appetite 0 0 1 0   Feeling bad or failure about yourself  0 0 0 0   Trouble concentrating 1 1 0 0   Moving slowly or fidgety/restless 1 1 0 0   Suicidal thoughts 0 0 0 0   PHQ-9 Score 3 3 3 2    Difficult doing work/chores Somewhat difficult Somewhat difficult Not difficult at all Not difficult at all     Allergies  Allergen Reactions   Augmentin  [Amoxicillin -Pot Clavulanate] Swelling and Rash    Swelling in face   Lactose Intolerance (Gi) Diarrhea    bloating   Penicillin G Swelling    swelling of face   Social History   Social History Narrative   Marital status/children/pets: Widowed.  Moved to [place]  from [place]  [facility] area) 2019.  She  shares a home with 1 of her daughters.  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   Cervical radiculopathy 05/16/2015   DDD C4-C5, C5-C6,  and C6-C7.  Facet arthropathy throughout cervical spine.  Normal foraminal narrowing secondary to uncovertebral  arthropathy is seen bilaterally at C3-C4 and C5/C6   DDD (degenerative disc disease), lumbar 04/26/2015   Mild levoscoliosis, right anterior listhesis of L4 and L5.  DDD at every level.  Facet arthropathy at multiple levels.   Glaucoma    both eyes, mild opened angle   History of colon polyps    History of frequent urinary tract infections    klebs- pansensitive. c. 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 Shyrl and Dr Wendel   Syncope 05/17/2017   Thyroid  disease    Urinary incontinence    Vertigo    had been prescribed antivert   Vitamin D  deficiency    Past Surgical History:  Procedure Laterality Date   ABDOMINAL AORTOGRAM N/A 04/26/2023   Procedure: ABDOMINAL AORTOGRAM;  Surgeon: Wendel Lurena POUR, MD;  Location: [facility];  Service: Cardiovascular;  Laterality: N/A;   CARPAL TUNNEL RELEASE Bilateral 2019   CATARACT EXTRACTION Bilateral 1997   COLONOSCOPY  2010   INTRAOPERATIVE TRANSTHORACIC ECHOCARDIOGRAM N/A 07/12/2023   Procedure: ECHOCARDIOGRAM, TRANSTHORACIC;  Surgeon: Wendel Lurena POUR, MD;  Location: [facility];  Service: Cardiovascular;  Laterality: N/A;   NASAL SEPTUM SURGERY  2007   RIGHT HEART CATH AND CORONARY ANGIOGRAPHY N/A 04/26/2023   Procedure: RIGHT HEART CATH AND CORONARY ANGIOGRAPHY;   Surgeon: Wendel Lurena POUR, MD;  Location: [facility];  Service: Cardiovascular;  Laterality: N/A;   WRIST FRACTURE SURGERY Right 1996   Family History  Problem Relation Age of Onset   Arthritis Mother    COPD Mother    Prostate cancer Father    Arthritis Father    Hearing loss Father    Heart disease Father    Heart attack Father    Stroke Maternal Grandmother    Liver cancer Sister    Arthritis Brother    Prostate cancer Brother    COPD Brother    Heart disease Brother    Breast cancer Daughter  Blindness Maternal Grandfather    Stroke Maternal Grandfather    COPD Paternal Grandmother    Breast cancer Paternal Grandmother    Allergies as of 08/15/2023       Reactions   Augmentin  [amoxicillin -pot Clavulanate] Swelling, Rash   Swelling in face   Lactose Intolerance (gi) Diarrhea   bloating   Penicillin G Swelling   swelling of face        Medication List        Accurate as of August 15, 2023 11:16 AM. If you have any questions, ask your nurse or doctor.          STOP taking these medications    azithromycin  500 MG tablet Commonly known as: Zithromax  Stopped by: Charlies Bellini       TAKE these medications    acetaminophen  500 MG tablet Commonly known as: TYLENOL  Take 500 mg by mouth 2 (two) times daily.   albuterol  108 (90 Base) MCG/ACT inhaler Commonly known as: VENTOLIN  HFA Inhale 2 puffs into the lungs every 6 (six) hours as needed for wheezing or shortness of breath.   aspirin  81 MG chewable tablet Chew 1 tablet (81 mg total) by mouth daily.   CALCIUM 600 + D PO Take 1 tablet by mouth daily.   cycloSPORINE  0.05 % ophthalmic emulsion Commonly known as: RESTASIS  Place 1 drop into both eyes 2 (two) times daily.   diclofenac Sodium 1 % Gel Commonly known as: VOLTAREN Apply 1 Application topically 2 (two) times daily.   fluocinonide  ointment 0.05 % Commonly known as: LIDEX  Apply 1 Application topically 2 (two) times daily.    leflunomide  10 MG tablet Commonly known as: ARAVA  Take 10 mg by mouth daily.   levocetirizine 5 MG tablet Commonly known as: XYZAL Take 5 mg by mouth daily as needed for allergies.   levothyroxine  50 MCG tablet Commonly known as: SYNTHROID  Take 1 tablet (50 mcg total) by mouth daily before breakfast.   mirabegron  ER 25 MG Tb24 tablet Commonly known as: MYRBETRIQ  Take 1 tablet (25 mg total) by mouth daily. Started by: Charlies Bellini   montelukast  10 MG tablet Commonly known as: SINGULAIR  Take 1 tablet (10 mg total) by mouth at bedtime.   predniSONE  5 MG tablet Commonly known as: DELTASONE  Take 5 mg by mouth daily as needed (arthritis).   pregabalin  25 MG capsule Commonly known as: Lyrica  Take 1 capsule (25 mg total) by mouth at bedtime.   PreserVision AREDS 2+Multi Vit Caps Take 1 tablet by mouth daily. What changed: Another medication with the same name was removed. Continue taking this medication, and follow the directions you see here. Changed by: Tyshauna Finkbiner   PROBIOTIC PO Take 1 capsule by mouth daily.   Systane Complete PF 0.6 % Soln Generic drug: Propylene Glycol (PF) Place 1 drop into both eyes 2 (two) times daily as needed (dry eyes).        All past medical history, surgical history, allergies, family history, immunizations andmedications were updated in the EMR today and reviewed under the history and medication portions of their EMR.     ROS: Negative, with the exception of above mentioned in HPI  Objective:  BP 138/68   Pulse 78   Temp 97.9 F (36.6 C)   Ht 5' (1.524 m)   Wt 116 lb (52.6 kg)   SpO2 98%   BMI 22.65 kg/m  Body mass index is 22.65 kg/m. Physical Exam Vitals and nursing note reviewed.  Constitutional:  General: She is not in acute distress.    Appearance: Normal appearance. She is not ill-appearing or toxic-appearing.  HENT:     Head: Normocephalic and atraumatic.     Right Ear: Tympanic membrane, ear canal and external  ear normal. There is no impacted cerumen.     Left Ear: Tympanic membrane, ear canal and external ear normal. There is no impacted cerumen.     Nose: No congestion or rhinorrhea.     Mouth/Throat:     Mouth: Mucous membranes are moist.     Pharynx: Oropharynx is clear. No oropharyngeal exudate or posterior oropharyngeal erythema.   Eyes:     General: No scleral icterus.       Right eye: No discharge.        Left eye: No discharge.     Extraocular Movements: Extraocular movements intact.     Conjunctiva/sclera: Conjunctivae normal.     Pupils: Pupils are equal, round, and reactive to light.    Cardiovascular:     Rate and Rhythm: Normal rate and regular rhythm.     Pulses: Normal pulses.     Heart sounds: Normal heart sounds. No murmur heard.    No friction rub. No gallop.  Pulmonary:     Effort: Pulmonary effort is normal. No respiratory distress.     Breath sounds: Normal breath sounds. No stridor. No wheezing, rhonchi or rales.  Chest:     Chest wall: No tenderness.  Abdominal:     General: Abdomen is flat. Bowel sounds are normal. There is no distension.     Palpations: Abdomen is soft. There is no mass.     Tenderness: There is no abdominal tenderness. There is no right CVA tenderness, left CVA tenderness, guarding or rebound.     Hernia: No hernia is present.   Musculoskeletal:        General: No swelling, tenderness or deformity. Normal range of motion.     Cervical back: Normal range of motion and neck supple. No rigidity or tenderness.     Right lower leg: No edema.     Left lower leg: No edema.  Lymphadenopathy:     Cervical: No cervical adenopathy.   Skin:    General: Skin is warm and dry.     Coloration: Skin is not jaundiced or pale.     Findings: No bruising, erythema, lesion or rash.   Neurological:     General: No focal deficit present.     Mental Status: She is alert and oriented to person, place, and time. Mental status is at baseline.     Cranial  Nerves: No cranial nerve deficit.     Sensory: No sensory deficit.     Motor: No weakness.     Coordination: Coordination normal.     Gait: Gait normal.     Deep Tendon Reflexes: Reflexes normal.   Psychiatric:        Mood and Affect: Mood normal.        Behavior: Behavior normal.        Thought Content: Thought content normal.        Judgment: Judgment normal.     No results found. No results found. No results found for this or any previous visit (from the past 24 hours).   Assessment/Plan: Shalandria Elsbernd is a 89 y.o. female present for OV for CPE and chronic Conditions/illness Management Hypothyroidism, unspecified type Continue levothyroxine  50 mcg daily. refills will be provided in appropriate dose based on lab  result today TSH collected today  Vit d def/osteoporosis:  prolia  injections are provided by rheumatology.  PTH and calcium levels collected today. Vitamin D  chronically elevated without supplementation  Rheumatoid arthritis/PMR/long-term steroid use: Long-term steroid use Continue routine follow-ups with rheumatology - Hemoglobin A1c - Lipid panel  Nocturnal leg cramps/RLS/PVD Stable Continue Lyrica  25 mg nightly Failed:  Mirapex . [place]  controlled substance database reviewed 08/15/23  Seasonal allergies/Mild intermittent asthma, unspecified whether complicated Stable Continue Singulair   Continue albuterol  as needed  Continue Flonase   Status post TAVR procedure-aortic valve replacement for stenosis of carotid artery Follow-up per cardiology  OAB/Urinary incontinence: Mirabegron  25 mg every day prescribed. Failed other agents-SE  Age-related osteoporosis without current pathological fracture Pth/ ca ordered  Hypocalcemia - PTH, Intact and Calcium  Routine general medical examination at a health care facility (Primary) Patient was encouraged to exercise greater than 150 minutes a week. Patient was encouraged to choose a diet filled  with fresh fruits and vegetables, and lean meats. AVS provided to patient today for education/recommendation on gender specific health and safety maintenance. Immunizations: Influenza vaccine UTD, Tdap due next year-encourage patient to have completed at pharmacy, pneumococcal completed, Shingrix completed. Infectious disease screening: Completed   Reviewed expectations re: course of current medical issues. Discussed self-management of symptoms. Outlined signs and symptoms indicating need for more acute intervention. Patient verbalized understanding and all questions were answered. Patient received an After-Visit Summary.    Orders Placed This Encounter  Procedures   Hemoglobin A1c   Lipid panel   TSH   PTH, Intact and Calcium    Meds ordered this encounter  Medications   albuterol  (VENTOLIN  HFA) 108 (90 Base) MCG/ACT inhaler    Sig: Inhale 2 puffs into the lungs every 6 (six) hours as needed for wheezing or shortness of breath.    Dispense:  1 each    Refill:  0    Hold until pt request   montelukast  (SINGULAIR ) 10 MG tablet    Sig: Take 1 tablet (10 mg total) by mouth at bedtime.    Dispense:  90 tablet    Refill:  1   pregabalin  (LYRICA ) 25 MG capsule    Sig: Take 1 capsule (25 mg total) by mouth at bedtime.    Dispense:  90 capsule    Refill:  1   mirabegron  ER (MYRBETRIQ ) 25 MG TB24 tablet    Sig: Take 1 tablet (25 mg total) by mouth daily.    Dispense:  30 tablet    Refill:  5    Referral Orders  No referral(s) requested today     Note is dictated utilizing voice recognition software. Although note has been proof read prior to signing, occasional typographical errors still can be missed. If any questions arise, please do not hesitate to call for verification.   electronically signed by:  Charlies Bellini, DO  [facility] [place]

## 2023-08-16 ENCOUNTER — Ambulatory Visit: Payer: Self-pay | Admitting: Family Medicine

## 2023-08-16 DIAGNOSIS — E039 Hypothyroidism, unspecified: Secondary | ICD-10-CM

## 2023-08-16 LAB — PTH, INTACT AND CALCIUM
Calcium: 9.5 mg/dL (ref 8.6–10.4)
PTH: 32 pg/mL (ref 16–77)

## 2023-08-16 MED ORDER — LEVOTHYROXINE SODIUM 50 MCG PO TABS: 50.0000 ug | ORAL_TABLET | Freq: Every day | ORAL | 3 refills | Status: AC

## 2023-08-17 ENCOUNTER — Ambulatory Visit (HOSPITAL_COMMUNITY)

## 2023-08-19 ENCOUNTER — Ambulatory Visit (HOSPITAL_COMMUNITY)

## 2023-08-21 NOTE — Progress Notes (Unsigned)
 [facility]   MULTIDISCIPLINARY HEART VALVE CLINIC                                     Cardiology Office Note:    Date:  08/22/2023   ID:  Margaret [name], DOB [birth year], MRN [id number]  PCP:  Catherine Charlies LABOR, DO  [facility] Cardiologist:  Shelda Bruckner, MD  [facility] Structural heart: Lurena MARLA Red, MD [facility] Electrophysiologist:  None   Referring MD: Catherine Charlies LABOR, DO   1 month s/p TAVR  History of Present Illness:    Margaret [name] is a 89 y.o. 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  Echo 04/08/23 showed EF 55%, moderate asymmetric LHV, and severe AS with mean grad 40 mmHg and severe MAC with moderate MR & mild MS. L/RHC 04/26/23 showed minimal nonobstructive coronary artery disease of left dominant system. S/p successful TAVR with a 23 mm Edwards Sapien 3 Ultra Resilia THV via the TF approach on 07/12/23. Post operative echo showed EF 55%, moderate RVE, mod MAC with mild MR/MS, normally functioning TAVR with a mean gradient of 6 mmHg and no PVL. Started on a baby aspirin  81mg  daily.   Today the patient presents to clinic for follow up. Here with her daughter. Repainted all the kitchen cabinets recently. She still struggles with peripheral neuropathy and fatigue but she does think she is overall improved since TAVR with a little more energy and stamina. No CP and only occasional SOB. She has some mild LE edema at the end of the day but no orthopnea or PND. No dizziness or syncope. No blood in stool or urine. No palpitations.     Past Medical History:  Diagnosis Date   Allergy    Asthma    Carotid artery stenosis 10/17/2012   stable report in 2018- moderate on right, minimal on left   Carpal tunnel syndrome 2018   left.   Cervical radiculopathy 05/16/2015   DDD C4-C5, C5-C6, and  C6-C7.  Facet arthropathy throughout cervical spine.  Normal foraminal narrowing secondary to uncovertebral  arthropathy is seen bilaterally at C3-C4 and C5/C6   DDD (degenerative disc disease), lumbar 04/26/2015   Mild levoscoliosis, right anterior listhesis of L4 and L5.  DDD at every level.  Facet arthropathy at multiple levels.   Glaucoma    both eyes, mild opened angle   History of colon polyps    History of frequent urinary tract infections    klebs- pansensitive. c. 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 Shyrl and Dr Red   Syncope  05/17/2017   Thyroid  disease    Urinary incontinence    Vertigo    had been prescribed antivert   Vitamin D  deficiency      Current Medications: Current Meds  Medication Sig   acetaminophen  (TYLENOL ) 500 MG tablet Take 500 mg by mouth 2 (two) times daily.   albuterol  (VENTOLIN  HFA) 108 (90 Base) MCG/ACT inhaler Inhale 2 puffs into the lungs every 6 (six) hours as needed for wheezing or shortness of breath.   aspirin  81 MG chewable tablet Chew 1 tablet (81 mg total) by mouth daily.   Calcium Carb-Cholecalciferol (CALCIUM 600 + D PO) Take 1 tablet by mouth daily.   cycloSPORINE  (RESTASIS ) 0.05 % ophthalmic emulsion Place 1 drop into both eyes 2 (two) times daily.   diclofenac Sodium (VOLTAREN) 1 % GEL Apply 1 Application topically 2 (two) times daily.   fluocinonide  ointment (LIDEX ) 0.05 % Apply  1 Application topically 2 (two) times daily.   leflunomide  (ARAVA ) 10 MG tablet Take 10 mg by mouth daily.   levocetirizine (XYZAL) 5 MG tablet Take 5 mg by mouth daily as needed for allergies.   levothyroxine  (SYNTHROID ) 50 MCG tablet Take 1 tablet (50 mcg total) by mouth daily before breakfast.   mirabegron  ER (MYRBETRIQ ) 25 MG TB24 tablet Take 1 tablet (25 mg total) by mouth daily.   montelukast  (SINGULAIR ) 10 MG tablet Take 1 tablet (10 mg total) by mouth at bedtime.   Multiple Vitamins-Minerals (PRESERVISION AREDS 2+MULTI VIT) CAPS Take 1 tablet by mouth daily.   predniSONE  (DELTASONE ) 5 MG tablet Take 5 mg by mouth daily as needed (arthritis).   pregabalin  (LYRICA ) 25 MG capsule Take 1 capsule (25 mg total) by mouth at bedtime.   Probiotic Product (PROBIOTIC PO) Take 1 capsule by mouth daily.   Propylene Glycol, PF, (SYSTANE COMPLETE PF) 0.6 % SOLN Place 1 drop into both eyes 2 (two) times daily as needed (dry eyes).      ROS:   Please see the history of present illness.    All other systems reviewed and are negative.  EKGs       Risk Assessment/Calculations:           Physical Exam:    VS:  BP (!) 164/90 (BP Location: Left Arm, Patient Position: Sitting, Cuff Size: Normal)   Pulse 66   Ht 5' (1.524 m)   Wt 115 lb 3.2 oz (52.3 kg)   SpO2 96%   BMI 22.50 kg/m     Wt Readings from Last 3 Encounters:  08/22/23 115 lb 3.2 oz (52.3 kg)  08/15/23 116 lb (52.6 kg)  08/04/23 117 lb 11.6 oz (53.4 kg)     GEN: Well nourished, well developed in no acute distress NECK: No JVD CARDIAC: RRR, soft murmur over LUSB. No rubs, gallops RESPIRATORY:  Clear to auscultation without rales, wheezing or rhonchi  ABDOMEN: Soft, non-tender, non-distended EXTREMITIES:  No edema; No deformity.   ASSESSMENT:    1. S/P TAVR (transcatheter aortic valve replacement)   2. White coat syndrome with high blood pressure but without hypertension   3. LBBB (left bundle branch block)       PLAN:    In order of problems listed above:  Severe AS s/p TAVR:  -- Echo today shows EF 50-55%, severe MAC with mild-mod MR with mild MS, normally functioning TAVR with a mean gradient of 5.8 mm hg and no PVL.  -- NYHA II with a mild improvement since TAVR. -- SBE prophylaxis discussed;  she has azithromycin  due to a PCN allergy.  -- Continue Aspirin  81mg  daily. -- I will see back for 1 year echo and OV.  White coat HTN:  -- BP runs normal at home.   New LBBB: -- Noted on ECG after TAVR.  -- Will continue to monitor.   Medication Adjustments/Labs and Tests Ordered: Current medicines are reviewed at length with the patient today.  Concerns regarding medicines are outlined above.  Orders Placed This Encounter  Procedures   ECHOCARDIOGRAM COMPLETE   No orders of the defined types were placed in this encounter.   Patient Instructions  Medication Instructions:  Your physician recommends that you continue on your current medications as directed. Please refer to the Current Medication list given to you today.  *If you need a refill on your cardiac medications before your next appointment, please call your pharmacy*  Lab Work: NONE If you have labs (blood work) drawn today and your tests are completely normal, you will receive your results only by: MyChart Message (if you have MyChart) OR A paper copy in the mail If you have any lab test that is abnormal or we need to change your treatment, we will call you to review the results.  Testing/Procedures: Your physician has requested that you have an echocardiogram. Echocardiography is a painless test that uses sound waves to create images of your heart. It provides your doctor with information about the size and shape of your heart and how well your heart's chambers and valves are working. This procedure takes approximately one hour. There are no restrictions for this procedure. Please do NOT wear cologne, perfume, aftershave,  or lotions (deodorant is allowed). Please arrive 15 minutes prior to your appointment time.  Please note: We ask at that you not bring children with you during ultrasound (echo/ vascular) testing. Due to room size and safety concerns, children are not allowed in the ultrasound rooms during exams. Our front office staff cannot provide observation of children in our lobby area while testing is being conducted. An adult accompanying a patient to their appointment will only be allowed in the ultrasound room at the discretion of the ultrasound technician under special circumstances. We apologize for any inconvenience.   Follow-Up: At [facility], you and your health needs are our priority.  As part of our continuing mission to provide you with exceptional heart care, our providers are all part of one team.  This team includes your primary Cardiologist (physician) and Advanced Practice Providers or APPs (Physician Assistants and Nurse Practitioners) who all work together to provide you with the care you need, when you need it.  Your next appointment:   KEEP SCHEDULED FOLLOW-UP  We recommend signing up for the patient portal called MyChart.  Sign up information is provided on this After Visit Summary.  MyChart is used to connect with patients for Virtual Visits (Telemedicine).  Patients are able to view lab/test results, encounter notes, upcoming appointments, etc.  Non-urgent messages can be sent to your provider as well.   To learn more about what you can do with MyChart, go to [email or web address].          Signed, Lamarr Hummer, PA-C  08/22/2023 2:00 PM    [facility]

## 2023-08-22 ENCOUNTER — Ambulatory Visit (HOSPITAL_COMMUNITY)

## 2023-08-22 ENCOUNTER — Ambulatory Visit (HOSPITAL_COMMUNITY)
Admission: RE | Admit: 2023-08-22 | Discharge: 2023-08-22 | Disposition: A | Discharge status: Home / Self Care | Source: Ambulatory Visit | Attending: Cardiovascular Disease | Admitting: Cardiovascular Disease

## 2023-08-22 ENCOUNTER — Ambulatory Visit: Attending: Physician Assistant | Admitting: Physician Assistant

## 2023-08-22 ENCOUNTER — Encounter: Payer: Self-pay | Admitting: Physician Assistant

## 2023-08-22 ENCOUNTER — Ambulatory Visit: Payer: Self-pay | Admitting: Physician Assistant

## 2023-08-22 VITALS — BP 164/90 | HR 66 | Ht 60.0 in | Wt 115.2 lb

## 2023-08-22 DIAGNOSIS — I447 Left bundle-branch block, unspecified: Secondary | ICD-10-CM | POA: Diagnosis not present

## 2023-08-22 DIAGNOSIS — I10 Essential (primary) hypertension: Secondary | ICD-10-CM

## 2023-08-22 DIAGNOSIS — Z952 Presence of prosthetic heart valve: Secondary | ICD-10-CM | POA: Diagnosis not present

## 2023-08-22 DIAGNOSIS — R030 Elevated blood-pressure reading, without diagnosis of hypertension: Secondary | ICD-10-CM

## 2023-08-22 LAB — ECHOCARDIOGRAM COMPLETE
AR max vel: 1.62 cm2
AV Area VTI: 1.54 cm2
AV Area mean vel: 1.6 cm2
AV Mean grad: 5.8 mmHg
AV Peak grad: 10.8 mmHg
Ao pk vel: 1.64 m/s
Area-P 1/2: 2.3 cm2
MV M vel: 6.01 m/s
MV Peak grad: 144.2 mmHg
MV VTI: 1 cm2
P 1/2 time: 117.6 ms
Radius: 0.5 cm
S' Lateral: 3.3 cm

## 2023-08-22 NOTE — Patient Instructions (Signed)
 Medication Instructions:  Your physician recommends that you continue on your current medications as directed. Please refer to the Current Medication list given to you today.  *If you need a refill on your cardiac medications before your next appointment, please call your pharmacy*  Lab Work: NONE If you have labs (blood work) drawn today and your tests are completely normal, you will receive your results only by: MyChart Message (if you have MyChart) OR A paper copy in the mail If you have any lab test that is abnormal or we need to change your treatment, we will call you to review the results.  Testing/Procedures: Your physician has requested that you have an echocardiogram. Echocardiography is a painless test that uses sound waves to create images of your heart. It provides your doctor with information about the size and shape of your heart and how well your heart's chambers and valves are working. This procedure takes approximately one hour. There are no restrictions for this procedure. Please do NOT wear cologne, perfume, aftershave, or lotions (deodorant is allowed). Please arrive 15 minutes prior to your appointment time.  Please note: We ask at that you not bring children with you during ultrasound (echo/ vascular) testing. Due to room size and safety concerns, children are not allowed in the ultrasound rooms during exams. Our front office staff cannot provide observation of children in our lobby area while testing is being conducted. An adult accompanying a patient to their appointment will only be allowed in the ultrasound room at the discretion of the ultrasound technician under special circumstances. We apologize for any inconvenience.   Follow-Up: At [facility], you and your health needs are our priority.  As part of our continuing mission to provide you with exceptional heart care, our providers are all part of one team.  This team includes your primary Cardiologist  (physician) and Advanced Practice Providers or APPs (Physician Assistants and Nurse Practitioners) who all work together to provide you with the care you need, when you need it.  Your next appointment:   KEEP SCHEDULED FOLLOW-UP  We recommend signing up for the patient portal called MyChart.  Sign up information is provided on this After Visit Summary.  MyChart is used to connect with patients for Virtual Visits (Telemedicine).  Patients are able to view lab/test results, encounter notes, upcoming appointments, etc.  Non-urgent messages can be sent to your provider as well.   To learn more about what you can do with MyChart, go to [email or web address].

## 2023-08-24 ENCOUNTER — Ambulatory Visit (HOSPITAL_COMMUNITY)

## 2023-08-29 ENCOUNTER — Ambulatory Visit (HOSPITAL_COMMUNITY)

## 2023-08-31 ENCOUNTER — Ambulatory Visit (HOSPITAL_COMMUNITY)

## 2023-09-02 ENCOUNTER — Ambulatory Visit (HOSPITAL_COMMUNITY)

## 2023-09-05 ENCOUNTER — Ambulatory Visit (HOSPITAL_COMMUNITY)

## 2023-09-07 ENCOUNTER — Ambulatory Visit (HOSPITAL_COMMUNITY)

## 2023-09-09 ENCOUNTER — Ambulatory Visit (HOSPITAL_COMMUNITY)

## 2023-09-12 ENCOUNTER — Ambulatory Visit (HOSPITAL_COMMUNITY)

## 2023-09-14 ENCOUNTER — Ambulatory Visit (HOSPITAL_COMMUNITY)

## 2023-09-16 ENCOUNTER — Ambulatory Visit (HOSPITAL_COMMUNITY)

## 2023-09-19 ENCOUNTER — Ambulatory Visit (HOSPITAL_COMMUNITY)

## 2023-09-19 NOTE — Progress Notes (Signed)
 [facility] - Clinic Note  09/23/2023    CHIEF COMPLAINT Patient presents for Retina Follow Up   HISTORY OF PRESENT ILLNESS: Margaret [name] is a 89 y.o. female who presents to the clinic today for:   HPI     Retina Follow Up   Patient presents with  Wet AMD.  In right eye.  This started 6 years ago.  Severity is moderate.  Duration of 11 months.  Since onset it is gradually improving.  I, the attending physician,  performed the HPI with the patient and updated documentation appropriately.        Comments   Pt states her near vision has been giving her more trouble and she relies on her daughter to read small print. Pt has a hair like floater in the right eye that she mostly notices in the bathroom, it does not seem to be changing. Pt denies FOL/pain. Pt is planning to update her glasses soon. Pt is using ats prn and feels eyes are scratchy.      Last edited by Valdemar Rogue, MD on 09/28/2023  9:53 PM.    Pt states her vision depends on the moment, it comes and goes, she has a new hair like floater that won't go away   Referring physician: Catherine Fuller A, DO [address] [place],  [place] [address]  HISTORICAL INFORMATION:  Selected notes from the MEDICAL RECORD NUMBER Self referral for macular degeneration LEE: 03.21.19 (Dr. Susie in [place], [place]) [BCVA: OD: 20/40 OS: 20/40] Ocular Hx-POAG, non-exu ARMD, DES, pseudo, YAG PMH-astham, arthritis,    CURRENT MEDICATIONS: Current Outpatient Medications (Ophthalmic Drugs)  Medication Sig   cycloSPORINE  (RESTASIS ) 0.05 % ophthalmic emulsion Place 1 drop into both eyes 2 (two) times daily.   Propylene Glycol, PF, (SYSTANE COMPLETE PF) 0.6 % SOLN Place 1 drop into both eyes 2 (two) times daily as needed (dry eyes).   No current facility-administered medications for this visit. (Ophthalmic Drugs)   Current Outpatient Medications (Other)  Medication Sig   acetaminophen  (TYLENOL ) 500 MG tablet Take 500 mg by mouth 2  (two) times daily.   albuterol  (VENTOLIN  HFA) 108 (90 Base) MCG/ACT inhaler Inhale 2 puffs into the lungs every 6 (six) hours as needed for wheezing or shortness of breath.   aspirin  81 MG chewable tablet Chew 1 tablet (81 mg total) by mouth daily.   Calcium Carb-Cholecalciferol (CALCIUM 600 + D PO) Take 1 tablet by mouth daily.   diclofenac Sodium (VOLTAREN) 1 % GEL Apply 1 Application topically 2 (two) times daily.   fluocinonide  ointment (LIDEX ) 0.05 % Apply 1 Application topically 2 (two) times daily.   leflunomide  (ARAVA ) 10 MG tablet Take 10 mg by mouth daily.   levocetirizine (XYZAL) 5 MG tablet Take 5 mg by mouth daily as needed for allergies.   levothyroxine  (SYNTHROID ) 50 MCG tablet Take 1 tablet (50 mcg total) by mouth daily before breakfast.   mirabegron  ER (MYRBETRIQ ) 25 MG TB24 tablet Take 1 tablet (25 mg total) by mouth daily.   montelukast  (SINGULAIR ) 10 MG tablet Take 1 tablet (10 mg total) by mouth at bedtime.   Multiple Vitamins-Minerals (PRESERVISION AREDS 2+MULTI VIT) CAPS Take 1 tablet by mouth daily.   predniSONE  (DELTASONE ) 5 MG tablet Take 5 mg by mouth daily as needed (arthritis).   pregabalin  (LYRICA ) 25 MG capsule Take 1 capsule (25 mg total) by mouth at bedtime.   Probiotic Product (PROBIOTIC PO) Take 1 capsule by mouth daily.  No current facility-administered medications for this visit. (Other)   REVIEW OF SYSTEMS: ROS   Positive for: Musculoskeletal, Endocrine, Eyes Negative for: Constitutional, Gastrointestinal, Neurological, Skin, Genitourinary, HENT, Cardiovascular, Respiratory, Psychiatric, Allergic/Imm, Heme/Lymph Last edited by Elnor Avelina RAMAN, COT on 09/23/2023 12:40 PM.     ALLERGIES Allergies  Allergen Reactions   Augmentin  [Amoxicillin -Pot Clavulanate] Swelling and Rash    Swelling in face   Lactose Intolerance (Gi) Diarrhea    bloating   Penicillin G Swelling    swelling of face   PAST MEDICAL HISTORY Past Medical History:  Diagnosis Date    Allergy    Asthma    Carotid artery stenosis 10/17/2012   stable report in 2018- moderate on right, minimal on left   Carpal tunnel syndrome 2018   left.   Cervical radiculopathy 05/16/2015   DDD C4-C5, C5-C6, and C6-C7.  Facet arthropathy throughout cervical spine.  Normal foraminal narrowing secondary to uncovertebral  arthropathy is seen bilaterally at C3-C4 and C5/C6   DDD (degenerative disc disease), lumbar 04/26/2015   Mild levoscoliosis, right anterior listhesis of L4 and L5.  DDD at every level.  Facet arthropathy at multiple levels.   Glaucoma    both eyes, mild opened angle   History of colon polyps    History of frequent urinary tract infections    klebs- pansensitive. c. 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 Shyrl and Dr Wendel   Syncope 05/17/2017   Thyroid  disease    Urinary incontinence    Vertigo    had been prescribed antivert   Vitamin D  deficiency    Past Surgical History:  Procedure Laterality Date   ABDOMINAL AORTOGRAM N/A 04/26/2023   Procedure: ABDOMINAL AORTOGRAM;  Surgeon: Wendel Lurena POUR, MD;  Location: [facility];  Service: Cardiovascular;  Laterality: N/A;   CARPAL TUNNEL RELEASE Bilateral 2019   CATARACT EXTRACTION Bilateral 1997   COLONOSCOPY  2010   INTRAOPERATIVE TRANSTHORACIC ECHOCARDIOGRAM N/A 07/12/2023   Procedure: ECHOCARDIOGRAM, TRANSTHORACIC;  Surgeon: Wendel Lurena POUR, MD;   Location: [facility];  Service: Cardiovascular;  Laterality: N/A;   NASAL SEPTUM SURGERY  2007   RIGHT HEART CATH AND CORONARY ANGIOGRAPHY N/A 04/26/2023   Procedure: RIGHT HEART CATH AND CORONARY ANGIOGRAPHY;  Surgeon: Wendel Lurena POUR, MD;  Location: [facility];  Service: Cardiovascular;  Laterality: N/A;   WRIST FRACTURE SURGERY Right 1996   FAMILY HISTORY Family History  Problem Relation Age of Onset   Arthritis Mother    COPD Mother    Prostate cancer Father    Arthritis Father    Hearing loss Father    Heart disease Father    Heart attack Father    Stroke Maternal Grandmother    Liver cancer Sister    Arthritis Brother  Prostate cancer Brother    COPD Brother    Heart disease Brother    Breast cancer Daughter    Blindness Maternal Grandfather    Stroke Maternal Grandfather    COPD Paternal Grandmother    Breast cancer Paternal Grandmother    SOCIAL HISTORY Social History   Tobacco Use   Smoking status: Former    Current packs/day: 0.00    Average packs/day: 0.5 packs/day for 13.0 years (6.5 ttl pk-yrs)    Types: Cigarettes    Start date: 63    Quit date: 1965    Years since quitting: 60.6   Smokeless tobacco: Never  Vaping Use   Vaping status: Never Used  Substance Use Topics   Alcohol  use: Not Currently    Alcohol /week: 5.0 standard drinks of alcohol     Types: 5 Standard drinks or equivalent per week   Drug use: Never       OPHTHALMIC EXAM:  Base Eye Exam     Visual Acuity (Snellen - Linear)       Right Left   Dist cc 20/30 +1 20/30 +2   Dist ph cc NI 20/25 -2    Correction: Glasses         Tonometry (Tonopen, 12:46 PM)       Right Left   Pressure 11 12         Pupils       Pupils Dark Light Shape React APD   Right PERRL 3 2 Round Brisk None   Left PERRL 3 2 Round Brisk None         Visual Fields       Left Right    Full Full         Extraocular Movement       Right Left    Full, Ortho Full, Ortho          Neuro/Psych     Oriented x3: Yes   Mood/Affect: Normal         Dilation     Both eyes: 1.0% Mydriacyl, 2.5% Phenylephrine @ 12:46 PM           Slit Lamp and Fundus Exam     External Exam       Right Left   External Normal Normal         Slit Lamp Exam       Right Left   Lids/Lashes Dermatochalasis - upper lid, Telangiectasia Dermatochalasis - upper lid, mild Meibomian gland dysfunction   Conjunctiva/Sclera Temporal Pinguecula White and quiet   Cornea Arcus, 1+ Punctate epithelial erosions, mild tear film debris, fine endo pigment Arcus, 1-2+ inferior Punctate epithelial erosions, mild tear film debris, fine endo pigment   Anterior Chamber Deep and quiet, narrow temporal angle Deep and quiet   Iris Round and dilated Round and dilated, pigmented mass at 0200 and 0730 behind iris, anterior to IOL -- ?regression of 0200 lesion   Lens PC IOL in good position with open PC PC IOL in good position with open PC   Anterior Vitreous Vitreous syneresis Vitreous syneresis, Posterior vitreous detachment         Fundus Exam       Right Left   Disc 360 Peripapillary atrophy, Sharp rim, 2+ Pallor sharp rim, temporal PPA, mild Pallor   C/D Ratio 0.3 0.3   Macula Flat, Blunted foveal reflex, drusen, RPE mottling and clumping, trace cystic changes -- improved, no heme, early RPE atrophy Flat, Blunted foveal reflex, drusen, RPE mottling,  clumping and early atrophy, no heme or edema   Vessels attenuated, Tortuous attenuated, Tortuous   Periphery Attached, scattered Reticular degeneration, pigmented cystoid degeneration temporally, No heme Attached, scattered Reticular degeneration, No heme           Refraction     Wearing Rx       Sphere Cylinder Axis Add   Right -0.75 +2.25 175 +2.75   Left -0.25 +1.25 165 +2.75           IMAGING AND PROCEDURES  Imaging and Procedures for @TODAY @  OCT, Retina - OU - Both Eyes       Right Eye Quality was good. Central  Foveal Thickness: 274. Progression has improved. Findings include normal foveal contour, no IRF, no SRF, retinal drusen , intraretinal hyper-reflective material, epiretinal membrane, outer retinal atrophy (Trace cystic changes temporal fovea -- improved, Partial PVD; no fluid, interval release of partial PVD).   Left Eye Quality was good. Central Foveal Thickness: 287. Progression has been stable. Findings include normal foveal contour, no IRF, no SRF, retinal drusen , pigment epithelial detachment, outer retinal atrophy (Trace ERM, Partial PVD).   Notes *Images captured and stored on drive  Diagnosis / Impression:  OD: Exudative ARMD - Trace cystic changes temporal fovea -- improved, no fluid; interval release of partial PVD OS: Nonexudative ARMD OS; partial PVD  Clinical management:  See below  Abbreviations: NFP - Normal foveal profile. CME - cystoid macular edema. PED - pigment epithelial detachment. IRF - intraretinal fluid. SRF - subretinal fluid. EZ - ellipsoid zone. ERM - epiretinal membrane. ORA - outer retinal atrophy. ORT - outer retinal tubulation. SRHM - subretinal hyper-reflective material       Intravitreal Injection, Pharmacologic Agent - OD - Right Eye       Time Out 09/23/2023. 1:20 PM. Confirmed correct patient, procedure, site, and patient consented.   Anesthesia Topical anesthesia was used. Anesthetic medications included Lidocaine  2%, Proparacaine 0.5%.   Procedure Preparation included 5% betadine to ocular surface, eyelid speculum. A supplied (32g) needle was used.   Injection: 6 mg faricimab -svoa 6 MG/0.05ML   Route: Intravitreal, Site: Right Eye   NDC: 49757-903-93, Lot: [id number], Expiration date: 09/21/2024, Waste: 0 mL   Post-op Post injection exam found visual acuity of at least counting fingers. The patient tolerated the procedure well. There were no complications. The patient received written and verbal post procedure care education. Post injection  medications were not given.            ASSESSMENT/PLAN:    ICD-10-CM   1. Exudative age-related macular degeneration of right eye with active choroidal neovascularization (HCC)  H35.3211 OCT, Retina - OU - Both Eyes    Intravitreal Injection, Pharmacologic Agent - OD - Right Eye    faricimab -svoa (VABYSMO ) 6mg /0.71mL intravitreal injection    2. Posterior vitreous detachment of right eye  H43.811     3. 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 Long-term use of Plaquenil   Z79.899     6. Pseudophakia of both eyes  Z96.1     7. Primary open angle glaucoma (POAG) of both eyes, mild stage  H40.1131     8. Dry eyes  H04.123      1. Exudative age related macular degeneration, OD  - conversion from nonexudative ARMD noted on 9.23.22 visit  - s/p IVA OD #1 (09.23.22), #2 (10.21.22), #3 (11.18.22), #4 (12.30.22), #  5 (02.13.23), #6 (03.24.23), #7 (05.08.23), #8 (06.09.23) -- IVA resistance  - s/p IVE OD #1 (07.24.23), #2 (08.25.23), #3 (09.29.23), #4 (10.27.23) -- IVE resistance  - s/p IVV OD #1 (12.01.23), #2 (01.05.34), #3 (02.02.24), #4 (03.01.24), #5 (03.29.24), #6 (04.26.24), #7 (05.24.24), #8 (06.28.24) #9 (08.16.24), #10 (10.18.24), #11 (12.20.24), #12 (02.27.25), #13 (05.16.25) - BCVA OD stable at 20/30  - OCT shows OD: Trace cystic changes temporal fovea -- improved, no fluid; interval release of partial PVD at 11 weeks - recommend IVV OD #14 today, 08.01.25 w/ f/u extended to 12 wks - pt wishes to be treated with IVV - RBA of procedure discussed, questions answered - Vabysmo  informed consent obtained and signed, 12.01.23 - see procedure note - f/u 12 weeks -- DFE/OCT, possible injection   2. Age related macular degeneration, non-exudative OS  - intermediate stage-no change from previous visit - The incidence, anatomy, and pathology of dry AMD, risk of progression, and the AREDS and AREDS 2 study  including smoking risks discussed with patient.   - recommend Amsler grid monitoring  3. PVD / vitreous syneresis OD  - Discussed findings and prognosis  - No RT or RD on 360 scleral depressed exam  - Reviewed s/s of RT/RD  - Strict return precautions for any such RT/RD signs/symptoms  - f/u in 3-4 wks -- DFE/OCT   4. Iris/ciliary body mass OS - pigmented lesion behind iris but anterior to PCIOL at 0730 and 0300 - 0730 lesion spans 0630 to 0830 behind dilated iris and larger than 0300 lesion  - patient asymptomatic - pt saw Dr. Mort, [facility] on 11.01.22 who dx her with iris margin cyst  5. Plaquenil  use for RA  - baseline testing done 05.20.20 -- started plaquenil  early May 2020 for RA - HVF 10-2 showed non-specific defects OU -- essentially normal baseline test - OCT shows no obvious plaquenil -related changes, but interval development of exudative ARMD OD as above - currently taking 200mg  / day per Dr. Carmine instructions --> 3.29 mg/kg body wt per day  - po prednisone , currently as needed which is about once a week - and is also now on 10mg  po MTX weekly + folic acid  - the American Academy of Ophthalmology recommends dosing 5mg /kg per day to reduce risk of retinal toxicity  - recommend consideration of using alternate medication for RA especially given co-morbidity of age-related macular degeneration OU  - managed by Dr[facility] Leni at [facility]  6. Pseudophakia OU - s/p CE/IOL w/ ?canaloplasty OU by Dr[facility] Laurey at [facility]  - doing well  - continue to monitor  7. History of Glaucoma   - not currently on gtts -- IOP 16 OU - ?underwent canaloplasty in conjunction with CEIOL at [facility]   - referred to and now under the expert care of Dr. Lelon -- initial Visit/consult 10.3.2019  8. Dry Eye Syndrome  - use AT's OU as needed  Ophthalmic Meds Ordered this visit:  Meds ordered this encounter  Medications   faricimab -svoa  (VABYSMO ) 6mg /0.76mL intravitreal injection     This document serves as a record of services personally performed by Redell JUDITHANN Hans, MD, PhD. It was created on their behalf by Avelina Pereyra, COA an ophthalmic technician. The creation of this record is the provider's dictation and/or activities during the visit.   Electronically signed by: Avelina GORMAN Pereyra, COT  09/28/23  9:57 PM   This document serves as a record of services personally performed  by Redell JUDITHANN Hans, MD, PhD. It was created on their behalf by Alan PARAS. Delores, OA an ophthalmic technician. The creation of this record is the provider's dictation and/or activities during the visit.    Electronically signed by: Alan PARAS. Delores, OA 09/28/23 9:57 PM  Redell JUDITHANN Hans, M.D., Ph.D. Diseases & Surgery of the Retina and Vitreous [facility]  I have reviewed the above documentation for accuracy and completeness, and I agree with the above. Redell JUDITHANN Hans, M.D., Ph.D. 09/28/23 9:57 PM   Abbreviations: M myopia (nearsighted); A astigmatism; H hyperopia (farsighted); P presbyopia; Mrx spectacle prescription;  CTL contact lenses; OD right eye; OS left eye; OU both eyes  XT exotropia; ET esotropia; PEK punctate epithelial keratitis; PEE punctate epithelial erosions; DES dry eye syndrome; MGD meibomian gland dysfunction; ATs artificial tears; PFAT's preservative free artificial tears; NSC nuclear sclerotic cataract; PSC posterior subcapsular cataract; ERM epi-retinal membrane; PVD posterior vitreous detachment; RD retinal detachment; DM diabetes mellitus; DR diabetic retinopathy; NPDR non-proliferative diabetic retinopathy; PDR proliferative diabetic retinopathy; CSME clinically significant macular edema; DME diabetic macular edema; dbh dot blot hemorrhages; CWS cotton wool spot; POAG primary open angle glaucoma; C/D cup-to-disc ratio; HVF humphrey visual field; GVF goldmann visual field; OCT optical coherence tomography; IOP  intraocular pressure; BRVO Branch retinal vein occlusion; CRVO central retinal vein occlusion; CRAO central retinal artery occlusion; BRAO branch retinal artery occlusion; RT retinal tear; SB scleral buckle; PPV pars plana vitrectomy; VH Vitreous hemorrhage; PRP panretinal laser photocoagulation; IVK intravitreal kenalog; VMT vitreomacular traction; MH Macular hole;  NVD neovascularization of the disc; NVE neovascularization elsewhere; AREDS age related eye disease study; ARMD age related macular degeneration; POAG primary open angle glaucoma; EBMD epithelial/anterior basement membrane dystrophy; ACIOL anterior chamber intraocular lens; IOL intraocular lens; PCIOL posterior chamber intraocular lens; Phaco/IOL phacoemulsification with intraocular lens placement; PRK photorefractive keratectomy; LASIK laser assisted in situ keratomileusis; HTN hypertension; DM diabetes mellitus; COPD chronic obstructive pulmonary disease

## 2023-09-21 ENCOUNTER — Ambulatory Visit (HOSPITAL_COMMUNITY)

## 2023-09-23 ENCOUNTER — Ambulatory Visit (HOSPITAL_COMMUNITY)

## 2023-09-23 ENCOUNTER — Ambulatory Visit (INDEPENDENT_AMBULATORY_CARE_PROVIDER_SITE_OTHER): Admitting: Ophthalmology

## 2023-09-23 ENCOUNTER — Encounter (INDEPENDENT_AMBULATORY_CARE_PROVIDER_SITE_OTHER): Payer: Self-pay | Admitting: Ophthalmology

## 2023-09-23 DIAGNOSIS — H04123 Dry eye syndrome of bilateral lacrimal glands: Secondary | ICD-10-CM

## 2023-09-23 DIAGNOSIS — H353122 Nonexudative age-related macular degeneration, left eye, intermediate dry stage: Secondary | ICD-10-CM | POA: Diagnosis not present

## 2023-09-23 DIAGNOSIS — D4989 Neoplasm of unspecified behavior of other specified sites: Secondary | ICD-10-CM

## 2023-09-23 DIAGNOSIS — H353211 Exudative age-related macular degeneration, right eye, with active choroidal neovascularization: Secondary | ICD-10-CM | POA: Diagnosis not present

## 2023-09-23 DIAGNOSIS — Z79899 Other long term (current) drug therapy: Secondary | ICD-10-CM

## 2023-09-23 DIAGNOSIS — H401131 Primary open-angle glaucoma, bilateral, mild stage: Secondary | ICD-10-CM

## 2023-09-23 DIAGNOSIS — H43811 Vitreous degeneration, right eye: Secondary | ICD-10-CM | POA: Diagnosis not present

## 2023-09-23 DIAGNOSIS — Z961 Presence of intraocular lens: Secondary | ICD-10-CM

## 2023-09-26 ENCOUNTER — Ambulatory Visit (HOSPITAL_COMMUNITY)

## 2023-09-28 ENCOUNTER — Encounter (INDEPENDENT_AMBULATORY_CARE_PROVIDER_SITE_OTHER): Payer: Self-pay | Admitting: Ophthalmology

## 2023-09-28 ENCOUNTER — Ambulatory Visit (HOSPITAL_COMMUNITY)

## 2023-09-28 MED ORDER — FARICIMAB-SVOA 6 MG/0.05ML IZ SOSY
6.0000 mg | PREFILLED_SYRINGE | INTRAVITREAL | Status: AC | PRN
  Administered 2023-09-28: 6 mg via INTRAVITREAL

## 2023-09-30 ENCOUNTER — Ambulatory Visit (HOSPITAL_COMMUNITY)

## 2023-10-03 ENCOUNTER — Ambulatory Visit (HOSPITAL_COMMUNITY)

## 2023-10-05 ENCOUNTER — Ambulatory Visit (HOSPITAL_COMMUNITY)

## 2023-10-07 ENCOUNTER — Ambulatory Visit (HOSPITAL_COMMUNITY)

## 2023-10-10 ENCOUNTER — Ambulatory Visit (HOSPITAL_COMMUNITY)

## 2023-10-12 ENCOUNTER — Ambulatory Visit (HOSPITAL_COMMUNITY)

## 2023-10-14 ENCOUNTER — Ambulatory Visit (HOSPITAL_COMMUNITY)

## 2023-10-17 ENCOUNTER — Ambulatory Visit (HOSPITAL_COMMUNITY)

## 2023-10-19 ENCOUNTER — Ambulatory Visit (HOSPITAL_COMMUNITY)

## 2023-10-21 ENCOUNTER — Ambulatory Visit (HOSPITAL_COMMUNITY)

## 2023-10-26 ENCOUNTER — Ambulatory Visit (HOSPITAL_COMMUNITY)

## 2023-11-01 ENCOUNTER — Ambulatory Visit: Payer: Self-pay

## 2023-11-01 NOTE — Telephone Encounter (Signed)
 FYI Only or Action Required?: FYI only for provider.  Patient was last seen in primary care on 08/15/2023 by Catherine Fuller A, DO.  Called Nurse Triage reporting Leg Pain.  Symptoms began several years ago.  Interventions attempted: Rest, hydration, or home remedies.  Symptoms are: gradually worsening.  Triage Disposition: See PCP When Office is Open (Within 3 Days)  Patient/caregiver understands and will follow disposition?: Yes                             Copied from CRM [id number]. Topic: Clinical - Red Word Triage >> Nov 01, 2023  1:13 PM Aisha D wrote: Red Word that prompted transfer to Nurse Triage: \worsening leg aches  Pt stated that she is experiencing leg aches which are starting to worsen and would like to schedule an appt with provider.    ----------------------------------------------------------------------- From previous Reason for Contact - Scheduling: Patient/patient representative is calling to schedule an appointment. Refer to attachments for appointment information. Reason for Disposition  [1] MODERATE pain (e.g., interferes with normal activities, limping) AND [2] present > 3 days  Answer Assessment - Initial Assessment Questions 1. ONSET: When did the pain start?      On and off for 3 years, worsening within past 3 weeks 2. LOCATION: Where is the pain located?      Bilateral legs from toes to knees 3. PAIN: How bad is the pain?    (Scale 1-10; or mild, moderate, severe)     Describes pain as burning sensation, states pain wakes her up at night, patient unable to rate pain 5. CAUSE: What do you think is causing the leg pain?     Unsure 6. OTHER SYMPTOMS: Do you have any other symptoms? (e.g., chest pain, back pain, breathing difficulty, swelling, rash, fever, numbness, weakness)     Burning sensation in feet bilateral feet, denies swelling, denies redness, denies fever, denies chest pain, denies difficulty  breathing  Protocols used: Leg Pain-A-AH

## 2023-11-02 ENCOUNTER — Encounter: Payer: Self-pay | Admitting: Family Medicine

## 2023-11-02 ENCOUNTER — Ambulatory Visit (INDEPENDENT_AMBULATORY_CARE_PROVIDER_SITE_OTHER): Admitting: Family Medicine

## 2023-11-02 VITALS — BP 134/84 | HR 69 | Temp 97.8°F | Wt 114.4 lb

## 2023-11-02 DIAGNOSIS — D508 Other iron deficiency anemias: Secondary | ICD-10-CM

## 2023-11-02 DIAGNOSIS — Z23 Encounter for immunization: Secondary | ICD-10-CM | POA: Diagnosis not present

## 2023-11-02 DIAGNOSIS — M51362 Other intervertebral disc degeneration, lumbar region with discogenic back pain and lower extremity pain: Secondary | ICD-10-CM | POA: Diagnosis not present

## 2023-11-02 DIAGNOSIS — D509 Iron deficiency anemia, unspecified: Secondary | ICD-10-CM | POA: Diagnosis not present

## 2023-11-02 DIAGNOSIS — M353 Polymyalgia rheumatica: Secondary | ICD-10-CM

## 2023-11-02 LAB — COMPREHENSIVE METABOLIC PANEL WITH GFR
ALT: 17 U/L (ref 0–35)
AST: 23 U/L (ref 0–37)
Albumin: 4.8 g/dL (ref 3.5–5.2)
Alkaline Phosphatase: 63 U/L (ref 39–117)
BUN: 17 mg/dL (ref 6–23)
CO2: 30 meq/L (ref 19–32)
Calcium: 9.6 mg/dL (ref 8.4–10.5)
Chloride: 104 meq/L (ref 96–112)
Creatinine, Ser: 0.66 mg/dL (ref 0.40–1.20)
GFR: 76.26 mL/min (ref >60.00)
Glucose, Bld: 92 mg/dL (ref 70–99)
Potassium: 4.5 meq/L (ref 3.5–5.1)
Sodium: 142 meq/L (ref 135–145)
Total Bilirubin: 0.8 mg/dL (ref 0.2–1.2)
Total Protein: 6.8 g/dL (ref 6.0–8.3)

## 2023-11-02 LAB — CBC WITH DIFFERENTIAL/PLATELET
Basophils Absolute: 0.1 K/uL (ref 0.0–0.1)
Basophils Relative: 1.3 % (ref 0.0–3.0)
Eosinophils Absolute: 0.3 K/uL (ref 0.0–0.7)
Eosinophils Relative: 3.1 % (ref 0.0–5.0)
HCT: 41.7 % (ref 36.0–46.0)
Hemoglobin: 13.9 g/dL (ref 12.0–15.0)
Lymphocytes Relative: 10.7 % — ABNORMAL LOW (ref 12.0–46.0)
Lymphs Abs: 0.9 K/uL (ref 0.7–4.0)
MCHC: 33.4 g/dL (ref 30.0–36.0)
MCV: 89.8 fl (ref 78.0–100.0)
Monocytes Absolute: 1 K/uL (ref 0.1–1.0)
Monocytes Relative: 11.6 % (ref 3.0–12.0)
Neutro Abs: 6.1 K/uL (ref 1.4–7.7)
Neutrophils Relative %: 73.3 % (ref 43.0–77.0)
Platelets: 194 K/uL (ref 150.0–400.0)
RBC: 4.65 Mil/uL (ref 3.87–5.11)
RDW: 13.6 % (ref 11.5–15.5)
WBC: 8.3 K/uL (ref 4.0–10.5)

## 2023-11-02 LAB — IBC + FERRITIN
Ferritin: 65.1 ng/mL (ref 10.0–291.0)
Iron: 127 ug/dL (ref 42–145)
Saturation Ratios: 41.2 % (ref 20.0–50.0)
TIBC: 308 ug/dL (ref 250.0–450.0)
Transferrin: 220 mg/dL (ref 212.0–360.0)

## 2023-11-02 MED ORDER — PREGABALIN 50 MG PO CAPS: 50.0000 mg | ORAL_CAPSULE | Freq: Every day | ORAL | 1 refills | Status: DC

## 2023-11-02 MED ORDER — AMLODIPINE BESYLATE 2.5 MG PO TABS: 2.5000 mg | ORAL_TABLET | Freq: Every day | ORAL | 1 refills | Status: AC

## 2023-11-02 NOTE — Progress Notes (Unsigned)
 Margaret [name] , [birth year], 89 y.o., female MRN: [id number] Patient Care Team    Relationship Specialty Notifications Start End  Catherine Charlies LABOR, DO PCP - General Family Medicine  03/08/19   Lonni Slain, MD PCP - Cardiology Cardiology  04/30/22   Wendel Lurena POUR, MD PCP - Structural Heart Cardiology  07/13/23   Valdemar Rogue, MD Consulting Physician Ophthalmology  03/29/18   Lelon JONELLE Ferrari, MD Consulting Physician Ophthalmology  03/29/18   Tricia Tawni CROME, MD Referring Physician Dermatology  08/02/19   Ishmael Slough, MD Consulting Physician Rheumatology  12/16/22     Chief Complaint  Patient presents with   Leg Cramps    Ongoing worsening leg cramps; both legs. Causes sleep disturbance.      Subjective: Pt presents for an OV for worsening leg pain.   All past medical history, surgical history, allergies, family history, immunizations and social history was obtained from the patient today and updated into the electronic medical record.    Patient presents today with complaints of worsening nocturnal leg discomfort.  She feels sometimes that is cramp-like discomfort and sometimes burning sensation.  She was started on Lyrica  25 mg which initially was helpful, now she feels like her discomfort is worsening.  She had tried Mirapex  in the past and it was not effective.      08/04/2023    3:05 PM 08/04/2023   11:25 AM 05/04/2023   11:40 AM 01/12/2023    3:05 PM 12/16/2022   10:04 AM  Depression screen PHQ 2/9  Decreased Interest 0 0 0 0 0  Down, Depressed, Hopeless 0 0 0 0 0  PHQ - 2 Score 0 0 0 0 0  Altered sleeping 0 0 1 1   Tired, decreased energy 1 1 1 1    Change in appetite 0 0 1 0   Feeling bad or failure about yourself  0 0 0 0   Trouble concentrating 1 1 0 0   Moving slowly or fidgety/restless 1 1 0 0   Suicidal thoughts 0 0 0 0   PHQ-9 Score 3 3 3 2    Difficult doing work/chores Somewhat difficult Somewhat difficult Not difficult at all Not  difficult at all     Allergies  Allergen Reactions   Augmentin  [Amoxicillin -Pot Clavulanate] Swelling and Rash    Swelling in face   Lactose Intolerance (Gi) Diarrhea    bloating   Penicillin G Swelling    swelling of face   Social History   Social History Narrative   Marital status/children/pets: Widowed.  Moved to [place]  from [place]  [facility] area) 2019.  She shares a home with 1 of her daughters.  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   Cervical radiculopathy 05/16/2015   DDD C4-C5, C5-C6, and C6-C7.  Facet arthropathy throughout cervical spine.  Normal foraminal narrowing secondary to uncovertebral  arthropathy is seen bilaterally at C3-C4  and C5/C6   DDD (degenerative disc disease), lumbar 04/26/2015   Mild levoscoliosis, right anterior listhesis of L4 and L5.  DDD at every level.  Facet arthropathy at multiple levels.   Glaucoma    both eyes, mild opened angle   History of colon polyps    History of frequent urinary tract infections    klebs- pansensitive. c. 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 Shyrl and Dr Wendel   Syncope 05/17/2017   Thyroid  disease    Urinary incontinence    Vertigo    had been prescribed antivert   Vitamin D  deficiency    Past Surgical History:  Procedure Laterality Date   ABDOMINAL AORTOGRAM N/A 04/26/2023   Procedure: ABDOMINAL AORTOGRAM;  Surgeon: Wendel Lurena POUR, MD;  Location: [facility];  Service: Cardiovascular;  Laterality: N/A;   CARPAL TUNNEL RELEASE Bilateral 2019   CATARACT EXTRACTION Bilateral 1997   COLONOSCOPY  2010   INTRAOPERATIVE TRANSTHORACIC ECHOCARDIOGRAM N/A 07/12/2023   Procedure: ECHOCARDIOGRAM, TRANSTHORACIC;  Surgeon: Wendel Lurena POUR, MD;  Location: [facility];  Service: Cardiovascular;  Laterality: N/A;   NASAL SEPTUM SURGERY  2007   RIGHT HEART CATH AND CORONARY ANGIOGRAPHY N/A 04/26/2023   Procedure: RIGHT HEART CATH AND CORONARY ANGIOGRAPHY;  Surgeon: Wendel Lurena POUR, MD;  Location: [facility];  Service: Cardiovascular;  Laterality: N/A;   WRIST FRACTURE SURGERY Right 1996   Family History  Problem Relation Age of Onset   Arthritis Mother    COPD Mother    Prostate cancer Father    Arthritis Father    Hearing loss Father    Heart disease Father    Heart attack Father    Stroke Maternal Grandmother    Liver cancer Sister    Arthritis Brother    Prostate cancer Brother    COPD Brother    Heart disease Brother    Breast cancer Daughter    Blindness Maternal Grandfather    Stroke Maternal Grandfather    COPD Paternal Grandmother    Breast cancer Paternal Grandmother    Allergies as of 11/02/2023       Reactions   Augmentin  [amoxicillin -pot Clavulanate] Swelling, Rash   Swelling in face   Lactose Intolerance (gi) Diarrhea   bloating   Penicillin G Swelling   swelling of face        Medication List        Accurate as of November 02, 2023  4:30 PM. If you  have any questions, ask your nurse or doctor.          STOP taking these medications    Systane Complete PF 0.6 % Soln Generic drug: Propylene Glycol (PF) Stopped by: Charlies Bellini       TAKE these medications  acetaminophen  500 MG tablet Commonly known as: TYLENOL  Take 500 mg by mouth 2 (two) times daily.   albuterol  108 (90 Base) MCG/ACT inhaler Commonly known as: VENTOLIN  HFA Inhale 2 puffs into the lungs every 6 (six) hours as needed for wheezing or shortness of breath.   amLODipine  2.5 MG tablet Commonly known as: NORVASC  Take 1 tablet (2.5 mg total) by mouth daily. Started by: Charlies Bellini   aspirin  81 MG chewable tablet Chew 1 tablet (81 mg total) by mouth daily.   CALCIUM 600 + D PO Take 1 tablet by mouth daily.   cycloSPORINE  0.05 % ophthalmic emulsion Commonly known as: RESTASIS  Place 1 drop into both eyes 2 (two) times daily.   diclofenac Sodium 1 % Gel Commonly known as: VOLTAREN Apply 1 Application topically 2 (two) times daily.   fluocinonide  ointment 0.05 % Commonly known as: LIDEX  Apply 1 Application topically 2 (two) times daily.   leflunomide  10 MG tablet Commonly known as: ARAVA  Take 10 mg by mouth daily.   levocetirizine 5 MG tablet Commonly known as: XYZAL Take 5 mg by mouth daily as needed for allergies.   levothyroxine  50 MCG tablet Commonly known as: SYNTHROID  Take 1 tablet (50 mcg total) by mouth daily before breakfast.   mirabegron  ER 25 MG Tb24 tablet Commonly known as: MYRBETRIQ  Take 1 tablet (25 mg total) by mouth daily.   montelukast  10 MG tablet Commonly known as: SINGULAIR  Take 1 tablet (10 mg total) by mouth at bedtime.   predniSONE  5 MG tablet Commonly known as: DELTASONE  Take 5 mg by mouth daily as needed (arthritis).   pregabalin  50 MG capsule Commonly known as: LYRICA  Take 1 capsule (50 mg total) by mouth at bedtime. What changed:  medication strength how much to take Changed by: Charlies Bellini    PreserVision AREDS 2+Multi Vit Caps Take 1 tablet by mouth daily.   PROBIOTIC PO Take 1 capsule by mouth daily.        All past medical history, surgical history, allergies, family history, immunizations andmedications were updated in the EMR today and reviewed under the history and medication portions of their EMR.     ROS: Negative, with the exception of above mentioned in HPI  Objective:  BP 134/84   Pulse 69   Temp 97.8 F (36.6 C)   Wt 114 lb 6.4 oz (51.9 kg)   SpO2 96%   BMI 22.34 kg/m  Body mass index is 22.34 kg/m. Physical Exam Vitals and nursing note reviewed.  Constitutional:      General: She is not in acute distress.    Appearance: Normal appearance. She is normal weight. She is not ill-appearing or toxic-appearing.  HENT:     Head: Normocephalic and atraumatic.  Eyes:     General: No scleral icterus.       Right eye: No discharge.        Left eye: No discharge.     Extraocular Movements: Extraocular movements intact.     Conjunctiva/sclera: Conjunctivae normal.     Pupils: Pupils are equal, round, and reactive to light.  Cardiovascular:     Rate and Rhythm: Normal rate and regular rhythm.  Pulmonary:     Effort: Pulmonary effort is normal.     Breath sounds: Normal breath sounds.  Musculoskeletal:     Right lower leg: No edema.     Left lower leg: No edema.  Skin:    Findings: No rash.  Neurological:     Mental Status: She  is alert and oriented to person, place, and time. Mental status is at baseline.     Motor: No weakness.     Coordination: Coordination normal.     Gait: Gait normal.  Psychiatric:        Mood and Affect: Mood normal.        Behavior: Behavior normal.        Thought Content: Thought content normal.        Judgment: Judgment normal.     No results found. No results found. No results found for this or any previous visit (from the past 24 hours).   Assessment/Plan: Margaret [name] is a 89 y.o. female present for OV for   Nocturnal leg cramps/RLS/PVD Worsening Increase Lyrica  25 mg nightly to 50 mg nightly Failed:  Mirapex . [place]  controlled substance database reviewed 11/02/23 CBC, CMP and iron levels collected Next have could consider baclofen nightly, or other muscle relaxant.  However currently she is describing the discomfort as more burning sensation.  Hypertension: New problem Blood pressure above goal and has been last few visits. Start amlodipine  2.5 mg daily.  Influenza vaccine administered today.  Return in about 4 weeks (around 11/30/2023) for HTN follow up .   Reviewed expectations re: course of current medical issues. Discussed self-management of symptoms. Outlined signs and symptoms indicating need for more acute intervention. Patient verbalized understanding and all questions were answered. Patient received an After-Visit Summary.    Orders Placed This Encounter  Procedures   Flu vaccine HIGH DOSE PF(Fluzone Trivalent)   Comp Met (CMET)   CBC w/Diff   IBC + Ferritin    Meds ordered this encounter  Medications   amLODipine  (NORVASC ) 2.5 MG tablet    Sig: Take 1 tablet (2.5 mg total) by mouth daily.    Dispense:  90 tablet    Refill:  1   pregabalin  (LYRICA ) 50 MG capsule    Sig: Take 1 capsule (50 mg total) by mouth at bedtime.    Dispense:  90 capsule    Refill:  1    Referral Orders  No referral(s) requested today     Note is dictated utilizing voice recognition software. Although note has been proof read prior to signing, occasional typographical errors still can be missed. If any questions arise, please do not hesitate to call for verification.   electronically signed by:  Charlies Bellini, DO  [facility] [place]

## 2023-11-02 NOTE — Patient Instructions (Addendum)
 Good plant sources of iron are lentils, chickpeas, beans, tofu, cashew nuts, pumpkin seeds, kale, dried apricots and figs, raisins, quinoa and fortified breakfast cereal.   Return in about 4 weeks (around 11/30/2023) for HTN follow up .  Start amlodipine  every day in the morning for blood pressure Increase lyrica  (before bed) to 50 mg nightly. The new script will be 50 mg each, your old script is 25 mg each.       Great to see you today.  I have refilled the medication(s) we provide.   If labs were collected or images ordered, we will inform you of  results once we have received them and reviewed. We will contact you either by echart message, or telephone call.  Please give ample time to the testing facility, and our office to run,  receive and review results. Please do not call inquiring of results, even if you can see them in your chart. We will contact you as soon as we are able. If it has been over 1 week since the test was completed, and you have not yet heard from us , then please call us .    - echart message- for normal results that have been seen by the patient already.   - telephone call: abnormal results or if patient has not viewed results in their echart.  If a referral to a specialist was entered for you, please call us  in 2 weeks if you have not heard from the specialist office to schedule.

## 2023-11-03 ENCOUNTER — Ambulatory Visit: Payer: Self-pay | Admitting: Family Medicine

## 2023-11-07 ENCOUNTER — Telehealth (HOSPITAL_BASED_OUTPATIENT_CLINIC_OR_DEPARTMENT_OTHER): Payer: Self-pay | Admitting: Cardiology

## 2023-11-07 NOTE — Telephone Encounter (Signed)
 Daughter Guillermina) stated patient has developed a dermatologic infection and is concerned about next steps.

## 2023-11-07 NOTE — Telephone Encounter (Signed)
 Daughter Guillermina) returned RN's call.

## 2023-11-07 NOTE — Telephone Encounter (Signed)
 Spoke with daughter regarding patient Had skin cancer removed and over the weekend notice an odor.  Appointment today, culture done and started on Doxycycline  100 mg twice a day x 10 days Daughter concerned about the recent valve surgery and wants to make sure not an issue  Will forward to MARLA Hummer, PA for review

## 2023-11-07 NOTE — Telephone Encounter (Signed)
 Left message to call back.

## 2023-11-08 NOTE — Telephone Encounter (Signed)
 Margaret Lamarr SAUNDERS, PA-C to Me     11/08/23  9:14 AM We agree with treatment with doxycycline . Thanks for checking.   Advised daughter, verbalized understanding

## 2023-11-14 ENCOUNTER — Ambulatory Visit

## 2023-11-21 ENCOUNTER — Ambulatory Visit (INDEPENDENT_AMBULATORY_CARE_PROVIDER_SITE_OTHER)

## 2023-11-21 DIAGNOSIS — M810 Age-related osteoporosis without current pathological fracture: Secondary | ICD-10-CM

## 2023-11-21 MED ORDER — DENOSUMAB 60 MG/ML ~~LOC~~ SOSY
60.0000 mg | PREFILLED_SYRINGE | Freq: Once | SUBCUTANEOUS | Status: AC
  Administered 2023-11-21: 60 mg via SUBCUTANEOUS

## 2023-11-21 NOTE — Progress Notes (Signed)
Pt in for Prolia injection per Dr Claiborne Billings.  Injection tolerated well.  Pt advised to schedule for next dose in March

## 2023-11-28 NOTE — Telephone Encounter (Signed)
 Duplicate note

## 2023-11-30 ENCOUNTER — Ambulatory Visit: Admitting: Family Medicine

## 2023-11-30 ENCOUNTER — Ambulatory Visit (INDEPENDENT_AMBULATORY_CARE_PROVIDER_SITE_OTHER): Admitting: Family Medicine

## 2023-11-30 DIAGNOSIS — Z91199 Patient's noncompliance with other medical treatment and regimen due to unspecified reason: Secondary | ICD-10-CM

## 2023-11-30 NOTE — Progress Notes (Signed)
       No-show-patient thought her appointment was had a difficult time

## 2023-12-02 ENCOUNTER — Encounter: Payer: Self-pay | Admitting: Family Medicine

## 2023-12-08 ENCOUNTER — Ambulatory Visit: Admitting: Family Medicine

## 2023-12-08 ENCOUNTER — Encounter: Payer: Self-pay | Admitting: Family Medicine

## 2023-12-08 VITALS — BP 128/76 | HR 71 | Temp 98.0°F | Wt 117.6 lb

## 2023-12-08 DIAGNOSIS — I10 Essential (primary) hypertension: Secondary | ICD-10-CM

## 2023-12-08 NOTE — Patient Instructions (Signed)
 Return in about 20 weeks (around 04/26/2024) for Routine chronic condition follow-up.        Great to see you today.  I have refilled the medication(s) we provide.   If labs were collected or images ordered, we will inform you of  results once we have received them and reviewed. We will contact you either by echart message, or telephone call.  Please give ample time to the testing facility, and our office to run,  receive and review results. Please do not call inquiring of results, even if you can see them in your chart. We will contact you as soon as we are able. If it has been over 1 week since the test was completed, and you have not yet heard from us , then please call us .    - echart message- for normal results that have been seen by the patient already.   - telephone call: abnormal results or if patient has not viewed results in their echart.  If a referral to a specialist was entered for you, please call us  in 2 weeks if you have not heard from the specialist office to schedule.

## 2023-12-08 NOTE — Progress Notes (Signed)
 Margaret [name] , 1930/01/04, 89 y.o., female MRN: [id number] Patient Care Team    Relationship Specialty Notifications Start End  Catherine Charlies LABOR, DO PCP - General Family Medicine  03/08/19   Lonni Slain, MD PCP - Cardiology Cardiology  04/30/22   Wendel Lurena POUR, MD PCP - Structural Heart Cardiology  07/13/23   Valdemar Rogue, MD Consulting Physician Ophthalmology  03/29/18   Lelon JONELLE Ferrari, MD Consulting Physician Ophthalmology  03/29/18   Tricia Tawni CROME, MD Referring Physician Dermatology  08/02/19   Ishmael Slough, MD Consulting Physician Rheumatology  12/16/22     Chief Complaint  Patient presents with   Hypertension     Subjective: Pt presents for an OV for follow-up of: Leg pain and new hypertension All past medical history, surgical history, allergies, family history, immunizations and social history was obtained from the patient today and updated into the electronic medical record.   Leg pain: Patient had reported increase in leg pain last visit, Lyrica  was increased.  Today she reports better since increasing lyrica  dose. Prior note Patient presents today with complaints of worsening nocturnal leg discomfort.  She feels sometimes that is cramp-like discomfort and sometimes burning sensation.  She was started on Lyrica  25 mg which initially was helpful, now she feels like her discomfort is worsening.  She had tried Mirapex  in the past and it was not effective.  Hypertension: Pt reports compliance with amlodipine  2.5 mg , which was started last visit.   Patient denies chest pain, shortness of breath or lower extremity edema.       08/04/2023    3:05 PM 08/04/2023   11:25 AM 05/04/2023   11:40 AM 01/12/2023    3:05 PM 12/16/2022   10:04 AM  Depression screen PHQ 2/9  Decreased Interest 0 0 0 0 0  Down, Depressed, Hopeless 0 0 0 0 0  PHQ - 2 Score 0 0 0 0 0  Altered sleeping 0 0 1 1   Tired, decreased energy 1 1 1 1    Change in appetite 0 0 1 0    Feeling bad or failure about yourself  0 0 0 0   Trouble concentrating 1 1 0 0   Moving slowly or fidgety/restless 1 1 0 0   Suicidal thoughts 0 0 0 0   PHQ-9 Score 3 3 3 2    Difficult doing work/chores Somewhat difficult Somewhat difficult Not difficult at all Not difficult at all     Allergies  Allergen Reactions   Augmentin  [Amoxicillin -Pot Clavulanate] Swelling and Rash    Swelling in face   Lactose Intolerance (Gi) Diarrhea    bloating   Penicillin G Swelling    swelling of face   Social History   Social History Narrative   Marital status/children/pets: Widowed.  Moved to [place]  from [place]  [facility] area) 2019.  She shares a home with 1 of her daughters.  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   Cervical radiculopathy 05/16/2015   DDD C4-C5, C5-C6, and C6-C7.  Facet arthropathy throughout cervical spine.  Normal foraminal narrowing secondary to uncovertebral  arthropathy is seen bilaterally at C3-C4 and C5/C6   DDD (degenerative disc disease), lumbar 04/26/2015   Mild levoscoliosis, right anterior listhesis of L4 and L5.  DDD at every level.  Facet arthropathy at multiple levels.   Glaucoma    both eyes, mild opened angle   History of colon polyps    History of frequent urinary tract infections    klebs- pansensitive. c. 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 Shyrl and Dr Wendel   Syncope 05/17/2017   Thyroid  disease    Urinary incontinence    Vertigo    had been prescribed antivert   Vitamin D  deficiency    Past Surgical History:  Procedure Laterality Date   ABDOMINAL AORTOGRAM N/A 04/26/2023   Procedure: ABDOMINAL AORTOGRAM;  Surgeon: Wendel Lurena POUR, MD;  [facility];  Service: Cardiovascular;  Laterality: N/A;   CARPAL TUNNEL RELEASE Bilateral 2019   CATARACT EXTRACTION Bilateral 1997   COLONOSCOPY  2010   INTRAOPERATIVE TRANSTHORACIC ECHOCARDIOGRAM N/A 07/12/2023   Procedure: ECHOCARDIOGRAM, TRANSTHORACIC;  Surgeon: Wendel Lurena POUR, MD;  [facility];  Service: Cardiovascular;  Laterality: N/A;   NASAL SEPTUM SURGERY  2007   RIGHT HEART CATH AND CORONARY ANGIOGRAPHY N/A 04/26/2023   Procedure: RIGHT HEART CATH AND CORONARY ANGIOGRAPHY;  Surgeon: Wendel Lurena POUR, MD;  [facility];  Service: Cardiovascular;  Laterality: N/A;   WRIST FRACTURE SURGERY Right 1996   Family History  Problem Relation Age of Onset   Arthritis Mother    COPD Mother    Prostate cancer Father    Arthritis Father    Hearing loss Father    Heart disease Father    Heart attack Father    Stroke Maternal Grandmother    Liver cancer Sister    Arthritis Brother    Prostate cancer Brother    COPD Brother    Heart disease Brother    Breast cancer Daughter    Blindness Maternal Grandfather    Stroke Maternal Grandfather    COPD Paternal Grandmother    Breast cancer Paternal Grandmother    Allergies as of 12/08/2023        Reactions   Augmentin  [amoxicillin -pot Clavulanate] Swelling, Rash   Swelling in face   Lactose Intolerance (gi) Diarrhea   bloating   Penicillin G Swelling   swelling of face        Medication List        Accurate as of December 08, 2023 11:06 AM. If you have any questions, ask your nurse or doctor.  STOP taking these medications    predniSONE  5 MG tablet Commonly known as: DELTASONE  Stopped by: Charlies Bellini       TAKE these medications    acetaminophen  500 MG tablet Commonly known as: TYLENOL  Take 500 mg by mouth 2 (two) times daily.   albuterol  108 (90 Base) MCG/ACT inhaler Commonly known as: VENTOLIN  HFA Inhale 2 puffs into the lungs every 6 (six) hours as needed for wheezing or shortness of breath.   amLODipine  2.5 MG tablet Commonly known as: NORVASC  Take 1 tablet (2.5 mg total) by mouth daily.   aspirin  81 MG chewable tablet Chew 1 tablet (81 mg total) by mouth daily.   CALCIUM 600 + D PO Take 1 tablet by mouth daily.   cycloSPORINE  0.05 % ophthalmic emulsion Commonly known as: RESTASIS  Place 1 drop into both eyes 2 (two) times daily.   diclofenac Sodium 1 % Gel Commonly known as: VOLTAREN Apply 1 Application topically 2 (two) times daily.   fluocinonide  ointment 0.05 % Commonly known as: LIDEX  Apply 1 Application topically 2 (two) times daily.   leflunomide  10 MG tablet Commonly known as: ARAVA  Take 10 mg by mouth daily.   levocetirizine 5 MG tablet Commonly known as: XYZAL Take 5 mg by mouth daily as needed for allergies.   levothyroxine  50 MCG tablet Commonly known as: SYNTHROID  Take 1 tablet (50 mcg total) by mouth daily before breakfast.   mirabegron  ER 25 MG Tb24 tablet Commonly known as: MYRBETRIQ  Take 1 tablet (25 mg total) by mouth daily.   montelukast  10 MG tablet Commonly known as: SINGULAIR  Take 1 tablet (10 mg total) by mouth at bedtime.   pregabalin  50 MG capsule Commonly known as: LYRICA  Take 1 capsule  (50 mg total) by mouth at bedtime.   PreserVision AREDS 2+Multi Vit Caps Take 1 tablet by mouth daily.   PROBIOTIC PO Take 1 capsule by mouth daily.   sulfaSALAzine 500 MG EC tablet Commonly known as: AZULFIDINE Take 500 mg by mouth 2 (two) times daily.        All past medical history, surgical history, allergies, family history, immunizations andmedications were updated in the EMR today and reviewed under the history and medication portions of their EMR.     ROS: Negative, with the exception of above mentioned in HPI  Objective:  BP 128/76   Pulse 71   Temp 98 F (36.7 C)   Wt 117 lb 9.6 oz (53.3 kg)   SpO2 96%   BMI 22.97 kg/m  Body mass index is 22.97 kg/m. Physical Exam Vitals and nursing note reviewed.  Constitutional:      General: She is not in acute distress.    Appearance: Normal appearance. She is normal weight. She is not ill-appearing or toxic-appearing.  HENT:     Head: Normocephalic and atraumatic.  Eyes:     General: No scleral icterus.       Right eye: No discharge.        Left eye: No discharge.     Extraocular Movements: Extraocular movements intact.     Conjunctiva/sclera: Conjunctivae normal.     Pupils: Pupils are equal, round, and reactive to light.  Cardiovascular:     Rate and Rhythm: Normal rate and regular rhythm.  Pulmonary:     Effort: Pulmonary effort is normal.     Breath sounds: Normal breath sounds.  Musculoskeletal:     Right lower leg: No edema.     Left lower leg: No edema.  Skin:  Findings: No rash.  Neurological:     Mental Status: She is alert and oriented to person, place, and time. Mental status is at baseline.     Motor: No weakness.     Coordination: Coordination normal.     Gait: Gait normal.  Psychiatric:        Mood and Affect: Mood normal.        Behavior: Behavior normal.        Thought Content: Thought content normal.        Judgment: Judgment normal.     No results found. No results found. No  results found for this or any previous visit (from the past 24 hours).   Assessment/Plan: Ty Oshima is a 89 y.o. female present for OV for  Nocturnal leg cramps/RLS/PVD Better with increase Increase Lyrica  25 mg nightly to 50 mg nightly Failed:  Mirapex . [place]  controlled substance database reviewed 12/08/23 CBC, CMP and iron levels collected Next have could consider baclofen nightly, or other muscle relaxant.  However currently she is describing the discomfort as more burning sensation.  Hypertension: improved Blood pressure above goal and has been last few visits. Continue  amlodipine  2.5 mg daily.   Return in about 20 weeks (around 04/26/2024) for Routine chronic condition follow-up.   Reviewed expectations re: course of current medical issues. Discussed self-management of symptoms. Outlined signs and symptoms indicating need for more acute intervention. Patient verbalized understanding and all questions were answered. Patient received an After-Visit Summary.    No orders of the defined types were placed in this encounter.   No orders of the defined types were placed in this encounter.   Referral Orders  No referral(s) requested today     Note is dictated utilizing voice recognition software. Although note has been proof read prior to signing, occasional typographical errors still can be missed. If any questions arise, please do not hesitate to call for verification.   electronically signed by:  Charlies Bellini, DO  [facility] [place]

## 2023-12-12 ENCOUNTER — Encounter: Payer: Self-pay | Admitting: Family Medicine

## 2023-12-12 ENCOUNTER — Ambulatory Visit: Admitting: Family Medicine

## 2023-12-12 VITALS — BP 126/76 | HR 72 | Temp 97.7°F | Wt 117.0 lb

## 2023-12-12 DIAGNOSIS — H6123 Impacted cerumen, bilateral: Secondary | ICD-10-CM | POA: Diagnosis not present

## 2023-12-12 MED ORDER — DEBROX 6.5 % OT SOLN: OTIC | 5 refills | Status: AC

## 2023-12-12 NOTE — Patient Instructions (Addendum)

## 2023-12-12 NOTE — Progress Notes (Signed)
 Margaret [name] , 1929/09/30, 89 y.o., female MRN: [id number] Patient Care Team    Relationship Specialty Notifications Start End  Catherine Charlies LABOR, DO PCP - General Family Medicine  03/08/19   Lonni Slain, MD PCP - Cardiology Cardiology  04/30/22   Wendel Lurena POUR, MD PCP - Structural Heart Cardiology  07/13/23   Valdemar Rogue, MD Consulting Physician Ophthalmology  03/29/18   Lelon JONELLE Ferrari, MD Consulting Physician Ophthalmology  03/29/18   Tricia Tawni CROME, MD Referring Physician Dermatology  08/02/19   Ishmael Slough, MD Consulting Physician Rheumatology  12/16/22     Chief Complaint  Patient presents with   Ear Fullness    1 week; Both ears. Denies pain.      Subjective: [name] is a 89 y.o. Pt presents for an OV with complaints of decreased hearing of few months duration duration.  She went to her audiology team and they reported b/l cerumen impaction.  Pt denies pain.      08/04/2023    3:05 PM 08/04/2023   11:25 AM 05/04/2023   11:40 AM 01/12/2023    3:05 PM 12/16/2022   10:04 AM  Depression screen PHQ 2/9  Decreased Interest 0 0 0 0 0  Down, Depressed, Hopeless 0 0 0 0 0  PHQ - 2 Score 0 0 0 0 0  Altered sleeping 0 0 1 1   Tired, decreased energy 1 1 1 1    Change in appetite 0 0 1 0   Feeling bad or failure about yourself  0 0 0 0   Trouble concentrating 1 1 0 0   Moving slowly or fidgety/restless 1 1 0 0   Suicidal thoughts 0 0 0 0   PHQ-9 Score 3 3 3 2    Difficult doing work/chores Somewhat difficult Somewhat difficult Not difficult at all Not difficult at all     Allergies  Allergen Reactions   Augmentin  [Amoxicillin -Pot Clavulanate] Swelling and Rash    Swelling in face   Lactose Intolerance (Gi) Diarrhea    bloating   Penicillin G Swelling    swelling of face   Social History   Social History Narrative   Marital status/children/pets: Widowed.  Moved to [place]  from [place]  [facility] area) 2019.  She shares a home with 1  of her daughters.  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   Cervical radiculopathy 05/16/2015   DDD C4-C5, C5-C6, and C6-C7.  Facet arthropathy throughout cervical spine.  Normal foraminal narrowing secondary to uncovertebral  arthropathy is seen bilaterally at C3-C4 and C5/C6   DDD (degenerative disc disease), lumbar 04/26/2015   Mild levoscoliosis, right anterior listhesis of L4 and L5.  DDD at every level.  Facet arthropathy at multiple levels.   Glaucoma    both eyes, mild opened angle   History of colon polyps    History of frequent urinary tract infections  klebs- pansensitive. c. 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 Shyrl and Dr Wendel   Syncope 05/17/2017   Thyroid  disease    Urinary incontinence    Vertigo    had been prescribed antivert   Vitamin D  deficiency    Past Surgical History:  Procedure Laterality Date   ABDOMINAL AORTOGRAM N/A 04/26/2023   Procedure: ABDOMINAL AORTOGRAM;  Surgeon:  Wendel Lurena POUR, MD;  [facility];  Service: Cardiovascular;  Laterality: N/A;   CARPAL TUNNEL RELEASE Bilateral 2019   CATARACT EXTRACTION Bilateral 1997   COLONOSCOPY  2010   INTRAOPERATIVE TRANSTHORACIC ECHOCARDIOGRAM N/A 07/12/2023   Procedure: ECHOCARDIOGRAM, TRANSTHORACIC;  Surgeon: Wendel Lurena POUR, MD;  [facility]: [facility];  Service: Cardiovascular;  Laterality: N/A;   NASAL SEPTUM SURGERY  2007   RIGHT HEART CATH AND CORONARY ANGIOGRAPHY N/A 04/26/2023   Procedure: RIGHT HEART CATH AND CORONARY ANGIOGRAPHY;  Surgeon: Wendel Lurena POUR, MD;  [facility];  Service: Cardiovascular;  Laterality: N/A;   WRIST FRACTURE SURGERY Right 1996   Family History  Problem Relation Age of Onset   Arthritis Mother    COPD Mother    Prostate cancer Father    Arthritis Father    Hearing loss Father    Heart disease Father    Heart attack Father    Stroke Maternal Grandmother    Liver cancer Sister    Arthritis Brother    Prostate cancer Brother    COPD Brother    Heart disease Brother    Breast cancer Daughter    Blindness Maternal Grandfather    Stroke Maternal Grandfather    COPD Paternal Grandmother    Breast cancer Paternal Grandmother    Allergies as of 12/12/2023       Reactions   Augmentin  [amoxicillin -pot Clavulanate] Swelling, Rash   Swelling in face   Lactose Intolerance (gi) Diarrhea   bloating   Penicillin G Swelling   swelling of face        Medication List        Accurate as of December 12, 2023  2:49 PM. If you have any questions, ask your nurse or doctor.          acetaminophen  500 MG tablet Commonly known as: TYLENOL  Take 500 mg by mouth 2 (two) times daily.   albuterol  108 (90 Base) MCG/ACT inhaler Commonly known as: VENTOLIN  HFA Inhale 2 puffs into the lungs every 6 (six) hours as needed for wheezing or shortness of breath.   amLODipine  2.5 MG tablet Commonly known as: NORVASC  Take 1 tablet (2.5 mg total)  by mouth daily.   aspirin  81 MG chewable tablet Chew 1 tablet (81 mg total) by mouth daily.   CALCIUM 600 + D PO Take 1 tablet by mouth daily.  cycloSPORINE  0.05 % ophthalmic emulsion Commonly known as: RESTASIS  Place 1 drop into both eyes 2 (two) times daily.   Debrox 6.5 % OTIC solution Generic drug: carbamide peroxide 2 x weekly Started by: Karleigh Bunte   diclofenac Sodium 1 % Gel Commonly known as: VOLTAREN Apply 1 Application topically 2 (two) times daily.   fluocinonide  ointment 0.05 % Commonly known as: LIDEX  Apply 1 Application topically 2 (two) times daily.   leflunomide  10 MG tablet Commonly known as: ARAVA  Take 10 mg by mouth daily.   levocetirizine 5 MG tablet Commonly known as: XYZAL Take 5 mg by mouth daily as needed for allergies.   levothyroxine  50 MCG tablet Commonly known as: SYNTHROID  Take 1 tablet (50 mcg total) by mouth daily before breakfast.   mirabegron  ER 25 MG Tb24 tablet Commonly known as: MYRBETRIQ  Take 1 tablet (25 mg total) by mouth daily.   montelukast  10 MG tablet Commonly known as: SINGULAIR  Take 1 tablet (10 mg total) by mouth at bedtime.   pregabalin  50 MG capsule Commonly known as: LYRICA  Take 1 capsule (50 mg total) by mouth at bedtime.   PreserVision AREDS 2+Multi Vit Caps Take 1 tablet by mouth daily.   PROBIOTIC PO Take 1 capsule by mouth daily.   sulfaSALAzine 500 MG EC tablet Commonly known as: AZULFIDINE Take 500 mg by mouth 2 (two) times daily.        All past medical history, surgical history, allergies, family history, immunizations andmedications were updated in the EMR today and reviewed under the history and medication portions of their EMR.     ROS Negative, with the exception of above mentioned in HPI   Objective:  BP 126/76   Pulse 72   Temp 97.7 F (36.5 C)   Wt 117 lb (53.1 kg)   SpO2 97%   BMI 22.85 kg/m  Body mass index is 22.85 kg/m.  Physical Exam Vitals and nursing note  reviewed.  Constitutional:      General: She is not in acute distress.    Appearance: Normal appearance. She is normal weight. She is not ill-appearing or toxic-appearing.  HENT:     Head: Normocephalic and atraumatic.     Right Ear: There is impacted cerumen.     Left Ear: There is impacted cerumen.  Eyes:     General: No scleral icterus.       Right eye: No discharge.        Left eye: No discharge.     Extraocular Movements: Extraocular movements intact.     Conjunctiva/sclera: Conjunctivae normal.     Pupils: Pupils are equal, round, and reactive to light.  Skin:    Findings: No rash.  Neurological:     Mental Status: She is alert and oriented to person, place, and time. Mental status is at baseline.     Motor: No weakness.     Coordination: Coordination normal.     Gait: Gait normal.  Psychiatric:        Mood and Affect: Mood normal.        Behavior: Behavior normal.        Thought Content: Thought content normal.        Judgment: Judgment normal.      No results found. No results found. No results found for this or any previous visit (from the past 24 hours).  Assessment/Plan: Threasa Kinch is a 89 y.o. female present for OV for  Bilateral hearing loss due to cerumen impaction (Primary) Procedure: Cerumen  disimpaction Patient was verbally consented to procedure. Water-peroxide solution was applied and gentle ear lavage performed on b/l ear(s).  There were no complications.  Tympanic membrane was visualized after disimpaction.  Tympanic membrane(s) intact.  Auditory canal(s) WNL.  Patient tolerated procedure well.  Patient reported relief of symptoms after removal of cerumen. Debrox prescribed for maintenance twice weekly  Reviewed expectations re: course of current medical issues. Discussed self-management of symptoms. Outlined signs and symptoms indicating need for more acute intervention. Patient verbalized understanding and all questions were answered. Patient  received an After-Visit Summary.    No orders of the defined types were placed in this encounter.  Meds ordered this encounter  Medications   carbamide peroxide (DEBROX) 6.5 % OTIC solution    Sig: 2 x weekly    Dispense:  15 mL    Refill:  5   Referral Orders  No referral(s) requested today     Note is dictated utilizing voice recognition software. Although note has been proof read prior to signing, occasional typographical errors still can be missed. If any questions arise, please do not hesitate to call for verification.   electronically signed by:  Charlies Bellini, DO  [facility] [place]

## 2023-12-14 NOTE — Progress Notes (Signed)
 [facility] - Clinic Note  12/16/2023    CHIEF COMPLAINT Patient presents for Retina Follow Up   HISTORY OF PRESENT ILLNESS: Margaret [name] is a 89 y.o. female who presents to the clinic today for:   HPI     Retina Follow Up   Patient presents with  Wet AMD.  In both eyes.  This started 12 weeks ago.  Duration of 12 weeks.  Since onset it is stable.  I, the attending physician,  performed the HPI with the patient and updated documentation appropriately.        Comments   12 week retina follow up AMD and IVV OS pt is reporting her reading vision is not as good out newer reading glasses she denies any flashes has some floaters she is using brom at bedtime ou       Last edited by Valdemar Rogue, MD on 12/16/2023 12:55 PM.     Pt states her vision seems better some days than others. Got new readers which has helped.   Referring physician: Catherine Charlies LABOR, DO [address] [place],  [place] [address]  HISTORICAL INFORMATION:  Selected notes from the MEDICAL RECORD NUMBER Self referral for macular degeneration LEE: 03.21.19 (Dr. Susie in [place], [place]) [BCVA: OD: 20/40 OS: 20/40] Ocular Hx-POAG, non-exu ARMD, DES, pseudo, YAG PMH-astham, arthritis,    CURRENT MEDICATIONS: Current Outpatient Medications (Ophthalmic Drugs)  Medication Sig   cycloSPORINE  (RESTASIS ) 0.05 % ophthalmic emulsion Place 1 drop into both eyes 2 (two) times daily.   No current facility-administered medications for this visit. (Ophthalmic Drugs)   Current Outpatient Medications (Other)  Medication Sig   acetaminophen  (TYLENOL ) 500 MG tablet Take 500 mg by mouth 2 (two) times daily.   albuterol  (VENTOLIN  HFA) 108 (90 Base) MCG/ACT inhaler Inhale 2 puffs into the lungs every 6 (six) hours as needed for wheezing or shortness of breath.   amLODipine  (NORVASC ) 2.5 MG tablet Take 1 tablet (2.5 mg total) by mouth daily.   aspirin  81 MG chewable tablet Chew 1 tablet (81 mg total) by mouth  daily.   Calcium Carb-Cholecalciferol (CALCIUM 600 + D PO) Take 1 tablet by mouth daily.   carbamide peroxide (DEBROX) 6.5 % OTIC solution 2 x weekly   diclofenac Sodium (VOLTAREN) 1 % GEL Apply 1 Application topically 2 (two) times daily.   fluocinonide  ointment (LIDEX ) 0.05 % Apply 1 Application topically 2 (two) times daily.   leflunomide  (ARAVA ) 10 MG tablet Take 10 mg by mouth daily.   levocetirizine (XYZAL) 5 MG tablet Take 5 mg by mouth daily as needed for allergies.   levothyroxine  (SYNTHROID ) 50 MCG tablet Take 1 tablet (50 mcg total) by mouth daily before breakfast.   mirabegron  ER (MYRBETRIQ ) 25 MG TB24 tablet Take 1 tablet (25 mg total) by mouth daily.   montelukast  (SINGULAIR ) 10 MG tablet Take 1 tablet (10 mg total) by mouth at bedtime.   Multiple Vitamins-Minerals (PRESERVISION AREDS 2+MULTI VIT) CAPS Take 1 tablet by mouth daily.   pregabalin  (LYRICA ) 50 MG capsule Take 1 capsule (50 mg total) by mouth at bedtime.   Probiotic Product (PROBIOTIC PO) Take 1 capsule by mouth daily.   sulfaSALAzine (AZULFIDINE) 500 MG EC tablet Take 500 mg by mouth 2 (two) times daily.   No current facility-administered medications for this visit. (Other)   REVIEW OF SYSTEMS: ROS   Positive for: Musculoskeletal, Endocrine, Eyes Negative for: Constitutional, Gastrointestinal, Neurological, Skin, Genitourinary, HENT, Cardiovascular, Respiratory, Psychiatric, Allergic/Imm, Heme/Lymph Last  edited by Resa Delon ORN, COT on 12/16/2023 12:25 PM.      ALLERGIES Allergies  Allergen Reactions   Augmentin  [Amoxicillin -Pot Clavulanate] Swelling and Rash    Swelling in face   Lactose Intolerance (Gi) Diarrhea    bloating   Penicillin G Swelling    swelling of face   PAST MEDICAL HISTORY Past Medical History:  Diagnosis Date   Allergy    Asthma    Carotid artery stenosis 10/17/2012   stable report in 2018- moderate on right, minimal on left   Carpal tunnel syndrome 2018   left.    Cervical radiculopathy 05/16/2015   DDD C4-C5, C5-C6, and C6-C7.  Facet arthropathy throughout cervical spine.  Normal foraminal narrowing secondary to uncovertebral  arthropathy is seen bilaterally at C3-C4 and C5/C6   DDD (degenerative disc disease), lumbar 04/26/2015   Mild levoscoliosis, right anterior listhesis of L4 and L5.  DDD at every level.  Facet arthropathy at multiple levels.   Glaucoma    both eyes, mild opened angle   History of colon polyps    History of frequent urinary tract infections    klebs- pansensitive. c. 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 Shyrl and Dr Wendel   Syncope 05/17/2017   Thyroid  disease    Urinary incontinence    Vertigo    had been prescribed antivert   Vitamin D  deficiency    Past Surgical History:  Procedure Laterality Date   ABDOMINAL AORTOGRAM N/A 04/26/2023   Procedure: ABDOMINAL AORTOGRAM;  Surgeon: Wendel Lurena POUR, MD;  Location: [facility];  Service: Cardiovascular;  Laterality: N/A;   CARPAL TUNNEL RELEASE Bilateral 2019   CATARACT EXTRACTION Bilateral 1997   COLONOSCOPY  2010   INTRAOPERATIVE TRANSTHORACIC ECHOCARDIOGRAM N/A 07/12/2023   Procedure: ECHOCARDIOGRAM, TRANSTHORACIC;  Surgeon: Wendel Lurena POUR, MD;  Location: [facility];  Service: Cardiovascular;  Laterality: N/A;   NASAL SEPTUM SURGERY  2007   RIGHT HEART CATH AND CORONARY ANGIOGRAPHY N/A 04/26/2023    Procedure: RIGHT HEART CATH AND CORONARY ANGIOGRAPHY;  Surgeon: Wendel Lurena POUR, MD;  Location: [facility];  Service: Cardiovascular;  Laterality: N/A;   WRIST FRACTURE SURGERY Right 1996   FAMILY HISTORY Family History  Problem Relation Age of Onset   Arthritis Mother    COPD Mother    Prostate cancer Father    Arthritis Father    Hearing loss Father    Heart disease Father    Heart attack Father    Stroke Maternal Grandmother    Liver cancer Sister    Arthritis Brother    Prostate cancer Brother    COPD Brother    Heart disease Brother    Breast cancer Daughter    Blindness Maternal Grandfather    Stroke Maternal Grandfather  COPD Paternal Grandmother    Breast cancer Paternal Grandmother    SOCIAL HISTORY Social History   Tobacco Use   Smoking status: Former    Current packs/day: 0.00    Average packs/day: 0.5 packs/day for 13.0 years (6.5 ttl pk-yrs)    Types: Cigarettes    Start date: 65    Quit date: 1965    Years since quitting: 60.8   Smokeless tobacco: Never  Vaping Use   Vaping status: Never Used  Substance Use Topics   Alcohol  use: Not Currently    Alcohol /week: 5.0 standard drinks of alcohol     Types: 5 Standard drinks or equivalent per week   Drug use: Never       OPHTHALMIC EXAM:  Base Eye Exam     Visual Acuity (Snellen - Linear)       Right Left   Dist cc 20/40 20/30   Dist ph cc NI NI    Correction: Glasses         Tonometry (Tonopen, 12:32 PM)       Right Left   Pressure 10 10         Pupils       Pupils Dark Light Shape React APD   Right PERRL 3 2 Round Brisk None   Left PERRL 3 2 Round Brisk None         Visual Fields       Left Right    Full          Extraocular Movement       Right Left    Full, Ortho Full, Ortho         Neuro/Psych     Oriented x3: Yes   Mood/Affect: Normal         Dilation     Both eyes: 2.5% Phenylephrine @ 12:30 PM           Slit Lamp and Fundus Exam      External Exam       Right Left   External Normal Normal         Slit Lamp Exam       Right Left   Lids/Lashes Dermatochalasis - upper lid, Telangiectasia Dermatochalasis - upper lid, mild Meibomian gland dysfunction   Conjunctiva/Sclera Temporal Pinguecula White and quiet   Cornea Arcus, 1+ Punctate epithelial erosions, mild tear film debris, fine endo pigment Arcus, 1-2+ inferior Punctate epithelial erosions, mild tear film debris, fine endo pigment   Anterior Chamber Deep and quiet, narrow temporal angle Deep and quiet   Iris Round and dilated Round and dilated, pigmented mass at 0200 and 0730 behind iris, anterior to IOL -- ?regression of 0200 lesion   Lens PC IOL in good position with open PC PC IOL in good position with open PC   Anterior Vitreous Vitreous syneresis Vitreous syneresis, Posterior vitreous detachment         Fundus Exam       Right Left   Disc 360 Peripapillary atrophy, Sharp rim, 2+ Pallor sharp rim, temporal PPA, mild Pallor   C/D Ratio 0.3 0.3   Macula Flat, Blunted foveal reflex, drusen, RPE mottling and clumping, trace cystic changes -- stably improved, no heme, early RPE atrophy Flat, Blunted foveal reflex, drusen, RPE mottling, clumping and early atrophy, no heme or edema   Vessels attenuated, Tortuous attenuated, Tortuous   Periphery Attached, scattered Reticular degeneration, pigmented cystoid degeneration temporally, No heme Attached, scattered Reticular degeneration, No heme  Refraction     Wearing Rx       Sphere Cylinder Axis Add   Right -0.75 +2.25 175 +2.75   Left -0.25 +1.25 165 +2.75           IMAGING AND PROCEDURES  Imaging and Procedures for @TODAY @  OCT, Retina - OU - Both Eyes       Right Eye Quality was good. Central Foveal Thickness: 281. Progression has been stable. Findings include normal foveal contour, no IRF, no SRF, retinal drusen , intraretinal hyper-reflective material, epiretinal membrane, outer  retinal atrophy (Stable improvement in trace cystic changes temporal fovea, no fluid).   Left Eye Quality was good. Central Foveal Thickness: 304. Progression has been stable. Findings include normal foveal contour, no IRF, no SRF, retinal drusen , pigment epithelial detachment, outer retinal atrophy ( ).   Notes *Images captured and stored on drive  Diagnosis / Impression:  OD: Exudative ARMD - Stable improvement in trace cystic changes temporal fovea, no fluid OS: Nonexudative ARMD OS; Trace cystic changes temporal fovea, trace ERM, partial PVD  Clinical management:  See below  Abbreviations: NFP - Normal foveal profile. CME - cystoid macular edema. PED - pigment epithelial detachment. IRF - intraretinal fluid. SRF - subretinal fluid. EZ - ellipsoid zone. ERM - epiretinal membrane. ORA - outer retinal atrophy. ORT - outer retinal tubulation. SRHM - subretinal hyper-reflective material       Intravitreal Injection, Pharmacologic Agent - OD - Right Eye       Time Out 12/16/2023. 12:36 PM. Confirmed correct patient, procedure, site, and patient consented.   Anesthesia Topical anesthesia was used. Anesthetic medications included Lidocaine  2%, Proparacaine 0.5%.   Procedure Preparation included 5% betadine to ocular surface, eyelid speculum. A supplied (32g) needle was used.   Injection: 6 mg faricimab -svoa 6 MG/0.05ML   Route: Intravitreal, Site: Right Eye   NDC: 49757-903-93, Lot: [id number], Expiration date: 02/21/2025, Waste: 0 mL   Post-op Post injection exam found visual acuity of at least counting fingers. The patient tolerated the procedure well. There were no complications. The patient received written and verbal post procedure care education. Post injection medications were not given.             ASSESSMENT/PLAN:    ICD-10-CM   1. Exudative age-related macular degeneration of right eye with active choroidal neovascularization (HCC)  H35.3211 OCT, Retina - OU -  Both Eyes    Intravitreal Injection, Pharmacologic Agent - OD - Right Eye    faricimab -svoa (VABYSMO ) 6mg /0.14mL intravitreal injection    2. Posterior vitreous detachment of right eye  H43.811     3. 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 Long-term use of Plaquenil   Z79.899     6. Pseudophakia of both eyes  Z96.1     7. Primary open angle glaucoma (POAG) of both eyes, mild stage  H40.1131     8. Dry eyes  H04.123       1. Exudative age related macular degeneration, OD  - conversion from nonexudative ARMD noted on 9.23.22 visit  - s/p IVA OD #1 (09.23.22), #2 (10.21.22), #3 (11.18.22), #4 (12.30.22), #5 (02.13.23), #6 (03.24.23), #7 (05.08.23), #8 (06.09.23) -- IVA resistance  - s/p IVE OD #1 (07.24.23), #2 (08.25.23), #3 (09.29.23), #4 (10.27.23) -- IVE resistance  - s/p IVV OD #1 (12.01.23), #2 (01.05.34), #3 (02.02.24), #4 (03.01.24), #5 (03.29.24), #6 (04.26.24), #7 (05.24.24), #  8 (06.28.24) #9 (08.16.24), #10 (10.18.24), #11 (12.20.24), #12 (02.27.25), #13 (05.16.25) #14(08.01.25) - BCVA OD 20/40 from 20/30  - OCT shows OD: Stable improvement in trace cystic changes temporal fovea, no fluid at 12 weeks - recommend IVV OD #15 today, 10.24 .25 w/ f/u at 12 wks - pt wishes to be treated with IVV - RBA of procedure discussed, questions answered - Vabysmo  informed consent obtained and signed, 12.01.23 - see procedure note - f/u 12 weeks -- DFE/OCT, possible injection   2. Age related macular degeneration, non-exudative OS  - intermediate stage-no change from previous visit - The incidence, anatomy, and pathology of dry AMD, risk of progression, and the AREDS and AREDS 2 study including smoking risks discussed with patient.   - recommend Amsler grid monitoring  3. PVD / vitreous syneresis OD  - Discussed findings and prognosis  - No RT or RD on 360 scleral depressed exam  - Reviewed s/s of  RT/RD  - Strict return precautions for any such RT/RD signs/symptoms  - f/u in 3-4 wks -- DFE/OCT  4. Iris/ciliary body mass OS - pigmented lesion behind iris but anterior to PCIOL at 0730 and 0300 - 0730 lesion spans 0630 to 0830 behind dilated iris and larger than 0300 lesion  - patient asymptomatic - pt saw Dr. Mort, [facility] on 11.01.22 who dx her with iris margin cyst  5. Plaquenil  use for RA  - baseline testing done 05.20.20 -- started plaquenil  early May 2020 for RA - HVF 10-2 showed non-specific defects OU -- essentially normal baseline test - OCT shows no obvious plaquenil -related changes, but interval development of exudative ARMD OD as above - currently taking 200mg  / day per Dr. Carmine instructions --> 3.29 mg/kg body wt per day  - po prednisone , currently as needed which is about once a week - and is also now on 10mg  po MTX weekly + folic acid  - the American Academy of Ophthalmology recommends dosing 5mg /kg per day to reduce risk of retinal toxicity  - recommend consideration of using alternate medication for RA especially given co-morbidity of age-related macular degeneration OU  - [facility] Leni [facility]  6. Pseudophakia OU - s/p CE/IOL w/ ?canaloplasty OU by Dr. Laurey at [facility]  - doing well  - continue to monitor  7. History of Glaucoma   - Managed by Dr. Marylouise latanoprost at bedtime OU - ?underwent canaloplasty in conjunction with CEIOL at [facility] clinic   8. Dry Eye Syndrome  - use AT's OU as needed  Ophthalmic Meds Ordered this visit:  Meds ordered this encounter  Medications   faricimab -svoa (VABYSMO ) 6mg /0.36mL intravitreal injection     This document serves as a record of services personally performed by Redell JUDITHANN Hans, MD, PhD. It was created on their behalf by Delon Newness COT, an ophthalmic technician. The creation of this record is the provider's dictation and/or  activities during the visit.    Electronically signed by: Delon Newness COT 10.22.25 2:50 PM  This document serves as a record of services personally performed by Redell JUDITHANN Hans, MD, PhD. It was created on their behalf by Almetta Pesa, an ophthalmic technician. The creation of this record is the provider's dictation and/or activities during the visit.    Electronically signed by: Almetta Pesa, OA, 12/22/23  2:50 PM  Redell JUDITHANN Hans, M.D., Ph.D. Diseases & Surgery of the Retina and Vitreous [facility] 12/16/2023   I have  reviewed the above documentation for accuracy and completeness, and I agree with the above. Redell JUDITHANN Hans, M.D., Ph.D. 12/22/23 2:50 PM   Abbreviations: M myopia (nearsighted); A astigmatism; H hyperopia (farsighted); P presbyopia; Mrx spectacle prescription;  CTL contact lenses; OD right eye; OS left eye; OU both eyes  XT exotropia; ET esotropia; PEK punctate epithelial keratitis; PEE punctate epithelial erosions; DES dry eye syndrome; MGD meibomian gland dysfunction; ATs artificial tears; PFAT's preservative free artificial tears; NSC nuclear sclerotic cataract; PSC posterior subcapsular cataract; ERM epi-retinal membrane; PVD posterior vitreous detachment; RD retinal detachment; DM diabetes mellitus; DR diabetic retinopathy; NPDR non-proliferative diabetic retinopathy; PDR proliferative diabetic retinopathy; CSME clinically significant macular edema; DME diabetic macular edema; dbh dot blot hemorrhages; CWS cotton wool spot; POAG primary open angle glaucoma; C/D cup-to-disc ratio; HVF humphrey visual field; GVF goldmann visual field; OCT optical coherence tomography; IOP intraocular pressure; BRVO Branch retinal vein occlusion; CRVO central retinal vein occlusion; CRAO central retinal artery occlusion; BRAO branch retinal artery occlusion; RT retinal tear; SB scleral buckle; PPV pars plana vitrectomy; VH Vitreous hemorrhage; PRP panretinal  laser photocoagulation; IVK intravitreal kenalog; VMT vitreomacular traction; MH Macular hole;  NVD neovascularization of the disc; NVE neovascularization elsewhere; AREDS age related eye disease study; ARMD age related macular degeneration; POAG primary open angle glaucoma; EBMD epithelial/anterior basement membrane dystrophy; ACIOL anterior chamber intraocular lens; IOL intraocular lens; PCIOL posterior chamber intraocular lens; Phaco/IOL phacoemulsification with intraocular lens placement; PRK photorefractive keratectomy; LASIK laser assisted in situ keratomileusis; HTN hypertension; DM diabetes mellitus; COPD chronic obstructive pulmonary disease

## 2023-12-16 ENCOUNTER — Ambulatory Visit (INDEPENDENT_AMBULATORY_CARE_PROVIDER_SITE_OTHER): Admitting: Ophthalmology

## 2023-12-16 ENCOUNTER — Encounter (INDEPENDENT_AMBULATORY_CARE_PROVIDER_SITE_OTHER): Payer: Self-pay | Admitting: Ophthalmology

## 2023-12-16 DIAGNOSIS — H353211 Exudative age-related macular degeneration, right eye, with active choroidal neovascularization: Secondary | ICD-10-CM | POA: Diagnosis not present

## 2023-12-16 DIAGNOSIS — H353122 Nonexudative age-related macular degeneration, left eye, intermediate dry stage: Secondary | ICD-10-CM | POA: Diagnosis not present

## 2023-12-16 DIAGNOSIS — D4989 Neoplasm of unspecified behavior of other specified sites: Secondary | ICD-10-CM

## 2023-12-16 DIAGNOSIS — H43811 Vitreous degeneration, right eye: Secondary | ICD-10-CM | POA: Diagnosis not present

## 2023-12-16 DIAGNOSIS — H401131 Primary open-angle glaucoma, bilateral, mild stage: Secondary | ICD-10-CM

## 2023-12-16 DIAGNOSIS — H04123 Dry eye syndrome of bilateral lacrimal glands: Secondary | ICD-10-CM

## 2023-12-16 DIAGNOSIS — Z79899 Other long term (current) drug therapy: Secondary | ICD-10-CM

## 2023-12-16 DIAGNOSIS — Z961 Presence of intraocular lens: Secondary | ICD-10-CM

## 2023-12-16 MED ORDER — FARICIMAB-SVOA 6 MG/0.05ML IZ SOSY
6.0000 mg | PREFILLED_SYRINGE | INTRAVITREAL | Status: AC | PRN
  Administered 2023-12-16: 6 mg via INTRAVITREAL

## 2023-12-26 ENCOUNTER — Telehealth: Payer: Self-pay

## 2023-12-26 DIAGNOSIS — H6123 Impacted cerumen, bilateral: Secondary | ICD-10-CM

## 2023-12-26 NOTE — Telephone Encounter (Signed)
 Okay to refer?

## 2023-12-26 NOTE — Addendum Note (Signed)
 Addended by: CLAUDENE SHANDA ORN on: 12/26/2023 03:25 PM   Modules accepted: Orders

## 2023-12-26 NOTE — Telephone Encounter (Signed)
 Did the patient discuss referral with their provider in the last year? Yes (If No - schedule appointment) (If Yes - send message)  Appointment offered? No  Type of order/referral and detailed reason for visit: [facility] throat doctor  Preference of office, provider, location: [facility]  If referral order, have you been seen by this specialty before? No  (If Yes, this issue or another issue? When? Where?  Can we respond through MyChart? Yes

## 2024-01-02 NOTE — Telephone Encounter (Signed)
 No further action needed at this time.

## 2024-01-03 ENCOUNTER — Encounter (HOSPITAL_BASED_OUTPATIENT_CLINIC_OR_DEPARTMENT_OTHER): Payer: Self-pay

## 2024-01-04 ENCOUNTER — Encounter (HOSPITAL_BASED_OUTPATIENT_CLINIC_OR_DEPARTMENT_OTHER): Payer: Self-pay | Admitting: Cardiology

## 2024-01-04 ENCOUNTER — Ambulatory Visit (HOSPITAL_BASED_OUTPATIENT_CLINIC_OR_DEPARTMENT_OTHER): Admitting: Cardiology

## 2024-01-04 VITALS — BP 102/62 | HR 64 | Ht 60.0 in | Wt 118.2 lb

## 2024-01-04 DIAGNOSIS — I447 Left bundle-branch block, unspecified: Secondary | ICD-10-CM | POA: Diagnosis not present

## 2024-01-04 DIAGNOSIS — R030 Elevated blood-pressure reading, without diagnosis of hypertension: Secondary | ICD-10-CM

## 2024-01-04 DIAGNOSIS — I2510 Atherosclerotic heart disease of native coronary artery without angina pectoris: Secondary | ICD-10-CM

## 2024-01-04 DIAGNOSIS — I6523 Occlusion and stenosis of bilateral carotid arteries: Secondary | ICD-10-CM

## 2024-01-04 DIAGNOSIS — R011 Cardiac murmur, unspecified: Secondary | ICD-10-CM | POA: Diagnosis not present

## 2024-01-04 DIAGNOSIS — Z952 Presence of prosthetic heart valve: Secondary | ICD-10-CM

## 2024-01-04 NOTE — Progress Notes (Signed)
  Cardiology Office Note:  .    Date:  01/04/2024  ID:  Rojelio Abbot, DOB [birth year], MRN [id number] PCP: Catherine Charlies LABOR, DO  [facility] Providers Cardiologist:  Shelda Bruckner, MD Structural Heart:  Lurena MARLA Red, MD    History of Present Illness: .    Margaret [name] is a 89 y.o. female with a hx of PMR/RA, aortic stenosis s/p TAVR  06/2023, neuropathy who is seen for follow-up today. I initially met her 04/06/2022 as a new consult at the request of [name], [name], DO for the evaluation and management of aortic stenosis.   Today: Doing very well post TAVR. No shortness of breath or edema. Now able to be busy around the house, garden, walk the dog.   ROS: Denies chest pain, shortness of breath at rest or with normal exertion. No PND, orthopnea, LE edema or unexpected weight gain. No syncope or palpitations. ROS otherwise negative except as noted.   Studies Reviewed: SABRA         Physical Exam:    VS:  BP 102/62   Pulse 64   Ht 5' (1.524 m)   Wt 118 lb 3.2 oz (53.6 kg)   SpO2 99%   BMI 23.08 kg/m    Wt Readings from Last 3 Encounters:  01/04/24 118 lb 3.2 oz (53.6 kg)  12/12/23 117 lb (53.1 kg)  12/08/23 117 lb 9.6 oz (53.3 kg)    GEN: Well nourished, well developed in no acute distress HEENT: Normal, moist mucous membranes NECK: No JVD CARDIAC: regular rhythm, normal S1 and S2, no rubs or gallops. 1/6 systolic aortic murmur and 2/6 systolic mitral murmur. VASCULAR: Radial and DP pulses 2+ bilaterally. No carotid bruits RESPIRATORY:  Clear to auscultation without rales, wheezing or rhonchi  ABDOMEN: Soft, non-tender, non-distended MUSCULOSKELETAL:  Ambulates independently SKIN: Warm and dry, no edema NEUROLOGIC:  Alert and oriented x 3. No focal neuro deficits noted. PSYCHIATRIC:  Normal affect    ASSESSMENT AND PLAN: .    Aortic stenosis, severe S/P TAVR 07/12/23 (23 mm Edwards Sapien 3 Ultra Resilia) -most recent echo 08/22/23 with EF 50-55%, severe  biatrial enlargement, mild-moderate MR with significant MAC, TAVR well seated with mean gradient 5.8 mmHg -on aspirin . Discussed antibiotics before dental procedures, she has azithromycin  due to allergy  LBBB -new after TAVR -no syncope, monitor  White coat syndrome -running low today but asymptomatic -well controlled BP at home, avoid overtreatment  Murmur -both TAVR and MR murmurs as noted  Minimal nonobstructive CAD Bilateral carotid stenosis, nonocclusive -asymptomatic from both cardiac and carotid perspective -last checked carotids in 2016 -she is on aspirin  for her TAVR.  -We discussed rechecking carotids, also discussed statin for management of cholesterol. We both agreed that given her age and lack of symptoms, we will be conservative and not routinely follow carotids or start statin  Dispo: 12 mos or sooner as needed, to stagger with TAVR visit in 6 mos  Signed, Shelda Bruckner, MD

## 2024-01-04 NOTE — Patient Instructions (Signed)
 Medication Instructions:  No changes *If you need a refill on your cardiac medications before your next appointment, please call your pharmacy*  Lab Work: none If you have labs (blood work) drawn today and your tests are completely normal, you will receive your results only by: MyChart Message (if you have MyChart) OR A paper copy in the mail If you have any lab test that is abnormal or we need to change your treatment, we will call you to review the results.  Testing/Procedures: none  Follow-Up: At [facility], you and your health needs are our priority.  As part of our continuing mission to provide you with exceptional heart care, our providers are all part of one team.  This team includes your primary Cardiologist (physician) and Advanced Practice Providers or APPs (Physician Assistants and Nurse Practitioners) who all work together to provide you with the care you need, when you need it.  Your next appointment:   12 month(s)  Provider:   Shelda Bruckner, MD, Rosaline Bane, NP, or Reche Finder, NP

## 2024-01-13 ENCOUNTER — Encounter: Admitting: Family Medicine

## 2024-01-16 ENCOUNTER — Telehealth: Payer: Self-pay

## 2024-01-16 ENCOUNTER — Other Ambulatory Visit: Payer: Self-pay

## 2024-01-16 MED ORDER — PREGABALIN 100 MG PO CAPS: 100.0000 mg | ORAL_CAPSULE | Freq: Every day | ORAL | 1 refills | Status: AC

## 2024-01-16 NOTE — Telephone Encounter (Signed)
 Please make sure pt understands the new tab is 100 mg each, ONL:Y take one before bed

## 2024-01-16 NOTE — Telephone Encounter (Signed)
 Copied from CRM #[id number]. Topic: Clinical - Prescription Issue >> Jan 16, 2024 10:09 AM Victoria A wrote: Reason for CRM: Patient called said that [facility] told her pregabalin  (LYRICA ) 50 MG capsule can not be refilled until January. Patient would like a call back regarding this

## 2024-01-16 NOTE — Telephone Encounter (Signed)
 Pt refilled on 11/27/23.

## 2024-01-16 NOTE — Telephone Encounter (Signed)
 Pt states she is out of Rx due to increased dosage. Please advise per 10/16 note-Increase Lyrica  25 mg nightly to 50 mg nightly   Pt has been taking 100 mg; rx is pending

## 2024-01-24 ENCOUNTER — Ambulatory Visit (INDEPENDENT_AMBULATORY_CARE_PROVIDER_SITE_OTHER)

## 2024-01-24 ENCOUNTER — Encounter (INDEPENDENT_AMBULATORY_CARE_PROVIDER_SITE_OTHER): Payer: Self-pay

## 2024-01-24 VITALS — BP 164/73 | HR 65 | Wt 115.0 lb

## 2024-01-24 DIAGNOSIS — Z974 Presence of external hearing-aid: Secondary | ICD-10-CM

## 2024-01-24 DIAGNOSIS — H6122 Impacted cerumen, left ear: Secondary | ICD-10-CM

## 2024-01-24 DIAGNOSIS — R490 Dysphonia: Secondary | ICD-10-CM

## 2024-01-24 DIAGNOSIS — H903 Sensorineural hearing loss, bilateral: Secondary | ICD-10-CM

## 2024-01-24 NOTE — Progress Notes (Signed)
 Dear Dr. Catherine, Here is my assessment for our mutual patient, Margaret [name]. Thank you for allowing me the opportunity to care for your patient. Please do not hesitate to contact me should you have any other questions. Sincerely, Dr. Penne Croak  Otolaryngology Clinic Note Referring provider: Dr. Catherine HPI:  Discussed the use of AI scribe software for clinical note transcription with the patient, who gave verbal consent to proceed.  History of Present Illness Margaret [name] is a 89 year old female who presents with earwax impaction affecting her hearing aid use.  Cerumen impaction - Earwax impaction affecting ability to use hearing aids and undergo hearing testing - Previous attempt at wax removal at another facility was painful and did not completely resolve the impaction - Regular use of ear drops to soften cerumen - Impaired completion of hearing test due to persistent cerumen  Dysphonia - Strained voice present for two to three years - Describes voice as 'old' - Occasionally bothersome but does not significantly impact daily life - No prior treatment sought for this symptom  Musculoskeletal symptoms - No back pain   PMH/Meds/All/SocHx/FamHx/ROS:   Past Medical History:  Diagnosis Date   Allergy    Asthma    Carotid artery stenosis 10/17/2012   stable report in 2018- moderate on right, minimal on left   Carpal tunnel syndrome 2018   left.   Cervical radiculopathy 05/16/2015   DDD C4-C5, C5-C6, and C6-C7.  Facet arthropathy throughout cervical spine.  Normal foraminal narrowing secondary to uncovertebral  arthropathy is seen bilaterally at C3-C4 and C5/C6   DDD (degenerative disc disease), lumbar 04/26/2015   Mild levoscoliosis, right anterior listhesis of L4 and L5.  DDD at every level.  Facet arthropathy at multiple levels.   Glaucoma    both eyes, mild opened angle   History of colon polyps    History of frequent urinary tract infections    klebs-  pansensitive. c. 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 Shyrl and Dr Wendel   Syncope 05/17/2017   Thyroid  disease    Urinary incontinence    Vertigo    had been prescribed antivert   Vitamin D  deficiency      Past Surgical History:  Procedure Laterality Date   ABDOMINAL AORTOGRAM N/A 04/26/2023   Procedure: ABDOMINAL AORTOGRAM;  Surgeon: Wendel Lurena POUR, MD;  Location: [facility];  Service: Cardiovascular;  Laterality: N/A;   CARPAL TUNNEL RELEASE Bilateral 2019   CATARACT EXTRACTION Bilateral 1997   COLONOSCOPY  2010   INTRAOPERATIVE TRANSTHORACIC ECHOCARDIOGRAM N/A 07/12/2023   Procedure: ECHOCARDIOGRAM, TRANSTHORACIC;  Surgeon: Wendel Lurena POUR, MD;  Location: [facility];  Service: Cardiovascular;  Laterality: N/A;   NASAL SEPTUM SURGERY  2007   RIGHT HEART CATH AND CORONARY ANGIOGRAPHY N/A 04/26/2023   Procedure:  RIGHT HEART CATH AND CORONARY ANGIOGRAPHY;  Surgeon: Thukkani, Arun K, MD;  Location: [facility];  Service: Cardiovascular;  Laterality: N/A;   WRIST FRACTURE SURGERY Right 1996    Family History  Problem Relation Age of Onset   Arthritis Mother    COPD Mother    Prostate cancer Father    Arthritis Father    Hearing loss Father    Heart disease Father    Heart attack Father    Stroke Maternal Grandmother    Liver cancer Sister    Arthritis Brother    Prostate cancer Brother    COPD Brother     Heart disease Brother    Breast cancer Daughter    Blindness Maternal Grandfather    Stroke Maternal Grandfather    COPD Paternal Grandmother    Breast cancer Paternal Grandmother      Social Connections: Socially Isolated (07/13/2023)   Social Connection and Isolation Panel    Frequency of Communication with Friends and Family: Twice a week    Frequency of Social Gatherings with Friends and Family: More than three times a week    Attends Religious Services: Never    Database Administrator or Organizations: No    Attends Banker Meetings: Never    Marital Status: Widowed      Current Outpatient Medications:    acetaminophen  (TYLENOL ) 500 MG tablet, Take 500 mg by mouth 2 (two) times daily., Disp: , Rfl:    albuterol  (VENTOLIN  HFA) 108 (90 Base) MCG/ACT inhaler, Inhale 2 puffs into the lungs every 6 (six) hours as needed for wheezing or shortness of breath., Disp: 1 each, Rfl: 0   amLODipine  (NORVASC ) 2.5 MG tablet, Take 1 tablet (2.5 mg total) by mouth daily., Disp: 90 tablet, Rfl: 1   aspirin  81 MG chewable tablet, Chew 1 tablet (81 mg total) by mouth daily., Disp: , Rfl:    Calcium Carb-Cholecalciferol (CALCIUM 600 + D PO), Take 1 tablet by mouth daily., Disp: , Rfl:    carbamide peroxide (DEBROX) 6.5 % OTIC solution, 2 x weekly, Disp: 15 mL, Rfl: 5   cycloSPORINE  (RESTASIS ) 0.05 % ophthalmic emulsion, Place 1 drop into both eyes 2 (two) times daily., Disp: , Rfl:    diclofenac Sodium (VOLTAREN) 1 % GEL, Apply 1 Application topically 2 (two) times daily., Disp: , Rfl:    fluocinonide  ointment (LIDEX ) 0.05 %, Apply 1 Application topically 2 (two) times daily., Disp: , Rfl:    latanoprost (XALATAN) 0.005 % ophthalmic solution, 1 drop at bedtime., Disp: , Rfl:    leflunomide  (ARAVA ) 10 MG tablet, Take 10 mg by mouth daily., Disp: , Rfl:    levocetirizine (XYZAL) 5 MG tablet, Take 5 mg by mouth daily as needed for allergies., Disp: , Rfl:    levothyroxine  (SYNTHROID ) 50  MCG tablet, Take 1 tablet (50 mcg total) by mouth daily before breakfast., Disp: 90 tablet, Rfl: 3   mirabegron  ER (MYRBETRIQ ) 25 MG TB24 tablet, Take 1 tablet (25 mg total) by mouth daily., Disp: 30 tablet, Rfl: 5   montelukast  (SINGULAIR ) 10 MG tablet, Take 1 tablet (10 mg total) by mouth at bedtime., Disp: 90 tablet, Rfl: 1   Multiple Vitamins-Minerals (PRESERVISION AREDS 2+MULTI VIT) CAPS, Take 1 tablet by mouth daily., Disp: , Rfl:    pregabalin  (LYRICA ) 100 MG capsule, Take 1 capsule (100 mg total) by mouth at bedtime., Disp: 90 capsule, Rfl: 1   Probiotic Product (PROBIOTIC PO), Take 1 capsule by mouth  daily., Disp: , Rfl:    sulfaSALAzine (AZULFIDINE) 500 MG EC tablet, Take 500 mg by mouth 2 (two) times daily., Disp: , Rfl:    Physical Exam:   BP (!) 164/73 (BP Location: Left Arm, Patient Position: Sitting, Cuff Size: Normal)   Pulse 65   Wt 115 lb (52.2 kg)   SpO2 97%   BMI 22.46 kg/m   The patient was awake, alert, and appropriate. The external ears were inspected, and otoscopy was performed to evaluate the external auditory canals and tympanic membranes. The nasal cavity and septum were examined for mucosal changes, obstruction, or discharge. The oral cavity and oropharynx were inspected for mucosal lesions, infection, or tonsillar hypertrophy. The neck was palpated for lymphadenopathy, thyroid  abnormalities, or other masses. Cranial nerve function was grossly intact.  Pertinent Findings: Physical Exam HEENT: External ears with no lesions. Tympanic membrane with normal landmarks. Nose normal. Oral cavity normal. NECK: Neck normal, no abnormalities on palpation. Strained voice, voice breaks   Seprately Identifiable Procedures:  I personally ordered, reviewed and interpreted the following with the patient today  Procedure: Bilateral ear microscopy and cerumen removal using microscope (CPT [id number]) - Mod 25 Pre-procedure diagnosis: Cerumen impaction external  ear/ears Post-procedure diagnosis: same Indication:  cerumen impaction; given patient's otologic complaints and history as well as for improved and comprehensive examination of external ear and tympanic membrane, bilateral otologic examination using microscope was performed and impacted cerumen removed  Procedure: Patient was placed semi-recumbent. Both ear canals were examined using the microscope with findings above. Cerumen removed on left and on right using suction and currette with improvement in EAC examination and patency. Left: EAC was patent. TM was intact . Middle ear was aerated. Drainage: absent Right: EAC was patent. TM was intact . Middle ear was aerated . Drainage: absent Patient tolerated the procedure well.   Impression & Plans:  Margaret [name] is a 89 y.o. female  1. Impacted cerumen of left ear   2. Sensorineural hearing loss (SNHL) of both ears   3. Does use hearing aid   4. Muscle tension dysphonia   - Findings and diagnoses discussed in detail with the patient. - Risks, benefits, and alternatives were reviewed. Through shared decision making, the patient elects to proceed with below. Assessment & Plan Impacted cerumen, bilateral, chronic and severe exacerbation Bilateral impacted cerumen causing hearing difficulties. Successful removal with suction technique. - Scheduled annual ear cleaning with ENT. - Discontinued ear drops. - Offered hearing test at ENT if desired.  - Discussed potential speech therapy for voice strain if bothersome. Also discussed FFL. Patient and daughter do not have an issue with strained voice and breaks. Educated that botox injection could be helpful. Declined ffl. No dysphagia.   - Orders placed:  Orders Placed This Encounter  Procedures   Ambulatory referral to Audiology   - Medications prescribed/continued/adjusted: No orders of the defined types were placed in this encounter.  - Education materials provided to the patient. - Follow  up: after audio, consider ffl. Patient instructed to return sooner or go to the ED if new/worsening symptoms develop.   Thank you for allowing me the opportunity to care for your patient. Please do not hesitate to contact me should you have any other questions.  Sincerely, Penne Croak, DO Otolaryngologist (ENT) [facility] Phone: [phone] Fax: [phone]  01/24/2024, 9:58 AM

## 2024-01-25 ENCOUNTER — Ambulatory Visit

## 2024-02-13 ENCOUNTER — Other Ambulatory Visit: Payer: Self-pay

## 2024-02-13 MED ORDER — MONTELUKAST SODIUM 10 MG PO TABS: 10.0000 mg | ORAL_TABLET | Freq: Every day | ORAL | 0 refills | Status: AC

## 2024-02-21 ENCOUNTER — Telehealth: Payer: Self-pay

## 2024-02-21 NOTE — Telephone Encounter (Signed)
 Communication  Reason for CRM: Patient lives with someone that has just been diagnosed witht he flu. She is snow experiencing a sore throat. Her daughter Mliss would like to know if she could be prescribed tamiflu to decrease the chances of her getting sicker since she's 89 years old?   Pt scheduled for appt.

## 2024-02-21 NOTE — Progress Notes (Unsigned)
 "      Margaret [name] , 1929/09/20, 89 y.o., female MRN: [id number] Patient Care Team    Relationship Specialty Notifications Start End  Catherine Charlies LABOR, DO PCP - General Family Medicine  03/08/19   Lonni Slain, MD PCP - Cardiology Cardiology  04/30/22   Wendel Lurena POUR, MD PCP - Structural Heart Cardiology  07/13/23   Valdemar Rogue, MD Consulting Physician Ophthalmology  03/29/18   Lelon JONELLE Ferrari, MD Consulting Physician Ophthalmology  03/29/18   Tricia Tawni CROME, MD Referring Physician Dermatology  08/02/19   Ishmael Slough, MD Consulting Physician Rheumatology  12/16/22     No chief complaint on file.    Subjective: Margaret [name] is a 89 y.o. Pt presents for an OV with complaints of *** of *** duration.  Associated symptoms include ***.  Pt has tried *** to ease their symptoms.      08/04/2023    3:05 PM 08/04/2023   11:25 AM 05/04/2023   11:40 AM 01/12/2023    3:05 PM 12/16/2022   10:04 AM  Depression screen PHQ 2/9  Decreased Interest 0 0 0 0 0  Down, Depressed, Hopeless 0 0 0 0 0  PHQ - 2 Score 0 0 0 0 0  Altered sleeping 0 0 1 1   Tired, decreased energy 1 1 1 1    Change in appetite 0 0 1 0   Feeling bad or failure about yourself  0 0 0 0   Trouble concentrating 1 1 0 0   Moving slowly or fidgety/restless 1 1 0 0   Suicidal thoughts 0 0 0 0   PHQ-9 Score 3  3  3  2     Difficult doing work/chores Somewhat difficult Somewhat difficult Not difficult at all Not difficult at all      Data saved with a previous flowsheet row definition    Allergies[1] Social History   Social History Narrative   Marital status/children/pets: Widowed.  Moved to [place]  from [place]  [facility] area) 2019.  She shares a home with 1 of her daughters.  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   Cervical radiculopathy 05/16/2015   DDD C4-C5, C5-C6, and C6-C7.  Facet arthropathy throughout cervical spine.  Normal foraminal narrowing secondary to uncovertebral  arthropathy is seen bilaterally at C3-C4 and C5/C6   DDD (degenerative disc disease), lumbar 04/26/2015   Mild levoscoliosis, right anterior listhesis of L4 and L5.  DDD at every level.  Facet arthropathy at multiple levels.   Glaucoma    both eyes, mild opened angle   History of colon polyps    History of frequent urinary tract infections    klebs- pansensitive. c. 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 Shyrl and Dr Wendel   Syncope 05/17/2017   Thyroid  disease    Urinary incontinence    Vertigo    had been prescribed antivert   Vitamin D  deficiency    Past Surgical History:  Procedure Laterality Date   ABDOMINAL AORTOGRAM N/A 04/26/2023   Procedure: ABDOMINAL AORTOGRAM;  Surgeon: Thukkani, Arun K, MD;  Location: [facility];  Service: Cardiovascular;  Laterality: N/A;   CARPAL TUNNEL RELEASE Bilateral 2019   CATARACT EXTRACTION Bilateral 1997   COLONOSCOPY  2010   INTRAOPERATIVE TRANSTHORACIC  ECHOCARDIOGRAM N/A 07/12/2023   Procedure: ECHOCARDIOGRAM, TRANSTHORACIC;  Surgeon: Wendel Lurena POUR, MD;  Location: [facility];  Service: Cardiovascular;  Laterality: N/A;   NASAL SEPTUM SURGERY  2007   RIGHT HEART CATH AND CORONARY ANGIOGRAPHY N/A 04/26/2023   Procedure: RIGHT HEART CATH AND CORONARY ANGIOGRAPHY;  Surgeon: Wendel Lurena POUR, MD;  [facility]: [facility];  Service: Cardiovascular;  Laterality: N/A;   WRIST FRACTURE SURGERY Right 1996   Family History  Problem Relation Age of Onset   Arthritis Mother    COPD Mother    Prostate cancer Father    Arthritis Father    Hearing loss Father    Heart disease Father    Heart attack Father    Stroke Maternal Grandmother    Liver cancer Sister    Arthritis Brother    Prostate cancer Brother    COPD Brother    Heart disease Brother    Breast cancer Daughter    Blindness Maternal Grandfather    Stroke Maternal Grandfather    COPD Paternal Grandmother    Breast cancer Paternal Grandmother    Allergies as of 02/22/2024       Reactions   Augmentin  [amoxicillin -pot Clavulanate] Swelling, Rash   Swelling in face   Lactose Intolerance (gi) Diarrhea   bloating   Penicillin G Swelling   swelling of face        Medication List        Accurate as of February 21, 2024 11:04 AM. If you have any questions, ask your nurse or doctor.          acetaminophen  500 MG tablet Commonly known as: TYLENOL  Take 500 mg by mouth 2 (two) times daily.   albuterol  108 (90 Base) MCG/ACT inhaler Commonly known as: VENTOLIN  HFA Inhale 2 puffs into the lungs every 6 (six) hours as needed for wheezing or shortness of breath.   amLODipine  2.5 MG tablet Commonly known as: NORVASC  Take 1 tablet (2.5 mg total) by mouth daily.   aspirin  81 MG chewable tablet Chew 1 tablet (81 mg total) by mouth daily.   CALCIUM 600 + D PO Take 1 tablet by mouth daily.   cycloSPORINE  0.05 % ophthalmic emulsion Commonly known as:  RESTASIS  Place 1 drop into both eyes 2 (two) times daily.   Debrox 6.5 % OTIC solution Generic drug: carbamide peroxide 2 x weekly   diclofenac Sodium 1 % Gel Commonly known as:  VOLTAREN Apply 1 Application topically 2 (two) times daily.   fluocinonide  ointment 0.05 % Commonly known as: LIDEX  Apply 1 Application topically 2 (two) times daily.   latanoprost 0.005 % ophthalmic solution Commonly known as: XALATAN 1 drop at bedtime.   leflunomide  10 MG tablet Commonly known as: ARAVA  Take 10 mg by mouth daily.   levocetirizine 5 MG tablet Commonly known as: XYZAL Take 5 mg by mouth daily as needed for allergies.   levothyroxine  50 MCG tablet Commonly known as: SYNTHROID  Take 1 tablet (50 mcg total) by mouth daily before breakfast.   mirabegron  ER 25 MG Tb24 tablet Commonly known as: MYRBETRIQ  Take 1 tablet (25 mg total) by mouth daily.   montelukast  10 MG tablet Commonly known as: SINGULAIR  Take 1 tablet (10 mg total) by mouth at bedtime.   pregabalin  100 MG capsule Commonly known as: LYRICA  Take 1 capsule (100 mg total) by mouth at bedtime.   PreserVision AREDS 2+Multi Vit Caps Take 1 tablet by mouth daily.   PROBIOTIC PO Take 1 capsule by mouth daily.   sulfaSALAzine 500 MG EC tablet Commonly known as: AZULFIDINE Take 500 mg by mouth 2 (two) times daily.        All past medical history, surgical history, allergies, family history, immunizations andmedications were updated in the EMR today and reviewed under the history and medication portions of their EMR.     ROS Negative, with the exception of above mentioned in HPI   Objective:  There were no vitals taken for this visit. There is no height or weight on file to calculate BMI.  Physical Exam   No results found. No results found. No results found for this or any previous visit (from the past 24 hours).  Assessment/Plan: Margaret [name] is a 89 y.o. female present for OV for  *** Reviewed  expectations re: course of current medical issues. Discussed self-management of symptoms. Outlined signs and symptoms indicating need for more acute intervention. Patient verbalized understanding and all questions were answered. Patient received an After-Visit Summary.    No orders of the defined types were placed in this encounter.  No orders of the defined types were placed in this encounter.  Referral Orders  No referral(s) requested today     Note is dictated utilizing voice recognition software. Although note has been proof read prior to signing, occasional typographical errors still can be missed. If any questions arise, please do not hesitate to call for verification.   electronically signed by:  Charlies Bellini, DO  [facility] [place]       [1]  Allergies Allergen Reactions   Augmentin  [Amoxicillin -Pot Clavulanate] Swelling and Rash    Swelling in face   Lactose Intolerance (Gi) Diarrhea    bloating   Penicillin G Swelling    swelling of face   "

## 2024-02-22 ENCOUNTER — Ambulatory Visit (INDEPENDENT_AMBULATORY_CARE_PROVIDER_SITE_OTHER): Admitting: Family Medicine

## 2024-02-22 ENCOUNTER — Encounter: Payer: Self-pay | Admitting: Family Medicine

## 2024-02-22 VITALS — BP 110/64 | HR 66 | Temp 97.5°F | Wt 117.4 lb

## 2024-02-22 DIAGNOSIS — J029 Acute pharyngitis, unspecified: Secondary | ICD-10-CM

## 2024-02-22 DIAGNOSIS — Z20828 Contact with and (suspected) exposure to other viral communicable diseases: Secondary | ICD-10-CM | POA: Diagnosis not present

## 2024-02-22 DIAGNOSIS — D849 Immunodeficiency, unspecified: Secondary | ICD-10-CM

## 2024-02-22 DIAGNOSIS — J101 Influenza due to other identified influenza virus with other respiratory manifestations: Secondary | ICD-10-CM | POA: Diagnosis not present

## 2024-02-22 MED ORDER — OSELTAMIVIR PHOSPHATE 75 MG PO CAPS: 75.0000 mg | ORAL_CAPSULE | Freq: Two times a day (BID) | ORAL | 0 refills | Status: AC

## 2024-02-22 MED ORDER — BENZONATATE 200 MG PO CAPS: 200.0000 mg | ORAL_CAPSULE | Freq: Two times a day (BID) | ORAL | 0 refills | Status: AC | PRN

## 2024-02-22 NOTE — Patient Instructions (Addendum)
 Return in about 1 week (around 02/29/2024), or if symptoms worsen or fail to improve.        Great to see you today.  I have refilled the medication(s) we provide.   If labs were collected or images ordered, we will inform you of  results once we have received them and reviewed. We will contact you either by echart message, or telephone call.  Please give ample time to the testing facility, and our office to run,  receive and review results. Please do not call inquiring of results, even if you can see them in your chart. We will contact you as soon as we are able. If it has been over 1 week since the test was completed, and you have not yet heard from us , then please call us .    - echart message- for normal results that have been seen by the patient already.   - telephone call: abnormal results or if patient has not viewed results in their echart.  If a referral to a specialist was entered for you, please call us  in 2 weeks if you have not heard from the specialist office to schedule.

## 2024-03-05 NOTE — Progress Notes (Signed)
 " [facility] - Clinic Note  03/09/2024    CHIEF COMPLAINT Patient presents for Retina Follow Up   HISTORY OF PRESENT ILLNESS: Margaret [name] is a 89 y.o. female who presents to the clinic today for:   HPI     Retina Follow Up   Patient presents with  Wet AMD.  In both eyes.  This started 12 weeks ago.  Duration of 12 weeks.  Since onset it is stable.  I, the attending physician,  performed the HPI with the patient and updated documentation appropriately.        Comments   12 week retina follow up AMD and IVV OS. Pt c/o VA slowly getting worse. Pt has a hard time focusing. Pt denies FOL/flotaers. Pt consistent with Restasis . Pt's daughter uses Xiidra and would like to see if it would help her.       Last edited by Valdemar Rogue, MD on 03/09/2024 11:57 PM.     Referring physician: Catherine Charlies LABOR, DO [address] [place],  [place] [phone]  HISTORICAL INFORMATION:  Selected notes from the MEDICAL RECORD NUMBER Self referral for macular degeneration LEE: 03.21.19 (Dr. Susie in [place], [place]) [BCVA: OD: 20/40 OS: 20/40] Ocular Hx-POAG, non-exu ARMD, DES, pseudo, YAG PMH-astham, arthritis,    CURRENT MEDICATIONS: Current Outpatient Medications (Ophthalmic Drugs)  Medication Sig   cycloSPORINE  (RESTASIS ) 0.05 % ophthalmic emulsion Place 1 drop into both eyes 2 (two) times daily.   latanoprost (XALATAN) 0.005 % ophthalmic solution 1 drop at bedtime.   No current facility-administered medications for this visit. (Ophthalmic Drugs)   Current Outpatient Medications (Other)  Medication Sig   acetaminophen  (TYLENOL ) 500 MG tablet Take 500 mg by mouth 2 (two) times daily.   albuterol  (VENTOLIN  HFA) 108 (90 Base) MCG/ACT inhaler Inhale 2 puffs into the lungs every 6 (six) hours as needed for wheezing or shortness of breath.   amLODipine  (NORVASC ) 2.5 MG tablet Take 1 tablet (2.5 mg total) by mouth daily.   aspirin  81 MG chewable tablet Chew 1 tablet (81 mg total) by  mouth daily.   benzonatate  (TESSALON ) 200 MG capsule Take 1 capsule (200 mg total) by mouth 2 (two) times daily as needed for cough.   Calcium Carb-Cholecalciferol (CALCIUM 600 + D PO) Take 1 tablet by mouth daily.   carbamide peroxide (DEBROX) 6.5 % OTIC solution 2 x weekly   diclofenac Sodium (VOLTAREN) 1 % GEL Apply 1 Application topically 2 (two) times daily.   fluocinonide  ointment (LIDEX ) 0.05 % Apply 1 Application topically 2 (two) times daily. (Patient not taking: Reported on 03/14/2024)   leflunomide  (ARAVA ) 10 MG tablet Take 10 mg by mouth daily.   levocetirizine (XYZAL) 5 MG tablet Take 5 mg by mouth daily as needed for allergies.   levothyroxine  (SYNTHROID ) 50 MCG tablet Take 1 tablet (50 mcg total) by mouth daily before breakfast.   mirabegron  ER (MYRBETRIQ ) 25 MG TB24 tablet Take 1 tablet (25 mg total) by mouth daily. (Patient not taking: Reported on 03/14/2024)   montelukast  (SINGULAIR ) 10 MG tablet Take 1 tablet (10 mg total) by mouth at bedtime.   Multiple Vitamins-Minerals (PRESERVISION AREDS 2+MULTI VIT) CAPS Take 1 tablet by mouth daily.   pregabalin  (LYRICA ) 100 MG capsule Take 1 capsule (100 mg total) by mouth at bedtime.   Probiotic Product (PROBIOTIC PO) Take 1 capsule by mouth daily.   sulfaSALAzine (AZULFIDINE) 500 MG EC tablet Take 500 mg by mouth 2 (two) times daily.  No current facility-administered medications for this visit. (Other)   REVIEW OF SYSTEMS:    ALLERGIES Allergies  Allergen Reactions   Augmentin  [Amoxicillin -Pot Clavulanate] Swelling and Rash    Swelling in face   Lactose Intolerance (Gi) Diarrhea    bloating   Penicillin G Swelling    swelling of face   PAST MEDICAL HISTORY Past Medical History:  Diagnosis Date   Allergy    Asthma    Carotid artery stenosis 10/17/2012   stable report in 2018- moderate on right, minimal on left   Carpal tunnel syndrome 2018   left.   Cervical radiculopathy 05/16/2015   DDD C4-C5, C5-C6, and C6-C7.   Facet arthropathy throughout cervical spine.  Normal foraminal narrowing secondary to uncovertebral  arthropathy is seen bilaterally at C3-C4 and C5/C6   DDD (degenerative disc disease), lumbar 04/26/2015   Mild levoscoliosis, right anterior listhesis of L4 and L5.  DDD at every level.  Facet arthropathy at multiple levels.   Glaucoma    both eyes, mild opened angle   History of colon polyps    History of frequent urinary tract infections    klebs- pansensitive. c. 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 Shyrl and Dr Wendel   Syncope 05/17/2017   Thyroid  disease    Urinary incontinence    Vertigo    had been prescribed antivert   Vitamin D  deficiency    Past Surgical History:  Procedure Laterality Date   ABDOMINAL AORTOGRAM N/A 04/26/2023   Procedure: ABDOMINAL AORTOGRAM;  Surgeon: Wendel Lurena POUR, MD;  Location: [facility];  Service: Cardiovascular;  Laterality: N/A;   CARPAL TUNNEL RELEASE Bilateral 2019   CATARACT EXTRACTION Bilateral 1997   COLONOSCOPY  2010   INTRAOPERATIVE TRANSTHORACIC ECHOCARDIOGRAM N/A 07/12/2023   Procedure: ECHOCARDIOGRAM, TRANSTHORACIC;  Surgeon: Wendel Lurena POUR, MD;  Location: [facility];  Service: Cardiovascular;  Laterality: N/A;   NASAL SEPTUM SURGERY  2007   RIGHT HEART CATH AND CORONARY ANGIOGRAPHY N/A 04/26/2023   Procedure: RIGHT HEART CATH AND CORONARY ANGIOGRAPHY;  Surgeon: Wendel Lurena POUR, MD;  Location: [facility];  Service: Cardiovascular;  Laterality: N/A;   WRIST FRACTURE SURGERY Right 1996   FAMILY HISTORY Family History  Problem Relation Age of Onset   Arthritis Mother    COPD Mother    Prostate cancer Father    Arthritis Father    Hearing loss Father    Heart disease Father    Heart attack Father    Stroke Maternal Grandmother    Liver cancer Sister    Arthritis Brother    Prostate cancer Brother    COPD Brother    Heart disease Brother    Breast cancer Daughter    Blindness Maternal Grandfather    Stroke Maternal Grandfather  COPD Paternal Grandmother    Breast cancer Paternal Grandmother    SOCIAL HISTORY Social History   Tobacco Use   Smoking status: Former    Current packs/day: 0.00    Average packs/day: 0.5 packs/day for 13.0 years (6.5 ttl pk-yrs)    Types: Cigarettes    Start date: 37    Quit date: 1965    Years since quitting: 61.1   Smokeless tobacco: Never  Vaping Use   Vaping status: Never Used  Substance Use Topics   Alcohol  use: Not Currently    Alcohol /week: 5.0 standard drinks of alcohol     Types: 5 Standard drinks or equivalent per week   Drug use: Never       OPHTHALMIC EXAM:  Base Eye Exam     Visual Acuity (Snellen - Linear)       Right Left   Dist cc 20/40 -2 20/30 -2   Dist ph cc NI NI    Correction: Glasses         Tonometry (Tonopen, 12:37 PM)       Right Left   Pressure 14 16         Pupils       Pupils Dark Light Shape React APD   Right PERRL 3 2 Round Brisk None   Left PERRL 3 2 Round Brisk None         Visual Fields       Left Right    Full Full         Extraocular Movement       Right Left    Full, Ortho Full, Ortho         Neuro/Psych     Oriented x3: Yes   Mood/Affect: Normal         Dilation     Both eyes: 1.0% Mydriacyl, 2.5% Phenylephrine @ 12:37 PM           Slit Lamp and Fundus Exam     External Exam       Right Left    External Normal Normal         Slit Lamp Exam       Right Left   Lids/Lashes Dermatochalasis - upper lid, Telangiectasia Dermatochalasis - upper lid, mild Meibomian gland dysfunction   Conjunctiva/Sclera Temporal Pinguecula White and quiet   Cornea Arcus, 1+ Punctate epithelial erosions, mild tear film debris, fine endo pigment Arcus, 1-2+ inferior Punctate epithelial erosions, mild tear film debris, fine endo pigment   Anterior Chamber Deep and quiet, narrow temporal angle Deep and quiet   Iris Round and dilated Round and dilated, pigmented mass at 0200 and 0730 behind iris, anterior to IOL -- ?regression of 0200 lesion   Lens PC IOL in good position with open PC PC IOL in good position with open PC   Anterior Vitreous Vitreous syneresis Vitreous syneresis, Posterior vitreous detachment         Fundus Exam       Right Left   Disc 360 Peripapillary atrophy, Sharp rim, 2+ Pallor sharp rim, temporal PPA, mild Pallor   C/D Ratio 0.3 0.3   Macula Flat, Blunted foveal reflex, drusen, RPE mottling and clumping, trace cystic changes -- stably improved, no heme, early RPE atrophy Flat, Blunted foveal reflex, drusen, RPE mottling, clumping and early atrophy, +CNV w/ cystic changes/edema, no heme   Vessels attenuated, Tortuous attenuated, Tortuous   Periphery Attached, scattered Reticular degeneration, pigmented cystoid degeneration temporally, No heme Attached, scattered Reticular degeneration, No  heme           Refraction     Wearing Rx       Sphere Cylinder Axis Add   Right -0.75 +2.25 175 +2.75   Left -0.25 +1.25 165 +2.75           IMAGING AND PROCEDURES  Imaging and Procedures for @TODAY @  OCT, Retina - OU - Both Eyes       Right Eye Quality was good. Central Foveal Thickness: 283. Progression has been stable. Findings include normal foveal contour, no IRF, no SRF, retinal drusen , intraretinal hyper-reflective material, epiretinal membrane, outer retinal atrophy  (Stable improvement in trace cystic changes temporal fovea, no fluid).   Left Eye Quality was good. Central Foveal Thickness: 352. Progression has worsened. Findings include no SRF, abnormal foveal contour, retinal drusen , subretinal hyper-reflective material, intraretinal fluid, pigment epithelial detachment, outer retinal atrophy (Interval development of IRF/edema overlying new PED/CNV temporal fovea ).   Notes *Images captured and stored on drive  Diagnosis / Impression:  OD: Exudative ARMD - Stable improvement in trace cystic changes temporal fovea, no fluid OS: Exudative ARMD OS - Interval development of IRF/edema overlying new PED/CNV temporal fovea  Clinical management:  See below  Abbreviations: NFP - Normal foveal profile. CME - cystoid macular edema. PED - pigment epithelial detachment. IRF - intraretinal fluid. SRF - subretinal fluid. EZ - ellipsoid zone. ERM - epiretinal membrane. ORA - outer retinal atrophy. ORT - outer retinal tubulation. SRHM - subretinal hyper-reflective material       Intravitreal Injection, Pharmacologic Agent - OS - Left Eye       Time Out 03/09/2024. 1:05 PM. Confirmed correct patient, procedure, site, and patient consented.   Anesthesia Topical anesthesia was used. Anesthetic medications included Lidocaine  2%, Proparacaine 0.5%.   Procedure Preparation included 5% betadine to ocular surface, eyelid speculum. A (32g) needle was used.   Injection: 1.25 mg Bevacizumab  1.25mg /0.33ml   Route: Intravitreal, Site: Left Eye   NDC: [id number], Lot: [id number], Expiration date: 05/10/2024   Post-op Post injection exam found visual acuity of at least counting fingers. The patient tolerated the procedure well. There were no complications. The patient received written and verbal post procedure care education. Post injection medications were not given.            ASSESSMENT/PLAN:    ICD-10-CM   1. Exudative age-related macular degeneration of  right eye with active choroidal neovascularization (HCC)  H35.3211 OCT, Retina - OU - Both Eyes    2. Exudative age-related macular degeneration of left eye with active choroidal neovascularization (HCC)  H35.3221 OCT, Retina - OU - Both Eyes    Intravitreal Injection, Pharmacologic Agent - OS - Left Eye    Bevacizumab  (AVASTIN ) SOLN 1.25 mg    3. 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 Long-term use of Plaquenil   Z79.899     6. Pseudophakia of both eyes  Z96.1     7. Primary open angle glaucoma (POAG) of both eyes, mild stage  H40.1131     8. Dry eyes  H04.123      1. Exudative age related macular degeneration, OD  - conversion from nonexudative ARMD noted on 9.23.22 visit  - s/p IVA OD #1 (09.23.22), #2 (10.21.22), #3 (11.18.22), #4 (12.30.22), #5 (02.13.23), #6 (03.24.23), #7 (05.08.23), #8 (06.09.23) -- IVA resistance  - s/p IVE OD #1 (07.24.23), #2 (08.25.23), #3 (09.29.23), #  4 (10.27.23) -- IVE resistance  - s/p IVV OD #1 (12.01.23), #2 (01.05.34), #3 (02.02.24), #4 (03.01.24), #5 (03.29.24), #6 (04.26.24), #7 (05.24.24), #8 (06.28.24) #9 (08.16.24), #10 (10.18.24), #11 (12.20.24), #12 (02.27.25), #13 (05.16.25) #14 (08.01.25), #15 (10.24.25) - BCVA OD 20/40 from 20/30  - OCT shows OD: Stable improvement in trace cystic changes temporal fovea, no fluid at 12 weeks - recommend holding IVV OD today, 01.16 .26 w/ f/u in 4 weeks due to OS conversion (See below) - RBA of procedure discussed, questions answered - Vabysmo  informed consent obtained and signed, 12.01.23 - see procedure note - f/u in 4 weeks (16 wks post-IVV) -- DFE/OCT, possible injection   2. Exudative age related macular degeneration, OS  - Conversion to exu AMD noted on 01.16.26 visit -- new IRF/edema overlying new CNV  - The incidence pathology and anatomy of wet AMD discussed   - The ANCHOR, MARINA , CATT and VIEW trials discussed with patient.    - discussed treatment  options including observation vs intravitreal anti-VEGF agents such as Avastin , Lucentis, Eylea .    - Risks of endophthalmitis and vascular occlusive events and atrophic changes discussed with patient  - BCVA 20/30 stable  - Exam shows +CNV w/ cystic changes/edema-no heme  - OCT shows OS: interval development of IRF/edema overlying new PED/CNV temporal fovea  - recommend IVA OS #1 today, 01.16.26 w/ f/u in 4 weeks  - pt wishes to be treated with IVA  - RBA of procedure discussed, questions answered - informed consent obtained and signed IVA OS 01.16.26 - see procedure note   - f/u in 4 wks, DFE, OCT   3. PVD / vitreous syneresis OD  - Discussed findings and prognosis  - No RT or RD on 360 scleral depressed exam  - Reviewed s/s of RT/RD  - Strict return precautions for any such RT/RD signs/symptoms  - f/u in 3-4 wks -- DFE/OCT  4. Iris/ciliary body mass OS - pigmented lesion behind iris but anterior to PCIOL at 0730 and 0300 - 0730 lesion spans 0630 to 0830 behind dilated iris and larger than 0300 lesion  - patient asymptomatic - pt saw Dr. Mort, [facility] on 11.01.22 who dx her with iris margin cyst  5. Plaquenil  use for RA  - baseline testing done 05.20.20 -- started plaquenil  early May 2020 for RA - HVF 10-2 showed non-specific defects OU -- essentially normal baseline test - OCT shows no obvious plaquenil -related changes, but interval development of exudative ARMD OD as above - currently taking 200mg  / day per Dr. Carmine instructions --> 3.29 mg/kg body wt per day  - po prednisone , currently as needed which is about once a week - and is also now on 10mg  po MTX weekly + folic acid  - the American Academy of Ophthalmology recommends dosing 5mg /kg per day to reduce risk of retinal toxicity  - recommend consideration of using alternate medication for RA especially given co-morbidity of age-related macular degeneration OU  - managed by Dr. Leni at [facility]  6. Pseudophakia OU - s/p CE/IOL w/ ?canaloplasty OU by Dr[facility] Laurey at [facility]  - doing well  - continue to monitor  7. History of Glaucoma   - Managed by Dr. Marylouise latanoprost at bedtime OU - ?underwent canaloplasty in conjunction with CEIOL at [facility] clinic   8. Dry Eye Syndrome  - use AT's OU as needed  Ophthalmic Meds Ordered this visit:  Meds ordered this encounter  Medications  Bevacizumab  (AVASTIN ) SOLN 1.25 mg     This document serves as a record of services personally performed by Redell JUDITHANN Hans, MD, PhD. It was created on their behalf by Delon Newness COT, an ophthalmic technician. The creation of this record is the provider's dictation and/or activities during the visit.    Electronically signed by: Delon Newness COT 01.12.26 2:36 PM  This document serves as a record of services personally performed by Redell JUDITHANN Hans, MD, PhD. It was created on their behalf by Almetta Pesa, an ophthalmic technician. The creation of this record is the provider's dictation and/or activities during the visit.    Electronically signed by: Almetta Pesa, OA, 03/19/24  2:36 PM  Redell JUDITHANN Hans, M.D., Ph.D. Diseases & Surgery of the Retina and Vitreous [facility] 03/09/2024   I have reviewed the above documentation for accuracy and completeness, and I agree with the above. Redell JUDITHANN Hans, M.D., Ph.D. 03/19/24 2:39 PM    Abbreviations: M myopia (nearsighted); A astigmatism; H hyperopia (farsighted); P presbyopia; Mrx spectacle prescription;  CTL contact lenses; OD right eye; OS left eye; OU both eyes  XT exotropia; ET esotropia; PEK punctate epithelial keratitis; PEE punctate epithelial erosions; DES dry eye syndrome; MGD meibomian gland dysfunction; ATs artificial tears; PFAT's preservative free artificial tears; NSC nuclear sclerotic cataract; PSC posterior subcapsular cataract; ERM epi-retinal membrane;  PVD posterior vitreous detachment; RD retinal detachment; DM diabetes mellitus; DR diabetic retinopathy; NPDR non-proliferative diabetic retinopathy; PDR proliferative diabetic retinopathy; CSME clinically significant macular edema; DME diabetic macular edema; dbh dot blot hemorrhages; CWS cotton wool spot; POAG primary open angle glaucoma; C/D cup-to-disc ratio; HVF humphrey visual field; GVF goldmann visual field; OCT optical coherence tomography; IOP intraocular pressure; BRVO Branch retinal vein occlusion; CRVO central retinal vein occlusion; CRAO central retinal artery occlusion; BRAO branch retinal artery occlusion; RT retinal tear; SB scleral buckle; PPV pars plana vitrectomy; VH Vitreous hemorrhage; PRP panretinal laser photocoagulation; IVK intravitreal kenalog; VMT vitreomacular traction; MH Macular hole;  NVD neovascularization of the disc; NVE neovascularization elsewhere; AREDS age related eye disease study; ARMD age related macular degeneration; POAG primary open angle glaucoma; EBMD epithelial/anterior basement membrane dystrophy; ACIOL anterior chamber intraocular lens; IOL intraocular lens; PCIOL posterior chamber intraocular lens; Phaco/IOL phacoemulsification with intraocular lens placement; PRK photorefractive keratectomy; LASIK laser assisted in situ keratomileusis; HTN hypertension; DM diabetes mellitus; COPD chronic obstructive pulmonary disease "

## 2024-03-09 ENCOUNTER — Ambulatory Visit (INDEPENDENT_AMBULATORY_CARE_PROVIDER_SITE_OTHER): Admitting: Ophthalmology

## 2024-03-09 ENCOUNTER — Encounter (INDEPENDENT_AMBULATORY_CARE_PROVIDER_SITE_OTHER): Payer: Self-pay | Admitting: Ophthalmology

## 2024-03-09 DIAGNOSIS — H04123 Dry eye syndrome of bilateral lacrimal glands: Secondary | ICD-10-CM

## 2024-03-09 DIAGNOSIS — D4989 Neoplasm of unspecified behavior of other specified sites: Secondary | ICD-10-CM

## 2024-03-09 DIAGNOSIS — H43811 Vitreous degeneration, right eye: Secondary | ICD-10-CM | POA: Diagnosis not present

## 2024-03-09 DIAGNOSIS — H401131 Primary open-angle glaucoma, bilateral, mild stage: Secondary | ICD-10-CM

## 2024-03-09 DIAGNOSIS — H353231 Exudative age-related macular degeneration, bilateral, with active choroidal neovascularization: Secondary | ICD-10-CM

## 2024-03-09 DIAGNOSIS — Z961 Presence of intraocular lens: Secondary | ICD-10-CM | POA: Diagnosis not present

## 2024-03-09 DIAGNOSIS — Z79899 Other long term (current) drug therapy: Secondary | ICD-10-CM | POA: Diagnosis not present

## 2024-03-09 DIAGNOSIS — H353221 Exudative age-related macular degeneration, left eye, with active choroidal neovascularization: Secondary | ICD-10-CM

## 2024-03-09 DIAGNOSIS — H353122 Nonexudative age-related macular degeneration, left eye, intermediate dry stage: Secondary | ICD-10-CM

## 2024-03-09 DIAGNOSIS — H353211 Exudative age-related macular degeneration, right eye, with active choroidal neovascularization: Secondary | ICD-10-CM

## 2024-03-09 MED ORDER — BEVACIZUMAB CHEMO INJECTION 1.25MG/0.05ML SYRINGE FOR KALEIDOSCOPE
1.2500 mg | INTRAVITREAL | Status: AC | PRN
  Administered 2024-03-09: 1.25 mg via INTRAVITREAL

## 2024-03-14 ENCOUNTER — Ambulatory Visit

## 2024-03-14 VITALS — BP 148/78 | HR 64 | Ht 60.0 in | Wt 117.8 lb

## 2024-03-14 DIAGNOSIS — Z0000 Encounter for general adult medical examination without abnormal findings: Secondary | ICD-10-CM | POA: Diagnosis not present

## 2024-03-14 NOTE — Patient Instructions (Signed)
 Ms. Linan,  Thank you for taking the time for your Medicare Wellness Visit. I appreciate your continued commitment to your health goals. Please review the care plan we discussed, and feel free to reach out if I can assist you further.  Please note that Annual Wellness Visits do not include a physical exam. Some assessments may be limited, especially if the visit was conducted virtually. If needed, we may recommend an in-person follow-up with your provider.  Ongoing Care Seeing your primary care provider every 3 to 6 months helps us  monitor your health and provide consistent, personalized care. Last office visit on 02/22/2024.  You are due for a   Referrals If a referral was made during today's visit and you haven't received any updates within two weeks, please contact the referred provider directly to check on the status.  Recommended Screenings:  Health Maintenance  Topic Date Due   Medicare Annual Wellness Visit  01/12/2024   DTaP/Tdap/Td vaccine (2 - Td or Tdap) 02/23/2024   Pneumococcal Vaccine for age over 2  Completed   Flu Shot  Completed   Osteoporosis screening with Bone Density Scan  Completed   Zoster (Shingles) Vaccine  Completed   Meningitis B Vaccine  Aged Out   Breast Cancer Screening  Discontinued   COVID-19 Vaccine  Discontinued       07/12/2023    7:41 AM  Advanced Directives  Does Patient Have a Medical Advance Directive? Yes  Type of Estate Agent of [place];Living will  Does patient want to make changes to medical advance directive? No - Guardian declined  Copy of Healthcare Power of Attorney in Chart? No - copy requested    Vision: Annual vision screenings are recommended for early detection of glaucoma, cataracts, and diabetic retinopathy. These exams can also reveal signs of chronic conditions such as diabetes and high blood pressure.  Dental: Annual dental screenings help detect early signs of oral cancer, gum disease, and other  conditions linked to overall health, including heart disease and diabetes.  Please see the attached documents for additional preventive care recommendations.

## 2024-03-14 NOTE — Progress Notes (Signed)
 "  Chief Complaint  Patient presents with   Medicare Wellness     Subjective:   Margaret [name] is a 89 y.o. female who presents for a Medicare Annual Wellness Visit.  Visit info / Clinical Intake: Medicare Wellness Visit Type:: Subsequent Annual Wellness Visit Persons participating in visit and providing information:: patient Medicare Wellness Visit Mode:: In-person (required for WTM) Interpreter Needed?: No Pre-visit prep was completed: yes AWV questionnaire completed by patient prior to visit?: no Living arrangements:: with family/others Patient's Overall Health Status Rating: very good Typical amount of pain: some Does pain affect daily life?: no Are you currently prescribed opioids?: no  Dietary Habits and Nutritional Risks How many meals a day?: 3 Eats fruit and vegetables daily?: yes Most meals are obtained by: preparing own meals In the last 2 weeks, have you had any of the following?: none Diabetic:: no  Functional Status Activities of Daily Living (to include ambulation/medication): Independent Ambulation: Independent with device- listed below Home Assistive Devices/Equipment: Eyeglasses Medication Administration: Independent Home Management (perform basic housework or laundry): Independent Manage your own finances?: yes Primary transportation is: driving Concerns about vision?: no *vision screening is required for WTM* Concerns about hearing?: (!) yes Uses hearing aids?: (!) yes Hear whispered voice?: yes  Fall Screening Falls in the past year?: 1 Number of falls in past year: 1 Was there an injury with Fall?: 0 Fall Risk Category Calculator: 2 Patient Fall Risk Level: Moderate Fall Risk  Fall Risk Patient at Risk for Falls Due to: Impaired balance/gait Fall risk Follow up: Falls evaluation completed; Falls prevention discussed  Home and Transportation Safety: All rugs have non-skid backing?: N/A, no rugs All stairs or steps have railings?: yes (inside  and outside) Grab bars in the bathtub or shower?: (!) no Have non-skid surface in bathtub or shower?: yes Good home lighting?: yes Regular seat belt use?: yes Hospital stays in the last year:: -- (unsure if it was in the last year)  Cognitive Assessment Difficulty concentrating, remembering, or making decisions? : yes Will 6CIT or Mini Cog be Completed: yes What year is it?: 0 points What month is it?: 0 points Give patient an address phrase to remember (5 components): [address], [place] what time is it?: 0 points Count backwards from 20 to 1: 0 points Say the months of the year in reverse: 0 points Repeat the address phrase from earlier: 4 points 6 CIT Score: 4 points  Advance Directives (For Healthcare) Does Patient Have a Medical Advance Directive?: Yes Does patient want to make changes to medical advance directive?: No - Guardian declined Type of Advance Directive: Healthcare Power of [place]; Living will; Out of facility DNR (pink MOST or yellow form) Copy of Healthcare Power of Attorney in Chart?: No - copy requested Copy of Living Will in Chart?: No - copy requested Out of facility DNR (pink MOST or yellow form) in Chart? (Ambulatory ONLY): No - copy requested  Reviewed/Updated  Reviewed/Updated: Reviewed All (Medical, Surgical, Family, Medications, Allergies, Care Teams, Patient Goals)    Allergies (verified) Augmentin  [amoxicillin -pot clavulanate], Lactose intolerance (gi), and Penicillin g   Current Medications (verified) Outpatient Encounter Medications as of 03/14/2024  Medication Sig   acetaminophen  (TYLENOL ) 500 MG tablet Take 500 mg by mouth 2 (two) times daily.   albuterol  (VENTOLIN  HFA) 108 (90 Base) MCG/ACT inhaler Inhale 2 puffs into the lungs every 6 (six) hours as needed for wheezing or shortness of breath.   amLODipine  (NORVASC ) 2.5 MG tablet Take  1 tablet (2.5 mg total) by mouth daily.   aspirin  81 MG chewable tablet Chew 1 tablet (81 mg total)  by mouth daily.   benzonatate  (TESSALON ) 200 MG capsule Take 1 capsule (200 mg total) by mouth 2 (two) times daily as needed for cough.   Calcium Carb-Cholecalciferol (CALCIUM 600 + D PO) Take 1 tablet by mouth daily.   carbamide peroxide (DEBROX) 6.5 % OTIC solution 2 x weekly   cycloSPORINE  (RESTASIS ) 0.05 % ophthalmic emulsion Place 1 drop into both eyes 2 (two) times daily.   diclofenac Sodium (VOLTAREN) 1 % GEL Apply 1 Application topically 2 (two) times daily.   latanoprost (XALATAN) 0.005 % ophthalmic solution 1 drop at bedtime.   leflunomide  (ARAVA ) 10 MG tablet Take 10 mg by mouth daily.   levocetirizine (XYZAL) 5 MG tablet Take 5 mg by mouth daily as needed for allergies.   levothyroxine  (SYNTHROID ) 50 MCG tablet Take 1 tablet (50 mcg total) by mouth daily before breakfast.   montelukast  (SINGULAIR ) 10 MG tablet Take 1 tablet (10 mg total) by mouth at bedtime.   Multiple Vitamins-Minerals (PRESERVISION AREDS 2+MULTI VIT) CAPS Take 1 tablet by mouth daily.   pregabalin  (LYRICA ) 100 MG capsule Take 1 capsule (100 mg total) by mouth at bedtime.   Probiotic Product (PROBIOTIC PO) Take 1 capsule by mouth daily.   sulfaSALAzine (AZULFIDINE) 500 MG EC tablet Take 500 mg by mouth 2 (two) times daily.   fluocinonide  ointment (LIDEX ) 0.05 % Apply 1 Application topically 2 (two) times daily. (Patient not taking: Reported on 03/14/2024)   mirabegron  ER (MYRBETRIQ ) 25 MG TB24 tablet Take 1 tablet (25 mg total) by mouth daily. (Patient not taking: Reported on 03/14/2024)   No facility-administered encounter medications on file as of 03/14/2024.    History: Past Medical History:  Diagnosis Date   Allergy    Asthma    Carotid artery stenosis 10/17/2012   stable report in 2018- moderate on right, minimal on left   Carpal tunnel syndrome 2018   left.   Cervical radiculopathy 05/16/2015   DDD C4-C5, C5-C6, and C6-C7.  Facet arthropathy throughout cervical spine.  Normal foraminal narrowing  secondary to uncovertebral  arthropathy is seen bilaterally at C3-C4 and C5/C6   DDD (degenerative disc disease), lumbar 04/26/2015   Mild levoscoliosis, right anterior listhesis of L4 and L5.  DDD at every level.  Facet arthropathy at multiple levels.   Glaucoma    both eyes, mild opened angle   History of colon polyps    History of frequent urinary tract infections    klebs- pansensitive. c. 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 Shyrl and Dr Wendel   Syncope 05/17/2017   Thyroid  disease    Urinary incontinence    Vertigo    had been prescribed antivert  Vitamin D  deficiency    Past Surgical History:  Procedure Laterality Date   ABDOMINAL AORTOGRAM N/A 04/26/2023   Procedure: ABDOMINAL AORTOGRAM;  Surgeon: Wendel Lurena POUR, MD;  Location: [facility];  Service: Cardiovascular;  Laterality: N/A;   CARPAL TUNNEL RELEASE Bilateral 2019   CATARACT EXTRACTION Bilateral 1997   COLONOSCOPY  2010   INTRAOPERATIVE TRANSTHORACIC ECHOCARDIOGRAM N/A 07/12/2023   Procedure: ECHOCARDIOGRAM, TRANSTHORACIC;  Surgeon: Wendel Lurena POUR, MD;  Location: [facility];  Service: Cardiovascular;  Laterality: N/A;   NASAL SEPTUM SURGERY  2007   RIGHT HEART CATH AND CORONARY ANGIOGRAPHY N/A 04/26/2023   Procedure: RIGHT HEART CATH AND CORONARY ANGIOGRAPHY;  Surgeon: Wendel Lurena POUR, MD;  Location: [facility];  Service: Cardiovascular;   Laterality: N/A;   WRIST FRACTURE SURGERY Right 1996   Family History  Problem Relation Age of Onset   Arthritis Mother    COPD Mother    Prostate cancer Father    Arthritis Father    Hearing loss Father    Heart disease Father    Heart attack Father    Stroke Maternal Grandmother    Liver cancer Sister    Arthritis Brother    Prostate cancer Brother    COPD Brother    Heart disease Brother    Breast cancer Daughter    Blindness Maternal Grandfather    Stroke Maternal Grandfather    COPD Paternal Grandmother    Breast cancer Paternal Grandmother    Social History   Occupational History   Occupation: retired  Tobacco Use   Smoking status: Former    Current packs/day: 0.00    Average packs/day: 0.5 packs/day for 13.0 years (6.5 ttl pk-yrs)    Types: Cigarettes    Start date: 61    Quit date: 1965    Years since quitting: 61.0   Smokeless tobacco: Never  Vaping Use   Vaping status: Never Used  Substance and Sexual Activity   Alcohol  use: Not Currently    Alcohol /week: 5.0 standard drinks of alcohol     Types: 5 Standard drinks or equivalent per week   Drug use: Never   Sexual activity: Not Currently    Partners: Male   Tobacco Counseling Counseling given: Not Answered  SDOH Screenings   Food Insecurity: No Food Insecurity (03/14/2024)  Housing: Unknown (03/14/2024)  Transportation Needs: No Transportation Needs (03/14/2024)  Utilities: Not At Risk (03/14/2024)  Alcohol  Screen: Low Risk (01/12/2023)  Depression (PHQ2-9): Low Risk (03/14/2024)  Financial Resource Strain: Low Risk (10/24/2021)  Physical Activity: Insufficiently Active (03/14/2024)  Social Connections: Socially Isolated (03/14/2024)  Stress: No Stress Concern Present (03/14/2024)  Tobacco Use: Medium Risk (03/14/2024)  Health Literacy: Adequate Health Literacy (03/14/2024)   See flowsheets for full screening details  Depression Screen PHQ 2 & 9 Depression Scale- Over the past 2 weeks, how often have  you been bothered by any of the following problems? Little interest or pleasure in doing things: 0 Feeling down, depressed, or hopeless (PHQ Adolescent also includes...irritable): 0 PHQ-2 Total Score: 0 Trouble falling or staying asleep, or sleeping too much: 0 Feeling tired or having little energy: 0 Poor appetite or overeating (PHQ Adolescent also includes...weight loss): 0 Feeling bad about yourself - or that you are a failure or have let yourself or your family down: 1 (concerns about her balance) Trouble concentrating on things, such as reading the newspaper or watching television (PHQ Adolescent also includes...like school work): 0 Moving or speaking so slowly that  other people could have noticed. Or the opposite - being so fidgety or restless that you have been moving around a lot more than usual: 0 Thoughts that you would be better off dead, or of hurting yourself in some way: 0 PHQ-9 Total Score: 1 If you checked off any problems, how difficult have these problems made it for you to do your work, take care of things at home, or get along with other people?: Not difficult at all  Depression Treatment Depression Interventions/Treatment : [id number] Score <4 Follow-up Not Indicated     Goals Addressed               This Visit's Progress     Patient Stated (pt-stated)   On track     Continue current lifestyle/Maintained/2026 Like to garden a little more             Objective:    Today's Vitals   03/14/24 1027  BP: (!) 175/83  Pulse: 64  SpO2: 99%  Weight: 117 lb 12.8 oz (53.4 kg)  Height: 5' (1.524 m)   Body mass index is 23.01 kg/m.  Hearing/Vision screen Hearing Screening - Comments:: Wears hearing aides Vision Screening - Comments:: Wears eyeglasses/UTD/Dr. Valdemar Immunizations and Health Maintenance Health Maintenance  Topic Date Due   DTaP/Tdap/Td (2 - Td or Tdap) 02/23/2024   Medicare Annual Wellness (AWV)  03/14/2025   Pneumococcal Vaccine: 50+ Years   Completed   Influenza Vaccine  Completed   Bone Density Scan  Completed   Zoster Vaccines- Shingrix  Completed   Meningococcal B Vaccine  Aged Out   Mammogram  Discontinued   COVID-19 Vaccine  Discontinued        Assessment/Plan:  This is a routine wellness examination for [place].  Patient Care Team: Catherine Charlies LABOR, DO as PCP - General (Family Medicine) Lonni Slain, MD as PCP - Cardiology (Cardiology) Wendel Lurena POUR, MD as PCP - Structural Heart (Cardiology) Valdemar Rogue, MD as Consulting Physician (Ophthalmology) Lelon JONELLE Ferrari, MD as Consulting Physician (Ophthalmology) Tricia, Tawni CROME, MD as Referring Physician (Dermatology) Ishmael Slough, MD as Consulting Physician (Rheumatology)  I have personally reviewed and noted the following in the patients chart:   Medical and social history Use of alcohol , tobacco or illicit drugs  Current medications and supplements including opioid prescriptions. Functional ability and status Nutritional status Physical activity Advanced directives List of other physicians Hospitalizations, surgeries, and ER visits in previous 12 months Vitals Screenings to include cognitive, depression, and falls Referrals and appointments  No orders of the defined types were placed in this encounter.  In addition, I have reviewed and discussed with patient certain preventive protocols, quality metrics, and best practice recommendations. A written personalized care plan for preventive services as well as general preventive health recommendations were provided to patient.   Taylour Lietzke L Stark Aguinaga, CMA   03/14/2024   Return in 1 year (on 03/14/2025).  After Visit Summary: (MyChart) Due to this being a telephonic visit, the after visit summary with patients personalized plan was offered to patient via MyChart   Nurse Notes: Patient is due for a Tdap.  Patient is up to date on all other health maintenance with no concerns to address  today.  "

## 2024-03-19 ENCOUNTER — Other Ambulatory Visit: Payer: Self-pay

## 2024-03-19 ENCOUNTER — Encounter (INDEPENDENT_AMBULATORY_CARE_PROVIDER_SITE_OTHER): Payer: Self-pay | Admitting: Ophthalmology

## 2024-03-26 ENCOUNTER — Other Ambulatory Visit: Payer: Self-pay

## 2024-03-30 ENCOUNTER — Encounter (INDEPENDENT_AMBULATORY_CARE_PROVIDER_SITE_OTHER): Payer: Self-pay

## 2024-04-06 ENCOUNTER — Encounter (INDEPENDENT_AMBULATORY_CARE_PROVIDER_SITE_OTHER): Admitting: Ophthalmology

## 2024-07-12 ENCOUNTER — Other Ambulatory Visit (HOSPITAL_COMMUNITY)

## 2024-07-12 ENCOUNTER — Ambulatory Visit: Admitting: Physician Assistant

## 2025-03-20 ENCOUNTER — Ambulatory Visit
